# Patient Record
Sex: Male | Born: 1935 | Race: White | Hispanic: No | Smoking: Never smoker
Health system: Southern US, Community
[De-identification: ages and names within clinical notes are randomized; demographics above are authoritative.]

## PROBLEM LIST (undated history)

## (undated) DIAGNOSIS — D649 Anemia, unspecified: Secondary | ICD-10-CM

## (undated) DIAGNOSIS — I4891 Unspecified atrial fibrillation: Secondary | ICD-10-CM

## (undated) DIAGNOSIS — D696 Thrombocytopenia, unspecified: Secondary | ICD-10-CM

## (undated) DIAGNOSIS — I509 Heart failure, unspecified: Secondary | ICD-10-CM

## (undated) DIAGNOSIS — I2510 Atherosclerotic heart disease of native coronary artery without angina pectoris: Secondary | ICD-10-CM

## (undated) DIAGNOSIS — I219 Acute myocardial infarction, unspecified: Secondary | ICD-10-CM

## (undated) DIAGNOSIS — M25512 Pain in left shoulder: Secondary | ICD-10-CM

## (undated) DIAGNOSIS — R161 Splenomegaly, not elsewhere classified: Secondary | ICD-10-CM

## (undated) DIAGNOSIS — D631 Anemia in chronic kidney disease: Secondary | ICD-10-CM

## (undated) DIAGNOSIS — E669 Obesity, unspecified: Secondary | ICD-10-CM

## (undated) DIAGNOSIS — N189 Chronic kidney disease, unspecified: Secondary | ICD-10-CM

## (undated) DIAGNOSIS — I639 Cerebral infarction, unspecified: Secondary | ICD-10-CM

## (undated) DIAGNOSIS — I10 Essential (primary) hypertension: Secondary | ICD-10-CM

## (undated) DIAGNOSIS — E785 Hyperlipidemia, unspecified: Secondary | ICD-10-CM

## (undated) DIAGNOSIS — I350 Nonrheumatic aortic (valve) stenosis: Secondary | ICD-10-CM

## (undated) HISTORY — DX: Hyperlipidemia, unspecified: E78.5

## (undated) HISTORY — DX: Thrombocytopenia, unspecified: D69.6

## (undated) HISTORY — DX: Splenomegaly, not elsewhere classified: R16.1

## (undated) HISTORY — DX: Anemia in chronic kidney disease: D63.1

## (undated) HISTORY — DX: Obesity, unspecified: E66.9

## (undated) HISTORY — PX: OTHER SURGICAL HISTORY: SHX169

## (undated) HISTORY — PX: CORONARY ARTERY BYPASS GRAFT: SHX141

## (undated) HISTORY — DX: Heart failure, unspecified: I50.9

## (undated) HISTORY — DX: Cerebral infarction, unspecified: I63.9

## (undated) HISTORY — PX: APPENDECTOMY: SHX54

## (undated) HISTORY — DX: Acute myocardial infarction, unspecified: I21.9

## (undated) HISTORY — DX: Unspecified atrial fibrillation: I48.91

## (undated) HISTORY — DX: Essential (primary) hypertension: I10

## (undated) HISTORY — DX: Pain in left shoulder: M25.512

## (undated) HISTORY — DX: Chronic kidney disease, unspecified: N18.9

## (undated) HISTORY — PX: AORTIC VALVE REPLACEMENT: SHX41

## (undated) HISTORY — DX: Nonrheumatic aortic (valve) stenosis: I35.0

## (undated) HISTORY — DX: Anemia, unspecified: D64.9

## (undated) HISTORY — DX: Anemia in chronic kidney disease: N18.9

## (undated) HISTORY — DX: Atherosclerotic heart disease of native coronary artery without angina pectoris: I25.10

---

## 2004-06-18 ENCOUNTER — Emergency Department (HOSPITAL_COMMUNITY): Admission: EM | Admit: 2004-06-18 | Discharge: 2004-06-18 | Payer: Self-pay | Admitting: Emergency Medicine

## 2004-09-21 ENCOUNTER — Ambulatory Visit: Payer: Self-pay | Admitting: Family Medicine

## 2004-10-01 ENCOUNTER — Ambulatory Visit: Payer: Self-pay | Admitting: Family Medicine

## 2004-10-02 ENCOUNTER — Ambulatory Visit: Payer: Self-pay | Admitting: Family Medicine

## 2004-10-05 ENCOUNTER — Ambulatory Visit: Payer: Self-pay | Admitting: Family Medicine

## 2004-10-16 ENCOUNTER — Encounter: Admission: RE | Admit: 2004-10-16 | Discharge: 2004-10-16 | Payer: Self-pay | Admitting: Family Medicine

## 2005-05-16 ENCOUNTER — Ambulatory Visit: Payer: Self-pay | Admitting: Family Medicine

## 2005-07-05 ENCOUNTER — Inpatient Hospital Stay (HOSPITAL_COMMUNITY): Admission: AD | Admit: 2005-07-05 | Discharge: 2005-07-06 | Payer: Self-pay | Admitting: Internal Medicine

## 2005-07-05 ENCOUNTER — Ambulatory Visit: Payer: Self-pay | Admitting: Family Medicine

## 2005-07-06 ENCOUNTER — Ambulatory Visit: Payer: Self-pay | Admitting: Internal Medicine

## 2005-07-07 ENCOUNTER — Ambulatory Visit: Payer: Self-pay | Admitting: Cardiology

## 2005-07-10 ENCOUNTER — Ambulatory Visit: Payer: Self-pay | Admitting: Family Medicine

## 2005-07-17 ENCOUNTER — Ambulatory Visit: Payer: Self-pay

## 2005-08-06 ENCOUNTER — Ambulatory Visit (HOSPITAL_COMMUNITY): Admission: RE | Admit: 2005-08-06 | Discharge: 2005-08-06 | Payer: Self-pay | Admitting: Urology

## 2005-09-05 ENCOUNTER — Ambulatory Visit: Payer: Self-pay | Admitting: Gastroenterology

## 2005-09-16 ENCOUNTER — Ambulatory Visit: Payer: Self-pay | Admitting: Gastroenterology

## 2005-09-23 ENCOUNTER — Ambulatory Visit: Payer: Self-pay | Admitting: Gastroenterology

## 2005-09-23 ENCOUNTER — Encounter (INDEPENDENT_AMBULATORY_CARE_PROVIDER_SITE_OTHER): Payer: Self-pay | Admitting: *Deleted

## 2005-09-23 ENCOUNTER — Ambulatory Visit (HOSPITAL_COMMUNITY): Admission: RE | Admit: 2005-09-23 | Discharge: 2005-09-23 | Payer: Self-pay | Admitting: Gastroenterology

## 2005-11-13 ENCOUNTER — Ambulatory Visit: Payer: Self-pay | Admitting: Family Medicine

## 2006-03-25 ENCOUNTER — Ambulatory Visit: Payer: Self-pay | Admitting: Family Medicine

## 2006-04-28 ENCOUNTER — Ambulatory Visit: Payer: Self-pay | Admitting: Gastroenterology

## 2006-05-09 ENCOUNTER — Ambulatory Visit: Payer: Self-pay | Admitting: Gastroenterology

## 2006-05-09 ENCOUNTER — Encounter (INDEPENDENT_AMBULATORY_CARE_PROVIDER_SITE_OTHER): Payer: Self-pay | Admitting: *Deleted

## 2006-05-09 ENCOUNTER — Encounter: Payer: Self-pay | Admitting: Gastroenterology

## 2006-05-13 ENCOUNTER — Ambulatory Visit: Payer: Self-pay | Admitting: Cardiology

## 2006-05-16 ENCOUNTER — Ambulatory Visit: Payer: Self-pay

## 2006-05-16 ENCOUNTER — Encounter: Payer: Self-pay | Admitting: Internal Medicine

## 2006-05-17 ENCOUNTER — Emergency Department (HOSPITAL_COMMUNITY): Admission: EM | Admit: 2006-05-17 | Discharge: 2006-05-18 | Payer: Self-pay | Admitting: Emergency Medicine

## 2006-06-05 ENCOUNTER — Ambulatory Visit: Payer: Self-pay | Admitting: Family Medicine

## 2006-06-09 ENCOUNTER — Ambulatory Visit: Payer: Self-pay | Admitting: Family Medicine

## 2006-06-17 ENCOUNTER — Ambulatory Visit: Payer: Self-pay | Admitting: Family Medicine

## 2006-06-18 ENCOUNTER — Ambulatory Visit: Payer: Self-pay | Admitting: Family Medicine

## 2006-06-19 ENCOUNTER — Ambulatory Visit: Payer: Self-pay | Admitting: Family Medicine

## 2006-07-14 ENCOUNTER — Ambulatory Visit: Payer: Self-pay | Admitting: Family Medicine

## 2006-07-23 ENCOUNTER — Ambulatory Visit: Payer: Self-pay | Admitting: Internal Medicine

## 2006-08-20 ENCOUNTER — Ambulatory Visit: Payer: Self-pay | Admitting: Family Medicine

## 2006-10-28 ENCOUNTER — Ambulatory Visit: Payer: Self-pay

## 2006-10-28 ENCOUNTER — Ambulatory Visit: Payer: Self-pay | Admitting: Family Medicine

## 2006-10-28 LAB — CONVERTED CEMR LAB
Albumin: 4.1 g/dL (ref 3.5–5.2)
Alkaline Phosphatase: 78 units/L (ref 39–117)
BUN: 23 mg/dL (ref 6–23)
Basophils Absolute: 0 10*3/uL (ref 0.0–0.1)
Basophils Relative: 0.1 % (ref 0.0–1.0)
Calcium: 10 mg/dL (ref 8.4–10.5)
Chloride: 105 meq/L (ref 96–112)
Creatinine, Ser: 1.7 mg/dL — ABNORMAL HIGH (ref 0.4–1.5)
HCT: 39.8 % (ref 39.0–52.0)
HDL: 43.8 mg/dL (ref >39.0)
Hgb A1c MFr Bld: 5.9 % (ref 4.6–6.0)
LDL DIRECT: 102.8 mg/dL
MCHC: 33.8 g/dL (ref 30.0–36.0)
Monocytes Relative: 6.2 % (ref 3.0–11.0)
Neutro Abs: 7.8 10*3/uL — ABNORMAL HIGH (ref 1.4–7.7)
Platelets: 157 10*3/uL (ref 150–400)
Potassium: 5 meq/L (ref 3.5–5.1)
RBC: 4.33 M/uL (ref 4.22–5.81)
Sodium: 145 meq/L (ref 135–145)
TSH: 1.47 microintl units/mL (ref 0.35–5.50)
Total Bilirubin: 1.4 mg/dL — ABNORMAL HIGH (ref 0.3–1.2)
VLDL: 44 mg/dL — ABNORMAL HIGH (ref 0–40)

## 2006-10-30 ENCOUNTER — Ambulatory Visit: Payer: Self-pay | Admitting: Cardiology

## 2006-11-28 ENCOUNTER — Ambulatory Visit: Payer: Self-pay | Admitting: Family Medicine

## 2006-12-16 ENCOUNTER — Ambulatory Visit: Payer: Self-pay | Admitting: Cardiology

## 2006-12-24 ENCOUNTER — Ambulatory Visit: Payer: Self-pay | Admitting: Family Medicine

## 2006-12-24 LAB — CONVERTED CEMR LAB
Calcium: 9.5 mg/dL (ref 8.4–10.5)
Chloride: 101 meq/L (ref 96–112)
Creatinine, Ser: 1.5 mg/dL (ref 0.4–1.5)
GFR calc Af Amer: 60 mL/min
GFR calc non Af Amer: 49 mL/min
Glucose, Bld: 123 mg/dL — ABNORMAL HIGH (ref 70–99)
Potassium: 4.5 meq/L (ref 3.5–5.1)
Sodium: 139 meq/L (ref 135–145)

## 2007-01-26 ENCOUNTER — Ambulatory Visit: Payer: Self-pay | Admitting: Family Medicine

## 2007-04-13 DIAGNOSIS — Z860100 Personal history of colon polyps, unspecified: Secondary | ICD-10-CM | POA: Insufficient documentation

## 2007-04-13 DIAGNOSIS — Z951 Presence of aortocoronary bypass graft: Secondary | ICD-10-CM

## 2007-04-13 DIAGNOSIS — I080 Rheumatic disorders of both mitral and aortic valves: Secondary | ICD-10-CM

## 2007-04-13 DIAGNOSIS — M159 Polyosteoarthritis, unspecified: Secondary | ICD-10-CM

## 2007-04-13 DIAGNOSIS — M1990 Unspecified osteoarthritis, unspecified site: Secondary | ICD-10-CM

## 2007-04-13 DIAGNOSIS — I10 Essential (primary) hypertension: Secondary | ICD-10-CM | POA: Insufficient documentation

## 2007-04-13 DIAGNOSIS — E785 Hyperlipidemia, unspecified: Secondary | ICD-10-CM | POA: Insufficient documentation

## 2007-04-13 HISTORY — DX: Polyosteoarthritis, unspecified: M15.9

## 2007-06-17 ENCOUNTER — Ambulatory Visit: Payer: Self-pay | Admitting: Family Medicine

## 2007-06-17 DIAGNOSIS — F331 Major depressive disorder, recurrent, moderate: Secondary | ICD-10-CM | POA: Insufficient documentation

## 2007-06-17 LAB — CONVERTED CEMR LAB
Bilirubin Urine: NEGATIVE
Glucose, Urine, Semiquant: NEGATIVE
Nitrite: NEGATIVE
Protein, U semiquant: NEGATIVE
Specific Gravity, Urine: 1.01
Urobilinogen, UA: 0.2
WBC Urine, dipstick: NEGATIVE
pH: 6

## 2007-07-08 ENCOUNTER — Ambulatory Visit: Payer: Self-pay | Admitting: Family Medicine

## 2007-07-15 ENCOUNTER — Ambulatory Visit: Payer: Self-pay | Admitting: Cardiology

## 2007-07-15 ENCOUNTER — Telehealth (INDEPENDENT_AMBULATORY_CARE_PROVIDER_SITE_OTHER): Payer: Self-pay | Admitting: *Deleted

## 2007-07-15 ENCOUNTER — Ambulatory Visit: Payer: Self-pay

## 2007-09-01 ENCOUNTER — Telehealth: Payer: Self-pay | Admitting: Family Medicine

## 2008-01-04 ENCOUNTER — Telehealth: Payer: Self-pay | Admitting: Family Medicine

## 2008-03-07 ENCOUNTER — Ambulatory Visit: Payer: Self-pay | Admitting: Family Medicine

## 2008-03-07 DIAGNOSIS — E1129 Type 2 diabetes mellitus with other diabetic kidney complication: Secondary | ICD-10-CM

## 2008-03-17 ENCOUNTER — Encounter: Payer: Self-pay | Admitting: Family Medicine

## 2008-04-04 ENCOUNTER — Ambulatory Visit: Payer: Self-pay | Admitting: Cardiology

## 2008-04-11 ENCOUNTER — Telehealth: Payer: Self-pay | Admitting: Family Medicine

## 2008-04-12 ENCOUNTER — Encounter: Payer: Self-pay | Admitting: Cardiology

## 2008-04-12 ENCOUNTER — Ambulatory Visit: Payer: Self-pay

## 2008-04-20 ENCOUNTER — Ambulatory Visit: Payer: Self-pay | Admitting: Family Medicine

## 2008-04-20 DIAGNOSIS — M109 Gout, unspecified: Secondary | ICD-10-CM

## 2008-04-20 DIAGNOSIS — I509 Heart failure, unspecified: Secondary | ICD-10-CM | POA: Insufficient documentation

## 2008-05-24 ENCOUNTER — Ambulatory Visit: Payer: Self-pay | Admitting: Family Medicine

## 2008-05-26 ENCOUNTER — Telehealth: Payer: Self-pay | Admitting: Family Medicine

## 2008-06-07 ENCOUNTER — Ambulatory Visit: Payer: Self-pay | Admitting: Cardiology

## 2008-06-28 ENCOUNTER — Telehealth: Payer: Self-pay | Admitting: Family Medicine

## 2008-07-29 ENCOUNTER — Encounter: Payer: Self-pay | Admitting: Family Medicine

## 2008-07-29 DIAGNOSIS — M129 Arthropathy, unspecified: Secondary | ICD-10-CM | POA: Insufficient documentation

## 2008-08-25 ENCOUNTER — Encounter: Payer: Self-pay | Admitting: Family Medicine

## 2008-09-08 ENCOUNTER — Encounter: Payer: Self-pay | Admitting: Family Medicine

## 2008-09-12 ENCOUNTER — Ambulatory Visit: Payer: Self-pay | Admitting: Family Medicine

## 2008-09-12 DIAGNOSIS — N259 Disorder resulting from impaired renal tubular function, unspecified: Secondary | ICD-10-CM | POA: Insufficient documentation

## 2008-09-13 LAB — CONVERTED CEMR LAB
ALT: 36 units/L (ref 0–53)
AST: 30 units/L (ref 0–37)
Albumin: 4.3 g/dL (ref 3.5–5.2)
BUN: 28 mg/dL — ABNORMAL HIGH (ref 6–23)
Bilirubin, Direct: 0.1 mg/dL (ref 0.0–0.3)
CO2: 26 meq/L (ref 19–32)
Creatinine, Ser: 1.6 mg/dL — ABNORMAL HIGH (ref 0.4–1.5)
Direct LDL: 93.8 mg/dL
GFR calc Af Amer: 55 mL/min
GFR calc non Af Amer: 46 mL/min
Hgb A1c MFr Bld: 6.7 % — ABNORMAL HIGH (ref 4.6–6.0)
Potassium: 4.4 meq/L (ref 3.5–5.1)
Total CHOL/HDL Ratio: 4.6
Triglycerides: 294 mg/dL (ref 0–149)

## 2008-09-16 ENCOUNTER — Telehealth: Payer: Self-pay | Admitting: Family Medicine

## 2008-09-20 ENCOUNTER — Encounter: Payer: Self-pay | Admitting: Cardiology

## 2008-09-20 ENCOUNTER — Ambulatory Visit: Payer: Self-pay

## 2008-09-23 ENCOUNTER — Ambulatory Visit: Payer: Self-pay | Admitting: Cardiology

## 2008-10-03 ENCOUNTER — Ambulatory Visit: Payer: Self-pay | Admitting: Family Medicine

## 2008-11-10 ENCOUNTER — Encounter: Payer: Self-pay | Admitting: Family Medicine

## 2008-12-13 ENCOUNTER — Telehealth: Payer: Self-pay | Admitting: Family Medicine

## 2009-01-02 ENCOUNTER — Ambulatory Visit: Payer: Self-pay | Admitting: Family Medicine

## 2009-01-05 ENCOUNTER — Telehealth: Payer: Self-pay | Admitting: Family Medicine

## 2009-01-05 LAB — CONVERTED CEMR LAB
ALT: 44 units/L (ref 0–53)
Alkaline Phosphatase: 38 units/L — ABNORMAL LOW (ref 39–117)
Basophils Absolute: 0 10*3/uL (ref 0.0–0.1)
Basophils Relative: 0 % (ref 0.0–3.0)
Bilirubin, Direct: 0.2 mg/dL (ref 0.0–0.3)
CO2: 31 meq/L (ref 19–32)
Chloride: 100 meq/L (ref 96–112)
Cholesterol: 196 mg/dL (ref 0–200)
Creatinine, Ser: 2.1 mg/dL — ABNORMAL HIGH (ref 0.4–1.5)
HCT: 38.4 % — ABNORMAL LOW (ref 39.0–52.0)
HDL: 42.3 mg/dL (ref >39.0)
Hemoglobin: 13.3 g/dL (ref 13.0–17.0)
Hgb A1c MFr Bld: 6.2 % — ABNORMAL HIGH (ref 4.6–6.0)
Lymphocytes Relative: 15 % (ref 12.0–46.0)
Neutro Abs: 5 10*3/uL (ref 1.4–7.7)
Platelets: 152 10*3/uL (ref 150–400)
Total Bilirubin: 1 mg/dL (ref 0.3–1.2)
Total CHOL/HDL Ratio: 4.6
Triglycerides: 258 mg/dL (ref 0–149)
VLDL: 52 mg/dL — ABNORMAL HIGH (ref 0–40)
WBC: 6.8 10*3/uL (ref 4.5–10.5)

## 2009-03-07 ENCOUNTER — Ambulatory Visit: Payer: Self-pay | Admitting: Family Medicine

## 2009-03-22 ENCOUNTER — Encounter (INDEPENDENT_AMBULATORY_CARE_PROVIDER_SITE_OTHER): Payer: Self-pay | Admitting: *Deleted

## 2009-03-28 ENCOUNTER — Ambulatory Visit: Payer: Self-pay | Admitting: Family Medicine

## 2009-03-28 DIAGNOSIS — H6120 Impacted cerumen, unspecified ear: Secondary | ICD-10-CM

## 2009-03-28 LAB — CONVERTED CEMR LAB
Specific Gravity, Urine: 1.02
pH: 6

## 2009-03-29 ENCOUNTER — Encounter: Payer: Self-pay | Admitting: Family Medicine

## 2009-04-04 ENCOUNTER — Telehealth: Payer: Self-pay | Admitting: Family Medicine

## 2009-05-01 ENCOUNTER — Ambulatory Visit: Payer: Self-pay | Admitting: Family Medicine

## 2009-05-01 LAB — CONVERTED CEMR LAB
Bilirubin Urine: NEGATIVE
Glucose, Urine, Semiquant: NEGATIVE
Ketones, urine, test strip: NEGATIVE
Specific Gravity, Urine: 1.015
Urobilinogen, UA: 0.2
WBC Urine, dipstick: NEGATIVE
pH: 6

## 2009-05-02 ENCOUNTER — Telehealth: Payer: Self-pay | Admitting: Cardiology

## 2009-05-02 DIAGNOSIS — I359 Nonrheumatic aortic valve disorder, unspecified: Secondary | ICD-10-CM | POA: Insufficient documentation

## 2009-05-04 ENCOUNTER — Encounter: Payer: Self-pay | Admitting: Cardiology

## 2009-05-04 ENCOUNTER — Telehealth: Payer: Self-pay | Admitting: Cardiology

## 2009-05-04 ENCOUNTER — Ambulatory Visit: Payer: Self-pay

## 2009-05-09 ENCOUNTER — Ambulatory Visit: Payer: Self-pay | Admitting: Family Medicine

## 2009-05-23 ENCOUNTER — Ambulatory Visit: Payer: Self-pay | Admitting: Family Medicine

## 2009-05-23 LAB — CONVERTED CEMR LAB
Bilirubin Urine: NEGATIVE
Nitrite: NEGATIVE
Specific Gravity, Urine: 1.01
Urobilinogen, UA: 0.2
WBC Urine, dipstick: NEGATIVE
pH: 6

## 2009-05-24 ENCOUNTER — Encounter: Payer: Self-pay | Admitting: Family Medicine

## 2009-06-06 ENCOUNTER — Ambulatory Visit: Payer: Self-pay

## 2009-06-06 ENCOUNTER — Encounter: Payer: Self-pay | Admitting: Cardiology

## 2009-07-24 ENCOUNTER — Telehealth: Payer: Self-pay | Admitting: Family Medicine

## 2009-09-20 ENCOUNTER — Telehealth: Payer: Self-pay | Admitting: Family Medicine

## 2009-10-12 ENCOUNTER — Encounter: Payer: Self-pay | Admitting: Family Medicine

## 2009-10-16 ENCOUNTER — Ambulatory Visit: Payer: Self-pay | Admitting: Family Medicine

## 2009-10-16 DIAGNOSIS — J309 Allergic rhinitis, unspecified: Secondary | ICD-10-CM | POA: Insufficient documentation

## 2009-10-16 DIAGNOSIS — K589 Irritable bowel syndrome without diarrhea: Secondary | ICD-10-CM | POA: Insufficient documentation

## 2009-10-21 DIAGNOSIS — D696 Thrombocytopenia, unspecified: Secondary | ICD-10-CM

## 2009-10-21 HISTORY — DX: Thrombocytopenia, unspecified: D69.6

## 2009-10-26 ENCOUNTER — Encounter: Payer: Self-pay | Admitting: Cardiology

## 2009-10-26 ENCOUNTER — Encounter: Payer: Self-pay | Admitting: Family Medicine

## 2009-11-02 ENCOUNTER — Ambulatory Visit: Payer: Self-pay | Admitting: Family Medicine

## 2009-11-20 ENCOUNTER — Ambulatory Visit: Payer: Self-pay | Admitting: Family Medicine

## 2009-11-20 LAB — CONVERTED CEMR LAB: Blood Glucose, Fingerstick: 478

## 2009-11-21 ENCOUNTER — Telehealth (INDEPENDENT_AMBULATORY_CARE_PROVIDER_SITE_OTHER): Payer: Self-pay | Admitting: *Deleted

## 2009-11-22 ENCOUNTER — Encounter: Admission: RE | Admit: 2009-11-22 | Discharge: 2009-11-22 | Payer: Self-pay | Admitting: Family Medicine

## 2009-11-22 ENCOUNTER — Encounter: Payer: Self-pay | Admitting: Family Medicine

## 2009-11-24 ENCOUNTER — Telehealth: Payer: Self-pay | Admitting: Gastroenterology

## 2009-11-27 ENCOUNTER — Ambulatory Visit: Payer: Self-pay | Admitting: Family Medicine

## 2009-12-07 ENCOUNTER — Ambulatory Visit: Payer: Self-pay | Admitting: Family Medicine

## 2009-12-07 LAB — CONVERTED CEMR LAB
Ketones, urine, test strip: NEGATIVE
Urobilinogen, UA: 0.2

## 2009-12-08 ENCOUNTER — Telehealth: Payer: Self-pay | Admitting: Family Medicine

## 2009-12-11 LAB — CONVERTED CEMR LAB
ALT: 22 units/L (ref 0–53)
Albumin: 3.8 g/dL (ref 3.5–5.2)
Alkaline Phosphatase: 41 units/L (ref 39–117)
Basophils Absolute: 0 10*3/uL (ref 0.0–0.1)
Bilirubin, Direct: 0.3 mg/dL (ref 0.0–0.3)
Calcium: 9.2 mg/dL (ref 8.4–10.5)
Creatinine, Ser: 2.3 mg/dL — ABNORMAL HIGH (ref 0.4–1.5)
HCT: 32.7 % — ABNORMAL LOW (ref 39.0–52.0)
Hgb A1c MFr Bld: 10.1 % — ABNORMAL HIGH (ref 4.6–6.5)
Lymphocytes Relative: 11.8 % — ABNORMAL LOW (ref 12.0–46.0)
Lymphs Abs: 0.8 10*3/uL (ref 0.7–4.0)
Microalb, Ur: 15.1 mg/dL — ABNORMAL HIGH (ref 0.0–1.9)
Monocytes Absolute: 0.5 10*3/uL (ref 0.1–1.0)
Neutrophils Relative %: 77.6 % — ABNORMAL HIGH (ref 43.0–77.0)
RDW: 15.3 % — ABNORMAL HIGH (ref 11.5–14.6)
TSH: 1.88 microintl units/mL (ref 0.35–5.50)
Total CHOL/HDL Ratio: 3
Triglycerides: 167 mg/dL — ABNORMAL HIGH (ref 0.0–149.0)
VLDL: 33.4 mg/dL (ref 0.0–40.0)
WBC: 6.8 10*3/uL (ref 4.5–10.5)

## 2009-12-13 ENCOUNTER — Ambulatory Visit: Payer: Self-pay | Admitting: Family Medicine

## 2009-12-14 ENCOUNTER — Ambulatory Visit: Payer: Self-pay | Admitting: Internal Medicine

## 2009-12-14 ENCOUNTER — Inpatient Hospital Stay (HOSPITAL_COMMUNITY): Admission: EM | Admit: 2009-12-14 | Discharge: 2009-12-22 | Payer: Self-pay | Admitting: Emergency Medicine

## 2009-12-14 ENCOUNTER — Telehealth: Payer: Self-pay | Admitting: Family Medicine

## 2009-12-14 ENCOUNTER — Ambulatory Visit: Payer: Self-pay | Admitting: Cardiovascular Disease

## 2009-12-14 ENCOUNTER — Encounter (INDEPENDENT_AMBULATORY_CARE_PROVIDER_SITE_OTHER): Payer: Self-pay | Admitting: *Deleted

## 2009-12-15 ENCOUNTER — Encounter (INDEPENDENT_AMBULATORY_CARE_PROVIDER_SITE_OTHER): Payer: Self-pay | Admitting: Internal Medicine

## 2009-12-18 ENCOUNTER — Encounter (INDEPENDENT_AMBULATORY_CARE_PROVIDER_SITE_OTHER): Payer: Self-pay | Admitting: *Deleted

## 2009-12-26 ENCOUNTER — Ambulatory Visit: Payer: Self-pay | Admitting: Family Medicine

## 2009-12-26 DIAGNOSIS — K759 Inflammatory liver disease, unspecified: Secondary | ICD-10-CM | POA: Insufficient documentation

## 2009-12-27 LAB — CONVERTED CEMR LAB
AST: 69 units/L — ABNORMAL HIGH (ref 0–37)
BUN: 22 mg/dL (ref 6–23)
CO2: 23 meq/L (ref 19–32)
Chloride: 110 meq/L (ref 96–112)
Eosinophils Relative: 2.8 % (ref 0.0–5.0)
Glucose, Bld: 153 mg/dL — ABNORMAL HIGH (ref 70–99)
HCT: 35.8 % — ABNORMAL LOW (ref 39.0–52.0)
Hemoglobin: 11.9 g/dL — ABNORMAL LOW (ref 13.0–17.0)
Lymphocytes Relative: 12.8 % (ref 12.0–46.0)
Lymphs Abs: 0.9 10*3/uL (ref 0.7–4.0)
MCV: 95.8 fL (ref 78.0–100.0)
Monocytes Relative: 7.9 % (ref 3.0–12.0)
Neutro Abs: 5.1 10*3/uL (ref 1.4–7.7)
Neutrophils Relative %: 76.3 % (ref 43.0–77.0)
Platelets: 241 10*3/uL (ref 150.0–400.0)
RDW: 18.3 % — ABNORMAL HIGH (ref 11.5–14.6)
Sodium: 140 meq/L (ref 135–145)
Total Bilirubin: 1.6 mg/dL — ABNORMAL HIGH (ref 0.3–1.2)
Total Protein: 7 g/dL (ref 6.0–8.3)

## 2010-01-05 ENCOUNTER — Ambulatory Visit: Payer: Self-pay | Admitting: Family Medicine

## 2010-01-05 ENCOUNTER — Telehealth: Payer: Self-pay | Admitting: Family Medicine

## 2010-01-08 ENCOUNTER — Emergency Department (HOSPITAL_COMMUNITY): Admission: EM | Admit: 2010-01-08 | Discharge: 2010-01-08 | Payer: Self-pay | Admitting: Emergency Medicine

## 2010-01-08 ENCOUNTER — Telehealth: Payer: Self-pay | Admitting: Family Medicine

## 2010-01-09 ENCOUNTER — Encounter: Payer: Self-pay | Admitting: Family Medicine

## 2010-01-10 ENCOUNTER — Ambulatory Visit: Payer: Self-pay | Admitting: Internal Medicine

## 2010-01-10 ENCOUNTER — Telehealth: Payer: Self-pay | Admitting: Gastroenterology

## 2010-01-10 ENCOUNTER — Telehealth: Payer: Self-pay | Admitting: Family Medicine

## 2010-01-10 DIAGNOSIS — R42 Dizziness and giddiness: Secondary | ICD-10-CM | POA: Insufficient documentation

## 2010-01-11 LAB — CONVERTED CEMR LAB
ALT: 23 units/L (ref 0–53)
AST: 45 units/L — ABNORMAL HIGH (ref 0–37)
Albumin: 3.6 g/dL (ref 3.5–5.2)
Ammonia: 19 umol/L (ref 11–35)
Creatinine, Ser: 1.9 mg/dL — ABNORMAL HIGH (ref 0.4–1.5)
GFR calc non Af Amer: 37.28 mL/min (ref >60)
Prothrombin Time: 14.9 s — ABNORMAL HIGH (ref 9.1–11.7)
Sodium: 139 meq/L (ref 135–145)

## 2010-01-12 ENCOUNTER — Encounter: Payer: Self-pay | Admitting: Cardiology

## 2010-01-15 ENCOUNTER — Telehealth: Payer: Self-pay | Admitting: Family Medicine

## 2010-01-15 ENCOUNTER — Ambulatory Visit: Payer: Self-pay | Admitting: Cardiology

## 2010-01-30 ENCOUNTER — Ambulatory Visit: Payer: Self-pay | Admitting: Gastroenterology

## 2010-01-31 LAB — CONVERTED CEMR LAB
AST: 26 units/L (ref 0–37)
Albumin: 3.6 g/dL (ref 3.5–5.2)
Bilirubin, Direct: 0.6 mg/dL — ABNORMAL HIGH (ref 0.0–0.3)
Chloride: 99 meq/L (ref 96–112)
Creatinine, Ser: 1.3 mg/dL (ref 0.4–1.5)
GFR calc non Af Amer: 57.75 mL/min (ref >60)
Potassium: 3.4 meq/L — ABNORMAL LOW (ref 3.5–5.1)
Sed Rate: 19 mm/hr (ref 0–22)
Total Protein: 6.4 g/dL (ref 6.0–8.3)

## 2010-02-06 ENCOUNTER — Ambulatory Visit (HOSPITAL_COMMUNITY): Admission: RE | Admit: 2010-02-06 | Discharge: 2010-02-06 | Payer: Self-pay | Admitting: Gastroenterology

## 2010-02-07 ENCOUNTER — Encounter (INDEPENDENT_AMBULATORY_CARE_PROVIDER_SITE_OTHER): Payer: Self-pay | Admitting: *Deleted

## 2010-02-19 ENCOUNTER — Telehealth: Payer: Self-pay | Admitting: Family Medicine

## 2010-02-20 ENCOUNTER — Ambulatory Visit: Payer: Self-pay | Admitting: Gastroenterology

## 2010-02-26 ENCOUNTER — Encounter (INDEPENDENT_AMBULATORY_CARE_PROVIDER_SITE_OTHER): Payer: Self-pay | Admitting: *Deleted

## 2010-02-26 ENCOUNTER — Ambulatory Visit: Payer: Self-pay | Admitting: Cardiology

## 2010-03-07 ENCOUNTER — Telehealth: Payer: Self-pay | Admitting: Cardiology

## 2010-03-07 ENCOUNTER — Ambulatory Visit: Payer: Self-pay | Admitting: Family Medicine

## 2010-03-12 ENCOUNTER — Telehealth: Payer: Self-pay | Admitting: Gastroenterology

## 2010-03-16 ENCOUNTER — Telehealth: Payer: Self-pay | Admitting: Family Medicine

## 2010-03-18 ENCOUNTER — Ambulatory Visit: Payer: Self-pay | Admitting: Cardiovascular Disease

## 2010-03-18 ENCOUNTER — Inpatient Hospital Stay (HOSPITAL_COMMUNITY): Admission: EM | Admit: 2010-03-18 | Discharge: 2010-04-24 | Payer: Self-pay | Admitting: Emergency Medicine

## 2010-03-18 ENCOUNTER — Encounter (INDEPENDENT_AMBULATORY_CARE_PROVIDER_SITE_OTHER): Payer: Self-pay | Admitting: Internal Medicine

## 2010-03-20 ENCOUNTER — Encounter: Payer: Self-pay | Admitting: Cardiology

## 2010-03-20 ENCOUNTER — Telehealth (INDEPENDENT_AMBULATORY_CARE_PROVIDER_SITE_OTHER): Payer: Self-pay | Admitting: *Deleted

## 2010-03-20 ENCOUNTER — Telehealth: Payer: Self-pay | Admitting: Family Medicine

## 2010-03-21 ENCOUNTER — Encounter (INDEPENDENT_AMBULATORY_CARE_PROVIDER_SITE_OTHER): Payer: Self-pay | Admitting: Nephrology

## 2010-03-21 ENCOUNTER — Telehealth: Payer: Self-pay | Admitting: Cardiology

## 2010-03-26 ENCOUNTER — Ambulatory Visit: Payer: Self-pay | Admitting: Thoracic Surgery (Cardiothoracic Vascular Surgery)

## 2010-04-03 ENCOUNTER — Ambulatory Visit: Payer: Self-pay | Admitting: Hematology and Oncology

## 2010-04-04 ENCOUNTER — Encounter: Payer: Self-pay | Admitting: Cardiology

## 2010-04-04 ENCOUNTER — Encounter: Payer: Self-pay | Admitting: Thoracic Surgery (Cardiothoracic Vascular Surgery)

## 2010-04-10 ENCOUNTER — Encounter: Payer: Self-pay | Admitting: Vascular Surgery

## 2010-04-11 ENCOUNTER — Ambulatory Visit: Payer: Self-pay | Admitting: Physical Medicine & Rehabilitation

## 2010-04-18 ENCOUNTER — Ambulatory Visit: Payer: Self-pay | Admitting: Vascular Surgery

## 2010-04-19 ENCOUNTER — Encounter: Payer: Self-pay | Admitting: Vascular Surgery

## 2010-05-14 ENCOUNTER — Ambulatory Visit: Payer: Self-pay | Admitting: Thoracic Surgery (Cardiothoracic Vascular Surgery)

## 2010-05-14 ENCOUNTER — Encounter
Admission: RE | Admit: 2010-05-14 | Discharge: 2010-05-14 | Payer: Self-pay | Admitting: Thoracic Surgery (Cardiothoracic Vascular Surgery)

## 2010-05-14 ENCOUNTER — Encounter: Payer: Self-pay | Admitting: Cardiology

## 2010-05-15 ENCOUNTER — Encounter: Payer: Self-pay | Admitting: Cardiology

## 2010-05-15 ENCOUNTER — Ambulatory Visit: Payer: Self-pay | Admitting: Cardiology

## 2010-05-17 ENCOUNTER — Telehealth: Payer: Self-pay | Admitting: Cardiology

## 2010-05-17 ENCOUNTER — Encounter: Payer: Self-pay | Admitting: Cardiology

## 2010-05-17 ENCOUNTER — Encounter: Payer: Self-pay | Admitting: Family Medicine

## 2010-05-18 ENCOUNTER — Encounter: Payer: Self-pay | Admitting: Cardiology

## 2010-05-18 ENCOUNTER — Encounter: Payer: Self-pay | Admitting: Family Medicine

## 2010-05-19 ENCOUNTER — Telehealth: Payer: Self-pay | Admitting: Cardiology

## 2010-05-21 ENCOUNTER — Encounter: Payer: Self-pay | Admitting: Cardiovascular Disease

## 2010-05-21 ENCOUNTER — Encounter: Payer: Self-pay | Admitting: Cardiology

## 2010-05-21 ENCOUNTER — Telehealth: Payer: Self-pay | Admitting: Family Medicine

## 2010-05-21 ENCOUNTER — Telehealth: Payer: Self-pay | Admitting: Cardiology

## 2010-05-21 DIAGNOSIS — I639 Cerebral infarction, unspecified: Secondary | ICD-10-CM

## 2010-05-21 HISTORY — DX: Cerebral infarction, unspecified: I63.9

## 2010-05-21 LAB — CONVERTED CEMR LAB: Prothrombin Time: 25.9 s

## 2010-05-24 ENCOUNTER — Ambulatory Visit: Payer: Self-pay | Admitting: Family Medicine

## 2010-05-25 ENCOUNTER — Telehealth: Payer: Self-pay | Admitting: Family Medicine

## 2010-05-28 ENCOUNTER — Encounter: Payer: Self-pay | Admitting: Family Medicine

## 2010-05-28 ENCOUNTER — Encounter: Payer: Self-pay | Admitting: Cardiology

## 2010-05-29 ENCOUNTER — Telehealth (INDEPENDENT_AMBULATORY_CARE_PROVIDER_SITE_OTHER): Payer: Self-pay | Admitting: *Deleted

## 2010-05-29 ENCOUNTER — Telehealth: Payer: Self-pay | Admitting: Family Medicine

## 2010-05-30 ENCOUNTER — Encounter: Payer: Self-pay | Admitting: Cardiology

## 2010-05-30 LAB — CONVERTED CEMR LAB: Prothrombin Time: 36.9 s

## 2010-05-31 ENCOUNTER — Encounter: Payer: Self-pay | Admitting: Physician Assistant

## 2010-06-01 ENCOUNTER — Telehealth: Payer: Self-pay | Admitting: Cardiology

## 2010-06-02 ENCOUNTER — Encounter: Payer: Self-pay | Admitting: Cardiology

## 2010-06-05 ENCOUNTER — Encounter: Payer: Self-pay | Admitting: Physician Assistant

## 2010-06-05 ENCOUNTER — Ambulatory Visit: Payer: Self-pay | Admitting: Cardiology

## 2010-06-06 ENCOUNTER — Encounter: Payer: Self-pay | Admitting: Internal Medicine

## 2010-06-07 ENCOUNTER — Ambulatory Visit: Payer: Self-pay | Admitting: Vascular Surgery

## 2010-06-08 ENCOUNTER — Encounter: Payer: Self-pay | Admitting: Cardiology

## 2010-06-12 ENCOUNTER — Telehealth: Payer: Self-pay | Admitting: Family Medicine

## 2010-06-13 ENCOUNTER — Encounter: Payer: Self-pay | Admitting: Family Medicine

## 2010-06-13 ENCOUNTER — Encounter: Payer: Self-pay | Admitting: Cardiology

## 2010-06-14 ENCOUNTER — Telehealth (INDEPENDENT_AMBULATORY_CARE_PROVIDER_SITE_OTHER): Payer: Self-pay | Admitting: *Deleted

## 2010-06-14 ENCOUNTER — Ambulatory Visit: Payer: Self-pay | Admitting: Internal Medicine

## 2010-06-14 ENCOUNTER — Inpatient Hospital Stay (HOSPITAL_COMMUNITY): Admission: EM | Admit: 2010-06-14 | Discharge: 2010-06-16 | Payer: Self-pay | Admitting: Emergency Medicine

## 2010-06-15 ENCOUNTER — Encounter (INDEPENDENT_AMBULATORY_CARE_PROVIDER_SITE_OTHER): Payer: Self-pay | Admitting: Family Medicine

## 2010-06-15 ENCOUNTER — Ambulatory Visit: Payer: Self-pay | Admitting: Vascular Surgery

## 2010-06-18 ENCOUNTER — Encounter: Payer: Self-pay | Admitting: Cardiology

## 2010-06-18 ENCOUNTER — Telehealth: Payer: Self-pay | Admitting: Cardiology

## 2010-06-19 ENCOUNTER — Telehealth: Payer: Self-pay | Admitting: Family Medicine

## 2010-06-19 ENCOUNTER — Ambulatory Visit: Payer: Self-pay | Admitting: Cardiovascular Disease

## 2010-06-19 ENCOUNTER — Telehealth: Payer: Self-pay | Admitting: Cardiology

## 2010-06-19 LAB — CONVERTED CEMR LAB: POC INR: 4.1

## 2010-06-20 ENCOUNTER — Encounter: Payer: Self-pay | Admitting: Cardiology

## 2010-06-20 ENCOUNTER — Telehealth: Payer: Self-pay | Admitting: Family Medicine

## 2010-06-22 ENCOUNTER — Encounter: Payer: Self-pay | Admitting: Cardiology

## 2010-06-26 ENCOUNTER — Telehealth: Payer: Self-pay | Admitting: Family Medicine

## 2010-06-28 ENCOUNTER — Ambulatory Visit: Payer: Self-pay | Admitting: Internal Medicine

## 2010-07-05 ENCOUNTER — Encounter: Payer: Self-pay | Admitting: Family Medicine

## 2010-07-06 ENCOUNTER — Encounter: Payer: Self-pay | Admitting: Cardiology

## 2010-07-10 ENCOUNTER — Ambulatory Visit: Payer: Self-pay | Admitting: Cardiology

## 2010-07-12 ENCOUNTER — Encounter
Admission: RE | Admit: 2010-07-12 | Discharge: 2010-07-12 | Payer: Self-pay | Admitting: Thoracic Surgery (Cardiothoracic Vascular Surgery)

## 2010-07-12 ENCOUNTER — Encounter: Payer: Self-pay | Admitting: Family Medicine

## 2010-07-12 ENCOUNTER — Ambulatory Visit: Payer: Self-pay | Admitting: Thoracic Surgery (Cardiothoracic Vascular Surgery)

## 2010-07-12 ENCOUNTER — Encounter: Payer: Self-pay | Admitting: Cardiology

## 2010-07-17 ENCOUNTER — Telehealth: Payer: Self-pay | Admitting: Family Medicine

## 2010-07-17 ENCOUNTER — Ambulatory Visit: Payer: Self-pay | Admitting: Family Medicine

## 2010-07-18 ENCOUNTER — Encounter: Payer: Self-pay | Admitting: Family Medicine

## 2010-07-18 ENCOUNTER — Encounter: Payer: Self-pay | Admitting: Cardiology

## 2010-07-19 ENCOUNTER — Encounter: Payer: Self-pay | Admitting: Family Medicine

## 2010-07-20 ENCOUNTER — Encounter: Payer: Self-pay | Admitting: Cardiology

## 2010-07-24 ENCOUNTER — Telehealth (INDEPENDENT_AMBULATORY_CARE_PROVIDER_SITE_OTHER): Payer: Self-pay | Admitting: *Deleted

## 2010-07-26 ENCOUNTER — Ambulatory Visit: Payer: Self-pay | Admitting: Cardiology

## 2010-07-26 ENCOUNTER — Ambulatory Visit: Payer: Self-pay | Admitting: Internal Medicine

## 2010-08-02 ENCOUNTER — Ambulatory Visit: Payer: Self-pay | Admitting: Internal Medicine

## 2010-08-02 ENCOUNTER — Ambulatory Visit: Payer: Self-pay | Admitting: Vascular Surgery

## 2010-08-02 ENCOUNTER — Encounter (HOSPITAL_COMMUNITY)
Admission: RE | Admit: 2010-08-02 | Discharge: 2010-10-31 | Payer: Self-pay | Source: Home / Self Care | Attending: Cardiology | Admitting: Cardiology

## 2010-08-07 ENCOUNTER — Ambulatory Visit: Payer: Self-pay | Admitting: Cardiology

## 2010-08-07 ENCOUNTER — Telehealth (INDEPENDENT_AMBULATORY_CARE_PROVIDER_SITE_OTHER): Payer: Self-pay | Admitting: *Deleted

## 2010-08-07 LAB — CONVERTED CEMR LAB: POC INR: 1.6

## 2010-08-14 ENCOUNTER — Ambulatory Visit: Payer: Self-pay | Admitting: Cardiology

## 2010-08-14 LAB — CONVERTED CEMR LAB: POC INR: 1.8

## 2010-08-15 ENCOUNTER — Encounter: Payer: Self-pay | Admitting: Internal Medicine

## 2010-08-15 ENCOUNTER — Encounter: Payer: Self-pay | Admitting: Cardiology

## 2010-08-15 LAB — CONVERTED CEMR LAB: Prothrombin Time: 21.5 s

## 2010-08-17 ENCOUNTER — Encounter: Payer: Self-pay | Admitting: Internal Medicine

## 2010-08-17 ENCOUNTER — Encounter (INDEPENDENT_AMBULATORY_CARE_PROVIDER_SITE_OTHER): Payer: Self-pay | Admitting: *Deleted

## 2010-08-20 ENCOUNTER — Encounter: Payer: Self-pay | Admitting: Cardiology

## 2010-08-21 ENCOUNTER — Ambulatory Visit: Payer: Self-pay | Admitting: Cardiology

## 2010-08-21 ENCOUNTER — Telehealth: Payer: Self-pay | Admitting: Cardiology

## 2010-08-21 ENCOUNTER — Ambulatory Visit: Payer: Self-pay | Admitting: Cardiovascular Disease

## 2010-08-21 LAB — CONVERTED CEMR LAB: POC INR: 2.8

## 2010-08-22 ENCOUNTER — Telehealth: Payer: Self-pay | Admitting: Cardiology

## 2010-08-23 ENCOUNTER — Encounter: Payer: Self-pay | Admitting: Cardiology

## 2010-08-23 ENCOUNTER — Encounter: Payer: Self-pay | Admitting: Family Medicine

## 2010-08-28 ENCOUNTER — Ambulatory Visit: Payer: Self-pay | Admitting: Internal Medicine

## 2010-08-28 ENCOUNTER — Telehealth: Payer: Self-pay | Admitting: Cardiology

## 2010-08-28 ENCOUNTER — Encounter: Payer: Self-pay | Admitting: Cardiology

## 2010-08-29 ENCOUNTER — Encounter: Payer: Self-pay | Admitting: Cardiology

## 2010-09-04 ENCOUNTER — Ambulatory Visit: Payer: Self-pay | Admitting: Cardiovascular Disease

## 2010-09-04 ENCOUNTER — Ambulatory Visit: Payer: Self-pay | Admitting: Thoracic Surgery (Cardiothoracic Vascular Surgery)

## 2010-09-04 ENCOUNTER — Encounter: Payer: Self-pay | Admitting: Family Medicine

## 2010-09-04 ENCOUNTER — Encounter
Admission: RE | Admit: 2010-09-04 | Discharge: 2010-09-04 | Payer: Self-pay | Admitting: Thoracic Surgery (Cardiothoracic Vascular Surgery)

## 2010-09-04 LAB — CONVERTED CEMR LAB: POC INR: 3.1

## 2010-09-05 ENCOUNTER — Encounter: Payer: Self-pay | Admitting: Cardiology

## 2010-09-06 ENCOUNTER — Telehealth: Payer: Self-pay | Admitting: Cardiology

## 2010-09-06 ENCOUNTER — Ambulatory Visit (HOSPITAL_COMMUNITY): Admission: RE | Admit: 2010-09-06 | Discharge: 2010-09-06 | Payer: Self-pay | Admitting: Cardiology

## 2010-09-07 ENCOUNTER — Telehealth (INDEPENDENT_AMBULATORY_CARE_PROVIDER_SITE_OTHER): Payer: Self-pay | Admitting: Pharmacist

## 2010-09-12 ENCOUNTER — Encounter: Payer: Self-pay | Admitting: Cardiology

## 2010-09-17 ENCOUNTER — Ambulatory Visit: Payer: Self-pay | Admitting: Internal Medicine

## 2010-09-20 ENCOUNTER — Ambulatory Visit: Payer: Self-pay | Admitting: Vascular Surgery

## 2010-09-21 ENCOUNTER — Telehealth: Payer: Self-pay | Admitting: Cardiology

## 2010-09-21 ENCOUNTER — Encounter: Payer: Self-pay | Admitting: Cardiology

## 2010-09-24 ENCOUNTER — Telehealth: Payer: Self-pay | Admitting: Family Medicine

## 2010-09-24 ENCOUNTER — Ambulatory Visit: Payer: Self-pay | Admitting: Family Medicine

## 2010-09-25 ENCOUNTER — Telehealth: Payer: Self-pay | Admitting: Cardiology

## 2010-09-26 ENCOUNTER — Telehealth: Payer: Self-pay | Admitting: Family Medicine

## 2010-09-28 ENCOUNTER — Encounter: Payer: Self-pay | Admitting: Cardiology

## 2010-10-02 ENCOUNTER — Ambulatory Visit: Payer: Self-pay | Admitting: Internal Medicine

## 2010-10-03 ENCOUNTER — Encounter: Payer: Self-pay | Admitting: Cardiology

## 2010-10-04 ENCOUNTER — Ambulatory Visit (HOSPITAL_COMMUNITY)
Admission: RE | Admit: 2010-10-04 | Discharge: 2010-10-04 | Payer: Self-pay | Source: Home / Self Care | Attending: Cardiology | Admitting: Cardiology

## 2010-10-08 ENCOUNTER — Ambulatory Visit: Payer: Self-pay | Admitting: Cardiology

## 2010-10-11 ENCOUNTER — Encounter: Payer: Self-pay | Admitting: Family Medicine

## 2010-10-12 ENCOUNTER — Telehealth: Payer: Self-pay | Admitting: Family Medicine

## 2010-10-16 ENCOUNTER — Encounter: Payer: Self-pay | Admitting: Cardiology

## 2010-10-16 ENCOUNTER — Ambulatory Visit: Payer: Self-pay | Admitting: Internal Medicine

## 2010-10-16 LAB — CONVERTED CEMR LAB: POC INR: 2.5

## 2010-10-17 ENCOUNTER — Encounter: Payer: Self-pay | Admitting: Cardiology

## 2010-10-19 ENCOUNTER — Telehealth: Payer: Self-pay | Admitting: Family Medicine

## 2010-10-21 DIAGNOSIS — R161 Splenomegaly, not elsewhere classified: Secondary | ICD-10-CM

## 2010-10-21 HISTORY — DX: Splenomegaly, not elsewhere classified: R16.1

## 2010-10-22 IMAGING — CR DG CHEST 1V PORT
1 series · 1 of 1 positions shown · non-contrast
Comparison: 04/10/2010

CLINICAL DATA: Reevaluate congestive heart failure.

PORTABLE CHEST - 1 VIEW

[view not recorded]
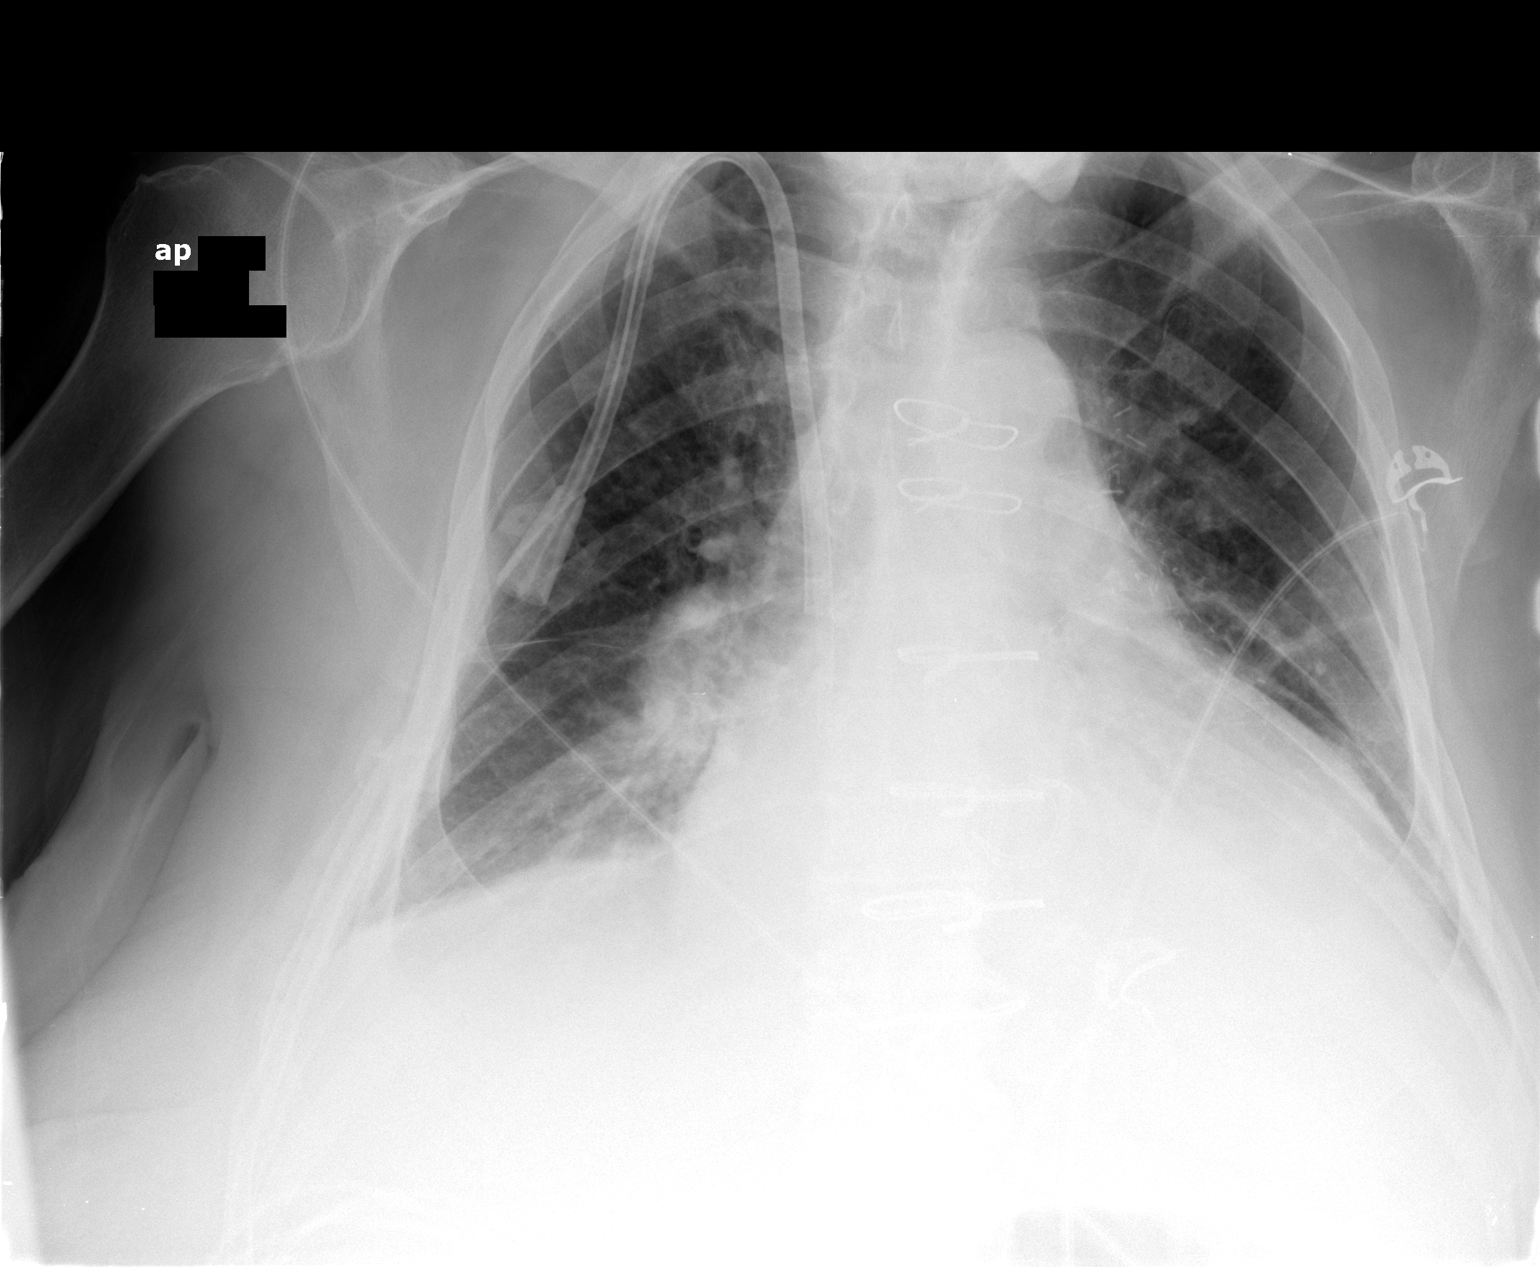

[1 of 1 positions shown; findings below may reference images not displayed]

FINDINGS: A dialysis catheter is unchanged in position with tips at
the low SVC and superior caval/atrial junction.

The chin overlies the medial apex minimally.  Moderate
cardiomegaly.  Probable small bilateral pleural effusions. No
pneumothorax.  Similar mild pulmonary venous congestion.  Minimal
worsening in right base atelectasis.  Persistent left lower lobe
airspace disease.
IMPRESSION: 1.  Cardiomegaly with similar pulmonary venous congestion.
2.  Similar left base air space disease, likely atelectasis.
3.  Slight increase in right base atelectasis with persistent small
bilateral pleural effusions.

## 2010-10-23 IMAGING — CR DG CHEST 1V PORT
1 series · 1 of 1 positions shown · non-contrast
Comparison: 04/20/2010

CLINICAL DATA: Shortness of breath

PORTABLE CHEST - 1 VIEW

[view not recorded]
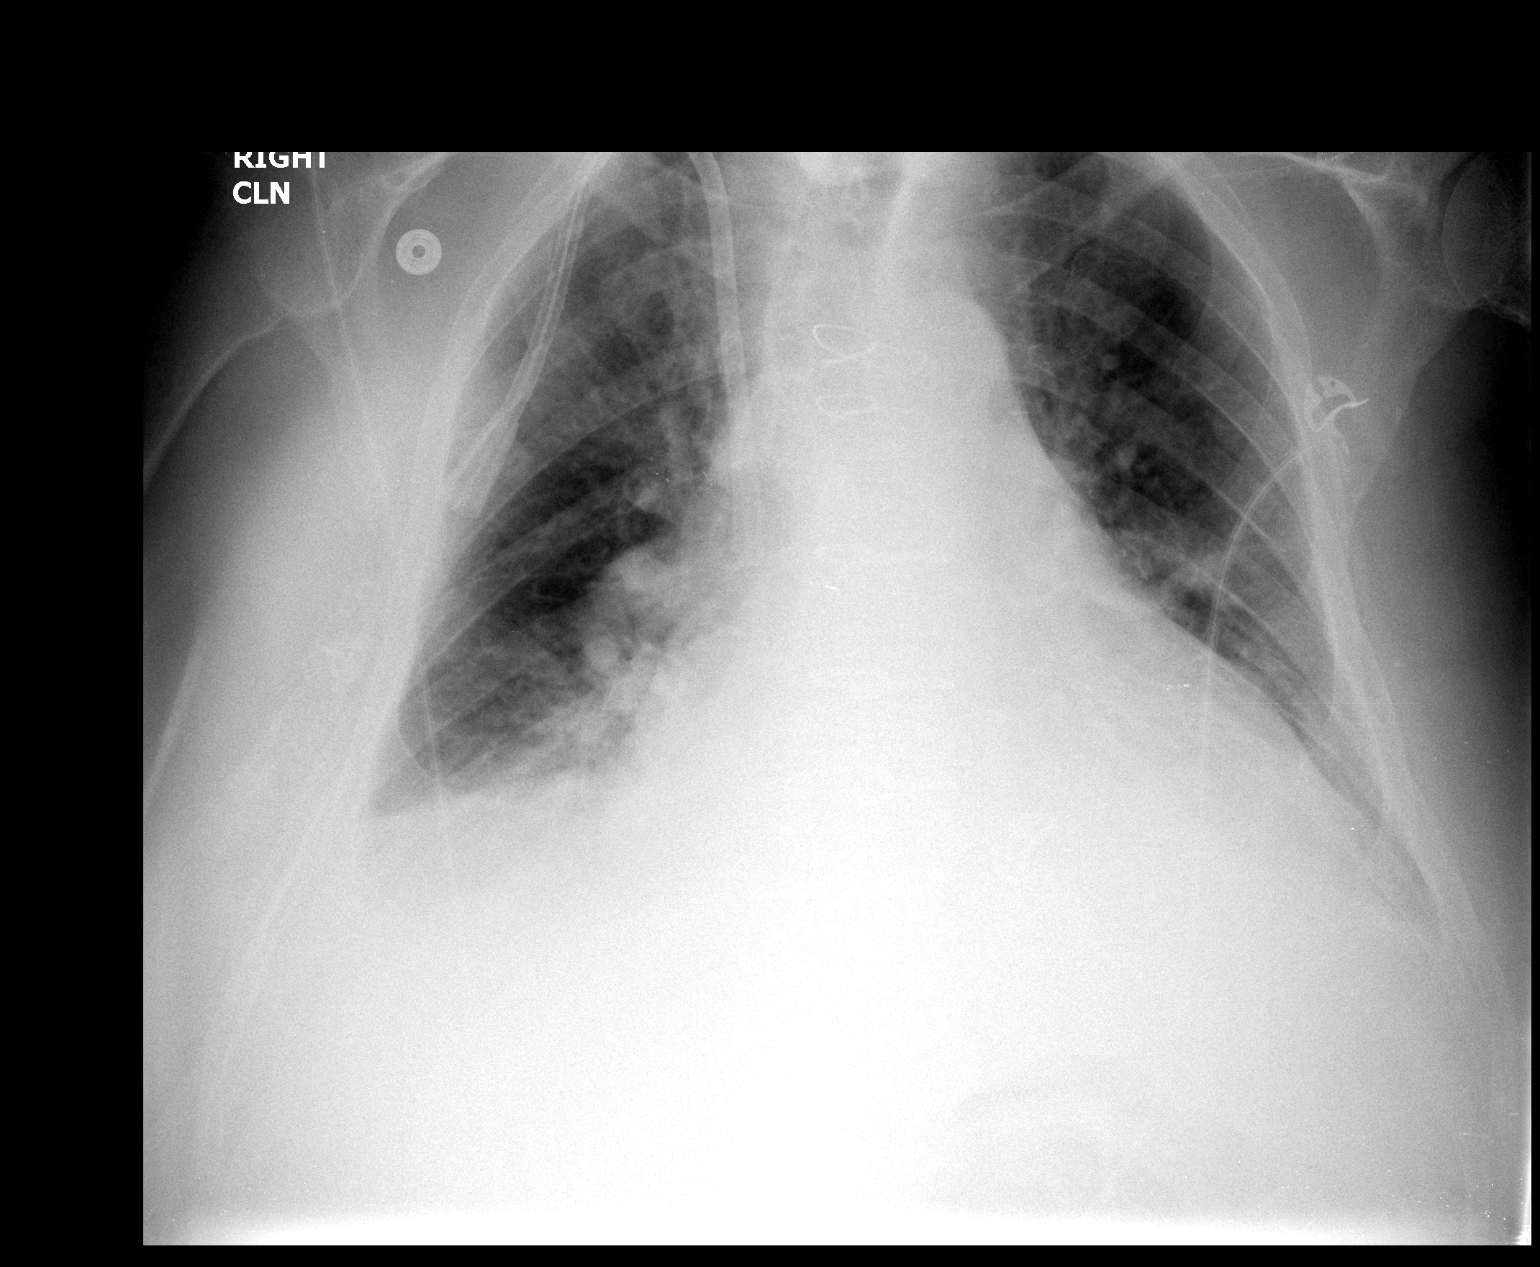

[1 of 1 positions shown; findings below may reference images not displayed]

FINDINGS: Right dual lumen IJ approach dialysis catheter tips
terminate over the cavoatrial junction and right atrium,
respectively.  Enlargement of the cardiomediastinal silhouette is
stable with minimal if any interstitial edema and trace effusions.
Evidence of median sternotomy noted.
IMPRESSION: Stable findings as above.

## 2010-10-25 ENCOUNTER — Ambulatory Visit: Admission: RE | Admit: 2010-10-25 | Discharge: 2010-10-25 | Payer: Self-pay | Source: Home / Self Care

## 2010-10-29 ENCOUNTER — Telehealth: Payer: Self-pay | Admitting: Cardiology

## 2010-10-29 ENCOUNTER — Encounter: Payer: Self-pay | Admitting: Cardiology

## 2010-10-31 ENCOUNTER — Telehealth: Payer: Self-pay | Admitting: Cardiology

## 2010-11-01 ENCOUNTER — Ambulatory Visit: Admit: 2010-11-01 | Payer: Self-pay

## 2010-11-01 ENCOUNTER — Inpatient Hospital Stay (HOSPITAL_COMMUNITY)
Admission: RE | Admit: 2010-11-01 | Discharge: 2010-11-04 | Payer: Self-pay | Source: Home / Self Care | Attending: Cardiology | Admitting: Cardiology

## 2010-11-02 ENCOUNTER — Encounter: Payer: Self-pay | Admitting: Internal Medicine

## 2010-11-05 LAB — CBC
HCT: 37.5 % — ABNORMAL LOW (ref 39.0–52.0)
HCT: 36.9 % — ABNORMAL LOW (ref 39.0–52.0)
HCT: 38.7 % — ABNORMAL LOW (ref 39.0–52.0)
HCT: 33.7 % — ABNORMAL LOW (ref 39.0–52.0)
Hemoglobin: 12.5 g/dL — ABNORMAL LOW (ref 13.0–17.0)
Hemoglobin: 12 g/dL — ABNORMAL LOW (ref 13.0–17.0)
Hemoglobin: 13.2 g/dL (ref 13.0–17.0)
Hemoglobin: 11.1 g/dL — ABNORMAL LOW (ref 13.0–17.0)
MCH: 32.7 pg (ref 26.0–34.0)
MCH: 32 pg (ref 26.0–34.0)
MCH: 33.6 pg (ref 26.0–34.0)
MCH: 32.4 pg (ref 26.0–34.0)
MCHC: 33.3 g/dL (ref 30.0–36.0)
MCHC: 32.5 g/dL (ref 30.0–36.0)
MCHC: 34.1 g/dL (ref 30.0–36.0)
MCHC: 32.9 g/dL (ref 30.0–36.0)
MCV: 98.2 fL (ref 78.0–100.0)
MCV: 98.4 fL (ref 78.0–100.0)
MCV: 98.5 fL (ref 78.0–100.0)
MCV: 98.3 fL (ref 78.0–100.0)
Platelets: 100 10*3/uL — ABNORMAL LOW (ref 150–400)
Platelets: 74 10*3/uL — ABNORMAL LOW (ref 150–400)
Platelets: 71 10*3/uL — ABNORMAL LOW (ref 150–400)
Platelets: 66 10*3/uL — ABNORMAL LOW (ref 150–400)
RBC: 3.82 MIL/uL — ABNORMAL LOW (ref 4.22–5.81)
RBC: 3.75 MIL/uL — ABNORMAL LOW (ref 4.22–5.81)
RBC: 3.93 MIL/uL — ABNORMAL LOW (ref 4.22–5.81)
RBC: 3.43 MIL/uL — ABNORMAL LOW (ref 4.22–5.81)
RDW: 16.6 % — ABNORMAL HIGH (ref 11.5–15.5)
RDW: 16.8 % — ABNORMAL HIGH (ref 11.5–15.5)
RDW: 17.2 % — ABNORMAL HIGH (ref 11.5–15.5)
RDW: 17 % — ABNORMAL HIGH (ref 11.5–15.5)
WBC: 6.4 10*3/uL (ref 4.0–10.5)
WBC: 6 10*3/uL (ref 4.0–10.5)
WBC: 6.9 10*3/uL (ref 4.0–10.5)
WBC: 6.1 10*3/uL (ref 4.0–10.5)

## 2010-11-05 LAB — HEPARIN LEVEL (UNFRACTIONATED)
Heparin Unfractionated: 0.12 IU/mL — ABNORMAL LOW (ref 0.30–0.70)
Heparin Unfractionated: 0.13 IU/mL — ABNORMAL LOW (ref 0.30–0.70)
Heparin Unfractionated: 0.15 IU/mL — ABNORMAL LOW (ref 0.30–0.70)
Heparin Unfractionated: 0.1 IU/mL — ABNORMAL LOW (ref 0.30–0.70)
Heparin Unfractionated: 0.58 IU/mL (ref 0.30–0.70)
Heparin Unfractionated: 0.37 IU/mL (ref 0.30–0.70)

## 2010-11-05 LAB — RENAL FUNCTION PANEL
Albumin: 3.8 g/dL (ref 3.5–5.2)
BUN: 43 mg/dL — ABNORMAL HIGH (ref 6–23)
CO2: 27 mEq/L (ref 19–32)
Calcium: 8.7 mg/dL (ref 8.4–10.5)
Chloride: 97 mEq/L (ref 96–112)
Creatinine, Ser: 4.53 mg/dL — ABNORMAL HIGH (ref 0.4–1.5)
GFR calc Af Amer: 16 mL/min — ABNORMAL LOW (ref >60)
GFR calc non Af Amer: 13 mL/min — ABNORMAL LOW (ref >60)
Glucose, Bld: 185 mg/dL — ABNORMAL HIGH (ref 70–99)
Phosphorus: 5.3 mg/dL — ABNORMAL HIGH (ref 2.3–4.6)
Potassium: 4.2 mEq/L (ref 3.5–5.1)
Sodium: 135 mEq/L (ref 135–145)

## 2010-11-05 LAB — GLUCOSE, CAPILLARY
Glucose-Capillary: 269 mg/dL — ABNORMAL HIGH (ref 70–99)
Glucose-Capillary: 127 mg/dL — ABNORMAL HIGH (ref 70–99)
Glucose-Capillary: 143 mg/dL — ABNORMAL HIGH (ref 70–99)
Glucose-Capillary: 147 mg/dL — ABNORMAL HIGH (ref 70–99)
Glucose-Capillary: 157 mg/dL — ABNORMAL HIGH (ref 70–99)
Glucose-Capillary: 94 mg/dL (ref 70–99)
Glucose-Capillary: 139 mg/dL — ABNORMAL HIGH (ref 70–99)
Glucose-Capillary: 114 mg/dL — ABNORMAL HIGH (ref 70–99)
Glucose-Capillary: 172 mg/dL — ABNORMAL HIGH (ref 70–99)
Glucose-Capillary: 125 mg/dL — ABNORMAL HIGH (ref 70–99)
Glucose-Capillary: 120 mg/dL — ABNORMAL HIGH (ref 70–99)
Glucose-Capillary: 126 mg/dL — ABNORMAL HIGH (ref 70–99)

## 2010-11-05 LAB — PROTIME-INR
INR: 1.83 — ABNORMAL HIGH (ref 0.00–1.49)
INR: 1.95 — ABNORMAL HIGH (ref 0.00–1.49)
INR: 1.87 — ABNORMAL HIGH (ref 0.00–1.49)
INR: 2.49 — ABNORMAL HIGH (ref 0.00–1.49)
Prothrombin Time: 21.3 seconds — ABNORMAL HIGH (ref 11.6–15.2)
Prothrombin Time: 22.4 seconds — ABNORMAL HIGH (ref 11.6–15.2)
Prothrombin Time: 21.7 seconds — ABNORMAL HIGH (ref 11.6–15.2)
Prothrombin Time: 27 seconds — ABNORMAL HIGH (ref 11.6–15.2)

## 2010-11-05 LAB — POCT I-STAT 4, (NA,K, GLUC, HGB,HCT)
Glucose, Bld: 152 mg/dL — ABNORMAL HIGH (ref 70–99)
HCT: 39 % (ref 39.0–52.0)
Hemoglobin: 13.3 g/dL (ref 13.0–17.0)
Potassium: 3.9 mEq/L (ref 3.5–5.1)
Sodium: 138 mEq/L (ref 135–145)

## 2010-11-05 LAB — APTT: aPTT: 38 seconds — ABNORMAL HIGH (ref 24–37)

## 2010-11-05 LAB — TSH: TSH: 1.555 u[IU]/mL (ref 0.350–4.500)

## 2010-11-05 LAB — HEMOGLOBIN A1C
Hgb A1c MFr Bld: 5.5 % (ref <5.7)
Mean Plasma Glucose: 111 mg/dL (ref <117)

## 2010-11-08 ENCOUNTER — Ambulatory Visit: Admission: RE | Admit: 2010-11-08 | Discharge: 2010-11-08 | Payer: Self-pay | Source: Home / Self Care

## 2010-11-08 ENCOUNTER — Telehealth (INDEPENDENT_AMBULATORY_CARE_PROVIDER_SITE_OTHER): Payer: Self-pay | Admitting: *Deleted

## 2010-11-08 LAB — CONVERTED CEMR LAB: POC INR: 3.7

## 2010-11-11 NOTE — Procedures (Signed)
  NAME:  [name] Brown, [name] Brown NO.:  [id number]  MEDICAL RECORD NO.:  [id number]          PATIENT TYPE:  INP  LOCATION:  2029                         FACILITY:  [facility]  PHYSICIAN:  Shaune Pascal. Bensimhon, MDDATE OF BIRTH:  April 04, 1936  DATE OF PROCEDURE: DATE OF DISCHARGE:                           CARDIAC CATHETERIZATION   PROCEDURE:  Direct current cardioversion.  INDICATIONS:  Symptomatic atrial fibrillation.  DESCRIPTION OF PROCEDURE:  The risks and indication were explained. Consent was signed and placed in the chart.  On arrival to the endoscopy suite, his INR was 1.95 with a slightly subtherapeutic heparin level.  He was thus given 1500 units of systemic heparin and his heparin rate was increased prior to the procedure.  He then underwent a successful TEE, which showed an EF of 45-50% with severe left atrial enlargement.  The left atrial appendage had some spontaneous echo contrast, but no formed clot.  After appropriate sedation, he then underwent synchronized 150-joule biphasic shock with prompt conversion to sinus bradycardia.  There were no apparent complications.     Shaune Pascal. Bensimhon, MD     DRB/MEDQ  D:  11/02/2010  T:  11/02/2010  Job:  [id number]  Electronically Signed by Glori Bickers MD on 11/11/2010 07:13:31 PM

## 2010-11-12 NOTE — Discharge Summary (Signed)
NAME:  [name] Brown, [name] Brown NO.:  [id number]  MEDICAL RECORD NO.:  [id number]          PATIENT TYPE:  INP  LOCATION:  2029                         FACILITY:  [facility]  PHYSICIAN:  [name] Bamberg. Johnsie Cancel, MD, FACCDATE OF BIRTH:  October 05, 1936  DATE OF ADMISSION:  11/01/2010 DATE OF DISCHARGE:  11/04/2010                              DISCHARGE SUMMARY   DISCHARGE DIAGNOSES: 1. Atrial fibrillation status post transesophageal echocardiography     cardioversion with successful restoration of normal sinus rhythm on     November 02, 2010.     a.     Continued on Coumadin. 2. Coronary artery disease status post coronary artery bypass graft in     June 2011 with left internal mammary artery to the left anterior     descending, saphenous vein graft to the obtuse marginal, saphenous     vein graft to the posterior descending artery. 3. Status post aortic valve replacement at the same time as bypass     surgery with pericardial tissue valve, size 25. 4. End-stage renal disease, on hemodialysis.     a.     Status post right brachiocephalic atrioventricular fistula      in July 2011 as well as right internal jugular hemodialysis      catheter in May 2011.     b.     Nonhemorrhagic subcortical/cortical cerebrovascular accident      in August 2011. 5. Chronic anticoagulation with Coumadin. 6. History of thrombocytopenia. 7. Gout. 8. Chronic systolic congestive heart failure with volume management     with dialysis with ejection fraction of 45% to 50% by     transesophageal echocardiography on November 02, 2010, which was     actually improved from his prior ejection fraction of 30% by     transesophageal echocardiography in June 2011. 9. Subclinical hypothyroidism. 10.Obesity, status post significant weight loss. 11.Secondary hyperparathyroidism. 12.Hyperlipidemia. 13.Diabetes mellitus. 14.Idiopathic hepatitis in February 2011. 15.Non-ST-elevation myocardial infarction type 2 in February  2011.  HOSPITAL COURSE:  [name] Brown is a 75 year old gentleman with a prior history of coronary artery disease status post CABG who was found to have atrial fibrillation in August 2011 at the time of his CVA.  [name] Brown followed him closely after the admission, and the plan was to cardiovert him once his Coumadin level was therapeutic for 4 weeks.  His Coumadin had been repeatedly subtherapeutic and the procedure was not performed.  He had 3 weeks of therapeutic Coumadin levels and came to the hospital today for cardioversion; however, Coumadin was subtherapeutic at 1.8.  The decision was made to admit the patient and start him on heparin until his INR is greater than 2 and then proceed with TEE cardioversion.  His cardioversion was performed by [name] Brown on November 02, 2010, which successfully restored the patient to sinus bradycardia.  There were no complications.  Prior to cardioversion, he had a TEE which showed an EF of 45% to 50% with severe left atrial enlargement.  The left atrial appendage showed somespontaneous echo contrast but no formed clot.  The patient tolerated the procedure well.  He did have some bradycardia  so his beta-blocker was cut down.  His digoxin was discontinued.  On day of discharge, his heart rate is still low, so Dr. Johnsie Brown has elected to discontinue his Toprol. His INR is therapeutic today at 2.49.  I discussed with Pharmacy his Coumadin dosing being subtherapeutic.  Their recommendations are reflected in his medication reconciliation form.  Dr. Johnsie Brown has seen and examined him today and feels he is stable for discharge.  DISCHARGE LABORATORY DATA:  WBC 6.1, hemoglobin 11.1, hematocrit 33.7, platelet count of 66,000, INR is 2.49.  STUDIES:  TEE/cardioversion, please see above for report and full report for details.  DISCHARGE MEDICATIONS: 1. Allopurinol 100 mg daily, reduced per pharmacy recommendation     secondary to renal dysfunction. 2.  Coumadin 2 mg Tuesday, Thursday, Saturday, Sunday, and 4 mg Monday,     Wednesday, Friday. 3. Amiodarone 200 mg daily. 4. Colchicine 0.6 mg t.i.d. p.r.n. 5. Lantus 10-30 units subcutaneously daily. 6. Levothyroxine 25 mcg 1 tablet daily before breakfast. 7. Omega-3-acid ethyl esters 1 g one capsule daily. 8. Oxycodone 5 mg q.4 h. p.r.n. 9. Zocor 20 mg at bedtime. 10.Tramadol 50 mg 1-2 tablets q.4 h. p.r.n. pain. 11.Vol-Care 1 tablet daily. 12.Xanax 0.5 mg half tablet q.8 h. p.r.n.  Please note, the patient's metoprolol and digoxin were discontinued this admission secondary to bradycardia.  DISPOSITION:  [name] Brown was discharged in stable condition to home. He is instructed to increase activity slowly, follow a low-sodium, heart- healthy diabetic diet.  He will follow with [name] Dopp, PA-C, at [facility] in 2-3 weeks and our office while will call him with that appointment.  He will have his INR checked on Thursday per Dr. Johnsie Brown and our office will call him with that appointment as well.  He is to follow up with dialysis on November 05, 2010, at [facility]_ Clinic as previously scheduled.  DURATION OF DISCHARGE ENCOUNTER:  Greater than 30 minutes including physician and PA time.     [name] Brown, P.A.C.   ______________________________ [name] Bamberg Johnsie Cancel, MD, [facility]    DD/MEDQ  D:  11/04/2010  T:  11/05/2010  Job:  [id number]  cc:   Annie Main A. Sarajane Jews, MD Minus Breeding, MD, [facility].  Electronically Signed by Jenkins Rouge MD [facility] on 11/06/2010 01:01:05 PM Electronically Signed by Melina Copa  on 11/12/2010 10:33:04 AM

## 2010-11-13 ENCOUNTER — Telehealth: Payer: Self-pay | Admitting: Cardiology

## 2010-11-14 ENCOUNTER — Encounter (INDEPENDENT_AMBULATORY_CARE_PROVIDER_SITE_OTHER): Payer: Self-pay | Admitting: Pharmacist

## 2010-11-15 ENCOUNTER — Ambulatory Visit: Admission: RE | Admit: 2010-11-15 | Discharge: 2010-11-15 | Payer: Self-pay | Source: Home / Self Care

## 2010-11-15 LAB — CONVERTED CEMR LAB: POC INR: 3

## 2010-11-15 NOTE — H&P (Addendum)
NAME:  [name] Brown, [name] Brown NO.:  [id number]  MEDICAL RECORD NO.:  [id number]          PATIENT TYPE:  INP  LOCATION:  2029                         FACILITY:  [facility]  PHYSICIAN:  Minus Breeding, MD, FACCDATE OF BIRTH:  Jan 14, 1936  DATE OF ADMISSION:  11/01/2010 DATE OF DISCHARGE:                             HISTORY & PHYSICAL   PRIMARY CARE PHYSICIAN:  [name] Main A. Sarajane Jews, MD  NEPHROLOGIST:  At [facility].  CARDIOLOGIST:  Minus Breeding, MD, [facility]  CHIEF COMPLAINT:  Atrial fibrillation.  HISTORY OF PRESENT ILLNESS:  [name] Brown is a 75 year old male with a history of coronary artery disease and end-stage renal disease on hemodialysis.  He was found to have atrial fibrillation in August 2011, at the time he had a CVA.  [name] Brown has followed him closely, and plans have been made in the past to cardiovert him once his Coumadin was therapeutic for 4 weeks.  His Coumadin has been repeatedly subtherapeutic, and the procedure was not performed.  He has 3 weeks of therapeutic Coumadin levels and came to the hospital today for cardioversion; however, his Coumadin was subtherapeutic at 1.8.  He is being admitted for heparin until his INR is greater than 2.0 on Coumadin.  A TEE cardioversion is planned for tomorrow.  [name] Brown has no symptoms directly attributable to the atrial fibrillation.  He has a great deal of fatigue and listlessness after dialysis.  His appetite is poor and, especially on dialysis days, does not eat very much.  His wife states that he eats healthy meals when he eats, but the patient admits that he rarely eats more than twice a day.  He uses Nepro occasionally as a meal supplement but not that often because of the cost.  He never gets palpitations, syncope, or presyncope.  He tries to be compliant with diabetic/renal dietary restrictions. Currently, he is resting comfortably and asymptomatic.  PAST MEDICAL HISTORY: 1. Status  post aortocoronary bypass surgery in June 2011 with LIMA to     LAD, SVG to OM, SVG to PDA. 2. Status post aortic valve replacement at the time of his bypass     surgery with a pericardial tissue valve, size 25. 3. Status post right brachiocephalic AV fistula in July 2011 as well     as a right IJ hemodialysis catheter in May 2011. 4. End-stage renal disease, on hemodialysis. 5. Nonhemorrhagic subcortical/cortical cerebrovascular accident in     August 2011. 6. Chronic anticoagulation with Coumadin. 7. History of hypokalemia and thrombocytopenia. 8. Gout. 9. Chronic systolic CHF with volume management by dialysis, EF 30% by     intraoperative TEE in June 2011. 10.Subclinical hypothyroidism per the discharge summary in August     2011. 11.History of obesity, status post significant weight loss. 12.Secondary hypoparathyroidism secondary to hypercalcemia. 13.Hyperlipidemia. 14.Diabetes. 15.History of idiopathic hepatitis in February 2011. 16.History of non-ST-segment elevation MI, type 2, in February 2011.PAST SURGICAL HISTORY:  He is status post bypass surgery with aortic valve replacement, as well as cardiac catheterization, right brachiocephalic AV fistula, cholecystectomy, and appendectomy.  ALLERGIES:  PENICILLIN.  CURRENT MEDICATIONS: 1. Flonase 2 sprays daily. 2. Lantus  20 units nightly. 3. Colcrys 0.6 mg p.r.n. 4. Amiodarone 200 mg a day. 5. Levothroid 25 mcg daily. 6. Toprol-XL 50 mg daily. 7. Vol-Care daily. 8. Ultram 50 mg p.r.n. 9. Coumadin 2 mg as directed. 10.Allopurinol 300 mg a day. 11.Digoxin 0.125 mg every other day. 12.Zocor 20 mg a day. 13.Alprazolam 0.5 mg p.r.n. 14.Oxycodone 5 mg p.r.n. 15.MiraLax daily. 16.Nepro liquid daily. 17.Fish oil daily.  SOCIAL HISTORY:  He lives in [place] with his wife.  He is retired from [employer].  He has no history of alcohol, tobacco, or drug abuse.  FAMILY HISTORY:  His mother died at age 37 with CHF and his father  died of cancer and was elderly with no cardiac issues.  No siblings have cardiac issues.  REVIEW OF SYSTEMS:  He has fatigue.  He denies dyspnea on exertion.  He feels that his weight has been trending downward secondary to decreased p.o. intake.  His dry weight at dialysis is now down to less than 90 kg. He has arthralgias and joint pains at times.  He has not had fevers or chills.  He has had no bleeding problems on the Coumadin.  Full 14-point review of systems is, otherwise, negative except as stated in the HPI.  PHYSICAL EXAMINATION:  VITAL SIGNS:  Temperature 97.7, blood pressure 143/64, pulse 61, respiratory rate 18, O2 saturation 99% on room air. GENERAL:  He is a well-developed elderly white male, in no acute distress. HEENT:  Normal. NECK:  There is no lymphadenopathy, thyromegaly, bruit, or JVD noted. CARDIOVASCULAR:  His heart is irregular in rate and rhythm with an S1 and S2 and a soft systolic murmur is noted. EXTREMITIES:  Distal pulses are intact in all four extremities.  His right upper extremity has a dialysis graft with a palpable thrill. LUNGS:  He has a few rales in the bases but no crackles or wheeze are noted. SKIN:  He has some ecchymotic areas from blood draws but otherwise, no rashes or lesions are noted. ABDOMEN:  Soft and nontender with active bowel sounds. EXTREMITIES:  There is no cyanosis, clubbing, or edema noted. MUSCULOSKELETAL:  There is no joint deformity or effusions. NEUROLOGIC:  He is alert and oriented with no focal deficits noted.  LABORATORY VALUES:  INR 1.83.  EKG; atrial fibrillation, rate 60 with left anterior fascicular block and an old septal infarct.  IMPRESSION:  [name] Brown is being seen by [name] Brown, and the data were reviewed.  He is a 75 year old male with a history of end-stage renal disease on hemodialysis and atrial fibrillation.  He has been on amiodarone, and we believe he would benefit from sinus rhythm.   Because his INR is subtherapeutic today, he will be admitted to the hospital and started on heparin.  The renal team will be contacted to manage his dialysis and the pharmacy will manage his anticoagulation.  A transesophageal echocardiogram with cardioversion has been scheduled for tomorrow.  The situation was discussed with the patient and his wife who are in agreement with the plan.  He will be continued on his other home medications.  A nutrition consult has been called to help him improve his p.o. intake appropriately.     Rosaria Ferries, PA-C   ______________________________ Minus Breeding, MD, [facility]    RB/MEDQ  D:  11/01/2010  T:  11/02/2010  Job:  [id number]  Electronically Signed by Rosaria Ferries PA-C on 11/12/2010 04:57:17 PM Electronically Signed by Minus Breeding MD [facility] on 11/15/2010 12:28:59 PM

## 2010-11-18 LAB — CONVERTED CEMR LAB
Eosinophils Absolute: 0.4 10*3/uL (ref 0.0–0.7)
Monocytes Absolute: 0.5 10*3/uL (ref 0.1–1.0)
Monocytes Relative: 6.1 % (ref 3.0–12.0)
Neutro Abs: 5.5 10*3/uL (ref 1.4–7.7)
Platelets: 127 10*3/uL — ABNORMAL LOW (ref 150–400)
RBC: 3.43 M/uL — ABNORMAL LOW (ref 4.22–5.81)
RDW: 15.4 % — ABNORMAL HIGH (ref 11.5–14.6)

## 2010-11-21 NOTE — Assessment & Plan Note (Signed)
Summary: EPH   Visit Type:  Follow-up Primary Provider:  Laurey Morale, MD   CC:  aortic stenosis.  History of Present Illness: The patient presents for followup after hospitalization during which she was managed for an acute hepatitis and renal insufficiency. He was felt to be dehydrated. The etiology of his hepatitis was not clear. He was taken off of statins and other drugs. He was thought possibly to have an unidentified viral hepatitis. At the time of that hospitalization he was noted to have worsening aortic stenosis. His ejection fraction which had been low normal is now 45-50%. His mean gradient had been 27 and was now 44. They'll vary was 0.71. I reviewed previous echoes and his hospital records as well as recent outpatient records. Unfortunately he continues to do poorly particularly with nausea and vomiting. He was recently told to go to a liquid diet to see if his symptoms improved and in fact they have. No other clear etiology for these ongoing complaints is identified. He does get short of breath with mild to moderate activity which is progressive compared with the last time I saw him as an outpatient. However, he is not getting any resting shortness of breath and has had no PND or orthopnea. He does have lower extremity edema which is chronic. He says when he left the hospital he had been so significantly volume resuscitated that he was grossly edematous and that this has slowly improved. He is not describing chest pressure, neck or arm discomfort. He is not describing palpitations, presyncope or syncope.  Current Medications (verified): 1)  Furosemide 40 Mg Tabs (Furosemide) .... 2 in Am and 1 in Pm 2)  Ambien 10 Mg  Tabs (Zolpidem Tartrate) .... One At Bedtime 3)  Allopurinol 300 Mg  Tabs (Allopurinol) .... Once Daily 4)  Bystolic 20 Mg Tabs (Nebivolol Hcl) .Marland Kitchen.. 1 By Mouth Daily 5)  Aspirin 81 Mg  Tabs (Aspirin) .... Daily 6)  Folic Acid 1 Mg Tabs (Folic Acid) .... Once Daily 7)   Flonase 50 Mcg/act Susp (Fluticasone Propionate) .... 2 Sprays Each Nostril Q Day 8)  Lantus 100 Unit/ml Soln (Insulin Glargine) .... 20 Units At Bedtime 9)  Promethazine Hcl 25 Mg Tabs (Promethazine Hcl) .Marland Kitchen.. 1 Q 4 Hours As Needed Nausea 10)  Benzonatate 100 Mg Caps (Benzonatate) .... Out 11)  Colcrys 0.6 Mg Tabs (Colchicine) .... Out  Allergies (verified): 1)  Penicillin V Potassium (Penicillin V Potassium)  Past History:  Past Medical History: Coronary artery disease (non obstructive) Non- ST elevation MI   11-2009(secondary to supply demand mismatch) SEVERE AORTIC STENOSIS Hyperlipidemia Hypertension Diabetes mellitus, type 2 LE edema Mild carotid artery disease, Dopplers in 2007 Gout Obesity Renal insufficiency, sees Dr. Florene Glen Idiopathic hepatitis 11-2009  Vital Signs:  Patient profile:   75 year old male Height:      72.5 inches Weight:      257 pounds BMI:     34.50 Pulse rate:   78 / minute Resp:     18 per minute BP sitting:   148 / 98  (right arm)  Vitals Entered By: Levora Angel, CNA (January 15, 2010 11:16 AM)  Physical Exam  General:  Well developed, well nourished, in no acute distress. Head:  normocephalic and atraumatic Eyes:  PERRLA/EOM intact; conjunctiva and lids normal. Mouth:  Oral mucosa normal. Neck:  Neck supple, no JVD. No masses, thyromegaly or abnormal cervical nodes. Chest Wall:  no deformities or breast masses noted Lungs:  Clear bilaterally  to auscultation and percussion. Abdomen:  Bowel sounds positive; abdomen soft and non-tender without masses, organomegaly, or hernias noted. No hepatosplenomegaly, obese Msk:  Back normal, normal gait. Muscle strength and tone normal. Extremities:  moderate bilateral lower extremity edema Neurologic:  Alert and oriented x 3. Skin:  Intact without lesions or rashes. Cervical Nodes:  no significant adenopathy Axillary Nodes:  no significant adenopathy Inguinal Nodes:  no significant adenopathy Psych:   Normal affect.   Detailed Cardiovascular Exam  Neck    Carotids: Carotids full and equal bilaterally without bruits.  Positive transmitted systolic murmur    Neck Veins: Normal, no JVD.    Heart    Inspection: no deformities or lifts noted.      Palpation: normal PMI with no thrills palpable.      Auscultation: regular rate and rhythm, S1, S2 without rubs, gallops, or clicks.  3/6 apical systolic murmur radiating out the aortic outflow tract and mid peaking, no diastolic murmurs.  Vascular    Abdominal Aorta: no palpable masses, pulsations, or audible bruits.      Femoral Pulses: normal femoral pulses bilaterally.      Pedal Pulses: normal pedal pulses bilaterally.      Radial Pulses: normal radial pulses bilaterally.      Peripheral Circulation: no clubbing, cyanosis, with normal capillary refill.     EKG  Procedure date:  01/15/2010  Findings:      sinus rhythm, rate 78, left axis deviation, left anterior fascicular block, poor anterior R-wave progression, QT prolonged, nonspecific T-wave flattening  Impression & Recommendations:  Problem # 1:  AORTIC VALVE DISORDERS (ICD-424.1) The patient now has severe aortic stenosis and will need to have this fixed if he is a surgical candidate. I discussed this with his daughter and his granddaughter who is a Marine scientist. However, he needs to be further removed from this acute illness and in particular his nausea and vomiting improved. I do not think that this is related to cardiac etiology. Orders: EKG w/ Interpretation (93000)  Problem # 2:  RENAL INSUFFICIENCY (ICD-588.4) His creatinine was 1.9 when checked recently. This is stable.  Problem # 3:  UNSPECIFIED HEPATITIS (ICD-573.3) The etiology of this is not clear. I spoke with Dr. Ardis Hughs about this today. Despite the fact that he did have a significantly elevated BNP in the hospital I do not think that aortic stenosis with subsequent left and right heart involvement lead to passive  congestion to explain an AST of 10,000. He had no overt heart failure symptoms during that admission. I certainly don't think his ongoing nausea is related to his valvular disease. I have asked Dr. Ardis Hughs to reevaluate. I have asked the patient to slowly resume and advance his diet. I have asked him to increase his activity level. When he has recovered from acute events I will consider further evaluation of his heart valve.  Patient Instructions: 1)  Your physician recommends that you schedule a follow-up appointment in: 1 month with Dr Percival Spanish 2)  Your physician recommends that you continue on your current medications as directed. Please refer to the Current Medication list given to you today.

## 2010-11-21 NOTE — Progress Notes (Signed)
Summary: Oxycodone  Phone Note Call from Patient   Caller: Patient Call For: Laurey Morale MD Summary of Call: Pt wants a refill of Oxycodone.  Please call him at [phone]  [facility] Initial call taken by: Deanna Artis CMA,  June 26, 2010 11:00 AM  Follow-up for Phone Call        done  Follow-up by: Laurey Morale MD,  June 26, 2010 1:22 PM  Additional Follow-up for Phone Call Additional follow up Details #1::        pt notified Additional Follow-up by: Levora Angel, RN,  June 26, 2010 1:43 PM    New/Updated Medications: OXYCODONE HCL 5 MG TABS (OXYCODONE HCL) 1 q4h as needed Prescriptions: OXYCODONE HCL 5 MG TABS (OXYCODONE HCL) 1 q4h as needed  #120 x 0   Entered and Authorized by:   Laurey Morale MD   Signed by:   Laurey Morale MD on 06/26/2010   Method used:   Print then Give to Patient   RxID:   [id number]

## 2010-11-21 NOTE — Progress Notes (Signed)
Summary: Call-A-Nurse Report    Call-A-Nurse Triage Call Report Triage Record Num: [id number] Operator: Agustina Caroli Patient Name: [name] Brown Call Date & Time: 12/13/2009 7:48:55PM Patient Phone: PCP: Patient Gender: Male PCP Fax : Patient DOB: 05-15-1936 Practice Name: [facility] Reason for Call: Kelly/Son states [name] was seen in office 02/23 and diagnosed with influenza 02/23.Son states Father very stubborn and will not go to ED, Short of breath. Dizziness with any exertion. Uncontrolled blood sugars.Vomiting everything he eats. Blurred vision.Advised to hang up and call 911. Pt refuses to go without calling MD. WAnts to be admitted directly to hospital. Dr .Lorelei Pont notified and advised [name] be evaluated in ED tonight. States complications from influenza and lead to death. Son informed. Care advice given. Protocol(s) Used: Breathing Problems Recommended Outcome per Protocol: Activate EMS 911 Reason for Outcome: Sudden onset of severe breathing difficulty New or worsening signs and symptoms that may indicate shock Care Advice:  ~ Protect the patient from falling or other harm.  ~ Place patient in a position of comfort and loosen tight clothing.  ~ An adult should stay with the patient, preferably one trained in CPR. 12/13/2009 8:12:11PM Page 1 of 1 [email or web address]  Appended Document: Call-A-Nurse Report I see in Echart that he was admitted early this am

## 2010-11-21 NOTE — Medication Information (Signed)
Summary: rov/nb  Anticoagulant Therapy  Managed by: Tula Nakayama, RN, BSN Referring MD: Percival Spanish PCP: Alysia Penna, MD Supervising MD: Burt Knack MD, Legrand Como Indication 1: Atrial Fibrillation Lab Used: [facility] [place] Site: [place] INR POC 3.1 INR RANGE 2.0-3.0  Dietary changes: no    Health status changes: no    Bleeding/hemorrhagic complications: no    Recent/future hospitalizations: no    Any changes in medication regimen? no    Recent/future dental: no  Any missed doses?: no       Is patient compliant with meds? yes      Comments: Pending DCCV & TEE on 09/06/10.  Also pt having a procedure today that he states they are inserting needle in his back to drain fluid off his lungs, ? Thorocentesis. He was given and instructed to informed Dr Blase Mess on his INR today.   Allergies: 1)  Penicillin V Potassium (Penicillin V Potassium)  Anticoagulation Management History:      The patient is taking warfarin and comes in today for a routine follow up visit.  Positive risk factors for bleeding include an age of 73 years or older and presence of serious comorbidities.  The bleeding index is 'intermediate risk'.  Positive CHADS2 values include History of CHF, History of HTN, and History of Diabetes.  Negative CHADS2 values include Age > 12 years old.  His last INR was 1.4 ratio.  Anticoagulation responsible provider: Burt Knack MD, Legrand Como.  INR POC: 3.1.  Cuvette Lot#: [id number].  Exp: 09/2011.    Anticoagulation Management Assessment/Plan:      The patient's current anticoagulation dose is Coumadin 2 mg tabs: as directed.  The target INR is 2.0-3.0.  The next INR is due 09/12/2010.  Anticoagulation instructions were given to patient.  Results were reviewed/authorized by Tula Nakayama, RN, BSN.  He was notified by Tula Nakayama, RN, BSN.         Prior Anticoagulation Instructions: INR 2.9 Continue previous dose of 1 tablet everyday except 1.5 tablets on Monday,  Wednesday, and Thursday. Recheck INR in 1 week.  Current Anticoagulation Instructions: INR 3.1 Continue 2mg s everyday except 3mg s on Mondays, Wednesdays and Fridays. Recheck in one week.

## 2010-11-21 NOTE — Miscellaneous (Signed)
Summary: Orders Update  Clinical Lists Changes  Problems: Added new problem of VISUAL IMPAIRMENT, TRANSIENT [id number]) Orders: Added new Test order of Carotid Duplex (Carotid Duplex) - Signed

## 2010-11-21 NOTE — Progress Notes (Signed)
Summary: One touch test strips refill  Phone Note Refill Request Message from:  Fax from Pharmacy on July 17, 2010 8:36 AM  One Touch Ultra Test Strips-per MD ok to add to med list  Initial call taken by: Gardenia Phlegm RMA,  July 17, 2010 8:36 AM    New/Updated Medications: ONETOUCH TEST  STRP (GLUCOSE BLOOD) use with glucose monitor three times a day Prescriptions: ONETOUCH TEST  STRP (GLUCOSE BLOOD) use with glucose monitor three times a day  #1 month x 3   Entered by:   Gardenia Phlegm RMA   Authorized by:   Laurey Morale MD   Signed by:   Gardenia Phlegm RMA on 07/17/2010   Method used:   Electronically to        [facility] (retail)       [address]       [place], [place]  [address]       Ph: [phone] or [phone]       Fax: [phone]   RxID:   [id number]

## 2010-11-21 NOTE — Assessment & Plan Note (Signed)
Summary: BS running as high as 500/dm   Vital Signs:  Patient profile:   75 year old male Weight:      252 pounds Temp:     98.3 degrees F oral Pulse rate:   79 / minute BP sitting:   134 / 92  (left arm) Cuff size:   large  Vitals Entered By: Townsend Roger, [place] (November 20, 2009 3:20 PM) CC: bs was high this morning after eating a bowl of cereal, sinus infection CBG Result 478   History of Present Illness: Here to discuss very high glucose readings he has been getting at home for several weeks. He was here on 11-02-09 for an upper respiratory infection, and we put him on Levaquin and a prednisone taper. He is breathing better now, no SOB, no fevers, and the cough has calmed down a lot. However for the past 2 weeks his glucoses have shot up as high as 600 on several occasions. He feels fine except he is weak, he has mild nausea, and he has a HA. His glucose this  am fasting was 561. he continues on his usual oral meds. he watches his diet closely. A random glucose here is 478.   Current Medications (verified): 1)  Furosemide 40 Mg Tabs (Furosemide) .... 2 in Am and 1 in Pm 2)  Glyburide-Metformin 5-500 Mg Tabs (Glyburide-Metformin) .... Two  Tabs  Two Times A Day 3)  Ambien 10 Mg  Tabs (Zolpidem Tartrate) .... One At Bedtime 4)  Lipitor 20 Mg Tabs (Atorvastatin Calcium) .... Take 1 Tab By Mouth Daily 5)  Ultravate 0.05 %  Oint (Halobetasol Propionate) .... Apply Two Times A Day As Needed Rash 6)  Allopurinol 300 Mg  Tabs (Allopurinol) .... Once Daily 7)  Bystolic 20 Mg Tabs (Nebivolol Hcl) .Marland Kitchen.. 1 By Mouth Daily 8)  Tricor 145 Mg Tabs (Fenofibrate) .Marland Kitchen.. 1 By Mouth Once Daily 9)  Aspirin 81 Mg  Tabs (Aspirin) .... Daily 10)  Vicodin 5-500 Mg Tabs (Hydrocodone-Acetaminophen) .... One By Mouth 6 Hours As Needed 11)  Folic Acid 1 Mg Tabs (Folic Acid) .... Once Daily 12)  Flonase 50 Mcg/act Susp (Fluticasone Propionate) .... 2 Sprays Each Nostril Q Day  Allergies (verified): 1)   Amoxicillin (Amoxicillin) 2)  Penicillin V Potassium (Penicillin V Potassium)  Past History:  Past Medical History: Reviewed history from 11/02/2009 and no changes required. Coronary artery disease (non obstructive) Hyperlipidemia Hypertension Diabetes mellitus, type 2 LE edema Mild carotid artery disease, Dopplers in 2007 Gout Obesity Aortic Stenosis Renal insufficiency, sees Dr. Florene Glen  Review of Systems  The patient denies anorexia, fever, weight loss, weight gain, vision loss, decreased hearing, hoarseness, chest pain, syncope, dyspnea on exertion, peripheral edema, prolonged cough, headaches, hemoptysis, abdominal pain, melena, hematochezia, severe indigestion/heartburn, hematuria, incontinence, genital sores, muscle weakness, suspicious skin lesions, transient blindness, difficulty walking, depression, unusual weight change, abnormal bleeding, enlarged lymph nodes, angioedema, breast masses, and testicular masses.    Physical Exam  General:  Well-developed,well-nourished,in no acute distress; alert,appropriate and cooperative throughout examination Lungs:  Normal respiratory effort, chest expands symmetrically. Lungs are clear to auscultation, no crackles or wheezes. Heart:  Normal rate and regular rhythm. S1 and S2 normal without gallop, murmur, click, rub or other extra sounds.   Impression & Recommendations:  Problem # 1:  DIABETES MELLITUS, TYPE II (ICD-250.00) Assessment Deteriorated  His updated medication list for this problem includes:    Glyburide-metformin 5-500 Mg Tabs (Glyburide-metformin) .Marland Kitchen..Marland Kitchen Two  tabs  two times a day  Aspirin 81 Mg Tabs (Aspirin) .Marland Kitchen... Daily    Januvia 100 Mg Tabs (Sitagliptin phosphate) ..... Once daily    Lantus Solostar 100 Unit/ml Soln (Insulin glargine) ..... Use 30 units at bedtime  Orders: Venipuncture [id number]) Fingerstick (36416) Glucose, (CBG) [id number]) Admin of Therapeutic Inj  intramuscular or subcutaneous [id number]) Diabetic  Clinic Referral (Diabetic)  Problem # 2:  ACUTE BRONCHITIS (ICD-466.0) Assessment: Improved  Complete Medication List: 1)  Furosemide 40 Mg Tabs (Furosemide) .... 2 in am and 1 in pm 2)  Glyburide-metformin 5-500 Mg Tabs (Glyburide-metformin) .... Two  tabs  two times a day 3)  Ambien 10 Mg Tabs (Zolpidem tartrate) .... One at bedtime 4)  Lipitor 20 Mg Tabs (Atorvastatin calcium) .... Take 1 tab by mouth daily 5)  Ultravate 0.05 % Oint (Halobetasol propionate) .... Apply two times a day as needed rash 6)  Allopurinol 300 Mg Tabs (Allopurinol) .... Once daily 7)  Bystolic 20 Mg Tabs (Nebivolol hcl) .Marland Kitchen.. 1 by mouth daily 8)  Tricor 145 Mg Tabs (Fenofibrate) .Marland Kitchen.. 1 by mouth once daily 9)  Aspirin 81 Mg Tabs (Aspirin) .... Daily 10)  Vicodin 5-500 Mg Tabs (Hydrocodone-acetaminophen) .... One by mouth 6 hours as needed 11)  Folic Acid 1 Mg Tabs (Folic acid) .... Once daily 12)  Flonase 50 Mcg/act Susp (Fluticasone propionate) .... 2 sprays each nostril q day 13)  Januvia 100 Mg Tabs (Sitagliptin phosphate) .... Once daily 14)  Lantus Solostar 100 Unit/ml Soln (Insulin glargine) .... Use 30 units at bedtime  Patient Instructions: 1)  He was given 30 units of Novolog 75/25 insulin today. we will start him on Lantus to control his basal insulin levels. Start at 20 units at bedtime , but I am sure we will end up increasing this dose. Send him to a Diabetes Educator to learn how to use a Lantus Solostar pen. Continue the current oral meds. recheck in one week. Prescriptions: LANTUS SOLOSTAR 100 UNIT/ML SOLN (INSULIN GLARGINE) use 30 units at bedtime  #30 x 11   Entered and Authorized by:   Laurey Morale MD   Signed by:   Laurey Morale MD on 11/20/2009   Method used:   Electronically to        [facility] [phone][facility] (retail)       [address]       [place], [place]  [address]       Ph: [phone] or [phone]       Fax: [phone]   RxID:   [id number]   Laboratory Results   Blood  Tests   Date/Time Received: November 20, 2009 4:17 PM  CBG Random:: 478mg /dL  Comments: Townsend Roger, CMA  November 20, 2009 4:18 PM      Medication Administration  Injection # 1:    Medication: Insulin 5 units    Diagnosis: DIABETES MELLITUS, TYPE II (ICD-250.00)    Route: SQ    Site: stomach    Exp Date: 02/2011    Lot #: [id number]    Mfr: Lilly    Comments: Given 20u of Humalog 75/25.  Can't charge for it cause it was a drug sample    Patient tolerated injection without complications    Given by: Townsend Roger, [place] (November 20, 2009 5:03 PM)  Orders Added: 1)  Venipuncture [id number] 2)  Fingerstick [36416] 3)  Glucose, (CBG) [82962] 4)  Admin of Therapeutic Inj  intramuscular or subcutaneous [96372] 5)  Est. Patient Level IV [id number] 6)  Diabetic Clinic  Referral [Diabetic]

## 2010-11-21 NOTE — Cardiovascular Report (Signed)
Summary: Pre Cath Orders   Pre Cath Orders   Imported By: Sallee Provencal 03/28/2010 15:30:10  _____________________________________________________________________  External Attachment:    Type:   Image     Comment:   External Document

## 2010-11-21 NOTE — Progress Notes (Signed)
Summary: pt request call    Phone Note Call from Patient Call back at Home Phone [phone]   Caller: Patient Summary of Call: pt request call and want to set appt for cardioverison Initial call taken by: Delsa Sale,  September 25, 2010 8:16 AM  Follow-up for Phone Call        The pt said that 10/09/10 would be a good day for TEE/DCCV.  I will forward this information to [facility] RN.  Follow-up by: Theodosia Quay, RN, BSN,  September 25, 2010 8:41 AM  Additional Follow-up for Phone Call Additional follow up Details #1::        The plan is to first remove his dialysis catheter.  This will require holding the coumadin.  He would need to be back on this for 3 weeks with a therapeutic INR before we can do the cardioversion.  The more important issue is to get the catheter out Additional Follow-up by: Minus Breeding, MD, [facility],  September 26, 2010 4:52 PM    Additional Follow-up for Phone Call Additional follow up Details #2::    left message for pt to call back  Vic Ripper, RN  Spoke with pt who is aware plan for removal of catheter and that cardioversion will be planned only after he his been theraputic for 3 weeks.  He is aware of instruction and that they have been mailed to his home.  He knows to get a copy of them if he has not recieved them by Tuesday when he comes in for his PT/INR. He states understanding. Follow-up by: Sim Boast, RN,  September 28, 2010 4:15 PM

## 2010-11-21 NOTE — Medication Information (Signed)
Summary: Coumadin Clinic  Anticoagulant Therapy  Managed by: [name], PharmD Referring MD: Percival Spanish PCP: Alysia Penna, MD Supervising MD: Stanford Breed MD, Aaron Edelman Indication 1: Atrial Fibrillation Lab Used: [facility] [place] Site: [place] PT 36.9 INR POC 3.7 INR RANGE 2.0-3.0  Dietary changes: yes       Details: diet had to change after discharged from hospital  Health status changes: no    Bleeding/hemorrhagic complications: yes       Details: some nose bleeds about 10 days ago but resolved now  Recent/future hospitalizations: no    Any changes in medication regimen? no    Recent/future dental: no  Any missed doses?: no       Is patient compliant with meds? yes      Comments: Spoke with Caryl Pina from HD.  INR on 8/8 was 3.24.    Allergies: 1)  Penicillin V Potassium (Penicillin V Potassium)  Anticoagulation Management History:      His anticoagulation is being managed by telephone today.  Positive risk factors for bleeding include an age of 72 years or older and presence of serious comorbidities.  The bleeding index is 'intermediate risk'.  Positive CHADS2 values include History of CHF, History of HTN, and History of Diabetes.  Negative CHADS2 values include Age > 26 years old.  His last INR was 1.4 ratio.  Prothrombin time is 36.9.  Anticoagulation responsible provider: Stanford Breed MD, Aaron Edelman.  INR POC: 3.7.    Anticoagulation Management Assessment/Plan:      The patient's current anticoagulation dose is Coumadin 2 mg tabs: as directed.  The target INR is 2.0-3.0.  The next INR is due 06/06/2010.  Anticoagulation instructions were given to patient.  Results were reviewed/authorized by [name], PharmD.  He was notified by [name] PharmD.         Prior Anticoagulation Instructions: INR 2.58  Called spoke with pt's friend and pt and  advised to continue on same dosage, Recheck in 1 week at dialysis.  Orders faxed.  Current Anticoagulation  Instructions: INR 3.7  LMOM for pt [name] PharmD  May 30, 2010 2:36 PM   Spoke with pt.  Skip tomorrow's dose of Coumadin then decrease dose to 1 tablet every day except 1/2 tablet on Monday and Friday.  Recheck INR in 1 week.  Orders sent to [facility].

## 2010-11-21 NOTE — Medication Information (Signed)
Summary: rov/tm  Anticoagulant Therapy  Managed by: Tula Nakayama, RN, BSN Referring MD: Percival Spanish PCP: Alysia Penna, MD Supervising MD: Johnsie Cancel MD, Collier Salina Indication 1: Atrial Fibrillation Lab Used: [facility] [place] Site: [place] INR POC 4.1 INR RANGE 2.0-3.0  Vital Signs: Blood Pressure:  114 / 62   Dietary changes: no    Health status changes: yes       Details: Experiencing some diarrhea since discharge   Bleeding/hemorrhagic complications: no    Recent/future hospitalizations: yes       Details: [facility] on 06/16/10 from [facility]  Any changes in medication regimen? yes       Details: Amiodarone decrease at OV on 06/05/10 to 200mg s daily from 200mg s BID  Recent/future dental: no  Any missed doses?: no       Is patient compliant with meds? yes      Comments: INR on 06/14/10 admission date 1.92   Allergies: 1)  Penicillin V Potassium (Penicillin V Potassium)  Anticoagulation Management History:      The patient is taking warfarin and comes in today for a routine follow up visit.  Positive risk factors for bleeding include an age of 13 years or older and presence of serious comorbidities.  The bleeding index is 'intermediate risk'.  Positive CHADS2 values include History of CHF, History of HTN, and History of Diabetes.  Negative CHADS2 values include Age > 30 years old.  His last INR was 1.4 ratio.  Anticoagulation responsible provider: Johnsie Cancel MD, Collier Salina.  INR POC: 4.1.  Cuvette Lot#: [id number].  Exp: 07/2011.    Anticoagulation Management Assessment/Plan:      The patient's current anticoagulation dose is Coumadin 2 mg tabs: as directed.  The target INR is 2.0-3.0.  The next INR is due 06/28/2010.  Anticoagulation instructions were given to patient.  Results were reviewed/authorized by Tula Nakayama, RN, BSN.  He was notified by Tula Nakayama, RN, BSN.         Prior Anticoagulation Instructions: INR 4.5  Called spoke with pt's caregiver, advised to hold x 2  doses, then decr dosage to 1 tablet daily except 1/2 tablet on Mondays, Wednesdays, and Fridays.  Recheck in 1 week.    Current Anticoagulation Instructions: INR 4.1 Skip today then change dose to 1 tablet everyday except 1/2 tablet on Monday and Friday. Recheck in one week.

## 2010-11-21 NOTE — Letter (Signed)
Summary: [facility]   Imported By: Sallee Provencal 11/20/2009 15:52:06  _____________________________________________________________________  External Attachment:    Type:   Image     Comment:   External Document

## 2010-11-21 NOTE — Progress Notes (Signed)
Summary: Needs to change sleep med  Phone Note Call from Patient   Caller: Patient Reason for Call: Talk to Nurse Summary of Call: Needs to stop taking Ambein - it is causing behavioral issues.  Patient is requesting an RX for Restoril instead.  Pharmacy is [facility] [address] [phone] Initial call taken by: Kennon Rounds,  January 15, 2010 2:54 PM  Follow-up for Phone Call        call in Temazepam 30 mg at bedtime , #30 with 5 rf Follow-up by: Laurey Morale MD,  January 15, 2010 3:11 PM  Additional Follow-up for Phone Call Additional follow up Details #1::        Rx Called In Additional Follow-up by: Chipper Oman, RN,  January 15, 2010 3:23 PM    New/Updated Medications: TEMAZEPAM 30 MG CAPS (TEMAZEPAM) 1 at bedtime Prescriptions: TEMAZEPAM 30 MG CAPS (TEMAZEPAM) 1 at bedtime  #30 x 5   Entered by:   Chipper Oman, RN   Authorized by:   Laurey Morale MD   Signed by:   Chipper Oman, RN on 01/15/2010   Method used:   Historical   [id number]

## 2010-11-21 NOTE — Miscellaneous (Signed)
Summary: [facility]  [facility] Report   Imported By: Laural Benes 08/31/2010 10:29:09  _____________________________________________________________________  External Attachment:    Type:   Image     Comment:   External Document

## 2010-11-21 NOTE — Medication Information (Signed)
Summary: rov/sl  Anticoagulant Therapy  Managed by: [name], PharmD Referring MD: Percival Spanish PCP: Alysia Penna, MD Supervising MD: Ron Parker MD, Dellis Filbert Indication 1: Atrial Fibrillation Lab Used: [facility] [place] Site: [place] INR POC 1.6 INR RANGE 2.0-3.0  Dietary changes: no    Health status changes: no    Bleeding/hemorrhagic complications: no    Recent/future hospitalizations: no    Any changes in medication regimen? no    Recent/future dental: no  Any missed doses?: no       Is patient compliant with meds? yes       Allergies: 1)  Penicillin V Potassium (Penicillin V Potassium)  Anticoagulation Management History:      The patient is taking warfarin and comes in today for a routine follow up visit.  Positive risk factors for bleeding include an age of 73 years or older and presence of serious comorbidities.  The bleeding index is 'intermediate risk'.  Positive CHADS2 values include History of CHF, History of HTN, and History of Diabetes.  Negative CHADS2 values include Age > 73 years old.  His last INR was 1.4 ratio.  Anticoagulation responsible provider: Ron Parker MD, Dellis Filbert.  INR POC: 1.6.  Cuvette Lot#: [id number].  Exp: 08/2011.    Anticoagulation Management Assessment/Plan:      The patient's current anticoagulation dose is Coumadin 2 mg tabs: as directed.  The target INR is 2.0-3.0.  The next INR is due 08/14/2010.  Anticoagulation instructions were given to patient.  Results were reviewed/authorized by [name], PharmD.  He was notified by Griffith Citron D candidate.         Prior Anticoagulation Instructions: INR 2.2 Take 1 tablet everyday. Recheck in 1 week.   Current Anticoagulation Instructions: INR 1.6  Take an extra tablet today. Then take 1 tablet everyday except 1 1/2 tablet on Wednesday. Recheck in 1 week.

## 2010-11-21 NOTE — Assessment & Plan Note (Signed)
Summary: F/U ON SINUS PROBLEMS // RS   Vital Signs:  Patient profile:   75 year old male Weight:      270 pounds BMI:     36.25 O2 Sat:      96 % on Room air Temp:     97.6 degrees F oral Pulse rate:   112 / minute Pulse rhythm:   regular BP sitting:   102 / 78  (left arm) Cuff size:   large  Vitals Entered By: Chipper Oman, RN (Mar 07, 2010 3:11 PM)  O2 Flow:  Room air CC: C/o sinus and dry cough. Having heart cath June 2nd   History of Present Illness: Here with increasing SOB, fatigue, and non productive cough. He is set to have a cardiac cath on 03-22-10, and he has been seeing Dr. Percival Spanish frequently. He has severe aortic stenosis, and depending on the cath results may need surgery for this. His cardiac output has been low but his BP has been low also. In fact Dr. Percival Spanish recently stopped his Bystolic because of this. [name] Brown has had a lot of coughing lately, and he thinks this is due to PND. He is taking Zyrtec and Mucinex daily, but he ran out of Flonase a month ago. No fevers. No chest pains. He has been gaining weight and accumulating fluid in the lower back, abdomen, and legs.   Allergies: 1)  Penicillin V Potassium (Penicillin V Potassium)  Past History:  Past Medical History: Reviewed history from 01/15/2010 and no changes required. Coronary artery disease (non obstructive) Non- ST elevation MI   11-2009(secondary to supply demand mismatch) SEVERE AORTIC STENOSIS Hyperlipidemia Hypertension Diabetes mellitus, type 2 LE edema Mild carotid artery disease, Dopplers in 2007 Gout Obesity Renal insufficiency, sees Dr. Florene Glen Idiopathic hepatitis 11-2009  Past Surgical History: Reviewed history from 01/10/2010 and no changes required. Bone spurs removed from right heel Colonoscopy 05-09-06 per Dr. Ardis Hughs, repeat 3 years ADENOMATOUS Appendectomy  Review of Systems  The patient denies anorexia, fever, weight loss, vision loss, decreased hearing, hoarseness, chest  pain, syncope, prolonged cough, headaches, hemoptysis, abdominal pain, melena, hematochezia, severe indigestion/heartburn, hematuria, incontinence, genital sores, muscle weakness, suspicious skin lesions, transient blindness, difficulty walking, depression, unusual weight change, abnormal bleeding, enlarged lymph nodes, angioedema, breast masses, and testicular masses.    Physical Exam  General:  overweight-appearing.  A bit weak, but alert Head:  Normocephalic and atraumatic without obvious abnormalities. No apparent alopecia or balding. Eyes:  No corneal or conjunctival inflammation noted. EOMI. Perrla. Funduscopic exam benign, without hemorrhages, exudates or papilledema. Vision grossly normal. Nose:  External nasal examination shows no deformity or inflammation. Nasal mucosa are pink and moist without lesions or exudates. Mouth:  Oral mucosa and oropharynx without lesions or exudates.  Teeth in good repair. Neck:  No deformities, masses, or tenderness noted. Lungs:  clear except for for some rales at both bases Heart:  normal rate, regular rhythm, no gallop, no rub, no JVD, and no HJR.  Has a 3/6 SM loudest at the aortic area Abdomen:  non-tender, normal bowel sounds, no masses, no guarding, and no rigidity.  Has some edema in the lower abdomen and over the sacrum Extremities:  2+ left pedal edema and 2+ right pedal edema.     Impression & Recommendations:  Problem # 1:  CONGESTIVE HEART FAILURE (ICD-428.00)  The following medications were removed from the medication list:    Bystolic 20 Mg Tabs (Nebivolol hcl) .Marland Kitchen... 1 by mouth daily His updated  medication list for this problem includes:    Furosemide 40 Mg Tabs (Furosemide) .Marland Kitchen... 2 in am and 1 in pm    Aspirin 81 Mg Tabs (Aspirin) .Marland Kitchen... Daily  Problem # 2:  RENAL INSUFFICIENCY (ICD-588.4)  Problem # 3:  ALLERGIC RHINITIS (ICD-477.9)  His updated medication list for this problem includes:    Flonase 50 Mcg/act Susp (Fluticasone  propionate) .Marland Kitchen... 2 sprays each nostril q day    Promethazine Hcl 25 Mg Tabs (Promethazine hcl) .Marland Kitchen... 1 q 4 hours as needed nausea  Orders: Prescription Created Electronically [phone])  Problem # 4:  AORTIC VALVE DISORDERS (ICD-424.1)  The following medications were removed from the medication list:    Bystolic 20 Mg Tabs (Nebivolol hcl) .Marland Kitchen... 1 by mouth daily His updated medication list for this problem includes:    Aspirin 81 Mg Tabs (Aspirin) .Marland Kitchen... Daily  Problem # 5:  HYPERTENSION (ICD-401.9)  The following medications were removed from the medication list:    Bystolic 20 Mg Tabs (Nebivolol hcl) .Marland Kitchen... 1 by mouth daily His updated medication list for this problem includes:    Furosemide 40 Mg Tabs (Furosemide) .Marland Kitchen... 2 in am and 1 in pm  Complete Medication List: 1)  Furosemide 40 Mg Tabs (Furosemide) .... 2 in am and 1 in pm 2)  Allopurinol 300 Mg Tabs (Allopurinol) .... Once daily 3)  Aspirin 81 Mg Tabs (Aspirin) .... Daily 4)  Folic Acid 1 Mg Tabs (Folic acid) .... Once daily 5)  Flonase 50 Mcg/act Susp (Fluticasone propionate) .... 2 sprays each nostril q day 6)  Lantus 100 Unit/ml Soln (Insulin glargine) .... 20 units at bedtime 7)  Promethazine Hcl 25 Mg Tabs (Promethazine hcl) .Marland Kitchen.. 1 q 4 hours as needed nausea 8)  Colcrys 0.6 Mg Tabs (Colchicine) .... Take as needed for gout 9)  Potassium Chloride Crys Cr 20 Meq Cr-tabs (Potassium chloride crys cr) .... Take two today and then one once a day for 2 days 10)  Hydromet 5-1.5 Mg/63ml Syrp (Hydrocodone-homatropine) .Marland Kitchen.. 1 tsp q 4 hours as needed cough  Patient Instructions: 1)  Keep his cardiac meds the same. Proceed with catherterization as planned. Get back on Flonase daily. He is averaging 5 glasses of fluid a day, and I asked him to keep it at thhis level.  2)  Please schedule a follow-up appointment as needed .  Prescriptions: FLONASE 50 MCG/ACT SUSP (FLUTICASONE PROPIONATE) 2 sprays each nostril q day  #30 x 11   Entered  and Authorized by:   Laurey Morale MD   Signed by:   Laurey Morale MD on 03/07/2010   Method used:   Electronically to        [facility] [phone] (retail)       [place], [place]  [address]       Ph: [phone] or [phone]       Fax: [phone]   RxID:   [id number] HYDROMET 5-1.5 MG/5ML SYRP (HYDROCODONE-HOMATROPINE) 1 tsp q 4 hours as needed cough  #240 x 0   Entered and Authorized by:   Laurey Morale MD   Signed by:   Laurey Morale MD on 03/07/2010   Method used:   Print then Give to Patient   RxID:   [id number]

## 2010-11-21 NOTE — Progress Notes (Signed)
Summary: Calling regarding medication  Phone Note Call from Patient Call back at Home Phone [phone]   Caller: Patient Summary of Call: Pt calling regarding medication Initial call taken by: Delsa Sale,  June 18, 2010 9:34 AM  Follow-up for Phone Call        spoke w/pt regarding his coumadin and his recent hospitalization w/cva, his INR will be checked today and coumadin clinic will call him to discuss his dose, he also asked about his labwork drawn before he left hospital, expained potassium was low he states he is not on potassium, per d/c summary and med manager pt was to start klc 15meq two times a day and he was given prescription but states he never received a prescription will call in to [facility], RN  June 18, 2010 10:10 AM     New/Updated Medications: POTASSIUM CHLORIDE CR 10 MEQ CR-CAPS (POTASSIUM CHLORIDE) Take one tablet by mouth twice a day Prescriptions: POTASSIUM CHLORIDE CR 10 MEQ CR-CAPS (POTASSIUM CHLORIDE) Take one tablet by mouth twice a day  #60 x 6   Entered by:   Kevan Rosebush, RN   Authorized by:   Minus Breeding, MD, [facility]   Signed by:   Kevan Rosebush, RN on 06/18/2010   Method used:   Electronically to        [facility] (retail)       [address]       [place], [place]  [address]       Ph: [phone] or [phone]       Fax: [phone]   RxID:   [id number]

## 2010-11-21 NOTE — Miscellaneous (Signed)
Summary: Physician Interim Orders/[facility]  Physician Interim Orders/[facility]   Imported By: Laural Benes 07/19/2010 12:55:23  _____________________________________________________________________  External Attachment:    Type:   Image     Comment:   External Document

## 2010-11-21 NOTE — Progress Notes (Signed)
Summary: [facility] needs verbal approval to continue care  Phone Note From Other Clinic Call back at [place]: [place] Summary of Call: [facility] called and needs a verbal approval for continuation of care to resume previous orders for pts gait, strength and endurance.   Initial call taken by: Braulio Bosch,  June 20, 2010 12:02 PM  Follow-up for Phone Call        we did this earlier today Follow-up by: Laurey Morale MD,  June 20, 2010 4:22 PM

## 2010-11-21 NOTE — Letter (Signed)
Summary: Generic Letter  [facility]  [address] [place]   [place], [place] [address]   Phone: [phone]  Fax: [phone]          September 28, 2010 MRN: [id number]    [place], [place]  [address]    Dear Mr. Betancur,  YOu have been scheduled to have the right internal jugulat catheter removed on October 04, 2010.  Please report to the 1st floor patient admitting department at 7am.  The procedure will be will done at 7:30am.  DO NOT EAT OR DRINK AFTER MIDNIGHT THE NIGHT BEFORE!!!!!  Please call with any questions.         Sincerely,  Sim Boast, RN for Dr Minus Breeding   This letter has been electronically signed by your physician.

## 2010-11-21 NOTE — Progress Notes (Signed)
Summary: appt Dr Percival Spanish  Phone Note Refill Request Message from:  Pharmacy on January 05, 2010 9:55 AM  Refills Requested: Medication #1:  HYDROMET 5-1.5 MG/5ML SYRP 1 tsp q 4 hours as needed cough.  Method Requested: Fax to [facility] Initial call taken by: Chipper Oman, RN,  January 05, 2010 9:55 AM  Follow-up for Phone Call        call in another 240 ml bottle of Hydromet. When is he scheduled to see Dr. Percival Spanish again? Follow-up by: Laurey Morale MD,  January 05, 2010 10:16 AM  Additional Follow-up for Phone Call Additional follow up Details #1::        Rx faxed to pharmacy and [facility] to call back. Additional Follow-up by: Chipper Oman, RN,  January 05, 2010 10:24 AM    Additional Follow-up for Phone Call Additional follow up Details #2::    Dr Percival Spanish 3/28. Follow-up by: Chipper Oman, RN,  January 05, 2010 1:56 PM

## 2010-11-21 NOTE — Progress Notes (Signed)
Summary: refills.Marland KitchenMarland KitchenMarland KitchenOther doctors suggested Dr Sarajane Jews fill all meds.  Phone Note Call from Patient   Caller: Patient Call For: Laurey Morale MD Summary of Call: Pt wants Oxycontin and Xanax refilled post hospital. [phone] Initial call taken by: Deanna Artis CMA,  May 29, 2010 10:21 AM  Follow-up for Phone Call        done Follow-up by: Laurey Morale MD,  May 29, 2010 11:02 AM    Prescriptions: ALPRAZOLAM 0.5 MG TABS (ALPRAZOLAM) 1 q6h as needed  #60 x 5   Entered and Authorized by:   Laurey Morale MD   Signed by:   Laurey Morale MD on 05/29/2010   Method used:   Print then Give to Patient   RxID:   [id number] OXYCODONE HCL 5 MG TABS (OXYCODONE HCL) 1 q4h as needed  #120 x 0   Entered and Authorized by:   Laurey Morale MD   Signed by:   Laurey Morale MD on 05/29/2010   Method used:   Print then Give to Patient   RxID:   [id number]

## 2010-11-21 NOTE — Medication Information (Signed)
Summary: rov/nb  Anticoagulant Therapy  Managed by: [name], PharmD Referring MD: Percival Spanish PCP: Alysia Penna, MD Supervising MD: Caryl Comes MD, Remo Lipps Indication 1: Atrial Fibrillation Lab Used: [facility] [place] Site: [place] INR POC 2.9 INR RANGE 2.0-3.0  Dietary changes: no    Health status changes: yes       Details: Recent dx of decreased lung capacity, f/u with MD is scheduled  Bleeding/hemorrhagic complications: no    Recent/future hospitalizations: no    Any changes in medication regimen? no    Recent/future dental: no  Any missed doses?: no       Is patient compliant with meds? yes       Allergies (verified): 1)  Penicillin V Potassium (Penicillin V Potassium)  Anticoagulation Management History:      The patient is taking warfarin and comes in today for a routine follow up visit.  Positive risk factors for bleeding include an age of 60 years or older and presence of serious comorbidities.  The bleeding index is 'intermediate risk'.  Positive CHADS2 values include History of CHF, History of HTN, and History of Diabetes.  Negative CHADS2 values include Age > 79 years old.  His last INR was 1.4 ratio.  Anticoagulation responsible provider: Caryl Comes MD, Remo Lipps.  INR POC: 2.9.  Cuvette Lot#: [id number].  Exp: 08/2011.    Anticoagulation Management Assessment/Plan:      The patient's current anticoagulation dose is Coumadin 2 mg tabs: as directed.  The target INR is 2.0-3.0.  The next INR is due 09/04/2010.  Anticoagulation instructions were given to patient.  Results were reviewed/authorized by [name], PharmD.  He was notified by Carmin Richmond, PharmD Candidate.         Prior Anticoagulation Instructions: INR 2.8 Take 1.5 tablets on Monday, Wednesday, and Friday. Take 1 tablet all other days. Recheck in 1 week.  Current Anticoagulation Instructions: INR 2.9 Continue previous dose of 1 tablet everyday except 1.5 tablets on Monday, Wednesday, and  Thursday. Recheck INR in 1 week.

## 2010-11-21 NOTE — Assessment & Plan Note (Signed)
Summary: 2 MONTH ROV.SL   Visit Type:  Follow-up Referring Provider:  n/a Primary Provider:  Alysia Penna, MD  CC:  CAD/AVR/Atrial Fib.  History of Present Illness: The patient presents for followup of the above. This is his first followup with me since he was hospitalized with a CVA in August. This was non-hemorrhagic subcortical/cortical and resulted in no residual. Since that time he has been tolerating hemodialysis though he doesn't like it. He has had no new shortness of breath, PND or orthopnea. He has had no new chest pressure, neck or arm discomfort. He doesn't notice any palpitations, presyncope or syncope. He has had no weight gain and only mild lower extremity swelling. He's actually lost about 90 pounds through his illnesses this year.  Current Medications (verified): 1)  Flonase 50 Mcg/act Susp (Fluticasone Propionate) .... 2 Sprays Each Nostril Q Day 2)  Lantus 100 Unit/ml Soln (Insulin Glargine) .... 20 Units At Bedtime 3)  Colcrys 0.6 Mg Tabs (Colchicine) .... Take As Needed For Gout 4)  Amiodarone Hcl 200 Mg Tabs (Amiodarone Hcl) .Marland Kitchen.. 1 Tablet By Mouth Once A Day 5)  Levothroid 25 Mcg Tabs (Levothyroxine Sodium) .Marland Kitchen.. 1 By Mouth Daily 6)  Metoprolol Succinate 50 Mg Xr24h-Tab (Metoprolol Succinate) .... Take One Tablet By Mouth Every Evening- Hold For Systolic Blood Pressure Less Than 100. 7)  Vol-Care Rx 1 Mg Tabs (B Complex-C-Folic Acid) .... Take 1 Tablet By Mouth Once A Day 8)  Ultram 50 Mg Tabs (Tramadol Hcl) .... As Needed 9)  Coumadin 2 Mg Tabs (Warfarin Sodium) .... As Directed 10)  Allopurinol 300 Mg Tabs (Allopurinol) .Marland Kitchen.. 1 Once Daily 11)  Digoxin 0.125 Mg Tabs (Digoxin) .Marland Kitchen.. 1 Every Other Day 12)  Simvastatin 20 Mg Tabs (Simvastatin) .... Once Daily 13)  Alprazolam 0.5 Mg Tabs (Alprazolam) .Marland Kitchen.. 1 Q6h As Needed 14)  Oxycodone Hcl 5 Mg Tabs (Oxycodone Hcl) .Marland Kitchen.. 1 Q4h As Needed 15)  Miralax  Powd (Polyethylene Glycol 3350) .... Once Daily 16)  Onetouch Test  Strp  (Glucose Blood) .... Use With Glucose Monitor Three Times A Day 17)  Nepro  Liqd 8 Oz (Nutritional Supplements) .... Once Daily 18)  Fish Oil 1000 Mg Caps (Omega-3 Fatty Acids) .... Take 1 Cap By Mouth Daily.  Allergies (verified): 1)  Penicillin V Potassium (Penicillin V Potassium)  Past History:  Past Medical History: Coronary artery disease, sees Dr. Percival Spanish Non- ST elevation MI   11-2009(secondary to supply demand mismatch) hx of SEVERE AORTIC STENOSIS Hyperlipidemia Hypertension Diabetes mellitus, type 2 LE edema Mild carotid artery disease, Dopplers in 2007 Gout Obesity Chronic renal failure, on dialysis, sees Dr. Florene Glen. He has also seen Dr. Dimas Chyle at [facility] Idiopathic hepatitis 11-2009 Atrial fibrillation Subcortical/cortical nonhemorrhagic CVA August 2011  Review of Systems       As stated in the HPI and negative for all other systems.   Vital Signs:  Patient profile:   75 year old male Height:      72.5 inches Weight:      201 pounds BMI:     26.98 Pulse rate:   51 / minute Resp:     16 per minute BP sitting:   127 / 63  (left arm)  Vitals Entered By: Levora Angel, CNA (July 26, 2010 12:31 PM)  Physical Exam  General:  Well developed, well nourished, in no acute distress. Head:  normocephalic and atraumatic Eyes:  PERRLA/EOM intact; conjunctiva and lids normal. Neck:  Neck supple, no JVD. No  masses, thyromegaly or abnormal cervical nodes. Chest Wall:  Well-healed sternotomy scar,dialysis catheter right upper chest Lungs:  Clear bilaterally to auscultation and percussion. Abdomen:  Bowel sounds positive; abdomen soft and non-tender without masses, organomegaly, or hernias noted. No hepatosplenomegaly. Msk:  Back normal, normal gait. Muscle strength and tone reduced with generalized weakness and muscle wasting Extremities:  bilateral mild lower extremity edema, right forearm dialysis graft with thrill and bruit Neurologic:  Alert and oriented  x 3. Skin:  Intact without lesions or rashes. Cervical Nodes:  no significant adenopathy Psych:  Normal affect.   Detailed Cardiovascular Exam  Neck    Carotids: Carotids full and equal bilaterally without bruits.      Neck Veins: Normal, no JVD.    Heart    Inspection: no deformities or lifts noted.      Palpation: normal PMI with no thrills palpable.      Auscultation: S1 and S2 within normal limits, no S3, irregular, 2/6 apical systolic murmur early peaking, no diastolic murmurs  Vascular    Abdominal Aorta: no palpable masses, pulsations, or audible bruits.      Femoral Pulses: normal femoral pulses bilaterally.      Pedal Pulses: normal pedal pulses bilaterally.      Radial Pulses: normal radial pulses bilaterally.      Peripheral Circulation: no clubbing, cyanosis, or edema noted with normal capillary refill.     EKG  Procedure date:  07/26/2010  Findings:      atrial fibrillation, left axis deviation, left anterior fascicular block, poor anterior R-wave progression  Impression & Recommendations:  Problem # 1:  ATRIAL FIBRILLATION (ICD-427.31) At this point I will leave him on any other on and make sure he has had therapeutic Coumadin this and then plan elective cardioversion to see if we can get him to maintain sinus rhythm. Orders: EKG w/ Interpretation (93000)  Problem # 2:  CONGESTIVE HEART FAILURE (ICD-428.00) Volume is managed per dialysis. I'll make no change to his regimen.  Problem # 3:  AORTIC VALVE DISORDERS (ICD-424.1) He is status post aortic valve replacement. No change in therapy is indicated.  Problem # 4:  CORONARY ARTERY DISEASE (ICD-414.00) He does have coronary disease and we will continue with risk reduction. He is not on aspirin with his Coumadin.  Patient Instructions: 1)  Your physician recommends that you schedule a follow-up appointment after cardioversion 2)  Your physician recommends that you continue on your current medications as  directed. Please refer to the Current Medication list given to you today. 3)  Your physician has recommended that you have a cardioversion (DCCV).  Electrical cardioversion uses a jolt of electricity to your heart either through paddles or wired patches attached to your chest. This is a controlled, usually prescheduled, procedure. Defibrillation is done under light anesthesia in the hospital, and you usually go home the day of the procedure. This is done to get your heart back into a normal rhythm. You are not awake for the procedure. Please see the instruction sheet given to you today.

## 2010-11-21 NOTE — Procedures (Signed)
Summary: EUS   EUS  Procedure date:  09/23/2005  Findings:      Location: [facility]   Patient Name: [name] Brown, [name] Brown MRN: [id number] Procedure Procedures: Panendoscopy with EUSCPT: [id number].   with Fine Needle Aspiration Personnel: Endoscopist: Milus Banister, MD.  Exam Location: Exam performed in Endoscopy Suite. Outpatient  Patient Consent: Procedure, Alternatives, Risks and Benefits discussed, consent obtained, from patient. Consent was obtained by the RN.  Indications  Assessment: Incidental cystic lesion in head of pancreas.  Diagnostic Sampling: Fine needle aspiration (FNA).  Therapy: Cyst aspiration.  History  Current Medications: Patient is not currently taking Coumadin.  Pre-Exam Physical: Performed Sep 23, 2005. Cardio-pulmonary exam, Abdominal exam, Mental status exam WNL.  Exam Exam: Images were taken.  Patient: ASA Classification: II. Tolerance: good.  Sedation Meds:  ~OBJECTIVE5Sedation Meds Patient assessed and found to be appropriate for moderate (conscious) sedation. Demerol 175 given IV. Versed 8 mg. given IV. Promethazine 25 mg given IV. Cipro 400 given IV.  Monitoring: BP and pulse monitoring done. Oximetry was used. Supplemental O2 given.  EUS Scopes: Radial Echoendoscope used Curvilinear Array Echoendoscope used   Comments: Endoscopic findings: 1. Non-obstructing Schatzki's ring in otherwise normal esophagus. 2. Normal stomach. 3. Normal duodenum.  EUS findings (using Pentax radial and linear echoendoscopes): 1. Cystic lesion in head of pancreas measuring 19mm by 7mm.  There were thin walled, internal septations creating two main cysts (each approximately 1cm across).  There was no associated mass.  2. Pancreatic parenchyma was otherwise normal in head, neck, body and tail of gland. 3. Main pancreatic duct was normal; non-dilated. 4. CBD directly abuted the cystic lesion and was non-dilated. 5. Gallbladder contained  a few small, shadowing echenic foci, consistent with gallstones. 6. Limited views of liver, spleen, celiac axis, portal vein, splenic vessels were normal. 7. No peripancreatic, celiac or pathologic mediastinal adenopathy.  Following EUS examination FNA was performed on the cystic lesion in head of pancreas.  Color doppler was used to avoid nearby vascular structures.  A single pass with 22 guage Echo-tip FNA needle was performed. 4cc of clear, somewhat thick fluid was removed.  This was sent for CEA, amylase, and cytology (to check for presence of mucin and/or malignant cells).  The cyst was completely aspirated.  Assessment  Biliary/Pancreatic Abnormal examination, see findings above.  Comments: Cystic lesion in head of pancreas.  Somewhat thick clear fluid is often a sign of mucinous neoplasm.  Will await cyst fluid analysis for final recommendations.  Ddx includes serous cystadenoma, mucinous cystadenoma, IPMN. Events  Unplanned Intervention: No intervention was required.  Unplanned Events: The patient had the following complications:  Plans Comments: Await final cyst fluid analysis.  He will complete a 3 day course of Cipro 500mg  PO BID to decrease risk of infection. This report was created from the original endoscopy report, which was reviewed and signed by the above listed endoscopist.

## 2010-11-21 NOTE — Progress Notes (Signed)
Summary: refill request  Phone Note Refill Request Message from:  Patient on May 29, 2010 9:42 AM  Refills Requested: Medication #1:  AMIODARONE HCL 200 MG TABS 1 by mouth two times a day  Medication #2:  DIGOXIN 0.125 MG TABS 1 every other day  Medication #3:  METOPROLOL SUCCINATE 50 MG XR24H-TAB Take one tablet by mouth every evening- hold for systolic blood pressure less than 100.  Medication #4:  OXYCODONE HCL 5 MG TABS 1 q4h as needed and alprazolam 0.5 mg  [facility]   Method Requested: Telephone to Pharmacy Initial call taken by: Lorenda Hatchet,  May 29, 2010 9:42 AM  Follow-up for Phone Call        Rx faxed to pharmacy. Pt. advised to request  oxycodone and alprazolam  refills to his primary care MD. Sidney Ace  May 29, 2010 9:55 AM     Prescriptions: DIGOXIN 0.125 MG TABS (DIGOXIN) 1 every other day  #90 x 3   Entered by:   Sidney Ace   Authorized by:   Fatima Sanger, MD, [facility]   Signed by:   Sidney Ace on 05/29/2010   Method used:   [facility]ed to ...       [facility]       [address]       [place], [place]  [address]       Ph: [phone] or [phone]       Fax: [phone]   RxID:   [id number] METOPROLOL SUCCINATE 50 MG XR24H-TAB (METOPROLOL SUCCINATE) Take one tablet by mouth every evening- hold for systolic blood pressure less than 100.  #90 x 3   Entered by:   Sidney Ace   Authorized by:   Fatima Sanger, MD, [facility]   Signed by:   Sidney Ace on 05/29/2010   Method used:   Faxed to ...       [facility] (retail)       [address]       [place], [place]  [address]       Ph: [phone] or [phone]       Fax: [phone]   RxID:   [id number] AMIODARONE HCL 200 MG TABS (AMIODARONE HCL) 1 by mouth two times a day  #180 x 3   Entered by:   Sidney Ace   Authorized by:   Fatima Sanger, MD, [facility]   Signed by:   Sidney Ace on 05/29/2010   Method used:   Faxed to ...       [facility] [address] (retail)       [address]       [place], [place]  [address]       Ph: [phone] or [phone]       Fax: [phone]   RxID:   [id number]

## 2010-11-21 NOTE — Miscellaneous (Signed)
Summary: Medication Issues/[facility]  Medication Issues/[facility]   Imported By: Laural Benes 05/30/2010 15:37:55  _____________________________________________________________________  External Attachment:    Type:   Image     Comment:   External Document

## 2010-11-21 NOTE — Assessment & Plan Note (Signed)
Summary: follow up on diabetes/cjr   Vital Signs:  Patient profile:   75 year old male Weight:      197 pounds Temp:     98.7 degrees F oral BP sitting:   120 / 62  (left arm) Cuff size:   regular  Vitals Entered By: Clearnce Sorrel CMA (July 17, 2010 10:54 AM) CC: fu diabetes, sore on chest, med question, src Is Patient Diabetic? Yes   History of Present Illness: Here to follow up on numerous issues. He saw Dr. Dimas Chyle at [facility], and he was very encouraging as far as his renal status goes. His kidneys probably will not ever get better, but he could be an excellent candidate for a kidney transplant. His case is now being reviewed by the Transplant committee. His glucoses have been stable with fasting readings from 90 to 130. He is not quite as weak as he waus the last time I saw him. He continues to get dialysis 3 days a week.   Preventive Screening-Counseling & Management  Alcohol-Tobacco     Smoking Status: never  Allergies: 1)  Penicillin V Potassium (Penicillin V Potassium)  Past History:  Past Medical History: Coronary artery disease, sees Dr. Percival Spanish Non- ST elevation MI   11-2009(secondary to supply demand mismatch) hx of SEVERE AORTIC STENOSIS Hyperlipidemia Hypertension Diabetes mellitus, type 2 LE edema Mild carotid artery disease, Dopplers in 2007 Gout Obesity chronic renal failure, on dialysis, sees Dr. Florene Glen. He has also seen Dr. Dimas Chyle at [facility] Idiopathic hepatitis 11-2009 Atrial fibrillation  Past Surgical History: Reviewed history from 05/24/2010 and no changes required. Bone spurs removed from right heel Colonoscopy 05-09-06 per Dr. Ardis Hughs, repeat 3 years ADENOMATOUS Appendectomy Coronary artery bypass grafts times 3 with tissue aortic valve replacement 04-04-10 per Dr. Roxan Hockey  Review of Systems  The patient denies anorexia, fever, weight loss, weight gain, vision loss, decreased hearing, hoarseness, chest pain,  syncope, dyspnea on exertion, peripheral edema, prolonged cough, headaches, hemoptysis, abdominal pain, melena, hematochezia, severe indigestion/heartburn, hematuria, incontinence, genital sores, muscle weakness, suspicious skin lesions, transient blindness, difficulty walking, depression, unusual weight change, abnormal bleeding, enlarged lymph nodes, angioedema, breast masses, and testicular masses.    Physical Exam  General:  Well-developed,well-nourished,in no acute distress; alert,appropriate and cooperative throughout examination Neck:  No deformities, masses, or tenderness noted. Lungs:  Normal respiratory effort, chest expands symmetrically. Lungs are clear to auscultation, no crackles or wheezes. Heart:  Normal rate and regular rhythm. S1 and S2 normal without gallop, murmur, click, rub or other extra sounds. Abdomen:  Bowel sounds positive,abdomen soft and non-tender without masses, organomegaly or hernias noted.   Impression & Recommendations:  Problem # 1:  ATRIAL FIBRILLATION (ICD-427.31)  His updated medication list for this problem includes:    Amiodarone Hcl 200 Mg Tabs (Amiodarone hcl) .Marland Kitchen... 1 tablet by mouth once a day    Metoprolol Succinate 50 Mg Xr24h-tab (Metoprolol succinate) .Marland Kitchen... Take one tablet by mouth every evening- hold for systolic blood pressure less than 100.    Coumadin 2 Mg Tabs (Warfarin sodium) .Marland Kitchen... As directed    Digoxin 0.125 Mg Tabs (Digoxin) .Marland Kitchen... 1 every other day  Problem # 2:  RENAL INSUFFICIENCY (ICD-588.4)  Problem # 3:  CONGESTIVE HEART FAILURE (ICD-428.00)  His updated medication list for this problem includes:    Metoprolol Succinate 50 Mg Xr24h-tab (Metoprolol succinate) .Marland Kitchen... Take one tablet by mouth every evening- hold for systolic blood pressure less than 100.    Coumadin 2  Mg Tabs (Warfarin sodium) .Marland Kitchen... As directed    Digoxin 0.125 Mg Tabs (Digoxin) .Marland Kitchen... 1 every other day  Problem # 4:  DIABETES MELLITUS, TYPE II  (ICD-250.00)  His updated medication list for this problem includes:    Lantus 100 Unit/ml Soln (Insulin glargine) .Marland Kitchen... 20 units at bedtime  Complete Medication List: 1)  Flonase 50 Mcg/act Susp (Fluticasone propionate) .... 2 sprays each nostril q day 2)  Lantus 100 Unit/ml Soln (Insulin glargine) .... 20 units at bedtime 3)  Colcrys 0.6 Mg Tabs (Colchicine) .... Take as needed for gout 4)  Tylenol 325 Mg Tabs (Acetaminophen) .... As needed 5)  Amiodarone Hcl 200 Mg Tabs (Amiodarone hcl) .Marland Kitchen.. 1 tablet by mouth once a day 6)  Levothroid 25 Mcg Tabs (Levothyroxine sodium) .Marland Kitchen.. 1 by mouth daily 7)  Metoprolol Succinate 50 Mg Xr24h-tab (Metoprolol succinate) .... Take one tablet by mouth every evening- hold for systolic blood pressure less than 100. 8)  Vol-care Rx 1 Mg Tabs (B complex-c-folic acid) .... Take 1 tablet by mouth once a day 9)  Ultram 50 Mg Tabs (Tramadol hcl) .... As needed 10)  Coumadin 2 Mg Tabs (Warfarin sodium) .... As directed 11)  Allopurinol 100 Mg Tabs (Allopurinol) .Marland Kitchen.. 1 once daily 12)  Digoxin 0.125 Mg Tabs (Digoxin) .Marland Kitchen.. 1 every other day 13)  Simvastatin 20 Mg Tabs (Simvastatin) .... Once daily 14)  Alprazolam 0.5 Mg Tabs (Alprazolam) .Marland Kitchen.. 1 q6h as needed 15)  Oxycodone Hcl 5 Mg Tabs (Oxycodone hcl) .Marland Kitchen.. 1 q4h as needed 16)  Miralax Powd (Polyethylene glycol 3350) .... Once daily 17)  Potassium Chloride Cr 10 Meq Cr-caps (Potassium chloride) .... Take one tablet by mouth twice a day 18)  Onetouch Test Strp (Glucose blood) .... Use with glucose monitor three times a day  Other Orders: Flu Vaccine 26yrs + [id number]) Admin 1st Vaccine [id number])  Patient Instructions: 1)  He will get extensive labs done tomorrow, including an A1c, [facility], so we will await these. await the decision from the Transplant Committee.   Immunizations Administered:  Influenza Vaccine # 1:    Vaccine Type: Fluvax 3+    Site: right deltoid    Mfr: Gl[employer]oSmithK[employer]    Dose:  0.25 ml    Route: IM    Given by: Clearnce Sorrel CMA    Exp. Date: 04/20/2011    Lot #: [id number]    VIS given: 05/15/10 version given July 17, 2010.  Flu Vaccine Consent Questions:    Do you have a history of severe allergic reactions to this vaccine? no    Any prior history of allergic reactions to egg and/or gelatin? no    Do you have a sensitivity to the preservative Thimersol? no    Do you have a past history of Guillan-Barre Syndrome? no    Do you currently have an acute febrile illness? no    Have you ever had a severe reaction to latex? no    Vaccine information given and explained to patient? yes  Appended Document: follow up on diabetes/cjr    Clinical Lists Changes  Medications: Changed medication from ALLOPURINOL 100 MG TABS (ALLOPURINOL) 1 once daily to * ALLOPURINOL 300 MG TABS (ALLOPURINOL) 1 once daily Added new medication of * NEPRO  LIQD 8 OZ (NUTRITIONAL SUPPLEMENTS) Once daily Added new medication of DIPHENHYDRAMINE HCL 25 MG CAPS (DIPHENHYDRAMINE HCL) as needed for sleep Removed medication of POTASSIUM CHLORIDE CR 10 MEQ CR-CAPS (POTASSIUM CHLORIDE) Take one tablet by mouth  twice a day Added new medication of PROMETHAZINE HCL 25 MG/ML SOLN (PROMETHAZINE HCL) 1/2 to 1 as needed for nasea

## 2010-11-21 NOTE — Progress Notes (Signed)
Summary: talk to nurse  Phone Note From Other Clinic   Caller: Nurse Summary of Call: Per Magda Paganini with [facility]. Pt BP is up this morning 180/90. up since saturday. pt had nose bleeds. pt not on anything for BP. [phone] Initial call taken by: Lorraine Lax,  May 21, 2010 2:47 PM  Follow-up for Phone Call        SPOKE  TO LESLIE  PT HAD ELEVATED B/P SINCE SAT WITH REPORTING NOSE BLEED INR CHECKED THAT DAY WAS 2.1 CURRENTLY TAKING METOPROLOL 50 MG 1 once daily IN THE  EVEING PT TO CALL BACK WITH B/P READINGS  CURRENTLY AT DIALYSIS PER PT'S FRIEND. Follow-up by: Devra Dopp, LPN,  August  1, 624THL 3:12 PM  Additional Follow-up for Phone Call Additional follow up Details #1::        I called and spoke with the pt today to f/u on his BP readings. He states his bp ran high for 2 days. [facility] around 180/87 on monday. The home health nurse checked his BP later the same day and he was 144/70. He had HD on monday afternoon and his BP on arrival to HD was 111/60's. Over the last few days, he as been around 122/60's which is where he normally is. He had 2 nosebleeds on monday. He has used some saline nasal spray since then and has done ok. He is concerned that his urine output is down since his lasix was d/c'ed per renal. I have explained he should talk with dialysis when he goes today. He will call us back if his BP's start to run high again. Additional Follow-up by: Alvis Lemmings, RN, BSN,  May 23, 2010 10:30 AM

## 2010-11-21 NOTE — Progress Notes (Signed)
Summary: Wound from CABG Incision Donell Beers CALL REQUESTED  Phone Note From Other Clinic   Caller: Nurse  Magda Paganini, [place])  [phone] Summary of Call: Magda Paganini, RN with [facility]  would like a return call to discuss this pt..... Adv she can be reached at  [phone].  Initial call taken by: Duanne Moron,  May 21, 2010 2:59 PM  Follow-up for Phone Call        Lft msg to call office to discuss Pt. Follow-up by: Clearnce Sorrel CMA,  May 21, 2010 5:25 PM  Additional Follow-up for Phone Call Additional follow up Details #1::        [facility] nurse Magda Paganini, RN) called back to discuss pt, advised she was returning call from [place].... Davy Pique,  CMA currently with a pt and advised that Magda Paganini, RN w/ [facility] may want to call back tomorrow to discuss pt with Bethena Roys, RN (Dr Barbie Banner regular nurse) cause she may be more familiar with pt.... However, Barton Dubois that she called yesterday and called back again today - would like to address issue / concerns today....  Pt has wound on the inside of his R leg (wound from CABG incision), redness around site... would like order for wound care.Marland KitchenMarland KitchenMarland KitchenCan you advise order for same?  Magda Paganini, RN can be reached at [phone]  with any questions or concerns.  Additional Follow-up by: Duanne Moron,  May 22, 2010 9:18 AM    Additional Follow-up for Phone Call Additional follow up Details #2::    this order needs to come from his surgeon, not me  Follow-up by: Laurey Morale MD,  May 23, 2010 8:41 AM  Additional Follow-up for Phone Call Additional follow up Details #3:: Details for Additional Follow-up Action Taken: called. Additional Follow-up by: Chipper Oman, RN,  May 23, 2010 8:50 AM

## 2010-11-21 NOTE — Miscellaneous (Signed)
Summary: dx code correction  Clinical Lists Changes  Problems: Changed problem from RENAL INSUFFICIENCY (ICD-588.4) to RENAL INSUFFICIENCY (ICD-588.9)  changed the incorrect dx code to correct dx code [name] Brown  August 17, 2010 11:41 AM

## 2010-11-21 NOTE — Medication Information (Signed)
Summary: rov/sp  Anticoagulant Therapy  Managed by: [name], PharmD Referring MD: Percival Spanish PCP: Alysia Penna, MD Supervising MD: Angelena Form MD, Harrell Gave Indication 1: Atrial Fibrillation Lab Used: [facility] Site: [place] INR POC 2.8 INR RANGE 2.0-3.0  Dietary changes: no    Health status changes: no    Bleeding/hemorrhagic complications: no    Recent/future hospitalizations: yes       Details: Pending cardioversion  Any changes in medication regimen? no    Recent/future dental: no  Any missed doses?: no       Is patient compliant with meds? yes       Allergies: 1)  Penicillin V Potassium (Penicillin V Potassium)  Anticoagulation Management History:      The patient is taking warfarin and comes in today for a routine follow up visit.  Positive risk factors for bleeding include an age of 75 years or older and presence of serious comorbidities.  The bleeding index is 'intermediate risk'.  Positive CHADS2 values include History of CHF, History of HTN, and History of Diabetes.  Negative CHADS2 values include Age > 21 years old.  His last INR was 1.4 ratio.  Anticoagulation responsible [place] Every: Angelena Form MD, Harrell Gave.  INR POC: 2.8.  Cuvette Lot#: [id number].  Exp: 08/2011.    Anticoagulation Management Assessment/Plan:      The patient's current anticoagulation dose is Coumadin 2 mg tabs: as directed.  The target INR is 2.0-3.0.  The next INR is due 08/28/2010.  Anticoagulation instructions were given to patient.  Results were reviewed/authorized by [name], PharmD.         Prior Anticoagulation Instructions: INR 2.02  Continue 1 tablet every day excpet  1 1/2 tablets on Monday, Wednesday and Friday.  Recheck INR on 11/1.  New dose sent to HD.   Current Anticoagulation Instructions: INR 2.8 Take 1.5 tablets on Monday, Wednesday, and Friday. Take 1 tablet all other days. Recheck in 1 week.

## 2010-11-21 NOTE — Letter (Signed)
Summary: [facility] Office Visit   [facility] Office Visit   Imported By: Sallee Provencal 08/14/2010 14:29:56  _____________________________________________________________________  External Attachment:    Type:   Image     Comment:   External Document

## 2010-11-21 NOTE — Miscellaneous (Signed)
Summary: [facility] Physician Order/Treatment Plan   [facility] Physician Order/Treatment Plan   Imported By: Sallee Provencal 08/20/2010 10:45:36  _____________________________________________________________________  External Attachment:    Type:   Image     Comment:   External Document

## 2010-11-21 NOTE — Medication Information (Signed)
Summary: Coumadin Clinic  Anticoagulant Therapy  Managed by: Freddrick March, RN, BSN Referring MD: Percival Spanish PCP: Alysia Penna, MD Supervising MD: Johnsie Cancel MD, Collier Salina Indication 1: Atrial Fibrillation Lab Used: [facility] [place] Site: [place] PT 25.9 INR POC 2.58 INR RANGE 2.0-3.0  Dietary changes: yes       Details: dietary consistancy of vit K discussed with pt.  Health status changes: no    Bleeding/hemorrhagic complications: yes       Details: Reports of epitaxisis 2 episodes yesterday, 1st lasted a while, 2nd stopped after a few minutes.  Recent/future hospitalizations: no    Any changes in medication regimen? no    Recent/future dental: no  Any missed doses?: yes     Details: May have possibly missed 1 dose, unsure.   Is patient compliant with meds? yes      Comments: Pt goes to dialysis MWF. INR on 05/16/10 2.26  Allergies: 1)  Penicillin V Potassium (Penicillin V Potassium)  Anticoagulation Management History:      His anticoagulation is being managed by telephone today.  Positive risk factors for bleeding include an age of 75 years or older and presence of serious comorbidities.  The bleeding index is 'intermediate risk'.  Positive CHADS2 values include History of CHF, History of HTN, and History of Diabetes.  Negative CHADS2 values include Age > 65 years old.  His last INR was 1.4 ratio.  Prothrombin time is 25.9.  Anticoagulation responsible provider: Johnsie Cancel MD, Collier Salina.  INR POC: 2.58.    Anticoagulation Management Assessment/Plan:      The patient's current anticoagulation dose is Coumadin 2 mg tabs: as directed.  The target INR is 2.0-3.0.  The next INR is due 05/28/2010.  Anticoagulation instructions were given to patient.  Results were reviewed/authorized by Freddrick March, RN, BSN.  He was notified by Tula Nakayama, RN, BSN.         Current Anticoagulation Instructions: INR 2.58  Called spoke with pt's friend and pt and  advised to continue on  same dosage, Recheck in 1 week at dialysis.  Orders faxed.

## 2010-11-21 NOTE — Miscellaneous (Signed)
Clinical Lists Changes  Observations: Added new observation of CXR RESULTS:  Findings: Post CABG and aortic valve replacement.  Heart enlarged   with mild vascular congestion.  No significant pleural fluid.   Lungs clear.  Dialysis catheter unchanged.    IMPRESSION:    1.  Post CABG and aortic valve replacement.   2.  Mild vascular congestion.   3.  Lungs clear. (05/14/2010 14:25) Added new observation of DOPPLER LEG:  Routine. Procedure: A vascular evaluation was   performed. Image quality was adequate. Upper extremity vein mapping.    Vessel mapping. Location: Vascular laboratory. Patient status:   Inpatient.  Vein mapping: (04/19/2010 14:24) Added new observation of CARDCATHFIND: 1. Severe aortic stenosis with a peak aortic valve gradient of 40 mm,       a mean aortic valve gradient of 29 mm, and a calculated aortic       valve area of 0.7 sq. cm.   2. Three-vessel coronary disease with 40% proximal and 80% mid       stenosis in the left anterior descending, 90% stenosis in the mid-       to-distal circumflex artery, 90% stenosis in the mid and 90%       stenosis in the distal right coronary artery.   3. Ejection fraction of 40% to 50% by echocardiography.   4. Elevated pulmonary artery wedge pressure of 26 and pulmonary artery       pressure of 53/29 with a mean of 43.      RECOMMENDATIONS:  We will plan continued diuresis or removal of fluid   with dialysis as needed.  We will plan surgical consultation.  I will   discuss the findings with Dr. Percival Spanish.     (03/22/2010 14:22)      Cardiac Cath  Procedure date:  03/22/2010  Findings:      1. Severe aortic stenosis with a peak aortic valve gradient of 40 mm,       a mean aortic valve gradient of 29 mm, and a calculated aortic       valve area of 0.7 sq. cm.   2. Three-vessel coronary disease with 40% proximal and 80% mid       stenosis in the left anterior descending, 90% stenosis in the mid-       to-distal  circumflex artery, 90% stenosis in the mid and 90%       stenosis in the distal right coronary artery.   3. Ejection fraction of 40% to 50% by echocardiography.   4. Elevated pulmonary artery wedge pressure of 26 and pulmonary artery       pressure of 53/29 with a mean of 43.      RECOMMENDATIONS:  We will plan continued diuresis or removal of fluid   with dialysis as needed.  We will plan surgical consultation.  I will   discuss the findings with Dr. Percival Spanish.      Venous Doppler  Procedure date:  04/19/2010  Findings:       Routine. Procedure: A vascular evaluation was   performed. Image quality was adequate. Upper extremity vein mapping.    Vessel mapping. Location: Vascular laboratory. Patient status:   Inpatient.  Vein mapping:  CXR  Procedure date:  05/14/2010  Findings:       Findings: Post CABG and aortic valve replacement.  Heart enlarged   with mild vascular congestion.  No significant pleural fluid.   Lungs clear.  Dialysis catheter unchanged.  IMPRESSION:    1.  Post CABG and aortic valve replacement.   2.  Mild vascular congestion.   3.  Lungs clear.

## 2010-11-21 NOTE — Progress Notes (Signed)
Summary: thrush med  Phone Note Call from Patient Call back at [facility] Phone [phone]   Call For: fry Summary of Call: Has thrush in his mouth.  Perhaps had something hospital that caused it.  Can you prescribe for thrush?  [id number]  Allergic Pcn. Initial call taken by: Shelbie Hutching, RN,  January 05, 2010 12:17 PM  Follow-up for Phone Call        call in [place], swish 5 ml q 4 hours as needed , 300 ml with 2 rf Follow-up by: Laurey Morale MD,  January 05, 2010 12:30 PM  Additional Follow-up for Phone Call Additional follow up Details #1::        Phone Call Completed Additional Follow-up by: Shelbie Hutching, RN,  January 05, 2010 2:00 PM    New/Updated Medications: * MAGIC MOUTHWASH Swish 17ml every 4 hours prn Prescriptions: MAGIC MOUTHWASH Swish 29ml every 4 hours prn  #347ml x 2   Entered by:   Shelbie Hutching, RN   Authorized by:   Laurey Morale MD   Signed by:   Shelbie Hutching, RN on 01/05/2010   Method used:   Telephoned to ...       [facility] [address] (retail)       [address]       [place], [place]  [address]       Ph: [phone] or [phone]       Fax: [phone]   RxID:   [id number]

## 2010-11-21 NOTE — Assessment & Plan Note (Signed)
Summary: sob, cough, wheezing/cdw   Vital Signs:  Patient profile:   75 year old male Weight:      269 pounds O2 Sat:      95 % on Room air Temp:     97.8 degrees F oral Pulse rate:   70 / minute BP sitting:   134 / 92  (left arm) Cuff size:   large  Vitals Entered By: Townsend Roger, Hazen (November 02, 2009 9:44 AM)  O2 Flow:  Room air CC: sob onset last night, cough   History of Present Illness: He is here with a friend for SOB and coughing. He has had sinus pressure and PND for several weeks, but last night he developed a hard dry cough which can go on for several hours at a time. He gets SOB at times, but does not wheeze. There is no chest pain or sweats or nausea. He got some relief with some hydrocodone cough syrup he had at home. He had a check up with Dr. Florene Glen at the Nephrology office last week, and he seemed pleased with his status.    Allergies: 1)  Amoxicillin (Amoxicillin) 2)  Penicillin V Potassium (Penicillin V Potassium)  Past History:  Past Medical History: Coronary artery disease (non obstructive) Hyperlipidemia Hypertension Diabetes mellitus, type 2 LE edema Mild carotid artery disease, Dopplers in 2007 Gout Obesity Aortic Stenosis Renal insufficiency, sees Dr. Florene Glen  Past Surgical History: Reviewed history from 06/06/2009 and no changes required. Bone spurs removed from right heel Colonoscopy 05-09-06 per Dr. Ardis Hughs, repeat 3 years Appendectomy  Review of Systems  The patient denies anorexia, fever, weight loss, weight gain, vision loss, decreased hearing, hoarseness, chest pain, syncope, peripheral edema, headaches, hemoptysis, abdominal pain, melena, hematochezia, severe indigestion/heartburn, hematuria, incontinence, genital sores, muscle weakness, suspicious skin lesions, transient blindness, difficulty walking, depression, unusual weight change, abnormal bleeding, enlarged lymph nodes, angioedema, breast masses, and testicular masses.     Physical Exam  General:  he is a little out of breath after walking down the hall Head:  Normocephalic and atraumatic without obvious abnormalities. No apparent alopecia or balding. Eyes:  No corneal or conjunctival inflammation noted. EOMI. Perrla. Funduscopic exam benign, without hemorrhages, exudates or papilledema. Vision grossly normal. Ears:  External ear exam shows no significant lesions or deformities.  Otoscopic examination reveals clear canals, tympanic membranes are intact bilaterally without bulging, retraction, inflammation or discharge. Hearing is grossly normal bilaterally. Nose:  External nasal examination shows no deformity or inflammation. Nasal mucosa are pink and moist without lesions or exudates. Mouth:  Oral mucosa and oropharynx without lesions or exudates.  Teeth in good repair. Neck:  No deformities, masses, or tenderness noted. Lungs:  Normal respiratory effort, chest expands symmetrically. Lungs are clear to auscultation, no crackles or wheezes. Heart:  Normal rate and regular rhythm. S1 and S2 normal without gallop, murmur, click, rub or other extra sounds.   Impression & Recommendations:  Problem # 1:  ACUTE BRONCHITIS (ICD-466.0)  His updated medication list for this problem includes:    Levaquin 500 Mg Tabs (Levofloxacin) ..... Once daily  Orders: T-2 View CXR ([id number])  Complete Medication List: 1)  Furosemide 40 Mg Tabs (Furosemide) .... 2 in am and 1 in pm 2)  Glyburide-metformin 5-500 Mg Tabs (Glyburide-metformin) .... Two  tabs  two times a day 3)  Vicodin Hp 10-660 Mg Tabs (Hydrocodone-acetaminophen) .... 4 times a day as needed pain 4)  Ambien 10 Mg Tabs (Zolpidem tartrate) .... One at bedtime 5)  Lipitor 20 Mg Tabs (Atorvastatin calcium) .... Take 1 tab by mouth daily 6)  Ultravate 0.05 % Oint (Halobetasol propionate) .... Apply two times a day as needed rash 7)  Allopurinol 300 Mg Tabs (Allopurinol) .... Once daily 8)  Colchicine 0.6 Mg Tabs  (Colchicine) .Marland Kitchen.. 1 by mouth three times a day as needed for gout 9)  Bystolic 20 Mg Tabs (Nebivolol hcl) .Marland Kitchen.. 1 by mouth daily 10)  Tricor 145 Mg Tabs (Fenofibrate) .Marland Kitchen.. 1 by mouth once daily 11)  Aspirin 81 Mg Tabs (Aspirin) .... Daily 12)  Multivitamins Tabs (Multiple vitamin) .... Daily 13)  Vicodin 5-500 Mg Tabs (Hydrocodone-acetaminophen) .... One by mouth 6 hours as needed 14)  Folic Acid 1 Mg Tabs (Folic acid) .... Once daily 15)  Flonase 50 Mcg/act Susp (Fluticasone propionate) .... 2 sprays each nostril q day 16)  Levaquin 500 Mg Tabs (Levofloxacin) .... Once daily 17)  Prednisone (pak) 10 Mg Tabs (Prednisone) .... As directed for 12 days  Patient Instructions: 1)  Will start on Levaquin and a prednisone taper. Get a CXR today.  Prescriptions: PREDNISONE (PAK) 10 MG TABS (PREDNISONE) as directed for 12 days  #1 x 0   Entered and Authorized by:   Laurey Morale MD   Signed by:   Laurey Morale MD on 11/02/2009   Method used:   Electronically to        [facility] (retail)       [address]       [place], [place]  [address]       Ph: [phone] or [phone]       Fax: [phone]   RxID:   [id number] [place] 500 MG TABS (LEVOFLOXACIN) once daily  #10 x 0   Entered and Authorized by:   Laurey Morale MD   Signed by:   Laurey Morale MD on 11/02/2009   Method used:   Electronically to        [facility] [phone]* (retail)       [address]       [place], [place]  [address]       Ph: [phone] or [phone]       Fax: [phone]   RxID:   [id number]

## 2010-11-21 NOTE — Progress Notes (Signed)
Summary: [name] Brown needs orders for continued care and nuerological checks  Phone Note From Other Clinic Call back at [facility]: Jacklynn Lewis Summary of Call: [name] Brown called and needs to get orders for continuation of care from post hospitalization. Continue previous order and to do Nuerological checks every visit. Pls call Magda Paganini. Initial call taken by: Braulio Bosch,  June 19, 2010 4:14 PM  Follow-up for Phone Call        please set this up Follow-up by: Laurey Morale MD,  June 20, 2010 8:38 AM  Additional Follow-up for Phone Call Additional follow up Details #1::        Phone call completed Additional Follow-up by: Chipper Oman, RN,  June 20, 2010 8:43 AM

## 2010-11-21 NOTE — Progress Notes (Signed)
  Phone Note Outgoing Call   Call placed by: Margaretha Sheffield,  September 07, 2010 10:45 AM Call placed to: Patient Summary of Call: Patient is to be bridged with lovenox prior to HD catheter removal.  See previous phone note for more information.  INR will need to be 1.7 or below for patient to undergo catheter removal prior to TEE/cardioversion.  Patient's INR was 3.1 at most recent visit on 09/04/10.  I have given patient instructions to begin holding coumadin on Saturday, Nov 26.  He will hold coumadin on Saturday, Nov 26 and Sunday, Nov 27th.  He will come in for an appt on Monday, Nov 28th at which time we will begin Lovenox in preparation for his catheter removal on Thursday, Dec 1st.  He is a HD patient so will need 1 mg/kg every 24 hour dosing.  We will also need to contact Pam, RN for Dr. Percival Spanish to notify her of the pateint's INR and to communicate a plan for moving forward with the catheter removal and TEE/cardioversion.    Initial call taken by: Margaretha Sheffield,  September 07, 2010 11:24 AM

## 2010-11-21 NOTE — Medication Information (Signed)
Summary: rov/ewj  Anticoagulant Therapy  Managed by: Tula Nakayama, RN, BSN Referring MD: Percival Spanish PCP: Alysia Penna, MD Supervising MD: Verl Blalock MD, Marcello Moores Indication 1: Atrial Fibrillation Lab Used: [facility] Site: [facility] INR POC 1.9 INR RANGE 2.0-3.0  Dietary changes: no    Health status changes: no    Bleeding/hemorrhagic complications: no    Recent/future hospitalizations: no    Any changes in medication regimen? yes       Details: on new ABX for Sinus infection started last Wednesday for 10 days.   Recent/future dental: no  Any missed doses?: no       Is patient compliant with meds? yes       Allergies: 1)  Penicillin V Potassium (Penicillin V Potassium)  Anticoagulation Management History:      The patient is taking warfarin and comes in today for a routine follow up visit.  Positive risk factors for bleeding include an age of 9 years or older and presence of serious comorbidities.  The bleeding index is 'intermediate risk'.  Positive CHADS2 values include History of CHF, History of HTN, and History of Diabetes.  Negative CHADS2 values include Age > 15 years old.  His last INR was 1.4 ratio.  Anticoagulation responsible Derell Bruun: Verl Blalock MD, Marcello Moores.  INR POC: 1.9.  Cuvette Lot#: [id number].  Exp: 08/2011.    Anticoagulation Management Assessment/Plan:      The patient's current anticoagulation dose is Coumadin 2 mg tabs: as directed.  The target INR is 2.0-3.0.  The next INR is due 07/24/2010.  Anticoagulation instructions were given to patient.  Results were reviewed/authorized by Tula Nakayama, RN, BSN.  He was notified by Tula Nakayama, RN, BSN.         Prior Anticoagulation Instructions: INR 3.1  Take 1/2  tablet this Friday and Saturday, then resume same dosage  1 tablet daily except 1/2 tablet on Mondays and Fridays.  Recheck in 10 days.    Current Anticoagulation Instructions: INR 1.9 Today take extra 1/2  tablet then resume 1 tablet  everyday except 1/2 tablet on Mondays and Fridays. Recheck in 2 weeks.

## 2010-11-21 NOTE — Miscellaneous (Signed)
Summary: [facility] Order/Treatment Plan   [facility] Order/Treatment Plan   Imported By: Sallee Provencal 08/14/2010 09:06:13  _____________________________________________________________________  External Attachment:    Type:   Image     Comment:   External Document

## 2010-11-21 NOTE — Miscellaneous (Signed)
Summary: [facility] Progress Report    Cardiac Progress Report   Imported By: Sallee Provencal 09/03/2010 14:50:10  _____________________________________________________________________  External Attachment:    Type:   Image     Comment:   External Document

## 2010-11-21 NOTE — Progress Notes (Signed)
  Patient never came to lab but he said he has a appt for next week.

## 2010-11-21 NOTE — Assessment & Plan Note (Signed)
Summary: ? pneumonia//ccm   Vital Signs:  Patient profile:   75 year old male Height:      72.5 inches Weight:      199 pounds BMI:     26.71 O2 Sat:      97 % on Room air Temp:     98.4 degrees F oral Pulse rate:   86 / minute BP sitting:   120 / 70  (left arm) Cuff size:   regular  Vitals Entered By: Charlynne Cousins CMA (September 24, 2010 9:30 AM)  O2 Flow:  Room air CC: pt here with c/o chest congestion with productive cough ( producing dark yellow mucous) pt states symptoms started 09/21/2010   History of Present Illness: Here for 3 days of chest congestion and coughin up yellow sputum. No fever and no chest pain. He is getting dialysis 3 days a week. He has a recurrent right pleural effusion that so far has been controlled by the dialysis. Dr. Percival Spanish is working on getting him cardioverted sometime soon.   Current Medications (verified): 1)  Flonase 50 Mcg/act Susp (Fluticasone Propionate) .... 2 Sprays Each Nostril Q Day 2)  Lantus 100 Unit/ml Soln (Insulin Glargine) .... 20 Units At Bedtime 3)  Colcrys 0.6 Mg Tabs (Colchicine) .... Take As Needed For Gout 4)  Amiodarone Hcl 200 Mg Tabs (Amiodarone Hcl) .Marland Kitchen.. 1 Tablet By Mouth Once A Day 5)  Levothroid 25 Mcg Tabs (Levothyroxine Sodium) .Marland Kitchen.. 1 By Mouth Daily 6)  Metoprolol Succinate 50 Mg Xr24h-Tab (Metoprolol Succinate) .... Take One Tablet By Mouth Every Evening- Hold For Systolic Blood Pressure Less Than 100. 7)  Vol-Care Rx 1 Mg Tabs (B Complex-C-Folic Acid) .... Take 1 Tablet By Mouth Once A Day 8)  Ultram 50 Mg Tabs (Tramadol Hcl) .... As Needed 9)  Coumadin 2 Mg Tabs (Warfarin Sodium) .... As Directed 10)  Allopurinol 300 Mg Tabs (Allopurinol) .Marland Kitchen.. 1 Once Daily 11)  Digoxin 0.125 Mg Tabs (Digoxin) .Marland Kitchen.. 1 Every Other Day 12)  Simvastatin 20 Mg Tabs (Simvastatin) .... Once Daily 13)  Alprazolam 0.5 Mg Tabs (Alprazolam) .Marland Kitchen.. 1 Q6h As Needed 14)  Oxycodone Hcl 5 Mg Tabs (Oxycodone Hcl) .Marland Kitchen.. 1 Q4h As Needed 15)  Miralax   Powd (Polyethylene Glycol 3350) .... Once Daily 16)  Onetouch Test  Strp (Glucose Blood) .... Use With Glucose Monitor Three Times A Day 17)  Nepro  Liqd 8 Oz (Nutritional Supplements) .... Once Daily 18)  Fish Oil 1000 Mg Caps (Omega-3 Fatty Acids) .... Take 1 Cap By Mouth Daily.  Allergies (verified): 1)  Penicillin V Potassium (Penicillin V Potassium)  Past History:  Past Medical History: Reviewed history from 07/26/2010 and no changes required. Coronary artery disease, sees Dr. Percival Spanish Non- ST elevation MI   11-2009(secondary to supply demand mismatch) hx of SEVERE AORTIC STENOSIS Hyperlipidemia Hypertension Diabetes mellitus, type 2 LE edema Mild carotid artery disease, Dopplers in 2007 Gout Obesity Chronic renal failure, on dialysis, sees Dr. Florene Glen. He has also seen [facility] Dimas [facility] at [facility] Idiopathic hepatitis 11-2009 Atrial fibrillation Subcortical/cortical nonhemorrhagic CVA August 2011  Past Surgical History: Reviewed history from 05/24/2010 and no changes required. Bone spurs removed from right heel Colonoscopy 05-09-06 per Dr. Ardis Hughs, repeat 3 years ADENOMATOUS Appendectomy Coronary artery bypass grafts times 3 with tissue aortic valve replacement 04-04-10 per Dr. Roxan Hockey  Review of Systems  The patient denies anorexia, fever, weight loss, weight gain, vision loss, decreased hearing, hoarseness, chest pain, syncope, dyspnea on exertion, peripheral edema,  headaches, hemoptysis, abdominal pain, melena, hematochezia, severe indigestion/heartburn, hematuria, incontinence, genital sores, muscle weakness, suspicious skin lesions, transient blindness, difficulty walking, depression, unusual weight change, abnormal bleeding, enlarged lymph nodes, angioedema, breast masses, and testicular masses.    Physical Exam  General:  Well-developed,well-nourished,in no acute distress; alert,appropriate and cooperative throughout examination Head:  Normocephalic and  atraumatic without obvious abnormalities. No apparent alopecia or balding. Eyes:  No corneal or conjunctival inflammation noted. EOMI. Perrla. Funduscopic exam benign, without hemorrhages, exudates or papilledema. Vision grossly normal. Ears:  External ear exam shows no significant lesions or deformities.  Otoscopic examination reveals clear canals, tympanic membranes are intact bilaterally without bulging, retraction, inflammation or discharge. Hearing is grossly normal bilaterally. Nose:  External nasal examination shows no deformity or inflammation. Nasal mucosa are pink and moist without lesions or exudates. Mouth:  Oral mucosa and oropharynx without lesions or exudates.  Teeth in good repair. Neck:  No deformities, masses, or tenderness noted. Lungs:  clear except he has decreased sounds at the right base  Heart:  normal rate, no gallop, no rub, no JVD, and irregular rhythm.  has a 3/6 SM as usual    Impression & Recommendations:  Problem # 1:  ACUTE BRONCHITIS (ICD-466.0)  His updated medication list for this problem includes:    Zithromax Z-pak 250 Mg Tabs (Azithromycin) .Marland Kitchen... As directed  Complete Medication List: 1)  Flonase 50 Mcg/act Susp (Fluticasone propionate) .... 2 sprays each nostril q day 2)  Lantus 100 Unit/ml Soln (Insulin glargine) .... 20 units at bedtime 3)  Colcrys 0.6 Mg Tabs (Colchicine) .... Take as needed for gout 4)  Amiodarone Hcl 200 Mg Tabs (Amiodarone hcl) .Marland Kitchen.. 1 tablet by mouth once a day 5)  Levothroid 25 Mcg Tabs (Levothyroxine sodium) .Marland Kitchen.. 1 by mouth daily 6)  Metoprolol Succinate 50 Mg Xr24h-tab (Metoprolol succinate) .... Take one tablet by mouth every evening- hold for systolic blood pressure less than 100. 7)  Vol-care Rx 1 Mg Tabs (B complex-c-folic acid) .... Take 1 tablet by mouth once a day 8)  Ultram 50 Mg Tabs (Tramadol hcl) .... As needed 9)  Coumadin 2 Mg Tabs (Warfarin sodium) .... As directed 10)  Allopurinol 300 Mg Tabs (allopurinol)   .Marland Kitchen.. 1 once daily 11)  Digoxin 0.125 Mg Tabs (Digoxin) .Marland Kitchen.. 1 every other day 12)  Simvastatin 20 Mg Tabs (Simvastatin) .... Once daily 13)  Alprazolam 0.5 Mg Tabs (Alprazolam) .Marland Kitchen.. 1 q6h as needed 14)  Oxycodone Hcl 5 Mg Tabs (Oxycodone hcl) .Marland Kitchen.. 1 q4h as needed 15)  Miralax Powd (Polyethylene glycol 3350) .... Once daily 16)  Onetouch Test Strp (Glucose blood) .... Use with glucose monitor three times a day 17)  Nepro Liqd 8 Oz (nutritional Supplements)  .... Once daily 18)  Fish Oil 1000 Mg Caps (Omega-3 fatty acids) .... Take 1 cap by mouth daily. 19)  Zithromax Z-pak 250 Mg Tabs (Azithromycin) .... As directed  Patient Instructions: 1)  Please schedule a follow-up appointment as needed .  Prescriptions: ZITHROMAX Z-PAK 250 MG TABS (AZITHROMYCIN) as directed  #1 x 0   Entered and Authorized by:   Laurey Morale MD   Signed by:   Laurey Morale MD on 09/24/2010   Method used:   Electronically to        [facility] (retail)       [address]       [place], [place]  [address]       Ph: [phone] or  [phone]       Fax: [phone]   RxID:   [id number]    Orders Added: 1)  Est. Patient Level IV [id number]

## 2010-11-21 NOTE — Letter (Signed)
Summary: [facility]  [facility]   Imported By: Laural Benes 08/14/2010 13:37:42  _____________________________________________________________________  External Attachment:    Type:   Image     Comment:   External Document

## 2010-11-21 NOTE — Letter (Signed)
Summary: Appt Reminder [facility]  [place], [place] [address]   Phone: [phone]  Fax: [phone]        February 07, 2010 MRN: [id number]    [facility] [place], [place]  [address]    Dear Mr. Frayre,   You have a return appointment with Dr. Ardis Hughs on 03/21/10 at 8:45am.  Please remember to bring a complete list of the medicines you are taking, your insurance card and your co-pay.  If you have to cancel or reschedule this appointment, please call before 5:00 pm the evening before to avoid a cancellation fee.  If you have any questions or concerns, please call [phone].    Sincerely,    Christian Mate CMA ([place])

## 2010-11-21 NOTE — Miscellaneous (Signed)
Summary: [facility]   [facility]   Imported By: Sallee Provencal 07/19/2010 15:04:50  _____________________________________________________________________  External Attachment:    Type:   Image     Comment:   External Document

## 2010-11-21 NOTE — Miscellaneous (Signed)
Summary: dx code correction  Clinical Lists Changes  Problems: Changed problem from [place] (ICD-428.00) to CONGESTIVE HEART FAILURE (ICD-428.0) changed the incorrect dx code to correct dx code Valeria Batman  August 17, 2010 11:21 AM

## 2010-11-21 NOTE — Assessment & Plan Note (Signed)
Summary: continued nausea, bloating recent ER visit/pl    Ardis Hughs pt   History of Present Illness Visit Type: new patient  Primary GI MD: Owens Loffler MD Primary Provider: Laurey Morale, MD  Requesting Provider: n/a Chief Complaint: Bloating, nausea, vomiting, loss of appetite, and very dizzy  History of Present Illness:   75 YO MALE KNOWN TO DR. Ardis Hughs BUT NOT SEEN IN THE OFFICE OVER THE PAST 5 YEARS. HE HAD UNDERGONE PRIOR EVALUATION FOR A CYSTIC LESION OF THE PANCREAS. HE HAS HX OF CHF,SEVERE AORTIC STENOSIS,CKD STAGE 2-3,GOUT AND IDDM. HE HAD A RECENT HOSPITALIZATION AT [facility] 2/23-12/22/09 WITH AN ACUTE MI,ARF, ANS AN ACUTE HEPATITS WITH MARKEDLY ELEVATED TRANSAMINASES. ETIOLOGY WAS NOT CLEAR,ENZYMES RAPIDLY IMPROVED-CONSIDERED STATINS AS CULPRIT. HE HAD AN ER VISIT 2 DAYS AGO WITH CONFUSION, NAUSEA. CT OF THE HEAD WAS NEGATIVE FOR ACUTE FINDINGS-HAS ADVANCED SMALL VESSEL CHANGES AND ATROPHY.,LABS  SHOWED A NORMAL CBC,LIVER PANEL WITH TBILI1.8,SGOT51.BUN21,CREAT2.1. VENOUS AMMONIA 26. HE RECIEVED SOME FLUIDS AND PHENERGAN. HE SAYS THE PHENERGAN HELPED WITH THWE NAUSEA,AND HE HAS NOW RUN OUT. HE HAS FELT WORSE THE LAST 24 HOURS WITH ONGOING NAUSEA, NO APPETITE, NO VOMITING,NO FEVER, NO ABDOMINAL PAIN,BM'S NORMAL. HE HAS FELT DIZZY TODAY ,WORSE WHEN STANDING. NO CP,NO SOB.HAS NOT TAKEN HIS MEDS TODAY BECAUSE HE FELT BAD.   GI Review of Systems    Reports bloating, loss of appetite, and  nausea.      Denies abdominal pain, acid reflux, belching, chest pain, dysphagia with liquids, dysphagia with solids, heartburn, vomiting, vomiting blood, and  weight loss.      Reports liver problems.     Denies anal fissure, black tarry stools, change in bowel habit, constipation, diarrhea, diverticulosis, fecal incontinence, heme positive stool, hemorrhoids, irritable bowel syndrome, jaundice, light color stool, rectal bleeding, and  rectal pain.    Current Medications (verified): 1)  Furosemide 40 Mg Tabs  (Furosemide) .... 2 in Am and 1 in Pm 2)  Ambien 10 Mg  Tabs (Zolpidem Tartrate) .... One At Bedtime 3)  Allopurinol 300 Mg  Tabs (Allopurinol) .... Once Daily 4)  Bystolic 20 Mg Tabs (Nebivolol Hcl) .Marland Kitchen.. 1 By Mouth Daily 5)  Aspirin 81 Mg  Tabs (Aspirin) .... Daily 6)  Folic Acid 1 Mg Tabs (Folic Acid) .... Once Daily 7)  Flonase 50 Mcg/act Susp (Fluticasone Propionate) .... 2 Sprays Each Nostril Q Day 8)  Lantus 100 Unit/ml Soln (Insulin Glargine) .... 20 Units At Bedtime 9)  Promethazine Hcl 25 Mg Tabs (Promethazine Hcl) .Marland Kitchen.. 1 Q 4 Hours As Needed Nausea 10)  Proair Hfa 108 (90 Base) Mcg/act Aers (Albuterol Sulfate) .... 2 Puffs Q 4 Hours As Needed Sob 11)  Benzonatate 100 Mg Caps (Benzonatate) .Marland Kitchen.. 1 By Mouth Three Times A Day As Needed 12)  Transderm-Scop 1.5 Mg Pt72 (Scopolamine Base) .... Apply Every 3 Days 13)  Magic Mouthwash .... Swish 57ml Every 4 Hours Prn 14)  Colcrys 0.6 Mg Tabs (Colchicine) .... One Tablet By Mouth Three Times A Day For Five Days  Allergies (verified): 1)  Amoxicillin (Amoxicillin) 2)  Penicillin V Potassium (Penicillin V Potassium)  Past History:  Past Medical History: Coronary artery disease (non obstructive) non- ST elevation MI   11-2009 SEVERE AORTIC STENOSIS Hyperlipidemia Hypertension Diabetes mellitus, type 2 LE edema Mild carotid artery disease, Dopplers in 2007 Gout Obesity  Renal insufficiency, sees Dr. Florene Glen idiopathic hepatitis 11-2009  Past Surgical History: Bone spurs removed from right heel Colonoscopy 05-09-06 per Dr. Ardis Hughs, repeat 3 years ADENOMATOUS Appendectomy  Family History: Reviewed history from 06/06/2009 and no changes required. Family History of Colon CA 1st degree relative <60 Family History of CAD Male 1st degree relative <60 Family History Hypertension Family History of Liver Disease: Father   Social History: Reviewed history from 06/06/2009 and no changes required. Retired W[employer]OWED Never Smoked Alcohol  use-no  Review of Systems       The patient complains of allergy/sinus, arthritis/joint pain, back pain, change in vision, confusion, cough, depression-new, fatigue, headaches-new, hearing problems, heart murmur, nosebleeds, shortness of breath, sleeping problems, swelling of feet/legs, thirst - excessive, and voice change.  The patient denies anemia, anxiety-new, blood in urine, breast changes/lumps, coughing up blood, fainting, fever, heart rhythm changes, itching, muscle pains/cramps, night sweats, skin rash, sore throat, swollen lymph glands, urination - excessive, urination changes/pain, urine leakage, and vision changes.         ROS OTHERWISE AS IN HPI  Vital Signs:  Patient profile:   75 year old male Height:      72.5 inches Weight:      271 pounds BMI:     36.38 BSA:     2.44 Pulse rate:   72 / minute Pulse (ortho):   88 / minute Pulse rhythm:   regular BP supine:   124 / 92  (right arm) BP sitting:   132 / 96  (right arm) BP standing:   138 / 100  (right arm) Cuff size:   regular  Vitals Entered By: Hope Pigeon CMA (January 10, 2010 1:49 PM)   Physical Exam  General:  Well developed, well nourished, no acute distress.,CHRONICALLY ILL APPEARING Head:  Normocephalic and atraumatic. Eyes:  PERRLA, no icterus. Neck:  Supple; no masses or thyromegaly. Lungs:  Clear throughout to auscultation. Heart:  RRR,BLOWING SYST MURMUR Abdomen:  LARGE SOFT, NONTENDER, NO DEFINITE FLUID WAVE,SOME EDEMA OF SOFT TISSUES IN LOWER ABDOMEN, NO PALP HSM,BS+ Rectal:  NOT DONE Extremities:  2+ EDEMA ANKLES Neurologic:  Alert and  oriented x4;  grossly normal neurologically.weakness noted.  ,NO ASTERIXIS Psych:  Alert and cooperative. Normal mood and affect.   Impression & Recommendations:  Problem # 1:  NAUSEA (ICD-787.02) Assessment New 75 YO MALE WITH MULTIPLE MEDICAL PROBLEMS AND RECENT HOSPITALIZATION WITH NONST- MI,ARF, AND ACUTE HEPATITIS OF UNCLEAR ETIOLOGY,KNOWN SEVERE AORTIC  STENOSIS-  PRESENTING WITH 3 DAY HX OF NAUSEA,DIZZINESS ONSET TODAY,WEAKNESS. ER EVALUATION 3/21 WITH STABLE AND /OR IMPROVED LABS.  ETIOLOGY OF HIS NAUSEA IS NOT CLEAR,MAY BE CARDIOGENIC. HEPATITS HAS RESOLVED.  DISCUSSED HOSPITALIZATION WHICH HE IS HESITANT TO DO,HE IS NOT ORTHOSTATIC. WILL ALLOW HOME TO REST,ADVISED ER IF ANY WORSENING OF SXS. CLEAR TO FULL LIQUID DIET DECREASE LANTUS TO HALF USUAL DOSE NEXT COUPLE DAYS WHILE ON CLEARS PHENERGAN 12.-25 MG Q 6 HOURS AS NEEDED FOR NAUSEA REPEAT LABS TODAY PT AND FRIEND TO CALL TOMORROW WITH UPDATE IN CONDITION. Orders: TLB-CMP (Comprehensive Metabolic Pnl) (0000000) TLB-PT (Protime) (85610-PTP) TLB-Ammonia (82140-AMON)  Problem # 2:  RENAL INSUFFICIENCY (ICD-588.4) Assessment: Comment Only  Problem # 3:  CONGESTIVE HEART FAILURE (ICD-428.00) Assessment: Comment Only  Problem # 4:  AORTIC VALVE DISORDERS (ICD-424.1) Assessment: Comment Only  Problem # 5:  CONGESTIVE HEART FAILURE (ICD-428.0) Assessment: Comment Only  Problem # 6:  DIABETES MELLITUS, TYPE II (ICD-250.00) Assessment: Comment Only  Problem # 7:  PANCREATIC CYST Assessment: Unchanged MRI 3/11,STABLE UNILOCULAR CYSTS IN HEAD OF PANREAS-F/U MRI IN 6 MONTHS  Problem # 8:  CHOLELITHIASIS Assessment: Comment Only  Patient Instructions: 1)  Your physician has requested  that you have the following labwork done today: please go to our basement level. 2)  We sent a perscription for Phenergan to [facility]. 3)  Stay on a clear to full liquid diet. 4)  Liquid diet brochure.  5)  Copy sent to : Alysia Penna, Md 6)  The medication list was reviewed and reconciled.  All changed / newly prescribed medications were explained.  A complete medication list was provided to the patient / caregiver. Prescriptions: PROMETHAZINE HCL 25 MG TABS (PROMETHAZINE HCL) Take 1/2 to 1 tab every 6 hours as needed for nausea  #45 x 0   Entered by:   Marisue Humble NCMA   Authorized by:    Alfredia Ferguson PA-c   Signed by:   Marisue Humble NCMA on 01/10/2010   Method used:   Electronically to        [facility] [phone]* (retail)       [address]       [place], [place]  [address]       Ph: [phone] or [phone]       Fax: [phone]   RxID:   [id number]   Appended Document: continued nausea, bloating recent ER visit/pl    Ardis Hughs pt Dr. Percival Spanish called me today.  He is pretty certain that nausea is not cardiac related.  Pt will probably need aortic valve repair/replacement in the next several months.  patty, can you make sure that he has rov with me in next 3-4 weeks.  Appended Document: continued nausea, bloating recent ER visit/pl    Ardis Hughs pt pt appt scheduled

## 2010-11-21 NOTE — Medication Information (Signed)
Summary: Coumadin Clinic  Anticoagulant Therapy  Managed by: Freddrick March, RN, BSN Referring MD: Percival Spanish PCP: Alysia Penna, MD Supervising MD: Stanford Breed MD, Aaron Edelman Indication 1: Atrial Fibrillation Lab Used: [facility] Site: [place] INR RANGE 2.0-3.0    Bleeding/hemorrhagic complications: no     Any changes in medication regimen? yes       Details: Pt was on Amiodarone 200mg  bid, now decr to 200mg  daily.     Any missed doses?: no       Is patient compliant with meds? yes       Allergies: 1)  Penicillin V Potassium (Penicillin V Potassium)  Anticoagulation Management History:      Positive risk factors for bleeding include an age of 73 years or older and presence of serious comorbidities.  The bleeding index is 'intermediate risk'.  Positive CHADS2 values include History of CHF, History of HTN, and History of Diabetes.  Negative CHADS2 values include Age > 25 years old.  His last INR was 1.4 ratio.  Anticoagulation responsible provider: Stanford Breed MD, Aaron Edelman.    Anticoagulation Management Assessment/Plan:      The patient's current anticoagulation dose is Coumadin 2 mg tabs: as directed.  The target INR is 2.0-3.0.  The next INR is due 06/15/2010.  Anticoagulation instructions were given to patient.  Results were reviewed/authorized by Freddrick March, RN, BSN.  He was notified by Freddrick March RN.         Prior Anticoagulation Instructions: INR 3.7  LMOM for pt [name] PharmD  May 30, 2010 2:36 PM   Spoke with pt.  Skip tomorrow's dose of Coumadin then decrease dose to 1 tablet every day except 1/2 tablet on Monday and Friday.  Recheck INR in 1 week.  Orders sent to [facility].   Current Anticoagulation Instructions: INR 4.5  Called spoke with pt's caregiver, advised to hold x 2 doses, then decr dosage to 1 tablet daily except 1/2 tablet on Mondays, Wednesdays, and Fridays.  Recheck in 1 week.

## 2010-11-21 NOTE — Medication Information (Signed)
Summary: Coumadin Clinic  Anticoagulant Therapy  Managed by: [name], PharmD Referring MD: Percival Spanish PCP: Alysia Penna, MD Supervising MD: [name] MD, [name] Indication 1: Atrial Fibrillation Lab Used: [facility] [place] Site: [place] PT 21.5 INR POC 2.02 INR RANGE 2.0-3.0  Dietary changes: no    Health status changes: no    Bleeding/hemorrhagic complications: no    Recent/future hospitalizations: no    Any changes in medication regimen? no    Recent/future dental: no  Any missed doses?: no       Is patient compliant with meds? yes      Comments: Lab drawn at HD.  Received on 10/28.  Pt was seen 1 day earlier in clinic.  Coumadin dose increased.  Pending Cardioversion. Lab noted but no dosing changes made  Allergies: 1)  Penicillin V Potassium (Penicillin V Potassium)  Anticoagulation Management History:      His anticoagulation is being managed by telephone today.  Positive risk factors for bleeding include an age of 59 years or older and presence of serious comorbidities.  The bleeding index is 'intermediate risk'.  Positive CHADS2 values include History of CHF, History of HTN, and History of Diabetes.  Negative CHADS2 values include Age > 62 years old.  His last INR was 1.4 ratio.  Prothrombin time is 21.5.  Anticoagulation responsible provider: [name] MD, [name].  INR POC: 2.02.  Exp: 08/2011.    Anticoagulation Management Assessment/Plan:      The patient's current anticoagulation dose is Coumadin 2 mg tabs: as directed.  The target INR is 2.0-3.0.  The next INR is due 08/21/2010.  Anticoagulation instructions were given to patient.  Results were reviewed/authorized by [name], PharmD.  He was notified by [name] PharmD.         Prior Anticoagulation Instructions: INR 1.8  Take an extra 1/2 tablet today then increase dose to 1 tablets every day except 1 1/2 tablets on Monday, Wednesday and Friday.  Recheck INR in 1 week.   Current  Anticoagulation Instructions: INR 2.02  Continue 1 tablet every day excpet  1 1/2 tablets on Monday, Wednesday and Friday.  Recheck INR on 11/1.  New dose sent to HD.

## 2010-11-21 NOTE — Progress Notes (Signed)
Summary: referral  Phone Note Call from Patient   Caller: male Call For: Laurey Morale MD Summary of Call: wants referral to dietician for diabetes Initial call taken by: Chipper Oman, RN,  December 08, 2009 2:43 PM  Follow-up for Phone Call        Phone Call Completed Follow-up by: Townsend Roger, [place],  December 08, 2009 2:50 PM

## 2010-11-21 NOTE — Assessment & Plan Note (Signed)
Summary: FOLLOW UP/RETAINING FLUID/CJR   Vital Signs:  Patient profile:   75 year old male Weight:      281 pounds Temp:     97.7 degrees F oral Pulse rate:   74 / minute BP sitting:   124 / 80  (left arm) Cuff size:   large  Vitals Entered By: Townsend Roger, [place] (December 26, 2009 1:31 PM) CC: hosp f/u, bilateral leg edema   History of Present Illness: Here to follow up on a hospital stay from 12-13-09 to 12-22-09 for a series of problems which began with prolonged nausea and vomitting. On admission his liver enzymes were extremely elevated which was consistent with hepatitis. He was very dehydrated, and this led to some acute on chronic renal failure and a mild non-ST elevation MI by enzymes. Apparently his ECHO showed his aortic stenosis to be stable, but I did not see the actual results on this. I do not know what his LV EF is either. Once he was rehydrated with IV fluids and the vomitting stopped, his renal status corrected nicely. His DC creatinine was 1.78. No definitive etiology of the hepatitis was found. All viral etiologies were ruled out, and he was negative for gallstones or CBD obstructions. It was felt that the combination of Lipitor and Tricor could have contributed, so these were stopped. His liver enzymes dramatically improved during the hospitalization, and his DC AST was 83 and ALT was 439, with a total bilirubin of 2.3. He has a bad flare of gout in both feet, but this has stabilized. His glucoses were normal to low, so all his oral diabetic meds were stopped, and he is now maintained on lantus alone. Since he got home his glucoses are stable. A random glucose today was 160. Fasting am glucoses yesterday and today were 89 and 136. Notice was made of a pancreatic cyst, but the pt. says he has known about this for at least 5 years. it has been imaged several times in this span, and it never changes in appearance. His appetite is poor but is slowly coming back. Drinking plenty of fluids.  Now he is getting some mild edema around the lower legs and feet again. He has had a dry hacking cough for about 2 months, and this persists today. No fever.   Current Medications (verified): 1)  Furosemide 40 Mg Tabs (Furosemide) .... 2 in Am and 1 in Pm 2)  Ambien 10 Mg  Tabs (Zolpidem Tartrate) .... One At Bedtime 3)  Allopurinol 300 Mg  Tabs (Allopurinol) .... Once Daily 4)  Bystolic 20 Mg Tabs (Nebivolol Hcl) .Marland Kitchen.. 1 By Mouth Daily 5)  Aspirin 81 Mg  Tabs (Aspirin) .... Daily 6)  Vicodin 5-500 Mg Tabs (Hydrocodone-Acetaminophen) .... One By Mouth 6 Hours As Needed 7)  Folic Acid 1 Mg Tabs (Folic Acid) .... Once Daily 8)  Flonase 50 Mcg/act Susp (Fluticasone Propionate) .... 2 Sprays Each Nostril Q Day 9)  Lantus Solostar 100 Unit/ml Soln (Insulin Glargine) .... Use 30 Units At Bedtime 10)  Promethazine Hcl 25 Mg Tabs (Promethazine Hcl) .Marland Kitchen.. 1 Q 4 Hours As Needed Nausea 11)  Proair Hfa 108 (90 Base) Mcg/act Aers (Albuterol Sulfate) .... 2 Puffs Q 4 Hours As Needed Sob 12)  Benzonatate 100 Mg Caps (Benzonatate) .Marland Kitchen.. 1 By Mouth Three Times A Day As Needed 13)  Transderm-Scop 1.5 Mg Pt72 (Scopolamine Base) .... Apply Every 3 Days  Allergies (verified): 1)  Amoxicillin (Amoxicillin) 2)  Penicillin V Potassium (Penicillin  V Potassium)  Past History:  Past Medical History: Coronary artery disease (non obstructive) non- ST elevation MI   11-2009 Hyperlipidemia Hypertension Diabetes mellitus, type 2 LE edema Mild carotid artery disease, Dopplers in 2007 Gout Obesity Aortic Stenosis, sees Dr. Percival Spanish Renal insufficiency, sees Dr. Florene Glen idiopathic hepatitis 11-2009  Past Surgical History: Reviewed history from 06/06/2009 and no changes required. Bone spurs removed from right heel Colonoscopy 05-09-06 per Dr. Ardis Hughs, repeat 3 years Appendectomy  Review of Systems  The patient denies anorexia, fever, weight loss, vision loss, decreased hearing, hoarseness, chest pain, syncope,  headaches, hemoptysis, abdominal pain, melena, hematochezia, severe indigestion/heartburn, hematuria, incontinence, genital sores, muscle weakness, suspicious skin lesions, transient blindness, difficulty walking, depression, unusual weight change, abnormal bleeding, enlarged lymph nodes, angioedema, breast masses, and testicular masses.    Physical Exam  General:  overweight-appearing.  weak, gets SOB with walking short distances Neck:  No deformities, masses, or tenderness noted. Lungs:  bilateral rales at lung bases Heart:  Normal rate and regular rhythm. S1 and S2 normal without gallop, murmur, click, rub or other extra sounds. Abdomen:  Bowel sounds positive,abdomen soft and non-tender without masses, organomegaly or hernias noted. Extremities:  2+ left pedal edema and 2+ right pedal edema.     Impression & Recommendations:  Problem # 1:  UNSPECIFIED HEPATITIS (ICD-573.3)  Problem # 2:  RENAL INSUFFICIENCY (ICD-588.9)  Problem # 3:  CONGESTIVE HEART FAILURE (ICD-428.0)  His updated medication list for this problem includes:    Furosemide 40 Mg Tabs (Furosemide) .Marland Kitchen... 2 in am and 1 in pm    Bystolic 20 Mg Tabs (Nebivolol hcl) .Marland Kitchen... 1 by mouth daily    Aspirin 81 Mg Tabs (Aspirin) .Marland Kitchen... Daily  Orders: Venipuncture [id number]) TLB-BMP (Basic Metabolic Panel-BMET) (99991111) TLB-CBC Platelet - w/Differential (85025-CBCD) TLB-Hepatic/Liver Function Pnl (80076-HEPATIC) TLB-BNP (B-Natriuretic Peptide) (83880-BNPR)  Problem # 4:  DIABETES MELLITUS, TYPE II (ICD-250.00)  The following medications were removed from the medication list:    Glyburide-metformin 5-500 Mg Tabs (Glyburide-metformin) .Marland Kitchen..Marland Kitchen Two  tabs  two times a day    Januvia 100 Mg Tabs (Sitagliptin phosphate) ..... Once daily His updated medication list for this problem includes:    Aspirin 81 Mg Tabs (Aspirin) .Marland Kitchen... Daily    Lantus Solostar 100 Unit/ml Soln (Insulin glargine) ..... Use 30 units at bedtime  Complete  Medication List: 1)  Furosemide 40 Mg Tabs (Furosemide) .... 2 in am and 1 in pm 2)  Ambien 10 Mg Tabs (Zolpidem tartrate) .... One at bedtime 3)  Allopurinol 300 Mg Tabs (Allopurinol) .... Once daily 4)  Bystolic 20 Mg Tabs (Nebivolol hcl) .Marland Kitchen.. 1 by mouth daily 5)  Aspirin 81 Mg Tabs (Aspirin) .... Daily 6)  Vicodin 5-500 Mg Tabs (Hydrocodone-acetaminophen) .... One by mouth 6 hours as needed 7)  Folic Acid 1 Mg Tabs (Folic acid) .... Once daily 8)  Flonase 50 Mcg/act Susp (Fluticasone propionate) .... 2 sprays each nostril q day 9)  Lantus Solostar 100 Unit/ml Soln (Insulin glargine) .... Use 30 units at bedtime 10)  Promethazine Hcl 25 Mg Tabs (Promethazine hcl) .Marland Kitchen.. 1 q 4 hours as needed nausea 11)  Proair Hfa 108 (90 Base) Mcg/act Aers (Albuterol sulfate) .... 2 puffs q 4 hours as needed sob 12)  Benzonatate 100 Mg Caps (Benzonatate) .Marland Kitchen.. 1 by mouth three times a day as needed 13)  Transderm-scop 1.5 Mg Pt72 (Scopolamine base) .... Apply every 3 days 14)  Hydromet 5-1.5 Mg/11ml Syrp (Hydrocodone-homatropine) .Marland Kitchen.. 1 tsp q 4 hours as  needed cough  Patient Instructions: 1)  He appears to be getting over the hepatitis, but we will check liver enzymes today. We will stay off statins and fibrates for now, and will see what Dr. Percival Spanish suggests. I told him to start on fish oil supplements, and to ask Dr. Percival Spanish about using Niaspan. Will check his renal function today. Check a BNP. I suspect that his cough is from some mild CHF, so he will continue on his Lasix. Dr. Percival Spanish will evaluate this also. His DM appears to be well managed on Lantus alone, so we will keep him on this alone.  Prescriptions: HYDROMET 5-1.5 MG/5ML SYRP (HYDROCODONE-HOMATROPINE) 1 tsp q 4 hours as needed cough  #240 x 0   Entered and Authorized by:   Laurey Morale MD   Signed by:   Laurey Morale MD on 12/26/2009   Method used:   Print then Give to Patient   RxID:   [id number]

## 2010-11-21 NOTE — Progress Notes (Signed)
Summary: INR 1.79 on 07/21/10  Phone Note Call from Patient   Caller: Patient Call For: Coumadin Clinic Summary of Call: Pt called states he received blood work from Dialysis and his INR was 1.79.  Pt states his normal dosage of Coumadin is 1 tablet daily except 1/2 tablet on Mondays and Fridays.  Instructed pt to take 1.5 tablets today, then resume same dosage. Pt has an appt with [place] on Thursday 07/26/10 and we will consider incr dosage at [place].  Initial call taken by: Freddrick March RN,  July 24, 2010 9:00 AM

## 2010-11-21 NOTE — Progress Notes (Signed)
Summary: needs order  Phone Note From Other Clinic Call back at [phone]   Caller: Nurse, Magda Paganini Summary of Call: No good BM in 10 days, Miralax not helping much. Can they have an order for Dulcolax supp and enema if needed? Initial call taken by: Chipper Oman, RN,  June 12, 2010 9:35 AM  Follow-up for Phone Call        call in an order for Dulcolax once a day as needed and for Fleet enema once daily as needed constipation Follow-up by: Laurey Morale MD,  June 12, 2010 1:25 PM  Additional Follow-up for Phone Call Additional follow up Details #1::        Phone call completed Additional Follow-up by: Chipper Oman, RN,  June 12, 2010 1:59 PM

## 2010-11-21 NOTE — Letter (Signed)
Summary: [facility]   [facility]   Imported By: Sallee Provencal 05/30/2010 15:00:06  _____________________________________________________________________  External Attachment:    Type:   Image     Comment:   External Document

## 2010-11-21 NOTE — Progress Notes (Signed)
Summary: PT  Phone Note From Other Clinic Call back at [phone]   Caller: Tresa Garter therapist with [facility] Summary of Call: post CABG and dialysis. Still weak, not stable. Asking for extension of 2xweek for another 4 weeks? Initial call taken by: Chipper Oman, RN,  June 12, 2010 9:37 AM  Follow-up for Phone Call        yes, okay for 4 more weeks of PT Follow-up by: Laurey Morale MD,  June 12, 2010 1:27 PM  Additional Follow-up for Phone Call Additional follow up Details #1::        Phone call completed Additional Follow-up by: Chipper Oman, RN,  June 12, 2010 2:00 PM

## 2010-11-21 NOTE — Progress Notes (Signed)
Summary: pt forgot to take metoprolol last night  Phone Note Call from Patient Call back at Home Phone [phone]   Caller: Patient Reason for Call: Talk to Nurse Summary of Call: pt forgot to take METOPROLOL SUCCINATE 50 MG XR24 last night, should he take this am or wait until tonight.  Initial call taken by: Neil Crouch,  June 01, 2010 8:32 AM  Follow-up for Phone Call        spoke with pt who stats he just forgot to take his medication last night.  HR 87, and BP stable.  pt will go ahead and take his Metoprolol as ordered.  He will call if he develops any problems with his HR and/or BP. Follow-up by: Sim Boast, RN,  June 01, 2010 9:04 AM

## 2010-11-21 NOTE — Assessment & Plan Note (Signed)
Summary: eph   Visit Type:  Post-hospital Referring Ameerah Huffstetler:  n/a Primary Craigory Toste:  Alysia Penna, MD  CC:  weakness.  History of Present Illness: This is a 75 year old white male patient who underwent CABG x3 and aortic valve replacement April 04, 2010. Ejection fraction is 45%. Patient went into atrial fibrillation postop and was placed on amiodarone and Coumadin. He also developed renal failure and is getting dialysis 3 days a week.  Overall the patient has done well from a cardiac standpoint. He denies chest pain palpitations dyspnea and dizziness or presyncope. His main trouble is dialysis. He recently had a fistula placed but it has not been used yet.  Current Medications (verified): 1)  Flonase 50 Mcg/act Susp (Fluticasone Propionate) .... 2 Sprays Each Nostril Q Day 2)  Lantus 100 Unit/ml Soln (Insulin Glargine) .... 20 Units At Bedtime 3)  Colcrys 0.6 Mg Tabs (Colchicine) .... Take As Needed For Gout 4)  Tylenol 325 Mg Tabs (Acetaminophen) .... As Needed 5)  Amiodarone Hcl 200 Mg Tabs (Amiodarone Hcl) .Marland Kitchen.. 1 By Mouth Two Times A Day 6)  Levothroid 25 Mcg Tabs (Levothyroxine Sodium) .Marland Kitchen.. 1 By Mouth Daily 7)  Metoprolol Succinate 50 Mg Xr24h-Tab (Metoprolol Succinate) .... Take One Tablet By Mouth Every Evening- Hold For Systolic Blood Pressure Less Than 100. 8)  Vol-Care Rx 1 Mg Tabs (B Complex-C-Folic Acid) .... Take 1 Tablet By Mouth Once A Day 9)  Ultram 50 Mg Tabs (Tramadol Hcl) .... As Needed 10)  Coumadin 2 Mg Tabs (Warfarin Sodium) .... As Directed 11)  Allopurinol 100 Mg Tabs (Allopurinol) .Marland Kitchen.. 1 Once Daily 12)  Digoxin 0.125 Mg Tabs (Digoxin) .Marland Kitchen.. 1 Every Other Day 13)  Simvastatin 20 Mg Tabs (Simvastatin) .... Once Daily 14)  Alprazolam 0.5 Mg Tabs (Alprazolam) .Marland Kitchen.. 1 Q6h As Needed 15)  Oxycodone Hcl 5 Mg Tabs (Oxycodone Hcl) .Marland Kitchen.. 1 Q4h As Needed 16)  Miralax  Powd (Polyethylene Glycol 3350) .... Once Daily  Allergies: 1)  Penicillin V Potassium (Penicillin V  Potassium)  Past History:  Past Medical History: Last updated: 05/24/2010 Coronary artery disease, sees Dr. Percival Spanish Non- ST elevation MI   11-2009(secondary to supply demand mismatch) hx of SEVERE AORTIC STENOSIS Hyperlipidemia Hypertension Diabetes mellitus, type 2 LE edema Mild carotid artery disease, Dopplers in 2007 Gout Obesity chronic renal failure, on dialysis, sees Dr. Florene Glen Idiopathic hepatitis 11-2009 Atrial fibrillation  Review of Systems       see history of present illness  Vital Signs:  Patient profile:   75 year old male Height:      72.5 inches Weight:      210 pounds BMI:     28.19 Pulse rate:   71 / minute Pulse rhythm:   regular Resp:     18 per minute BP sitting:   124 / 64  (left arm) Cuff size:   large  Vitals Entered By: Sidney Ace (June 05, 2010 12:54 PM)  Physical Exam  General:   Well-nournished, in no acute distress. Neck: No JVD, HJR, Bruit, or thyroid enlargement Lungs: No tachypnea, clear without wheezing, rales, or rhonchi Cardiovascular:incisions healing well, irregular irregular, PMI not displaced, heart sounds normal, no murmurs, gallops, bruit, thrill, or heave. Abdomen: BS normal. Soft without organomegaly, masses, lesions or tenderness. Extremities: without cyanosis, clubbing or edema. Good distal pulses bilateral SKin: Warm, no lesions or rashes  Musculoskeletal: No deformities Neuro: no focal signs    EKG  Procedure date:  06/05/2010  Findings:  atrial fibrillation with nonspecific ST-T wave changes no acute change  Impression & Recommendations:  Problem # 1:  CORONARY ARTERY DISEASE (ICD-414.00) Patient status post CABG x3 and aortic valve replacement April 04, 2010. Ejection fraction is 45%. This was complicated by a Dr. fibrillation and renal failure. Creatinine was actually 3.3 on admission. He is now undergoing dialysis. Patient stable from a cardiac standpoint. The following medications were removed  from the medication list:    Aspirin 81 Mg Tabs (Aspirin) .Marland Kitchen... Daily His updated medication list for this problem includes:    Metoprolol Succinate 50 Mg Xr24h-tab (Metoprolol succinate) .Marland Kitchen... Take one tablet by mouth every evening- hold for systolic blood pressure less than 100.    Coumadin 2 Mg Tabs (Warfarin sodium) .Marland Kitchen... As directed  Problem # 2:  ATRIAL FIBRILLATION (ICD-427.31) I will decrease his amiodarone to 200 mg daily. The following medications were removed from the medication list:    Aspirin 81 Mg Tabs (Aspirin) .Marland Kitchen... Daily His updated medication list for this problem includes:    Amiodarone Hcl 200 Mg Tabs (Amiodarone hcl) .Marland Kitchen... 1 tablet by mouth once a day    Metoprolol Succinate 50 Mg Xr24h-tab (Metoprolol succinate) .Marland Kitchen... Take one tablet by mouth every evening- hold for systolic blood pressure less than 100.    Coumadin 2 Mg Tabs (Warfarin sodium) .Marland Kitchen... As directed    Digoxin 0.125 Mg Tabs (Digoxin) .Marland Kitchen... 1 every other day  Orders: EKG w/ Interpretation (93000)  Problem # 3:  RENAL INSUFFICIENCY (ICD-588.4) On hemodialysis 3 days a week. [facility] next month.  Problem # 4:  CONGESTIVE HEART FAILURE (ICD-428.00) Patient has history of systolic heart failure ejection fraction 45%. He is euvolemic today. The following medications were removed from the medication list:    Aspirin 81 Mg Tabs (Aspirin) .Marland Kitchen... Daily His updated medication list for this problem includes:    Amiodarone Hcl 200 Mg Tabs (Amiodarone hcl) .Marland Kitchen... 1 tablet by mouth once a day    Metoprolol Succinate 50 Mg Xr24h-tab (Metoprolol succinate) .Marland Kitchen... Take one tablet by mouth every evening- hold for systolic blood pressure less than 100.    Coumadin 2 Mg Tabs (Warfarin sodium) .Marland Kitchen... As directed    Digoxin 0.125 Mg Tabs (Digoxin) .Marland Kitchen... 1 every other day  Patient Instructions: 1)  Your physician recommends that you schedule a follow-up appointment in: 1 to 2 months  with Dr. Percival Spanish 2)  Your physician has recommended you make the following change in your medication: Amiodarone 200 mg take one tablet once a day.

## 2010-11-21 NOTE — Letter (Signed)
Summary: [facility]   Imported By: Laural Benes 11/08/2009 13:47:53  _____________________________________________________________________  External Attachment:    Type:   Image     Comment:   External Document

## 2010-11-21 NOTE — Progress Notes (Signed)
Summary: Abn CXR - pt wants to know if he should be listened too anyway?  Phone Note Outgoing Call   Call placed by: Sim Boast, RN,  August 22, 2010 2:53 PM Call placed to: Patient Details for Reason: follow up cxr Summary of Call: plural effusion on CXR from 08/21/10.  Dr Percival Spanish spoke with Dr Blase Mess who wants to see him in the office.  lm for pt to call back to discuss. Initial call taken by: Sim Boast, RN,  August 22, 2010 2:54 PM  Follow-up for Phone Call        pt returned [place]  August 22, 2010 4:41 PM  pt aware of results but states he had dialysis today.  states they heard the fluid in his lungs before dialysis and pulled more off of him and he is breathing much better now.  Pt aware I will discuss with Dr Percival Spanish and call him back Follow-up by: Sim Boast, RN,  August 22, 2010 5:26 PM  Additional Follow-up for Phone Call Additional follow up Details #1::        Dr Percival Spanish aware that pt states he feels much better. Pt was instructed to call back if SOB returns. Pt does wonder if he should still see the MD to be listened to and also discuss where the fluid maybe coming from.  Will ask MD about pt's concerns and call him back. Additional Follow-up by: Sim Boast, RN,  August 22, 2010 5:51 PM    Additional Follow-up for Phone Call Additional follow up Details #2::    Yes, still see Dr. Roxan Hockey Follow-up by: Minus Breeding, MD, [facility],  August 22, 2010 6:02 PM  Additional Follow-up for Phone Call Additional follow up Details #3:: Details for Additional Follow-up Action Taken: called to schedule pt to see Dr Roxan Hockey - who is only in the office on Mondays.  Pt had dialysis on MWF.  Dr Hendrickson's office to call the pt to schedule to be seen Additional Follow-up by: Sim Boast, RN,  August 23, 2010 9:17 AM

## 2010-11-21 NOTE — Progress Notes (Signed)
Summary: Schedule Colonoscopy  Phone Note Outgoing Call Call back at [facility] Phone [phone]   Call placed by: Bernita Buffy CMA Deborra Medina),  November 24, 2009 11:43 AM Call placed to: Patient Summary of Call: Left message on patients machine to call back.  Initial call taken by: Bernita Buffy CMA Deborra Medina),  November 24, 2009 11:44 AM  Follow-up for Phone Call        patient has a new dx of insulin dep diab. and states that Dr. Sarajane Jews would like him to wait until his levels are under control to schedule his colonoscopy, he has our number and will call back when he gets clearance from Dr. Sarajane Jews.  Follow-up by: Bernita Buffy CMA Deborra Medina),  November 30, 2009 3:51 PM

## 2010-11-21 NOTE — Progress Notes (Signed)
Summary: LMTCB 8-25,26  Phone Note Call from Patient Call back at Home Phone [phone]   Caller: Patient Call For: Laurey Morale MD Summary of Call: Pt left message for someone to call him back.......thinks he had a TIA.  Called back and LMTCB. Initial call taken by: Deanna Artis CMA,  June 14, 2010 2:00 PM  Follow-up for Phone Call        Left message to call back for workin appointment still available at this time, if he didn't go to ER, or with any message for Dr. Wanda Plump.  Shelbie Hutching, RN  June 15, 2010 8:21 AM   Additional Follow-up for Phone Call Additional follow up Details #1::        please try to get in touch with him Additional Follow-up by: Laurey Morale MD,  June 15, 2010 8:32 AM    Additional Follow-up for Phone Call Additional follow up Details #2::    Pt has been hospitalized and discharged. Follow-up by: Deanna Artis CMA,  June 18, 2010 3:41 PM

## 2010-11-21 NOTE — Progress Notes (Signed)
Summary: question about potassium  Phone Note Other Incoming   Caller: pt Details for Reason: potassium questions Summary of Call: pt her etoday in the coumadin clinic.  he was recently in the hospital for CVA.  He is also end-stage renal disease and on dialysis.  He states he was told by his "kidney doctor" not to take potassium but he was discharged on it.  On review of pt's labs at the time of discharge - pt's potassium level was 3.1.  Pt was discharged on 10 mEq twice a day.  Instructed pt, while I understand why he was told not to take potassium -in this case he needs to take it (his potassium level is already low and dialysis is going to continue to decrease it).  Pt states understanding and aware I will verify (at pt's request) with Dr Percival Spanish that he should be taking potassium supplement.   Initial call taken by: Sim Boast, RN,  June 19, 2010 3:50 PM  Follow-up for Phone Call        [name] to take potassium as prescribed Follow-up by: Minus Breeding, MD, [facility],  June 19, 2010 5:31 PM  Additional Follow-up for Phone Call Additional follow up Details #1::        pt aware to take potassium supplement.  Additional Follow-up by: Sim Boast, RN,  June 19, 2010 5:46 PM

## 2010-11-21 NOTE — Progress Notes (Signed)
Summary:  home heath checking PTINR sending to [facility]  rx sent  Phone Note Call from Patient Call back at Home Phone [phone]   Caller: Daughter/ Claiborne Billings Summary of Call: Pt daughter have question about home heath checking the pt PTINR Initial call taken by: Delsa Sale,  May 17, 2010 4:42 PM  Follow-up for Phone Call        pt on coumadin 2 mg a day  PT/INR drawn 05/16/10 was 30.8/2.26.  pt to continue the same, needs recheck in 2 weeks [facility] [address]  RX will be faxed to [facility] Follow-up by: Sim Boast, RN,  May 17, 2010 5:13 PM    Prescriptions: COUMADIN 2 MG TABS (WARFARIN SODIUM) as directed  #30 x 2   Entered by:   Sim Boast, RN   Authorized by:   Minus Breeding, MD, [facility]   Signed by:   Sim Boast, RN on 05/17/2010   Method used:   Electronically to        [facility] (retail)       [address]       [place], [place]  [address]       Ph: [phone] or [phone]       Fax: [phone]   RxID:   [id number]

## 2010-11-21 NOTE — Consult Note (Signed)
Summary: Consult    NAME:  [name] Brown, [name] Brown NO.:  [id number]      MEDICAL RECORD NO.:  [id number]          PATIENT TYPE:  INP      LOCATION:  0110                         [facility]ACILITY[facility]  [facility]      PHYSICIAN:  Dickey Gave, MDDATE OF BIRTH:  1936/08/11      DATE OF CONSULTATION:   DATE OF DISCHARGE:                                    CONSULTATION      PRIMARY CARE PHYSICIAN:  Dr. Delma Freeze.      Marland KitchenCARDIOLOGIST:  Dr. Marijo File.      CONSULTATION:  Dr. Abagail Kitchens.      TRIAD HOSPITALIST:   Dr. Gean Birchwood.      CHIEF COMPLAINT:  Nausea, vomiting, diarrhea and shortness of breath.      HISTORY OF PRESENT ILLNESS:  This a 63-year male with multiple medical   problems who has a history of worsening shortness of breath for the last   2 months.  Was treated with prednisone initially was better.  His   diabetes is not well controlled after this, and he has worked to get   this under better control since then with his regular doctor, Dr. Sarajane Jews.   He has continued to have shortness of breath. Dr. Sarajane Jews saw him yesterday   morning where he had increased shortness of breath, vomiting, diarrhea.   He was given a Z-Pak, what sounds like some antibiotics and Phenergan.   Was due to see Dr. Sarajane Jews again today if did not worsen, and Dr. Sarajane Jews told   me he would admit him if he was no better.  He began worsening   overnight, and his family got him to come to the hospital.  He again   complaints of shortness of breath that is better with the oxygen that is   on him now.  He does state he had thrown up several times at home as   well as in the emergency room.  He does not complain about abdominal   pain to me.  He does not complain about a history of abdominal pain that   is associated with food  at all.  He only complains that over the past   several years he has had some bloating, but no real symptoms specific to   his gallbladder.  Denies any fevers at home.  There  is no real   aggravating or relieving factors for shortness of breath or nausea or   vomiting.  He has no real sick contacts that he knows about.  He lives   by himself.      PAST MEDICAL HISTORY:   1. Coronary artery disease.   2. Aortic stenosis.  His last note where he saw Dr. Percival Spanish December       2009, he was recommended to get an echocardiogram in 6 months and       then the discussion was about surgery in the next year potentially.   3. Hypertension.   4. Hypercholesterolemia.   5. Gout.   6. Morbid obesity.  PAST SURGICAL HISTORY:  Appendectomy.      SOCIAL HISTORY:  Nonsmoker, does not drink alcohol.  He lives alone.      ALLERGIES:  Penicillin causes hives.      MEDICATIONS:  Colchicine, folate, Lipitor, Benicar, Lantus, aspirin,   Lasix, Klor-Con, multivitamin, glyburide, metformin, allopurinol,   Tricor, Bystolic, Ambien.      FAMILY HISTORY:  Colon cancer.      PHYSICAL EXAMINATION:  VITAL SIGNS:  97.4, 81, 167/80 and 17.   GENERAL:  He is an elderly male.   HEENT:  Sclerae are anicteric.   HEART:  Regular rate and rhythm, 2/6 systolic murmur.   LUNGS:  Decreased at the bases.   ABDOMEN:  Obese, minimally tender in his right upper quadrant.  He has   no Murphy sign.      LABORATORY DATA:  White blood cell count 14.6 with 88 segs, 30   hematocrit, platelets 146.  Potassium 5.3, BUN 44, creatinine 3.39,   glucose 120.  PT is 27 and INR is 2.52.  Urinalysis shows protein and   moderate blood.  Venous lactic acid is 2.6, lipase 44.  His alkaline   phosphatase is 67, AST greater than 10,000, ALT 4872, total bilirubin is   2.9.  CT scan shows a small amount of free fluid around his liver out of   his pancreas with cystic lesions, gallstones, questionable cholecystitis   and small amount of free fluid.      ASSESSMENT:  Acute hepatic dysfunction with questionable acute cholecystitis,   acute renal failure and new coagulopathy.      PLAN:  I am not entirely  sure that cholecystitis is the cause of all of   his current symptoms.  He certainly has a number of other metabolic   derangements.  I am most concerned about his transaminases.  I think it   is reasonable that if he is able to get a HIDA scan that if this is   positive then would recommend a percutaneous cholecystostomy as opposed   to operation due to his multiple risk factors and his current state of   health.  If this does not solve the problem, then operation could be   used as a second option, but I think it best if he does have   cholecystitis would be to manage with drain and antibiotics.   1. I agree with a GI evaluation.  Will await to hear what Dr. Ardis Hughs       says.   2. Would have Cardiology evaluate as well due to Dr. Rosezella [place] last       note in there.  This is a major determining factor in the treatment       of his gallbladder disease if needed and it sounds like with his       increasing shortness of       breath over the past couple months that this should certainly be       reevaluated.  I also agree with aggressive resuscitation and       monitoring to make sure his lactate hopefully will clear.  This may       take a little bit longer given some degree of hepatic dysfunction       that is going on right now as well.               Dickey Gave, MD  MCW/MEDQ  D:  12/14/2009  T:  12/14/2009  Job:  [id number]      cc:   Annie [address] A. Sarajane Jews, [place] [address]      James Hochrein, MD, [place] [address]  [address]      [place] [address]      Philip M. Nira Retort, M.D.   [place] [address]      Rise Patience, MD      Electronically Signed by Rolm Bookbinder MD on 12/19/2009 08:14:48 AM

## 2010-11-21 NOTE — Progress Notes (Signed)
Summary: refill cough syrup  Phone Note Refill Request Message from:  Fax from Pharmacy on Feb 19, 2010 10:51 AM  Refills Requested: Medication #1:  Hydrocodone-Homatropine syrup   Dosage confirmed as above?Dosage Confirmed   Last Refilled: 01/05/2010   Notes: 237ml  Method Requested: Telephone to Pharmacy Initial call taken by: Chipper Oman, RN,  Feb 19, 2010 10:52 AM [facility]anda [facility]using [phone]*  Follow-up for Phone Call        use 1 tsp q 4 hours as needed cough. Call in 240 ml with no RF Follow-up by: Laurey Morale MD,  Feb 19, 2010 12:57 PM

## 2010-11-21 NOTE — Medication Information (Signed)
Summary: rov/sel  Anticoagulant Therapy  Managed by: [name], PharmD Referring MD: Percival Spanish PCP: Alysia Penna, MD Supervising MD: Aundra Dubin MD, [place] Indication 1: Atrial Fibrillation Lab Used: [facility] [place] Site: [place] INR POC 1.8 INR RANGE 2.0-3.0           Allergies: 1)  Penicillin V Potassium (Penicillin V Potassium)  Anticoagulation Management History:      His anticoagulation is being managed by telephone today.  Positive risk factors for bleeding include an age of 73 years or older and presence of serious comorbidities.  The bleeding index is 'intermediate risk'.  Positive CHADS2 values include History of CHF, History of HTN, and History of Diabetes.  Negative CHADS2 values include Age > 73 years old.  His last INR was 1.4 ratio.  Anticoagulation responsible provider: Aundra Dubin MD, [place].  INR POC: 1.8.  Cuvette Lot#: [id number].  Exp: 08/2011.    Anticoagulation Management Assessment/Plan:      The patient's current anticoagulation dose is Coumadin 2 mg tabs: as directed.  The target INR is 2.0-3.0.  The next INR is due 08/21/2010.  Anticoagulation instructions were given to patient.  Results were reviewed/authorized by [name], PharmD.  He was notified by [name] PharmD.         Prior Anticoagulation Instructions: INR 1.6  Take an extra tablet today. Then take 1 tablet everyday except 1 1/2 tablet on Wednesday. Recheck in 1 week.   Current Anticoagulation Instructions: INR 1.8  Take an extra 1/2 tablet today then increase dose to 1 tablets every day except 1 1/2 tablets on Monday, Wednesday and Friday.  Recheck INR in 1 week.

## 2010-11-21 NOTE — Miscellaneous (Signed)
Summary: Physician's Orders/[facility]   Physician's Orders/[facility]   Imported By: Laural Benes 01/11/2010 13:44:50  _____________________________________________________________________  External Attachment:    Type:   Image     Comment:   External Document

## 2010-11-21 NOTE — Assessment & Plan Note (Signed)
Summary: eph   Referring Provider:  n/a Primary Provider:  Alysia Penna, MD   History of Present Illness: The patient is 75 years old and came today for an unscheduled visit because of atrial fibrillation and fast rates. He was recently hospitalized with congestive heart failure and known severe aortic stenosis and underwent aortic valve replacement and bypass surgery. His ejection fraction is 40-50%. He had chronic renal insufficiency and developed renal failure requiring dialysis. He also developed a sacral decubitus. He had an AV fistula put in his right arm in preparation for dialysis. He was discharged to [facility] where he is undergoing dialysis 3-5 times a week.  He saw Dr. Roxan Hockey yesterday in the office and was found to have a fast heart rate is irregular and was sent here. His ECG today showed atrial fibrillation with a rate of 93.  He says his sacral decubitus is making good progress. He has been able to ambulate. He helped relieve the rehabilitation center within the next week or 2. He also is hopeful that he will not require long-term dialysis.  Current Medications (verified): 1)  Allopurinol 300 Mg  Tabs (Allopurinol) .... Once Daily 2)  Aspirin 81 Mg  Tabs (Aspirin) .... Daily 3)  Flonase 50 Mcg/act Susp (Fluticasone Propionate) .... 2 Sprays Each Nostril Q Day 4)  Lantus 100 Unit/ml Soln (Insulin Glargine) .... 20 Units At Bedtime 5)  Colcrys 0.6 Mg Tabs (Colchicine) .... Take As Needed For Gout 6)  Tylenol 325 Mg Tabs (Acetaminophen) .... As Needed 7)  Amiodarone Hcl 200 Mg Tabs (Amiodarone Hcl) .Marland Kitchen.. 1 By Mouth Two Times A Day 8)  Debrox 6.5 % Soln (Carbamide Peroxide) .... As Directed 9)  Santyl 250 Unit/gm Oint (Collagenase) .... As Directed 10)  Aranesp (Albumin Free) 200 Mcg/0.42ml Soln (Darbepoetin Alfa-Polysorbate) .... As Directed 11)  Venofer 20 Mg/ml Soln (Iron Sucrose) .... As Directed 12)  Benadryl 25 Mg Caps (Diphenhydramine Hcl) .... As  Needed 13)  Guaifenesin 200 Mg Tabs (Guaifenesin) .... As Directed 14)  Tussin 100 Mg/70ml Syrp (Guaifenesin) .... As Needed 15)  Levothroid 25 Mcg Tabs (Levothyroxine Sodium) .Marland Kitchen.. 1 By Mouth Daily 16)  Lovenox 40 Mg/0.68ml Soln (Enoxaparin Sodium) .... As Directed 17)  Toprol Xl 25 Mg Xr24h-Tab (Metoprolol Succinate) .Marland Kitchen.. 1 By Mouth At Bedtime 18)  Nephro-Vite Rx 1 Mg Tabs (B Complex-C-Folic Acid) .... As Directed 19)  Protonix 40 Mg Tbec (Pantoprazole Sodium) .Marland Kitchen.. 1 By Mouth Daily 20)  Zemplar 4 Mcg Caps (Paricalcitol) .... As Directed M-W-F 21)  Crestor 10 Mg Tabs (Rosuvastatin Calcium) .Marland Kitchen.. 1 By Mouth Daily` 22)  Sarna 0.5-0.5 % Lotn (Camphor-Menthol) .... Daily 23)  Sorbitol 3 % Soln (Sorbitol) .... As Directed 24)  Ultram 50 Mg Tabs (Tramadol Hcl) .... As Needed 25)  Coumadin 2 Mg Tabs (Warfarin Sodium) .... As Directed 26)  Miralax  Powd (Polyethylene Glycol 3350) .... As Directed  Allergies (verified): 1)  Penicillin V Potassium (Penicillin V Potassium)  Past History:  Past Medical History: Reviewed history from 01/15/2010 and no changes required. Coronary artery disease (non obstructive) Non- ST elevation MI   11-2009(secondary to supply demand mismatch) SEVERE AORTIC STENOSIS Hyperlipidemia Hypertension Diabetes mellitus, type 2 LE edema Mild carotid artery disease, Dopplers in 2007 Gout Obesity Renal insufficiency, sees Dr. Florene Glen Idiopathic hepatitis 11-2009  Review of Systems       ROS is negative except as outlined in HPI.   Vital Signs:  Patient profile:   75 year old male Height:  72.5 inches Pulse rate:   93 / minute Resp:     18 per minute BP sitting:   117 / 74  (left arm)  Vitals Entered By: Lubertha Basque, CNA (May 15, 2010 2:49 PM)  Physical Exam  Additional Exam:  Gen. Well-nourished, in no distress   Neck: No JVD, thyroid not enlarged, no carotid bruits Lungs: No tachypnea, clear without rales, rhonchi or wheezes Cardiovascular: Rhythm  irregular, PMI not displaced,  heart sounds  normal, no murmurs or gallops, no peripheral edema, pulses normal in all 4 extremities. Abdomen: BS normal, abdomen soft and non-tender without masses or organomegaly, no hepatosplenomegaly. MS: No deformities, no cyanosis or clubbing   Neuro:  No focal sns   Skin:  no lesions    Impression & Recommendations:  Problem # 1:  ATRIAL FIBRILLATION (ICD-427.31) He has had AF since CABG/AVR.  Rate is not quite well controlled.  Will increase mitoprolol and give in the evenings after dialysis as below.  Decide about cardioversion later. His updated medication list for this problem includes:    Aspirin 81 Mg Tabs (Aspirin) .Marland Kitchen... Daily    Amiodarone Hcl 200 Mg Tabs (Amiodarone hcl) .Marland Kitchen... 1 by mouth two times a day    Metoprolol Succinate 50 Mg Xr24h-tab (Metoprolol succinate) .Marland Kitchen... Take one tablet by mouth every evening- hold for systolic blood pressure less than 100.    Coumadin 2 Mg Tabs (Warfarin sodium) .Marland Kitchen... As directed  Orders: EKG w/ Interpretation (93000)  Problem # 2:  AORTIC VALVE DISORDERS (ICD-424.1) He is S/P  AVR/CABG.  No CP.  This problem is stable. The following medications were removed from the medication list:    Furosemide 40 Mg Tabs (Furosemide) .Marland Kitchen... 2 two times a day His updated medication list for this problem includes:    Metoprolol Succinate 50 Mg Xr24h-tab (Metoprolol succinate) .Marland Kitchen... Take one tablet by mouth every evening- hold for systolic blood pressure less than 100.  Problem # 3:  CONGESTIVE HEART FAILURE (ICD-428.00) He appears euvolemic.  Volume currently controlled with dialysis. The following medications were removed from the medication list:    Furosemide 40 Mg Tabs (Furosemide) .Marland Kitchen... 2 two times a day His updated medication list for this problem includes:    Aspirin 81 Mg Tabs (Aspirin) .Marland Kitchen... Daily    Amiodarone Hcl 200 Mg Tabs (Amiodarone hcl) .Marland Kitchen... 1 by mouth two times a day    Lovenox 40 Mg/0.69ml Soln  (Enoxaparin sodium) .Marland Kitchen... As directed    Metoprolol Succinate 50 Mg Xr24h-tab (Metoprolol succinate) .Marland Kitchen... Take one tablet by mouth every evening- hold for systolic blood pressure less than 100.    Coumadin 2 Mg Tabs (Warfarin sodium) .Marland Kitchen... As directed  Problem # 4:  RENAL INSUFFICIENCY (ICD-588.4) He has ESRD on dialysis. This is stable.  He would like to leave the rehab center and go home.  Will defer to Dr Percival Spanish, renal and cardiac surgery.  Patient Instructions: 1)  Increase toprol xl (metoprolol succ) to 50mg  every evening.- hold toprol for systolic blood pressure less than 100.  2)  Keep your followup appointment on  tuesday 06/05/10 at 12:45pm with Dr. Rosezella Florida PA.

## 2010-11-21 NOTE — Letter (Signed)
Summary: Cardioversion/TEE Instructions  [facility], [facility] [address] [place]   [place], [place] [address]   Phone: [phone]  Fax: [phone]    Cardioversion / TEE Cardioversion Instructions  08/28/2010 MRN: [id number]  [facility] [address] [place], [place]  [address]  Dear Mr. Gilvin, You are scheduled for a TEE Cardioversion Monday, September 03, 2010 on with Dr.Brian Crenshaw   Please arrive at the [facility] at 11 am on the day of your procedure.  1)   DIET:  A)   Nothing to eat or drink after midnight except your medications with a sip of water.   2)   MAKE SURE YOU TAKE YOUR COUMADIN.  3)   A)   DO NOT TAKE these medications before your procedure:      Digoxin the morning of the procedure       B)   YOU MAY TAKE ALL of your remaining medications with a small amount of water.   4)  Must have a responsible person to drive you home.  5)   Bring a current list of your medications and current insurance cards.   * Special Note:  Every effort is made to have your procedure done on time. Occasionally there are emergencies that present themselves at the hospital that may cause delays. Please be patient if a delay does occur.  * If you have any questions after you get home, please call the office at [phone].

## 2010-11-21 NOTE — Assessment & Plan Note (Signed)
Review of gastrointestinal problems: 1. Precancerous colon polyps, adenomas removed 04/2006, recall was recommended at 3 years interval. 2. Acute hepatitis 11/2008:LFTs when he came were elevated at AST greater than 10,000, ALT 4800, bilirubin 2.6, and over a period of time, they have come down.  The last reading was AST of 83 and ALT of 439, total bilirubin still hanging around 2.3.   Thought maybe due to statin, stopped the statins and has not     History of Present Illness Visit Type: Initial Visit Primary GI MD: [name] Loffler MD Primary Provider: Alysia Penna, MD Requesting Provider: n/a Chief Complaint: Patient here for 4 week f/u for nausea and bloating History of Present Illness:     very pleasant 75 year old man who I have not seen in about 4 years. She was recently hospitalized with an acute hepatitis of unclear etiology. He does have congestive heart failure by his cardiologist does not think his heart is nearly week of took caused such significant elevations in his liver tests, I do agree with this. Acute hepatitis panel is negative, ANA was normal. He had an ultrasound actually suggested possible acute cholecystitis however HIDA scan was done and this ruled out. He does have gallstones in his gallbladder. In the end statins were implicated as possible cause and he is not restart his statin medicines. He was nauseous around this time however his nausea has completely resolved and he has not been nauseous for 2-3 weeks at least.  he had nausea that lasted for about 2 weeks.    He had recent hospitalization with increased liver tests, which compl  nausea is gone.           Current Medications (verified): 1)  Furosemide 40 Mg Tabs (Furosemide) .... 2 in Am and 1 in Pm 2)  Allopurinol 300 Mg  Tabs (Allopurinol) .... Once Daily 3)  Bystolic 20 Mg Tabs (Nebivolol Hcl) .Marland Kitchen.. 1 By Mouth Daily 4)  Aspirin 81 Mg  Tabs (Aspirin) .... Daily 5)  Folic Acid 1 Mg Tabs (Folic Acid) ....  Once Daily 6)  Flonase 50 Mcg/act Susp (Fluticasone Propionate) .... 2 Sprays Each Nostril Q Day 7)  Lantus 100 Unit/ml Soln (Insulin Glargine) .... 20 Units At Bedtime 8)  Promethazine Hcl 25 Mg Tabs (Promethazine Hcl) .Marland Kitchen.. 1 Q 4 Hours As Needed Nausea 9)  Colcrys 0.6 Mg Tabs (Colchicine) .... Take As Needed For Gout  Allergies (verified): 1)  Penicillin V Potassium (Penicillin V Potassium)  Vital Signs:  Patient profile:   75 year old male Height:      72.5 inches Weight:      255.13 pounds BMI:     34.25 BSA:     2.38 Pulse rate:   72 / minute Pulse rhythm:   regular BP sitting:   130 / 90  (right arm)  Vitals Entered By: [name] Brown) (January 30, 2010 2:45 PM)  Physical Exam  Additional Exam:  Constitutional: generally well appearing Psychiatric: alert and oriented times 3 Abdomen: soft, non-tender, non-distended, normal bowel sounds    Impression & Recommendations:  Problem # 1:  recent acute hepatitis, unclear etiology seems unlikely that his very dramatic liver test elevations were from CHF. I don't believe he had significant hypotensive episode occurred cause ischemic hepatitis but that pattern of liver tests is commonly seen in ischemic hepatitis and so perhaps that was the cause. I doubt statins caused it but that may contribute to his liver irritation I agree with holding them for  now. I have never seen statins cause such significantly elevated transaminases. Transient common duct stone can cause very significantly elevated liver tests. He doesn't gallbladder full of stones. I will arrange for an MR CP to be done to check to see if he has gallstones in his common duct and if he does he will need elective ERCP. If that test is negative I think we should follow him clinically to see if this symptom complex returns. We will also recheck his liver tests now including a CBC, complete metabolic profile, sedimentation rate.  Other Orders: TLB-BMP (Basic Metabolic  Panel-BMET) (99991111) TLB-Hepatic/Liver Function Pnl (80076-HEPATIC) TLB-Sedimentation Rate (ESR) (85652-ESR)  Patient Instructions: 1)  You will be scheduled for an MRCP to check for CBD stones. 2)  You will get lab test(s) done today (lfts, bmet, esr). 3)  The medication list was reviewed and reconciled.  All changed / newly prescribed medications were explained.  A complete medication list was provided to the patient / caregiver.  Appended Document: Orders Update/mrcp    Clinical Lists Changes  Problems: Added new problem of ABDOMINAL BLOATING (ICD-787.3) Orders: Added new Referral order of MRCP (MRCP) - Signed

## 2010-11-21 NOTE — Progress Notes (Signed)
Summary: Go to ER per Dr. Sarajane Jews.  Phone Note Call from Patient   Caller: Patient Call For: Laurey Morale MD Summary of Call: Pt calls complaining of nausea,  cramping, and diarrhea.  No fever, chills.  No appetite, but is eating because of DM.  BS 120 1 hr ago.  Initial call taken by: Deanna Artis CMA,  January 08, 2010 3:47 PM  Follow-up for Phone Call        Spoke to pt and he will go to the ER. per Dr. Sarajane Jews. Follow-up by: Deanna Artis CMA,  January 08, 2010 3:59 PM  Additional Follow-up for Phone Call Additional follow up Details #1::        agreed Additional Follow-up by: Laurey Morale MD,  January 08, 2010 5:33 PM

## 2010-11-21 NOTE — Letter (Signed)
Summary: order for removal of right jugular catheter  [place] [place] [address] [place]   [place], [place] [address]   Phone: [phone]  Fax: [phone]          September 21, 2010 MRN: [id number]    RE:   [name] Brown   [address]   [place],   [address]   DOB Dec 29, 1935      Please Remove Right Internal Jugular Hemodialysis catheter; it is no longer needed.         V.O. Dr Neil Crouch, RN

## 2010-11-21 NOTE — Progress Notes (Signed)
Summary: REQ FOR REFILL  Phone Note Refill Request   Refills Requested: Medication #1:  ZOLPIDEM TARTRATE 10MG  TABLET   Notes: Qty-30 EA..... Refills-5...[place].    Initial call taken by: Duanne Moron,  Mar 20, 2010 10:46 AM  Follow-up for Phone Call        call in #30 with 5 rf Follow-up by: Laurey Morale MD,  Mar 20, 2010 2:17 PM    New/Updated Medications: ZOLPIDEM TARTRATE 10 MG TABS (ZOLPIDEM TARTRATE) 1 at bedtime as needed Prescriptions: ZOLPIDEM TARTRATE 10 MG TABS (ZOLPIDEM TARTRATE) 1 at bedtime as needed  #30 x 5   Entered by:   Chipper Oman, RN   Authorized by:   Laurey Morale MD   Signed by:   Chipper Oman, RN on 03/20/2010   Method used:   Historical   [id number]

## 2010-11-21 NOTE — Assessment & Plan Note (Signed)
Summary: 1 wk rov/njr   Vital Signs:  Patient profile:   75 year old male Weight:      256 pounds Temp:     98.2 degrees F oral Pulse rate:   77 / minute BP sitting:   106 / 70  (left arm) Cuff size:   large  Vitals Entered By: Townsend Roger, [place] (November 27, 2009 1:07 PM) CC: f/u on diabetes, bs was 147 this morning   History of Present Illness: Here to follow up on type II DM which now requires insulin. One  week ago he started on Lantus in the evenings.  He has done extremely well since then. His glucoses have steadily decreases to the 200s for several days, and now the 100s for 2 days. His fasting glucose this am was 147. he feels much better and has more strength.   Current Medications (verified): 1)  Furosemide 40 Mg Tabs (Furosemide) .... 2 in Am and 1 in Pm 2)  Glyburide-Metformin 5-500 Mg Tabs (Glyburide-Metformin) .... Two  Tabs  Two Times A Day 3)  Ambien 10 Mg  Tabs (Zolpidem Tartrate) .... One At Bedtime 4)  Lipitor 20 Mg Tabs (Atorvastatin Calcium) .... Take 1 Tab By Mouth Daily 5)  Ultravate 0.05 %  Oint (Halobetasol Propionate) .... Apply Two Times A Day As Needed Rash 6)  Allopurinol 300 Mg  Tabs (Allopurinol) .... Once Daily 7)  Bystolic 20 Mg Tabs (Nebivolol Hcl) .Marland Kitchen.. 1 By Mouth Daily 8)  Tricor 145 Mg Tabs (Fenofibrate) .Marland Kitchen.. 1 By Mouth Once Daily 9)  Aspirin 81 Mg  Tabs (Aspirin) .... Daily 10)  Vicodin 5-500 Mg Tabs (Hydrocodone-Acetaminophen) .... One By Mouth 6 Hours As Needed 11)  Folic Acid 1 Mg Tabs (Folic Acid) .... Once Daily 12)  Flonase 50 Mcg/act Susp (Fluticasone Propionate) .... 2 Sprays Each Nostril Q Day 13)  Januvia 100 Mg Tabs (Sitagliptin Phosphate) .... Once Daily 14)  Lantus Solostar 100 Unit/ml Soln (Insulin Glargine) .... Use 30 Units At Bedtime  Allergies (verified): 1)  Amoxicillin (Amoxicillin) 2)  Penicillin V Potassium (Penicillin V Potassium)  Past History:  Past Medical History: Reviewed history from 11/02/2009 and no changes  required. Coronary artery disease (non obstructive) Hyperlipidemia Hypertension Diabetes mellitus, type 2 LE edema Mild carotid artery disease, Dopplers in 2007 Gout Obesity Aortic Stenosis Renal insufficiency, sees Dr. Florene Glen  Review of Systems  The patient denies anorexia, fever, weight loss, weight gain, vision loss, decreased hearing, hoarseness, chest pain, syncope, dyspnea on exertion, peripheral edema, prolonged cough, headaches, hemoptysis, abdominal pain, melena, hematochezia, severe indigestion/heartburn, hematuria, incontinence, genital sores, muscle weakness, suspicious skin lesions, transient blindness, difficulty walking, depression, unusual weight change, abnormal bleeding, enlarged lymph nodes, angioedema, breast masses, and testicular masses.    Physical Exam  General:  Well-developed,well-nourished,in no acute distress; alert,appropriate and cooperative throughout examination   Impression & Recommendations:  Problem # 1:  DIABETES MELLITUS, TYPE II (ICD-250.00)  His updated medication list for this problem includes:    Glyburide-metformin 5-500 Mg Tabs (Glyburide-metformin) .Marland Kitchen..Marland Kitchen Two  tabs  two times a day    Aspirin 81 Mg Tabs (Aspirin) .Marland Kitchen... Daily    Januvia 100 Mg Tabs (Sitagliptin phosphate) ..... Once daily    Lantus Solostar 100 Unit/ml Soln (Insulin glargine) ..... Use 30 units at bedtime  Complete Medication List: 1)  Furosemide 40 Mg Tabs (Furosemide) .... 2 in am and 1 in pm 2)  Glyburide-metformin 5-500 Mg Tabs (Glyburide-metformin) .... Two  tabs  two times a  day 3)  Ambien 10 Mg Tabs (Zolpidem tartrate) .... One at bedtime 4)  Lipitor 20 Mg Tabs (Atorvastatin calcium) .... Take 1 tab by mouth daily 5)  Ultravate 0.05 % Oint (Halobetasol propionate) .... Apply two times a day as needed rash 6)  Allopurinol 300 Mg Tabs (Allopurinol) .... Once daily 7)  Bystolic 20 Mg Tabs (Nebivolol hcl) .Marland Kitchen.. 1 by mouth daily 8)  Tricor 145 Mg Tabs (Fenofibrate)  .Marland Kitchen.. 1 by mouth once daily 9)  Aspirin 81 Mg Tabs (Aspirin) .... Daily 10)  Vicodin 5-500 Mg Tabs (Hydrocodone-acetaminophen) .... One by mouth 6 hours as needed 11)  Folic Acid 1 Mg Tabs (Folic acid) .... Once daily 12)  Flonase 50 Mcg/act Susp (Fluticasone propionate) .... 2 sprays each nostril q day 13)  Januvia 100 Mg Tabs (Sitagliptin phosphate) .... Once daily 14)  Lantus Solostar 100 Unit/ml Soln (Insulin glargine) .... Use 30 units at bedtime  Patient Instructions: 1)  His diabetes is responding quite well to the combination of oral meds and Lantus. We will get fasting labs this week, and I will plan to see him back in one month. Keep current meds the same for now.

## 2010-11-21 NOTE — Progress Notes (Signed)
Summary: Re-schedule procedure TEE/cardioversion  Phone Note Call from Patient Call back at Home Phone [phone]   Caller: Patient Reason for Call: Talk to Nurse Initial call taken by: Delsa Sale,  September 06, 2010 2:33 PM  Follow-up for Phone Call        pt states he was not told not to eat before his TEE and the procedure is having to be rescheduled.  Paged Trish at the hospital - [facility] returned the call and stated he will find out when it was rescheduled for and call the pt.  verbal instructions had been given to the patient (including NPO after MN) prior to his procedure and written instructions had been left for him at the front desk which he did not pick up. Follow-up by: Sim Boast, RN,  September 06, 2010 3:45 PM     Appended Document: remove cath then TEE/cardioversion INR needs to be 1.7 per Dr Percival Spanish.  pt needs pericutaneous cath removed to decrease the risk of infection now that his A/V fistula is functioning for hemo-dialysis.  Pt needs to be scheduled thru Interventional Radiology at [facility] once he has been bridged with Lovenox and his INR to 1.7 or less.  Tula Nakayama, RN in [facility] is aware.   He may then be scheduled for TEE/cardioversion as previously discussed.

## 2010-11-21 NOTE — Progress Notes (Signed)
  Phone Note Call from Patient Call back at Home Phone [phone]   Caller: Patient Summary of Call: he called yesterday asking about a laxative since he has a BM onl every 3 days. Initial call taken by: Laurey Morale MD,  May 25, 2010 8:36 AM  Follow-up for Phone Call        Tell him to use Miralax once or twice daily, and after several days he should get back to normal Follow-up by: Laurey Morale MD,  May 25, 2010 8:37 AM  Additional Follow-up for Phone Call Additional follow up Details #1::        Phone Call Completed Additional Follow-up by: Chipper Oman, RN,  May 25, 2010 9:06 AM

## 2010-11-21 NOTE — Miscellaneous (Signed)
Summary: Physician Interim Orders/[facility]  Physician Interim Orders/[facility]   Imported By: Laural Benes 07/10/2010 09:33:45  _____________________________________________________________________  External Attachment:    Type:   Image     Comment:   External Document

## 2010-11-21 NOTE — Progress Notes (Signed)
Summary: speak to dr  Phone Note Call from Patient Call back at [phone]   Caller: Daughter/Kelly Reason for Call: Talk to Doctor Summary of Call: pt is in the hospital.... Dr Percival Spanish saw the pt this am, family request call back Initial call taken by: Darnell Level,  March 21, 2010 9:24 AM  Follow-up for Phone Call        Called and spoke with family.

## 2010-11-21 NOTE — Assessment & Plan Note (Signed)
Summary: 1 month rov./sl   Visit Type:  Follow-up Referring Provider:  n/a Primary Provider:  Alysia Penna, MD  CC:  AS.  History of Present Illness: The patient presents for followup of his aortic stenosis. There is an extensive note from the last visit in which he had been recovering from an acute hepatitis. At that visit he was still quite nauseated and throwing up. I did not think this had a cardiac etiology and refer him back to GI. However, workup is still unrevealing as to the cause of his severe transaminitis. He does say that his nausea and vomiting has resolved.  As part of this hospitalization he was found to have worsening aortic stenosis with a valve area of 0.71. The plan was to follow this up once he recovered sufficiently from his acute episode. Since I last saw him he has had less dyspnea and is able to ambulate further. He's not having any resting shortness of breath, PND or orthopnea. He is not describing chest discomfort, neck or arm discomfort. He has not had palpitations, presyncope or syncope. However, he has had abdominal distention and what he believes to be fluid in his abdomen as well as increasing lower extremity edema.  Current Medications (verified): 1)  Furosemide 40 Mg Tabs (Furosemide) .... 2 in Am and 1 in Pm 2)  Allopurinol 300 Mg  Tabs (Allopurinol) .... Once Daily 3)  Bystolic 20 Mg Tabs (Nebivolol Hcl) .Marland Kitchen.. 1 By Mouth Daily 4)  Aspirin 81 Mg  Tabs (Aspirin) .... Daily 5)  Folic Acid 1 Mg Tabs (Folic Acid) .... Once Daily 6)  Flonase 50 Mcg/act Susp (Fluticasone Propionate) .... 2 Sprays Each Nostril Q Day 7)  Lantus 100 Unit/ml Soln (Insulin Glargine) .... 20 Units At Bedtime 8)  Promethazine Hcl 25 Mg Tabs (Promethazine Hcl) .Marland Kitchen.. 1 Q 4 Hours As Needed Nausea 9)  Colcrys 0.6 Mg Tabs (Colchicine) .... Take As Needed For Gout  Allergies (verified): 1)  Penicillin V Potassium (Penicillin V Potassium)  Past History:  Past Medical History: Reviewed  history from 01/15/2010 and no changes required. Coronary artery disease (non obstructive) Non- ST elevation MI   11-2009(secondary to supply demand mismatch) SEVERE AORTIC STENOSIS Hyperlipidemia Hypertension Diabetes mellitus, type 2 LE edema Mild carotid artery disease, Dopplers in 2007 Gout Obesity Renal insufficiency, sees Dr. Florene Glen Idiopathic hepatitis 11-2009  Past Surgical History: Reviewed history from 01/10/2010 and no changes required. Bone spurs removed from right heel Colonoscopy 05-09-06 per Dr. Ardis Hughs, repeat 3 years ADENOMATOUS Appendectomy  Review of Systems       As stated in the HPI and negative for all other systems.   Vital Signs:  Patient profile:   75 year old male Height:      72.5 inches Weight:      274 pounds BMI:     36.78 Pulse rate:   84 / minute Resp:     16 per minute BP sitting:   120 / 87  (right arm)  Vitals Entered By: Levora Angel, CNA (Feb 26, 2010 2:00 PM)  Physical Exam  General:  Well developed, well nourished, in no acute distress. Head:  normocephalic and atraumatic Eyes:  PERRLA/EOM intact; conjunctiva and lids normal. Mouth:  Oral mucosa normal. Neck:  Neck supple, no JVD. No masses, thyromegaly or abnormal cervical nodes. Chest Wall:  no deformities or breast masses noted Lungs:  Clear bilaterally to auscultation and percussion. Abdomen:  Bowel sounds positive; abdomen soft and non-tender without masses, organomegaly, or  hernias noted. No hepatosplenomegaly, obese.  Questionable ascites Msk:  Back normal, normal gait. Muscle strength and tone normal. Extremities:  moderate bilateral lower extremity edema Neurologic:  Alert and oriented x 3. Skin:  Intact without lesions or rashes. Cervical Nodes:  no significant adenopathy Axillary Nodes:  no significant adenopathy Psych:  Normal affect.   Detailed Cardiovascular Exam  Neck    Carotids: Carotids full and equal bilaterally without bruits.  Positive transmitted  systolic murmur    Neck Veins: Normal, no JVD.    Heart    Inspection: no deformities or lifts noted.      Palpation: normal PMI with no thrills palpable.      Auscultation: regular rate and rhythm, S1, S2 without rubs, gallops, or clicks.  3/6 apical systolic murmur radiating out the aortic outflow tract and mid peaking, no diastolic murmurs.  Vascular    Abdominal Aorta: no palpable masses, pulsations, or audible bruits.      Femoral Pulses: normal femoral pulses bilaterally.      Pedal Pulses: normal pedal pulses bilaterally.      Radial Pulses: normal radial pulses bilaterally.      Peripheral Circulation: no clubbing, cyanosis, with normal capillary refill.     Impression & Recommendations:  Problem # 1:  AORTIC VALVE DISORDERS (ICD-424.1) The patient has had worse aortic stenosis identified. I still do not believe this contributed to his acute hepatitis but I do believe is causing symptoms and will be in need of treatment with valve replacement. I have discussed this at length with the patient and his family. He needs right and left heart catheterization.  Problem # 2:  RENAL INSUFFICIENCY (ICD-588.4) I reviewed his most recent creatinine was was back to baseline at 1.3. We will check this again before the above procedure.  Problem # 3:  CONGESTIVE HEART FAILURE (ICD-428.00) He does complain of cough and shortness of breath and wants to try to come off of Bystolic as his sister these complaints on this drug. I will have him wean this drug to off and will follow his symptoms and blood pressure. I will have him take an extra 20 mg of Lasix for the next 3 PM doses. He will take 40 mEq of potassium on the first day, and 20 on the next 2 days. He will then go back to his previous Lasix dose and we will be checking a basic metabolic profile. He will wear compression stockings and keep his feet elevated.  Patient Instructions: 1)  Your physician recommends that you schedule a follow-up  appointment in 4 months with Dr Percival Spanish 2)  Your physician has recommended you make the following change in your medication: Decrease Bystolic to 1/2 tablet for 3 days then stop. 3)  Take extra Furosmide 20 mg or three days, 4)  Take Potassium Chloride 20 mEq two today and once a day thereafter for 2 days. 5)  Your physician has requested that you have a cardiac catheterization.  Cardiac catheterization is used to diagnose and/or treat various heart conditions. Doctors may recommend this procedure for a number of different reasons. The most common reason is to evaluate chest pain. Chest pain can be a symptom of coronary artery disease (CAD), and cardiac catheterization can show whether plaque is narrowing or blocking your heart's arteries. This procedure is also used to evaluate the valves, as well as measure the blood flow and oxygen levels in different parts of your heart.  For further information please visit [email or web address].  Please  follow instruction sheet, as given. Prescriptions: POTASSIUM CHLORIDE CRYS CR 20 MEQ CR-TABS (POTASSIUM CHLORIDE CRYS CR) take two today and then one once a day for 2 days  #30 x 0   Entered by:   Sim Boast, RN   Authorized by:   Minus Breeding, MD, [facility]   Signed by:   Sim Boast, RN on 02/26/2010   Method used:   Electronically to        [facility] [phone]* (retail)       [address]       [place], [place]  [address]       Ph: [phone] or [phone]       Fax: [phone]   RxID:   [id number]

## 2010-11-21 NOTE — Miscellaneous (Signed)
  Clinical Lists Changes  Observations: Added new observation of CXR RESULTS:   Findings: There is marked cardiomegaly but no pulmonary edema.  No   focal airspace disease or effusion.    IMPRESSION:   Cardiomegaly without acute disease.  (12/20/2009 14:24) Added new observation of ECHOINTERP: - Left ventricle: The cavity size was mildly dilated. There was mild     concentric hypertrophy. Systolic function was mildly reduced. The     estimated ejection fraction was in the range of 45% to 50%.     Doppler parameters are consistent with restrictive physiology,     indicative of decreased left ventricular diastolic compliance     and/or increased left atrial pressure.   - Aortic valve: There was severe stenosis. Mild regurgitation. Valve     area: 0.71cm^2(VTI). Valve area: 0.78cm^2 (Vmax).   - Mitral valve: Calcified annulus. Mild regurgitation.   - Left atrium: The atrium was moderately dilated.   - Pulmonary arteries: Systolic pressure was moderately to severely     increased. PA peak pressure: 26mm Hg (S).   - Pericardium, extracardiac: A trivial pericardial effusion was     identified posterior to the heart.   Impressions:    - Technically very limited study due to poor sound wave     transmission. LV endocardium never seen well. Anterior wall not     visualized - suspect anterior hypokinesis. EF roughly estimated at     45- 50%. Would suggets TEE to further evaluate. (12/15/2009 14:23)      Echocardiogram  Procedure date:  12/15/2009  Findings:      - Left ventricle: The cavity size was mildly dilated. There was mild     concentric hypertrophy. Systolic function was mildly reduced. The     estimated ejection fraction was in the range of 45% to 50%.     Doppler parameters are consistent with restrictive physiology,     indicative of decreased left ventricular diastolic compliance     and/or increased left atrial pressure.   - Aortic valve: There was severe stenosis.  Mild regurgitation. Valve     area: 0.71cm^2(VTI). Valve area: 0.78cm^2 (Vmax).   - Mitral valve: Calcified annulus. Mild regurgitation.   - Left atrium: The atrium was moderately dilated.   - Pulmonary arteries: Systolic pressure was moderately to severely     increased. PA peak pressure: 63mm Hg (S).   - Pericardium, extracardiac: A trivial pericardial effusion was     identified posterior to the heart.   Impressions:    - Technically very limited study due to poor sound wave     transmission. LV endocardium never seen well. Anterior wall not     visualized - suspect anterior hypokinesis. EF roughly estimated at     45- 50%. Would suggets TEE to further evaluate.  CXR  Procedure date:  12/20/2009  Findings:        Findings: There is marked cardiomegaly but no pulmonary edema.  No   focal airspace disease or effusion.    IMPRESSION:   Cardiomegaly without acute disease.

## 2010-11-21 NOTE — Miscellaneous (Signed)
Summary: [facility] Cardiac Physician Order/Treatment Plan   [facility] Cardiac Physician Order/Treatment Plan   Imported By: Sallee Provencal 06/26/2010 11:37:42  _____________________________________________________________________  External Attachment:    Type:   Image     Comment:   External Document

## 2010-11-21 NOTE — Progress Notes (Signed)
Summary: REQUEST FOR DIFFERENT ABX (DUE TO SIDE EFFECTS OF MED)  Phone Note Call from Patient   Caller: Patient   [phone] Summary of Call: Pt called to adv that he is exp n/v every time he takes his med / abx  (DOXYCYCLINE).Marland Kitchen... would like to have another abx sent into [facility] to replace it..... Pt was adv that Dr Sarajane Jews is out of office but his request would be passed along.   Initial call taken by: Duanne Moron,  September 26, 2010 8:33 AM  Follow-up for Phone Call        Zithromax was prescribed.  Make sure he is taking with food. Follow-up by: Carolann Littler MD,  September 26, 2010 1:05 PM  Additional Follow-up for Phone Call Additional follow up Details #1::        Pt adv that he was prescribed zithromax (z-pak) but [facility] called [facility] to adv contraindication of current med that pts taking  (amlodipine) and z-pak.Marland KitchenMarland KitchenMarland KitchenTherefore, abx was changed to Doxycycline which pt advises is causing him to exp n/v when he takes it, even when he takes it w/ food...Marland Kitchen?  Pt advised that he has tried to take it w/ food and w/o food, causes n/v either way.  Additional Follow-up by: Duanne Moron,  September 26, 2010 1:53 PM    Additional Follow-up for Phone Call Additional follow up Details #2::    He is PCN allergic.  Levaquin 500 mg by mouth once daily for 7 days. Follow-up by: Carolann Littler MD,  September 26, 2010 2:33 PM  Additional Follow-up for Phone Call Additional follow up Details #3:: Details for Additional Follow-up Action Taken: Rx called in, pt informed on home personally identified VM Additional Follow-up by: Nira Conn LPN,  December  7, 624THL 5:19 PM  New/Updated Medications: LEVAQUIN 500 MG TABS (LEVOFLOXACIN) one tab daily X 7 days Prescriptions: LEVAQUIN 500 MG TABS (LEVOFLOXACIN) one tab daily X 7 days  #7 x 0   Entered by:   Nira Conn LPN   Authorized by:   Carolann Littler MD   Signed by:   Nira Conn LPN on [id number]  Method used:   Electronically to        [facility] (retail)       [address]       [place], [place]  [address]       Ph: [phone] or [phone]       Fax: [phone]   RxID:   [id number]

## 2010-11-21 NOTE — Medication Information (Signed)
Summary: 427.31  needs cardioversion after  Anticoagulant Therapy  Managed by: [name], PharmD Referring [name] Brown: Percival Spanish PCP: [name] Penna, [name] Brown Supervising [name] Brown: [name] [name] Brown, [name] Indication 1: Atrial Fibrillation Lab Used: [facility] [place] Site: [facility] INR POC 2.2 INR RANGE 2.0-3.0  Dietary changes: no    Health status changes: no    Bleeding/hemorrhagic complications: no    Recent/future hospitalizations: no    Any changes in medication regimen? no    Recent/future dental: no  Any missed doses?: no       Is patient compliant with meds? yes       Allergies: 1)  Penicillin V Potassium (Penicillin V Potassium)  Anticoagulation Management History:      The patient is taking warfarin and comes in today for a routine follow up visit.  Positive risk factors for bleeding include an age of 73 years or older and presence of serious comorbidities.  The bleeding index is 'intermediate risk'.  Positive CHADS2 values include History of CHF, History of HTN, and History of Diabetes.  Negative CHADS2 values include Age > 73 years old.  His last INR was 1.4 ratio.  Anticoagulation responsible provider: [name] [name] Brown, [name].  INR POC: 2.2.  Cuvette Lot#: [id number].  Exp: 08/2011.    Anticoagulation Management Assessment/Plan:      The patient's current anticoagulation dose is Coumadin 2 mg tabs: as directed.  The target INR is 2.0-3.0.  The next INR is due 08/07/2010.  Anticoagulation instructions were given to patient.  Results were reviewed/authorized by [name], PharmD.  He was notified by Griffith Citron D candidate.         Prior Anticoagulation Instructions: INR 2.0  Take Coumadin 2 mg (1 tab) on all days except Coumadin 1 mg (0.5 tab) on Mondays.  Return to clinic in 2 weeks.   Current Anticoagulation Instructions: INR 2.2 Take 1 tablet everyday. Recheck in 1 week.

## 2010-11-21 NOTE — Progress Notes (Signed)
Summary: Patient nauseated  Phone Note Call from Patient Call back at Home Phone [phone]   Caller: Patient Reason for Call: Acute Illness Summary of Call: Patient very nauseated - was seen at the ER 2 nights ago.  Wants to know if something can be called in for him. Initial call taken by: Kennon Rounds,  January 10, 2010 11:21 AM  Follow-up for Phone Call        please print out  the labs he had done in the ER Follow-up by: Laurey Morale MD,  January 10, 2010 1:51 PM  Additional Follow-up for Phone Call Additional follow up Details #1::        being seen at GI today. ER labs look ok. Additional Follow-up by: Chipper Oman, RN,  January 10, 2010 2:55 PM

## 2010-11-21 NOTE — Medication Information (Signed)
Summary: Lab Orders  Lab Orders   Imported By: Marilynne Drivers 05/28/2010 [id number]  _____________________________________________________________________  External Attachment:    Type:   Image     Comment:   External Document

## 2010-11-21 NOTE — Letter (Signed)
Summary: [facility]  [facility]   Imported By: Laural Benes 11/27/2009 11:18:55  _____________________________________________________________________  External Attachment:    Type:   Image     Comment:   External Document

## 2010-11-21 NOTE — Letter (Signed)
Summary: Barrister's clerk at [facility]. [place], [place] [address]   Phone: [phone]  Fax: [phone]    Cardioversion / TEE Cardioversion Instructions  08/29/2010 MRN: [id number]  [facility] [address] [place], [place]  [address]  Dear Mr. Puccio, You are scheduled for a Cardioversion / TEE Cardioversion on Thursday 09/06/2010 with Dr. Kirk Ruths   Please arrive at the [facility] at 12N on the day of your procedure.    1)   DIET:    Nothing to eat or drink after midnight except your medications with a sip of water.     2)   MAKE SURE YOU TAKE YOUR COUMADIN.    3)   A)   DO NOT TAKE these medications before your procedure:    Digoxin the morning of the procedure    B)   YOU MAY TAKE ALL of your remaining medications with a small amount of water     5)  Must have a responsible person to drive you home.    6)   Bring a current list of your medications and current insurance cards.   * Special Note:  Every effort is made to have your procedure done on time. Occasionally there are emergencies that present themselves at the hospital that may cause delays. Please be patient if a delay does occur.  * If you have any questions after you get home, please call the office at [phone].

## 2010-11-21 NOTE — Progress Notes (Signed)
Summary: pt needs to rsc TEE  Phone Note Call from Patient Call back at Home Phone [phone]   Caller: Patient Reason for Call: Talk to Nurse, Talk to Doctor Summary of Call: Thursday is not a good day and Monday, Wednesday, & Friday is HD days so he needs to talk to you to set up something else Initial call taken by: Shelda Pal,  August 28, 2010 4:37 PM  Follow-up for Phone Call        pt aware, will attempt TEE guided cardioversion to reschedule to Thursday 09/06/2010.  I will call him back with time and instructions Follow-up by: Sim Boast, RN,  August 28, 2010 5:47 PM

## 2010-11-21 NOTE — Assessment & Plan Note (Signed)
Summary: [facility] ([facility]   Vital Signs:  Patient profile:   75 year old male Weight:      221 pounds BMI:     29.67 O2 Sat:      90 % on Room air Temp:     98.1 degrees F oral Pulse rate:   92 / minute Pulse rhythm:   irregular BP sitting:   106 / 60  (left arm) Cuff size:   regular  Vitals Entered By: Chipper Oman, RN (May 24, 2010 10:53 AM)  O2 Flow:  Room air CC: Hosp f/u; on dialysis 3xweek.   History of Present Illness: Here to follow up multiple medical problems after a hospital stay from 03-18-10 to 04-24-10 for critical aortic stenosis, multivessel CAD, CHF, acute on chronic renal failure, pneumonia, and new onset atrial fibrillation. On 04-04-10 he had an aortic valve replacement with a tissue valve along with 3 vessel bypass surgery per Dr. Roxan Hockey. He was started on dialysis per Dr. Marval Regal, and he developed atrial fibrillation. He was sent home to get dialysis 3 days a week. He has seen Dr. Roxan Hockey and Dr. Percival Spanish. He feels fine other than generalized fatigue. No chest pain. He gets mildly SOB when walking for short distances. He is having trouble hearing out of both ears and would like to have them cleaned out. He has been told by [facility] that his kidneys are permanently damaged and that he will never be able to come off dialysis, but he would like to get a second opinion on this from a tertiary care center around this area.   Allergies: 1)  Penicillin V Potassium (Penicillin V Potassium)  Past History:  Past Medical History: Coronary artery disease, sees Dr. Percival Spanish Non- ST elevation MI   11-2009(secondary to supply demand mismatch) hx of SEVERE AORTIC STENOSIS Hyperlipidemia Hypertension Diabetes mellitus, type 2 LE edema Mild carotid artery disease, Dopplers in 2007 Gout Obesity chronic renal failure, on dialysis, sees Dr. Florene Glen Idiopathic hepatitis 11-2009 Atrial fibrillation  Past Surgical History: Bone spurs removed from  right heel Colonoscopy 05-09-06 per Dr. Ardis Hughs, repeat 3 years ADENOMATOUS Appendectomy Coronary artery bypass grafts times 3 with tissue aortic valve replacement 04-04-10 per Dr. Roxan Hockey  Review of Systems  The patient denies anorexia, fever, weight loss, weight gain, vision loss, decreased hearing, hoarseness, chest pain, syncope, peripheral edema, prolonged cough, headaches, hemoptysis, abdominal pain, melena, hematochezia, severe indigestion/heartburn, hematuria, incontinence, genital sores, muscle weakness, suspicious skin lesions, transient blindness, difficulty walking, depression, unusual weight change, abnormal bleeding, enlarged lymph nodes, angioedema, breast masses, and testicular masses.    Physical Exam  General:  Well-developed,well-nourished,in no acute distress; alert,appropriate and cooperative throughout examination Head:  Normocephalic and atraumatic without obvious abnormalities. No apparent alopecia or balding. Eyes:  No corneal or conjunctival inflammation noted. EOMI. Perrla. Funduscopic exam benign, without hemorrhages, exudates or papilledema. Vision grossly normal. Ears:  both canals full of cerumen Nose:  External nasal examination shows no deformity or inflammation. Nasal mucosa are pink and moist without lesions or exudates. Mouth:  Oral mucosa and oropharynx without lesions or exudates.  Teeth in good repair. Neck:  No deformities, masses, or tenderness noted. Lungs:  Normal respiratory effort, chest expands symmetrically. Lungs are clear to auscultation, no crackles or wheezes. Heart:  normal rate, irregularly irregular rhythm,no gallop, no rub, and no JVD.  Soft 1/6 SM over the aortic area Extremities:  1+ left pedal edema and 1+ right pedal edema.   Neurologic:  alert &  oriented X3 and gait normal.     Impression & Recommendations:  Problem # 1:  ATRIAL FIBRILLATION (ICD-427.31)  His updated medication list for this problem includes:    Aspirin 81 Mg  Tabs (Aspirin) .Marland Kitchen... Daily    Amiodarone Hcl 200 Mg Tabs (Amiodarone hcl) .Marland Kitchen... 1 by mouth two times a day    Metoprolol Succinate 50 Mg Xr24h-tab (Metoprolol succinate) .Marland Kitchen... Take one tablet by mouth every evening- hold for systolic blood pressure less than 100.    Coumadin 2 Mg Tabs (Warfarin sodium) .Marland Kitchen... As directed    Digoxin 0.125 Mg Tabs (Digoxin) .Marland Kitchen... 1 every other day  Problem # 2:  RENAL INSUFFICIENCY (ICD-588.4)  Problem # 3:  AORTIC VALVE DISORDERS (ICD-424.1)  His updated medication list for this problem includes:    Aspirin 81 Mg Tabs (Aspirin) .Marland Kitchen... Daily    Amiodarone Hcl 200 Mg Tabs (Amiodarone hcl) .Marland Kitchen... 1 by mouth two times a day    Metoprolol Succinate 50 Mg Xr24h-tab (Metoprolol succinate) .Marland Kitchen... Take one tablet by mouth every evening- hold for systolic blood pressure less than 100.    Coumadin 2 Mg Tabs (Warfarin sodium) .Marland Kitchen... As directed  Problem # 4:  CONGESTIVE HEART FAILURE (ICD-428.00)  His updated medication list for this problem includes:    Aspirin 81 Mg Tabs (Aspirin) .Marland Kitchen... Daily    Metoprolol Succinate 50 Mg Xr24h-tab (Metoprolol succinate) .Marland Kitchen... Take one tablet by mouth every evening- hold for systolic blood pressure less than 100.    Coumadin 2 Mg Tabs (Warfarin sodium) .Marland Kitchen... As directed    Digoxin 0.125 Mg Tabs (Digoxin) .Marland Kitchen... 1 every other day  Problem # 5:  HYPERTENSION (ICD-401.9)  His updated medication list for this problem includes:    Metoprolol Succinate 50 Mg Xr24h-tab (Metoprolol succinate) .Marland Kitchen... Take one tablet by mouth every evening- hold for systolic blood pressure less than 100.  Complete Medication List: 1)  Aspirin 81 Mg Tabs (Aspirin) .... Daily 2)  Flonase 50 Mcg/act Susp (Fluticasone propionate) .... 2 sprays each nostril q day 3)  Lantus 100 Unit/ml Soln (Insulin glargine) .... 20 units at bedtime 4)  Colcrys 0.6 Mg Tabs (Colchicine) .... Take as needed for gout 5)  Tylenol 325 Mg Tabs (Acetaminophen) .... As needed 6)   Amiodarone Hcl 200 Mg Tabs (Amiodarone hcl) .Marland Kitchen.. 1 by mouth two times a day 7)  Debrox 6.5 % Soln (Carbamide peroxide) .... As directed 8)  Santyl 250 Unit/gm Oint (Collagenase) .... As directed 9)  Aranesp (albumin Free) 200 Mcg/0.36ml Soln (Darbepoetin alfa-polysorbate) .... As directed 10)  Venofer 20 Mg/ml Soln (Iron sucrose) .... As directed 11)  Benadryl 25 Mg Caps (Diphenhydramine hcl) .... As needed 12)  Guaifenesin 200 Mg Tabs (Guaifenesin) .... As directed 13)  Tussin 100 Mg/52ml Syrp (Guaifenesin) .... As needed 14)  Levothroid 25 Mcg Tabs (Levothyroxine sodium) .Marland Kitchen.. 1 by mouth daily 15)  Metoprolol Succinate 50 Mg Xr24h-tab (Metoprolol succinate) .... Take one tablet by mouth every evening- hold for systolic blood pressure less than 100. 16)  Nephro-vite Rx 1 Mg Tabs (B complex-c-folic acid) .... As directed 17)  Protonix 40 Mg Tbec (Pantoprazole sodium) .Marland Kitchen.. 1 by mouth daily 18)  Zemplar 4 Mcg Caps (Paricalcitol) .... As directed m-w-f 19)  Sarna 0.5-0.5 % Lotn (Camphor-menthol) .... Daily 20)  Sorbitol 3 % Soln (Sorbitol) .... As directed 21)  Ultram 50 Mg Tabs (Tramadol hcl) .... As needed 22)  Coumadin 2 Mg Tabs (Warfarin sodium) .... As directed 23)  Miralax Powd (  Polyethylene glycol 3350) .... As directed 24)  Allopurinol 100 Mg Tabs (Allopurinol) .Marland Kitchen.. 1 once daily 25)  Digoxin 0.125 Mg Tabs (Digoxin) .Marland Kitchen.. 1 every other day 26)  Simvastatin 20 Mg Tabs (Simvastatin) .... Once daily 27)  Alprazolam 0.5 Mg Tabs (Alprazolam) .Marland Kitchen.. 1 q6h as needed 28)  Oxycodone Hcl 5 Mg Tabs (Oxycodone hcl) .Marland Kitchen.. 1 q4h as needed 29)  Folic Acid 1 Mg Tabs (Folic acid) .... Once daily  Other Orders: Nephrology Referral (Nephro)  Patient Instructions: 1)  Continue on current meds. We will arrange for him to meet with a nephrologist at [facility] for a second opinion.    Immunization History:  Hepatitis B Immunization History:    Hepatitis B # 1:  hepb adult (05/21/2010)  Pneumovax  Immunization History:    Pneumovax:  pneumovax (medicare) (05/23/2010)

## 2010-11-21 NOTE — Procedures (Signed)
Summary: Colon   Colonoscopy  Procedure date:  05/09/2006  Findings:      Location:  [facility].   Patient Name: [name] Brown, [name] Brown MRN: [id number] Procedure Procedures: Colonoscopy CPT: H7044205.    with polypectomy. CPT: S2983155.  Personnel: Endoscopist: Milus Banister, MD.  Exam Location: Exam performed in Endoscopy Suite. Outpatient  Patient Consent: Procedure, Alternatives, Risks and Benefits discussed, consent obtained, from patient. Consent was obtained by the RN.  Indications  Surveillance of: Adenomatous Polyp(s).  Increased Risk Screening: For family history of colorectal neoplasia, in   History  Current Medications: Patient is not currently taking Coumadin.  Comments: Patient history reviewed and updated, pre-procedure physical performed prior to initiation of sedation? yes Pre-Exam Physical: Performed May 09, 2006. Cardio-pulmonary exam, Cardio-pulmonary exam, Abdominal exam, Abdominal exam, Mental status exam, Mental status exam WNL.  Exam Exam: Extent of exam reached: Cecum, extent intended: Cecum.  The cecum was identified by appendiceal orifice and IC valve. Patient position: on left side. Time for Withdrawl: 00:16:42. Colon retroflexion performed. Images taken. ASA Classification: II. Tolerance: good.  Monitoring: Pulse and BP monitoring, Oximetry used. Supplemental O2 given.  Colon Prep Prep results: good.  Sedation Meds: Patient assessed and found to be appropriate for moderate (conscious) sedation. Fentanyl 75 mcg. given IV. Versed 7 mg. given IV.  Findings POLYP: Hepatic Flexure, Maximum size: 3 mm. sessile polyp. Procedure:  snare without cautery, removed, retrieved, Polyp sent to pathology. Polyp sent to pathology. ICD9: Colon Polyps: 211.3.  POLYP: Hepatic Flexure, Maximum size: 7 mm. sessile polyp. Procedure:  snare without cautery, removed, retrieved, sent to pathology. sent to pathology. ICD9: Colon Polyps: 211.3.  POLYP:  Descending Colon, Maximum size: 4 mm. sessile polyp. Procedure:  snare without cautery, removed, retrieved, sent to pathology. sent to pathology. ICD9: Colon Polyps: 211.3.  - DIVERTICULOSIS: Descending Colon to Sigmoid Colon. Comments: mild.  POLYP: Cecum, Maximum size: 4 mm. sessile polyp. Procedure:  snare without cautery, removed, retrieved, sent to pathology. sent to pathology. ICD9: Colon Polyps: 211.3.  - NORMAL EXAM: Cecum to Rectum. Comments: otherwise normal examination.   Assessment Abnormal examination, see findings above.  Diagnoses: 211.3: Colon Polyps.   Comments: 4 colon polyps, no cancers. Events  Unplanned Interventions: No intervention was required.  Unplanned Events: There were no complications. Plans Comments: Await pathology, he will likely need repeat colonscopy in 3 years. Scheduling/Referral: Await pathology to schedule patient.  This report was created from the original endoscopy report, which was reviewed and signed by the above listed endoscopist.

## 2010-11-21 NOTE — Assessment & Plan Note (Signed)
Summary: post nasal drainage/dm   Vital Signs:  Patient profile:   75 year old male Weight:      265 pounds O2 Sat:      86 % on Room air Temp:     97.7 degrees F oral Pulse rate:   75 / minute BP sitting:   104 / 84  (left arm) Cuff size:   large  Vitals Entered By: Townsend Roger, CMA (December 13, 2009 9:43 AM)  O2 Flow:  Room air  Serial Vital Signs/Assessments:                                PEF    PreRx  PostRx Time      O2 Sat  O2 Type     L/min  L/min  L/min   By           99  %   3 L/min                           Townsend Roger, CMA 10:22 AM  97  %   Room air                          [facility], CMA  CC: SOB, sinus pressure Comments pt was placed on 3L of oxygen  Townsend Roger, CMA  December 13, 2009 9:43 AM   History of Present Illness: Here for the sudden onset 2 days ago of weakness, chills, mild aches, coughing up brown sputum, SOB, sinus congestion, diarrhea, and vomitting. No appetite but he has been drinking water. This am he was even weaker and more SOB. He has vomitted 3 times this am. He drove himself here to the clinic.   Allergies: 1)  Amoxicillin (Amoxicillin) 2)  Penicillin V Potassium (Penicillin V Potassium)  Past History:  Past Medical History: Reviewed history from 11/02/2009 and no changes required. Coronary artery disease (non obstructive) Hyperlipidemia Hypertension Diabetes mellitus, type 2 LE edema Mild carotid artery disease, Dopplers in 2007 Gout Obesity Aortic Stenosis Renal insufficiency, sees Dr. Florene Glen  Past Surgical History: Reviewed history from 06/06/2009 and no changes required. Bone spurs removed from right heel Colonoscopy 05-09-06 per Dr. Ardis Hughs, repeat 3 years Appendectomy  Review of Systems  The patient denies anorexia, fever, weight loss, weight gain, vision loss, decreased hearing, hoarseness, chest pain, syncope, peripheral edema, headaches, hemoptysis, abdominal pain, melena, hematochezia, severe  indigestion/heartburn, hematuria, incontinence, genital sores, muscle weakness, suspicious skin lesions, transient blindness, difficulty walking, depression, unusual weight change, abnormal bleeding, enlarged lymph nodes, angioedema, breast masses, and testicular masses.    Physical Exam  General:  appears ill, weak. Vomitted twice in our exam room with clear or yellowish vomitus.  Head:  Normocephalic and atraumatic without obvious abnormalities. No apparent alopecia or balding. Eyes:  No corneal or conjunctival inflammation noted. EOMI. Perrla. Funduscopic exam benign, without hemorrhages, exudates or papilledema. Vision grossly normal. Ears:  External ear exam shows no significant lesions or deformities.  Otoscopic examination reveals clear canals, tympanic membranes are intact bilaterally without bulging, retraction, inflammation or discharge. Hearing is grossly normal bilaterally. Nose:  External nasal examination shows no deformity or inflammation. Nasal mucosa are pink and moist without lesions or exudates. Mouth:  Oral mucosa and oropharynx without lesions or exudates.  Teeth in good repair. Neck:  No deformities, masses, or tenderness noted. Lungs:  Normal respiratory effort, chest expands symmetrically. Lungs are clear to auscultation, no crackles or wheezes. Heart:  Normal rate and regular rhythm. S1 and S2 normal without gallop, click, rub or other extra sounds. has his usual 2/6 SM.  Abdomen:  Bowel sounds positive,abdomen soft and non-tender without masses, organomegaly or hernias noted.   Impression & Recommendations:  Problem # 1:  VIRAL INFECTION (ICD-079.99)  His updated medication list for this problem includes:    Aspirin 81 Mg Tabs (Aspirin) .Marland Kitchen... Daily  Orders: Nebulizer Tx [phone]) Rocephin  250mg  [id number]) Admin of Therapeutic Inj  intramuscular or subcutaneous [id number])  Complete Medication List: 1)  Furosemide 40 Mg Tabs (Furosemide) .... 2 in am and 1 in pm 2)   Glyburide-metformin 5-500 Mg Tabs (Glyburide-metformin) .... Two  tabs  two times a day 3)  Ambien 10 Mg Tabs (Zolpidem tartrate) .... One at bedtime 4)  Lipitor 20 Mg Tabs (Atorvastatin calcium) .... Take 1 tab by mouth daily 5)  Ultravate 0.05 % Oint (Halobetasol propionate) .... Apply two times a day as needed rash 6)  Allopurinol 300 Mg Tabs (Allopurinol) .... Once daily 7)  Bystolic 20 Mg Tabs (Nebivolol hcl) .Marland Kitchen.. 1 by mouth daily 8)  Tricor 145 Mg Tabs (Fenofibrate) .Marland Kitchen.. 1 by mouth once daily 9)  Aspirin 81 Mg Tabs (Aspirin) .... Daily 10)  Vicodin 5-500 Mg Tabs (Hydrocodone-acetaminophen) .... One by mouth 6 hours as needed 11)  Folic Acid 1 Mg Tabs (Folic acid) .... Once daily 12)  Flonase 50 Mcg/act Susp (Fluticasone propionate) .... 2 sprays each nostril q day 13)  Januvia 100 Mg Tabs (Sitagliptin phosphate) .... Once daily 14)  Lantus Solostar 100 Unit/ml Soln (Insulin glargine) .... Use 30 units at bedtime 15)  Zithromax Z-pak 250 Mg Tabs (Azithromycin) .... As directed 16)  Promethazine Hcl 25 Mg Tabs (Promethazine hcl) .Marland Kitchen.. 1 q 4 hours as needed nausea 17)  Proair Hfa 108 (90 Base) Mcg/act Aers (Albuterol sulfate) .... 2 puffs q 4 hours as needed sob  Patient Instructions: 1)  This is consistent with acute influenza. We gave him a nebulizer treatment, and his RA O2 sats went up to 97% and he felt better. He looked better, and on exam he was moving more air in and out of the lungs. We will treat with a shot of Rocephin and begin a Zpack today. use an albuterol inhaler as needed as well as Phenergan as needed to control the nausea. He will go home and drink as much water as he can. We will see him back again tomorrow. he will call us if he gets any worse.  Prescriptions: PROMETHAZINE HCL 25 MG TABS (PROMETHAZINE HCL) 1 q 4 hours as needed nausea  #30 x 2   Entered and Authorized by:   Laurey Morale MD   Signed by:   Laurey Morale MD on 12/13/2009   Method used:   Electronically to          [facility] [phone]* (retail)       [place], [place]  [address]       Ph: [phone] or [phone]       Fax: [phone]   RxID:   [id number] ZITHROMAX Z-PAK 250 MG TABS (AZITHROMYCIN) as directed  #1 x 0   Entered and Authorized by:   Laurey Morale MD   Signed by:   Laurey Morale MD on 12/13/2009   Method used:  Electronically to        [facility] (retail)       [address]       [place], [place]  [address]       Ph: [phone] or [phone]       Fax: [phone]   RxID:   [id number]    Medication Administration  Injection # 1:    Medication: Rocephin  250mg     Diagnosis: VIRAL INFECTION (ICD-079.99)    Route: IM    Site: LUOQ gluteus    Exp Date: 06/2012    Lot #: [id number]    Mfr: Novaplus    Comments: 1g    Patient tolerated injection without complications    Given by: Townsend Roger, [place] (December 13, 2009 2:52 PM)  Orders Added: 1)  Est. Patient Level IV [id number] 2)  Nebulizer Tx [id number] 3)  Rocephin  250mg  [[id number]] 4)  Admin of Therapeutic Inj  intramuscular or subcutaneous [id number]

## 2010-11-21 NOTE — Progress Notes (Signed)
Summary: swelling  Phone Note Call from Patient Call back at Home Phone [phone]   Caller: Patient Call For: Dr. Ardis Hughs Reason for Call: Talk to Nurse Summary of Call: past 2 weeks, pt reports gaining a lot of fluid buil-up in legs and abd... would like Dr. Ardis Hughs to add another test to upcoming procedure that would test kidney function Initial call taken by: Lucien Mons,  Mar 12, 2010 1:43 PM  Follow-up for Phone Call        Has gained 10 lbs.over the past 2 weeks .Repeat labs scheduled for next Tuesday -03/20/10.Does he need to come in earlier? Follow-up by: Abel Presto RN,  Mar 12, 2010 2:32 PM  Additional Follow-up for Phone Call Additional follow up Details #1::        not from a "liver" perspective.  I don't believe he has cirrhosis.  This is probably from CHF, aortic valvular disease or chronic renal disease.  He need to get in touch with Dr. Percival Spanish. Additional Follow-up by: Milus Banister MD,  Mar 12, 2010 2:48 PM    Additional Follow-up for Phone Call Additional follow up Details #2::    Per my prior suggestion pt. has all ready contacted Dr.Hochrein's office and the labs they need for pre-cath are all included in what Dr.Jacobs has ordered. Follow-up by: Abel Presto RN,  Mar 12, 2010 2:55 PM  23

## 2010-11-21 NOTE — Progress Notes (Signed)
Summary: catheter removal scheduled  Phone Note Call from Patient Call back at Home Phone [phone]   Caller: Patient Summary of Call: Pt can't be at the hospital on 09/27/10 but can 09/25/10 or 10/02/10 or 10/04/10 or 10/09/10 pt said you can leave mess about with day you put in for him and pt want to know when his coumadin need to be stopped and pt said that his other procedure need to be changed for his heart being shocked. Initial call taken by: Delsa Sale,  September 21, 2010 8:25 AM  Follow-up for Phone Call        Spoke with [name] Brown in coumadin clinic - will attempt to set pt up for 10/04/10 with Interventional radiology for the removal of right internal jugular hemodialysis catheter.  [name] Ripper, RN  Pt scheduled for removal of right internal jugular hemodialysis catheter on 10/04/2010 at 7:30am.  He needs to report to 1st floor pt admitting and be in radiology by 7am, be NPO after midnight and bring a medication list with him.  We can not schedule cardioversion now until he has been therapeutic for 4 weeks. Follow-up by: Sim Boast, RN,  September 21, 2010 9:41 AM     Appended Document: catheter removal scheduled Spoke with pt.  He will take last dose of Coumadin on 12/10.  No Coumadin on 12/11 and 12/12.  Appt made to check INR on 12/13.  Will dose Lovenox at 1mg /kg/day.  Pt's dry weight- 87kg.   Clinical Lists Changes  Medications: Added new medication of LOVENOX 80 MG/0.8ML SOLN (ENOXAPARIN SODIUM) Inject 1 syringe subcutaneously daily as directed - Signed Rx of LOVENOX 80 MG/0.8ML SOLN (ENOXAPARIN SODIUM) Inject 1 syringe subcutaneously daily as directed;  #8 x 1;  Signed;  Entered by: [name] PharmD;  Authorized by: Minus Breeding, MD, [facility];  Method used: Electronically to [facility] [phone]*, [address] [place], [place]  [address], Ph: [phone] or [phone], Fax: [phone]    Prescriptions: LOVENOX 80 MG/0.8ML SOLN (ENOXAPARIN SODIUM)  Inject 1 syringe subcutaneously daily as directed  #8 x 1   Entered by:   [name] PharmD   Authorized by:   Minus Breeding, MD, [facility]   Signed by:   [name] PharmD on 09/24/2010   Method used:   Electronically to        [facility] [phone]* (retail)       [address]       [place], [place]  [address]       Ph: [phone] or [phone]       Fax: [phone]   RxID:   [id number]

## 2010-11-21 NOTE — Medication Information (Signed)
Summary: Lab Orders  Lab Orders   Imported By: Marilynne Drivers 06/07/2010 10:05:26  _____________________________________________________________________  External Attachment:    Type:   Image     Comment:   External Document

## 2010-11-21 NOTE — Progress Notes (Signed)
  Phone Note Call from Patient   Caller: Patient Reason for Call: Acute Illness Details for Reason: Mod. Epitaxis on Coumadin Summary of Call: Pt. had Mod. Epitaxis without provocation that resolved quickly after self nasal pack in setting of higher than normal BP at 155/89 but with low therapeutic INR of 2.1 (checked same day by home health RN).  Pt. is concerned about BP and Coumadin after this episode of bleeding which is very atypical for pt.  I told him we will need to continue to monitor his symptoms, BP, and INR closely but as of now it would be unwise to change any of his meds.  He agreed and will call with any further bleeding. Initial call taken by: Kerby Nora

## 2010-11-21 NOTE — Miscellaneous (Signed)
Summary: [facility] Progress Report  [facility] Progress Report   Imported By: Sallee Provencal 09/28/2010 14:57:58  _____________________________________________________________________  External Attachment:    Type:   Image     Comment:   External Document

## 2010-11-21 NOTE — Progress Notes (Signed)
Summary: Records Request  Faxed three most recent OV's to [facility] at [facility] ([id number]). Ranell Patrick  August 07, 2010 3:47 PM

## 2010-11-21 NOTE — Medication Information (Signed)
Summary: rov/eac  Anticoagulant Therapy  Managed by: [name], PharmD Referring MD: Percival Spanish PCP: Alysia Penna, MD Supervising MD: Harrington Challenger MD, Nevin Bloodgood Indication 1: Atrial Fibrillation Lab Used: [facility] Site: [place] INR POC 2.5 INR RANGE 2.0-3.0  Dietary changes: no    Health status changes: yes       Details: pending percutaneous cath removal before TEE/DCCV.  No appt set yet.  Pt has appt with Dr. Oneida Alar on Thursday.  He wants to make sure fistula is fully functioning before he has the catheter removed.   Bleeding/hemorrhagic complications: no    Recent/future hospitalizations: no    Any changes in medication regimen? no    Recent/future dental: no  Any missed doses?: yes     Details: Held Saturday and Sunday per instructions for possible catheter removal on Monday.    Comments: Spoke with Bethena Roys at Dr. Nona Dell office.  Pt is not scheduled for removal at this time.  Per their office notes, IR placed cath originially so they will need to remove it.  Bethena Roys will f/u after Dr. Oneida Alar appt on Thursday to make sure pt fistula is functioning properly and to let us know when he is scheduled to have other catheter removed.  LMOM for pt with information. [name] PharmD  September 17, 2010 11:16 AM   Allergies: 1)  Penicillin V Potassium (Penicillin V Potassium)  Anticoagulation Management History:      The patient is taking warfarin and comes in today for a routine follow up visit.  Positive risk factors for bleeding include an age of 73 years or older and presence of serious comorbidities.  The bleeding index is 'intermediate risk'.  Positive CHADS2 values include History of CHF, History of HTN, and History of Diabetes.  Negative CHADS2 values include Age > 73 years old.  His last INR was 1.4 ratio.  Anticoagulation responsible provider: Harrington Challenger MD, Nevin Bloodgood.  INR POC: 2.5.  Cuvette Lot#: [id number].  Exp: 09/2011.    Anticoagulation Management Assessment/Plan:   The patient's current anticoagulation dose is Coumadin 2 mg tabs: as directed.  The target INR is 2.0-3.0.  The next INR is due 09/24/2010.  Anticoagulation instructions were given to patient.  Results were reviewed/authorized by [name], PharmD.  He was notified by [name] PharmD.         Prior Anticoagulation Instructions: INR 3.1 Continue 2mg s everyday except 3mg s on Mondays, Wednesdays and Fridays. Recheck in one week.   Current Anticoagulation Instructions: INR 2.5  Decrease dose to 1 tablet every day except 1 1/2 tablets on Monday and Friday.  Recheck INR in 1-2 weeks.

## 2010-11-21 NOTE — Consult Note (Signed)
Summary: [facility]  [facility]   Imported By: Laural Benes 07/13/2010 15:36:23  _____________________________________________________________________  External Attachment:    Type:   Image     Comment:   External Document

## 2010-11-21 NOTE — Progress Notes (Signed)
Summary: lab reminder  Phone Note Outgoing Call Call back at Home Phone [phone]   Call placed by: Christian Mate CMA Deborra Medina),  Mar 20, 2010 3:50 PM Summary of Call: called to remind pt to have labs done Initial call taken by: Christian Mate CMA Deborra Medina),  Mar 20, 2010 3:50 PM

## 2010-11-21 NOTE — Letter (Signed)
Summary: Discharge Instructions, Medications/[facility]  Discharge Instructions, Medications/[facility]   Imported By: Laural Benes 05/28/2010 14:45:47  _____________________________________________________________________  External Attachment:    Type:   Image     Comment:   External Document

## 2010-11-21 NOTE — Progress Notes (Signed)
Summary: REQUEST FOR RX REPLACEMENT  Phone Note From Pharmacy   Caller: [facility]  Chanda Busing [phone]* Summary of Call: [place] called to adv that pt was prescribed Zithromax (z-pak)  but is taking Amiodorone as well....[facility] Jenny Reichmann) adv can't take both at the same time...Marland Kitchen?  Needs to have replacement Rx sent into [facility].  Initial call taken by: Duanne Moron,  September 24, 2010 4:35 PM  Follow-up for Phone Call        Pt called to check on status of new antibiotic. Pt wants to be sure that this is called in today.  Follow-up by: Braulio Bosch,  September 24, 2010 4:54 PM  Additional Follow-up for Phone Call Additional follow up Details #1::        agreed. Instead of Zpack, call in Doxycycline 100 mg two times a day for 10 days  Additional Follow-up by: Laurey Morale MD,  September 24, 2010 5:16 PM    New/Updated Medications: DOXYCYCLINE HYCLATE 100 MG TABS (DOXYCYCLINE HYCLATE) 1 by mouth two times a day for 10 days Prescriptions: DOXYCYCLINE HYCLATE 100 MG TABS (DOXYCYCLINE HYCLATE) 1 by mouth two times a day for 10 days  #20 x 0   Entered by:   Levora Angel, RN   Authorized by:   Laurey Morale MD   Signed by:   Levora Angel, RN on 09/24/2010   Method used:   Telephoned to ...       [facility] [address]* (retail)       [address]       [place], [place]  [address]       Ph: [phone] or [phone]       Fax: [phone]   RxID:   [id number]

## 2010-11-21 NOTE — Medication Information (Signed)
Summary: rov/tm  Anticoagulant Therapy  Managed by: Gypsy Lore, PharmD Referring MD: Percival Spanish PCP: Alysia Penna, MD Supervising MD: Rayann Heman MD, Jeneen Rinks Indication 1: Atrial Fibrillation Lab Used: [facility] [place] Site: [place] INR POC 2.0 INR RANGE 2.0-3.0  Dietary changes: no    Health status changes: no    Bleeding/hemorrhagic complications: yes       Details: Nosebleed yesterday,   Recent/future hospitalizations: no    Any changes in medication regimen? yes       Details: Started colchicine yesterday for gout. Started fish oil.   Recent/future dental: no  Any missed doses?: no       Is patient compliant with meds? yes      Comments: Received a 1 mg boost earlier this week due to subtherapeutic INR at dialysis.   Current Medications (verified): 1)  Flonase 50 Mcg/act Susp (Fluticasone Propionate) .... 2 Sprays Each Nostril Q Day 2)  Lantus 100 Unit/ml Soln (Insulin Glargine) .... 20 Units At Bedtime 3)  Colcrys 0.6 Mg Tabs (Colchicine) .... Take As Needed For Gout 4)  Tylenol 325 Mg Tabs (Acetaminophen) .... As Needed 5)  Amiodarone Hcl 200 Mg Tabs (Amiodarone Hcl) .Marland Kitchen.. 1 Tablet By Mouth Once A Day 6)  Levothroid 25 Mcg Tabs (Levothyroxine Sodium) .Marland Kitchen.. 1 By Mouth Daily 7)  Metoprolol Succinate 50 Mg Xr24h-Tab (Metoprolol Succinate) .... Take One Tablet By Mouth Every Evening- Hold For Systolic Blood Pressure Less Than 100. 8)  Vol-Care Rx 1 Mg Tabs (B Complex-C-Folic Acid) .... Take 1 Tablet By Mouth Once A Day 9)  Ultram 50 Mg Tabs (Tramadol Hcl) .... As Needed 10)  Coumadin 2 Mg Tabs (Warfarin Sodium) .... As Directed 11)  Allopurinol 300 Mg Tabs (Allopurinol) .Marland Kitchen.. 1 Once Daily 12)  Digoxin 0.125 Mg Tabs (Digoxin) .Marland Kitchen.. 1 Every Other Day 13)  Simvastatin 20 Mg Tabs (Simvastatin) .... Once Daily 14)  Alprazolam 0.5 Mg Tabs (Alprazolam) .Marland Kitchen.. 1 Q6h As Needed 15)  Oxycodone Hcl 5 Mg Tabs (Oxycodone Hcl) .Marland Kitchen.. 1 Q4h As Needed 16)  Miralax  Powd  (Polyethylene Glycol 3350) .... Once Daily 17)  Onetouch Test  Strp (Glucose Blood) .... Use With Glucose Monitor Three Times A Day 18)  Nepro  Liqd 8 Oz (Nutritional Supplements) .... Once Daily 19)  Diphenhydramine Hcl 25 Mg Caps (Diphenhydramine Hcl) .... As Needed For Sleep 20)  Promethazine Hcl 25 Mg/ml Soln (Promethazine Hcl) .... 1/2 To 1 As Needed For [place] 21)  Fish Oil 1000 Mg Caps (Omega-3 Fatty Acids) .... Take 1 Cap By Mouth Daily.  Allergies (verified): 1)  Penicillin V Potassium (Penicillin V Potassium)  Anticoagulation Management History:      The patient is taking warfarin and comes in today for a routine follow up visit.  Positive risk factors for bleeding include an age of 81 years or older and presence of serious comorbidities.  The bleeding index is 'intermediate risk'.  Positive CHADS2 values include History of CHF, History of HTN, and History of Diabetes.  Negative CHADS2 values include Age > 30 years old.  His last INR was 1.4 ratio.  Anticoagulation responsible provider: Allred MD, Jeneen Rinks.  INR POC: 2.0.  Cuvette Lot#: [id number].  Exp: 08/2011.    Anticoagulation Management Assessment/Plan:      The patient's current anticoagulation dose is Coumadin 2 mg tabs: as directed.  The target INR is 2.0-3.0.  The next INR is due 08/07/2010.  Anticoagulation instructions were given to patient.  Results were reviewed/authorized by  Gypsy Lore, PharmD.  He was notified by Gypsy Lore PharmD.         Prior Anticoagulation Instructions: INR 1.9 Today take extra 1/2  tablet then resume 1 tablet everyday except 1/2 tablet on Mondays and Fridays. Recheck in 2 weeks.   Current Anticoagulation Instructions: INR 2.0  Take Coumadin 2 mg (1 tab) on all days except Coumadin 1 mg (0.5 tab) on Mondays.  Return to clinic in 2 weeks.

## 2010-11-21 NOTE — Letter (Signed)
Summary: Cardiac Catheterization Instructions- [place], [place]  [id number] [place]   [place], [place] [address]   Phone: [phone]  Fax: [phone]     02/26/2010 MRN: [id number]  [facility] [place], [place]  [address]  Dear Mr. Goodpasture,   You are scheduled for a Cardiac Catheterization on 03/22/210 with Dr. Percival Spanish Please arrive to the 1st floor of the Heart and Vascular Center at [facility] at 6:30 am on the day of your procedure. Please do not arrive before 6:30 a.m. Call the Heart and Vascular Center at [phone] if you are unable to make your appointmnet. The Code to get into the parking garage under the building is 0100. Take the elevators to the 1st floor. You must have someone to drive you home. Someone must be with you for the first 24 hours after you arrive home. Please wear clothes that are easy to get on and off and wear slip-on shoes. Do not eat or drink after midnight except water with your medications that morning. Bring all your medications and current insurance cards with you.  ___ DO NOT take these medications before your procedure: Furosemide the am and 1/2 normal dose of Lantus the night before ___ Please come for your blood work Tuesday, Mar 20, 2010 ___ Make sure you take your aspirin.  ___ You may take ALL of your medications with water that morning.  The usual length of stay after your procedure is 2 to 3 hours. This can vary.  If you have any questions, please call the office at the number listed above.   Sim Boast, RN for  Dr. Percival Spanish

## 2010-11-21 NOTE — Progress Notes (Signed)
Summary: regarding lab work  Phone Note Call from Patient Call back at [facility] [phone]   Caller: Patient Reason for Call: Talk to Nurse Summary of Call: pt scheduled for heart cath on 03/22/10 . question regarding lab work  Initial call taken by: Neil Crouch,  Mar 07, 2010 9:44 AM  Follow-up for Phone Call        I spoke with the pt and he is scheduled for labwork with Dr Ardis Hughs and he wanted to get his pre-cath labwork drawn at the [facility].  The pt's lab appt is scheduled on 03/19/10 with Dr Ardis Hughs and I made the pt aware that [facility] will be closed on that day and he would need to have his labs drawn on 03/20/10. Order was placed in the system for labwork.  Follow-up by: Theodosia Quay, RN, BSN,  Mar 07, 2010 10:15 AM

## 2010-11-21 NOTE — Medication Information (Signed)
Summary: Coumadin Clinic  Anticoagulant Therapy  Managed by: Freddrick March, RN, BSN Referring MD: Percival Spanish PCP: Alysia Penna, MD Supervising MD: Lia Foyer MD, Marcello Moores Indication 1: Atrial Fibrillation Lab Used: [facility] [place] Site: [place] PT 42.7 INR POC 4.3 INR RANGE 2.0-3.0          Comments: INR 2.3 on 8/27 at hospital d/c.  Pt very concerned states when nurse checked yesterday 1st INR was 5.7, 10 mins later 2nd INR was 4.3.  Pt is going to come into office for PT/INR, unsure of accuracy of [employer]'s Coag machine.  Called Sheryl at [facility] to report the discrepency.   Allergies: 1)  Penicillin V Potassium (Penicillin V Potassium)  Anticoagulation Management History:      His anticoagulation is being managed by telephone today.  Positive risk factors for bleeding include an age of 24 years or older and presence of serious comorbidities.  The bleeding index is 'intermediate risk'.  Positive CHADS2 values include History of CHF, History of HTN, and History of Diabetes.  Negative CHADS2 values include Age > 65 years old.  His last INR was 1.4 ratio.  Prothrombin time is 42.7.  Anticoagulation responsible provider: Lia Foyer MD, Marcello Moores.  INR POC: 4.3.    Anticoagulation Management Assessment/Plan:      The patient's current anticoagulation dose is Coumadin 2 mg tabs: as directed.  The target INR is 2.0-3.0.  The next INR is due 06/15/2010.  Anticoagulation instructions were given to patient.  Results were reviewed/authorized by Freddrick March, RN, BSN.         Prior Anticoagulation Instructions: INR 4.5  Called spoke with pt's caregiver, advised to hold x 2 doses, then decr dosage to 1 tablet daily except 1/2 tablet on Mondays, Wednesdays, and Fridays.  Recheck in 1 week.    Current Anticoagulation Instructions: INR 4.3  Attempted to contact pt, LMOM TCB. Freddrick March RN  June 18, 2010 2:11 PM   [facility] for pt.  Instructed to hold Coumadin today and will  f/u in AM. [name] PharmD  June 18, 2010 4:29 PM   Spoke with pt and made appt for today in office due to INR discrepancy from [employer]'s readings. Tula Nakayama, RN, BSN  June 19, 2010 4:53 PM

## 2010-11-21 NOTE — Progress Notes (Signed)
Summary: triage  Phone Note Call from Patient Call back at [phone]   Caller: caregiver [name] Call For: [name] Brown Reason for Call: Talk to Nurse Summary of Call: [name] (pt's caregiver) would ilke patient seen sooner than first available appt 4-29 do to severe nausea and stomach pain, pt has been to the ER twice and feel the same. Initial call taken by: Ronalee Red,  January 10, 2010 9:27 AM  Follow-up for Phone Call        pt has continued nausea, bloating and very uncomfortable.  No fever, diarrhea.  Pt is scheduled  for 01/30/10.  No confusion.  Was in the ER on 01/08/10.  Pt is to f/u with Dr Sarajane Jews this week.  Is it ok for the pt to wait until 4/12 for appt or should he be worked in with the extender? Christian Mate CMA Deborra Medina)  January 10, 2010 9:56 AM   Additional Follow-up for Phone Call Additional follow up Details #1::        i see an opening in my office afternoon april 4th, he can have that or if he feels he must be seen sooner then will need PA visit Additional Follow-up by: Milus Banister MD,  January 10, 2010 10:03 AM    Additional Follow-up for Phone Call Additional follow up Details #2::    that is not an available slot in [facility], it only looks like it in EMR do you want me to use  those spots anyway in the future?  The pt wanted to be seen today with Amy he was scheduled for 2 pm. Christian Mate CMA Deborra Medina)  January 10, 2010 10:14 AM   Additional Follow-up for Phone Call Additional follow up Details #3:: Details for Additional Follow-up Action Taken: ok for PA visit today. Yes, want to try to keep 10 spots in AM and PM sessions. Additional Follow-up by: Milus Banister MD,  January 10, 2010 10:22 AM

## 2010-11-21 NOTE — Miscellaneous (Signed)
Summary: Certification and Plan of Care/[facility]  Certification and Plan of [place] By: Laural Benes 01/09/2010 10:28:12  _____________________________________________________________________  External Attachment:    Type:   Image     Comment:   External Document

## 2010-11-21 NOTE — Progress Notes (Signed)
Summary: CXR order  Phone Note Other Incoming   Caller: Lexine Baton  PA Details for Reason: needs CXR Summary of Call: Per Lexine Baton PA - pt needs a CXR for SOB  New Problems: SHORTNESS OF BREATH (ICD-786.05)   New Problems: SHORTNESS OF BREATH (ICD-786.05)

## 2010-11-21 NOTE — Miscellaneous (Signed)
Summary: [facility] Cardiac Progress Report   [facility] Cardiac Progress Report   Imported By: Sallee Provencal 09/10/2010 15:01:25  _____________________________________________________________________  External Attachment:    Type:   Image     Comment:   External Document

## 2010-11-21 NOTE — Progress Notes (Signed)
Summary: fluid  Phone Note Call from Patient Call back at [facility] Phone [phone] Call back at [phone]   Caller: vm Summary of Call: A lot of fluid in body.  Have gained another 7 - 10 lbs since there.  Can I increase furosemide?   Initial call taken by: Shelbie Hutching, RN,  Mar 16, 2010 3:06 PM  Follow-up for Phone Call        yes increase this to 2 tabs twice a day. He needs to let Dr. Percival Spanish know Follow-up by: Laurey Morale MD,  Mar 16, 2010 3:14 PM  Additional Follow-up for Phone Call Additional follow up Details #1::        Phone Call Completed Additional Follow-up by: Chipper Oman, RN,  Mar 16, 2010 3:22 PM

## 2010-11-21 NOTE — Letter (Signed)
Summary: [facility]   Imported By: Marilynne Drivers 09/21/2010 17:20:43  _____________________________________________________________________  External Attachment:    Type:   Image     Comment:   External Document

## 2010-11-21 NOTE — Medication Information (Signed)
Summary: rov/tm  Anticoagulant Therapy  Managed by: Freddrick March, RN, BSN Referring MD: Percival Spanish PCP: Alysia Penna, MD Supervising MD: [name] MD, [name] Indication 1: Atrial Fibrillation Lab Used: [facility] [place] Site: [place] INR POC 3.1 INR RANGE 2.0-3.0  Dietary changes: no    Health status changes: no    Bleeding/hemorrhagic complications: no    Recent/future hospitalizations: no    Any changes in medication regimen? yes       Details: Currently taking Colchicine for gout flare.   Recent/future dental: no  Any missed doses?: no       Is patient compliant with meds? yes       Allergies: 1)  Penicillin V Potassium (Penicillin V Potassium)  Anticoagulation Management History:      The patient is taking warfarin and comes in today for a routine follow up visit.  Positive risk factors for bleeding include an age of 73 years or older and presence of serious comorbidities.  The bleeding index is 'intermediate risk'.  Positive CHADS2 values include History of CHF, History of HTN, and History of Diabetes.  Negative CHADS2 values include Age > 59 years old.  His last INR was 1.4 ratio.  Anticoagulation responsible provider: [name] MD, [name].  INR POC: 3.1.  Cuvette Lot#: [id number].  Exp: 08/2010.    Anticoagulation Management Assessment/Plan:      The patient's current anticoagulation dose is Coumadin 2 mg tabs: as directed.  The target INR is 2.0-3.0.  The next INR is due 07/10/2010.  Anticoagulation instructions were given to patient.  Results were reviewed/authorized by Freddrick March, RN, BSN.  He was notified by Freddrick March RN.         Prior Anticoagulation Instructions: INR 4.1 Skip today then change dose to 1 tablet everyday except 1/2 tablet on Monday and Friday. Recheck in one week.   Current Anticoagulation Instructions: INR 3.1  Take 1/2  tablet this Friday and Saturday, then resume same dosage  1 tablet daily except 1/2 tablet on Mondays and  Fridays.  Recheck in 10 days.

## 2010-11-22 ENCOUNTER — Telehealth: Payer: Self-pay | Admitting: Cardiology

## 2010-11-22 NOTE — Progress Notes (Signed)
Summary: clearance letter for kidney transplant  Phone Note From Other Clinic   Caller: [facility] office [phone] Request: Talk with Nurse Details of Complaint: letter of clearance for kidney transplant Initial call taken by: Neil Crouch,  October 31, 2010 12:46 PM  Follow-up for Phone Call        Ernest Haber pt being evaluated for kidney transplant. need results of stress test and echo done within the last year. adv will investigate. her fax number is [phone] Follow-up by: Joan Flores RN,  October 31, 2010 12:55 PM  Additional Follow-up for Phone Call Additional follow up Details #1::        Spoke with [facility] @ [facility] Transplant Dept faxed Stress ( 07/17/05) & Echo (05/04/09) to [phone]. Additional Follow-up by: Barbette Reichmann, January 11,2012 1:11 PM

## 2010-11-22 NOTE — Medication Information (Signed)
Summary: rov/sp  Anticoagulant Therapy  Managed by: [name], PharmD Referring MD: Percival Spanish PCP: Alysia Penna, MD Supervising MD: Haroldine Laws MD, Quillian Quince Indication 1: Atrial Fibrillation Lab Used: [facility] Site: [place] INR POC 2.5 INR RANGE 2.0-3.0  Dietary changes: no    Health status changes: yes       Details: having catheter removed on Thursday.  Needs INR <2  Bleeding/hemorrhagic complications: no    Recent/future hospitalizations: no    Any changes in medication regimen? yes       Details: will be starting Lovenox this week  Recent/future dental: no  Any missed doses?: yes     Details: held Coumadin x 2 days    Allergies: 1)  Penicillin V Potassium (Penicillin V Potassium)  Anticoagulation Management History:      The patient is taking warfarin and comes in today for a routine follow up visit.  Positive risk factors for bleeding include an age of 73 years or older and presence of serious comorbidities.  The bleeding index is 'intermediate risk'.  Positive CHADS2 values include History of CHF, History of HTN, and History of Diabetes.  Negative CHADS2 values include Age > 73 years old.  His last INR was 1.4 ratio.  Anticoagulation responsible Octavious Zidek: Bensimhon MD, Quillian Quince.  INR POC: 2.5.  Cuvette Lot#: [id number].  Exp: 11/2011.    Anticoagulation Management Assessment/Plan:      The patient's current anticoagulation dose is Coumadin 2 mg tabs: as directed.  The target INR is 2.0-3.0.  The next INR is due 10/08/2010.  Anticoagulation instructions were given to patient.  Results were reviewed/authorized by [name], PharmD.  He was notified by [name] PharmD.         Prior Anticoagulation Instructions: INR 2.5  Decrease dose to 1 tablet every day except 1 1/2 tablets on Monday and Friday.  Recheck INR in 1-2 weeks.   Current Anticoagulation Instructions: INR 2.5  Continue to hold Coumadin. Start Lovenox 1 injection on Wednesday  morning around 7am.  Procedure on Thursday.  Restart Coumadin at regular dose on Thursday night.  Take 1 dose of Lovenox on Friday, Saturday, and Sunday.  Recheck INR on 12/19

## 2010-11-22 NOTE — Medication Information (Signed)
Summary: ROV/SP  Anticoagulant Therapy  Managed by: Freddrick March, RN Referring MD: Percival Spanish PCP: Alysia Penna, MD Supervising MD: Harrington Challenger MD, Nevin Bloodgood Indication 1: Atrial Fibrillation Lab Used: [facility] Site: [place] INR POC 3.7 INR RANGE 2.0-3.0  Dietary changes: yes       Details: Eating less since surgery  Health status changes: yes    Bleeding/hemorrhagic complications: no    Recent/future hospitalizations: yes       Details: Ablation last week  Any changes in medication regimen? yes       Details: Decreased allopurinol to 100 mg  Recent/future dental: no  Any missed doses?: no       Is patient compliant with meds? yes       Allergies: 1)  Penicillin V Potassium (Penicillin V Potassium)  Anticoagulation Management History:      The patient is taking warfarin and comes in today for a routine follow up visit.  Positive risk factors for bleeding include an age of 73 years or older and presence of serious comorbidities.  The bleeding index is 'intermediate risk'.  Positive CHADS2 values include History of CHF, History of HTN, and History of Diabetes.  Negative CHADS2 values include Age > 73 years old.  His last INR was 1.4 ratio.  Anticoagulation responsible provider: Harrington Challenger MD, Nevin Bloodgood.  INR POC: 3.7.  Cuvette Lot#: [id number].  Exp: 11/2011.    Anticoagulation Management Assessment/Plan:      The patient's current anticoagulation dose is Coumadin 2 mg tabs: as directed.  The target INR is 2.0-3.0.  The next INR is due 11/15/2010.  Anticoagulation instructions were given to patient.  Results were reviewed/authorized by Freddrick March, RN.  He was notified by Ernst Bowler, PharmD candidate.         Prior Anticoagulation Instructions: INR 2.1  Take an extra half tablet today and tomorrow.  Continue normal regimen of 1 tablet on Saturdays, Sundays, Tuesdays, Wednesdays, and Thursdays and 1 and 1/2 tablets on Monday and Friday.     Current Anticoagulation  Instructions: INR 3.7 (goal: 2-3)  Hold today's (Thursday) dose.  Take 1 tablet everyday except 2 tablets on Mondays and Fridays.  Recheck in 1 week.

## 2010-11-22 NOTE — Medication Information (Signed)
Summary: rov/tm  Anticoagulant Therapy  Managed by: [name], PharmD Referring MD: Percival Spanish PCP: Alysia Penna, MD Supervising MD: Ron Parker MD, Dellis Filbert Indication 1: Atrial Fibrillation Lab Used: [facility] [place] Site: [place] INR POC 2.1 INR RANGE 2.0-3.0  Dietary changes: no    Health status changes: no    Bleeding/hemorrhagic complications: no    Recent/future hospitalizations: no    Any changes in medication regimen? no    Recent/future dental: no  Any missed doses?: no       Is patient compliant with meds? yes       Allergies: 1)  Penicillin V Potassium (Penicillin V Potassium)  Anticoagulation Management History:      Positive risk factors for bleeding include an age of 30 years or older and presence of serious comorbidities.  The bleeding index is 'intermediate risk'.  Positive CHADS2 values include History of CHF, History of HTN, and History of Diabetes.  Negative CHADS2 values include Age > 54 years old.  His last INR was 1.4 ratio.  Anticoagulation responsible provider: Ron Parker MD, Dellis Filbert.  INR POC: 2.1.  Exp: 11/2011.    Anticoagulation Management Assessment/Plan:      The patient's current anticoagulation dose is Coumadin 2 mg tabs: as directed.  The target INR is 2.0-3.0.  The next INR is due 11/01/2010.  Anticoagulation instructions were given to patient.  Results were reviewed/authorized by [name], PharmD.  He was notified by Ernst Bowler, PharmD candidate.         Prior Anticoagulation Instructions: INR 2.5 Continue 2mg  daily except 3mg s on Mondays and Fridays. Recheck in one week.   Current Anticoagulation Instructions: INR 2.1  Take an extra half tablet today and tomorrow.  Continue normal regimen of 1 tablet on Saturdays, Sundays, Tuesdays, Wednesdays, and Thursdays and 1 and 1/2 tablets on Monday and Friday.

## 2010-11-22 NOTE — Progress Notes (Signed)
Summary: looking for bx results - none done  Phone Note Call from Patient Call back at Home Phone [phone]   Caller: Patient Call For: Laurey Morale MD Summary of Call: Pt was told by transplant team that he has pancreatic cysts.  He needs to know if Dr. Sarajane Jews has any biopsy results that he can take to them.  Initial call taken by: [facility] CMA AAMA,  October 12, 2010 10:45 AM  Follow-up for Phone Call        No he has never had them biopsied to my knowledge. These were seen in February on an MRI scan, and a 6 month follow up MRI was recommended. Did he ever have them scanned again after that?  Follow-up by: Laurey Morale MD,  October 16, 2010 1:25 PM  Additional Follow-up for Phone Call Additional follow up Details #1::        attempt to call - ans mach - LMtCB if further questions - no bx ever taken that we are aware of - needed f/u MRI from one done in Feb. - did he ever get that?   KIK Additional Follow-up by: Cay Schillings LPN,  December 27, 624THL 2:06 PM

## 2010-11-22 NOTE — Letter (Signed)
Summary: [facility]  [facility]   Imported By: Laural Benes 10/23/2010 11:31:17  _____________________________________________________________________  External Attachment:    Type:   Image     Comment:   External Document

## 2010-11-22 NOTE — Progress Notes (Signed)
Summary: Cardioversion scheduled  Phone Note Outgoing Call   Call placed by: Sim Boast, RN,  October 29, 2010 2:45 PM Call placed to: Patient Details for Reason: cardioversion Summary of Call: scheduled cardioversion for Thursday 11/01/2010 at 11 am with Dr Percival Spanish.  Case number [id number].  Instructions reviewed with pts care giver Initial call taken by: Sim Boast, RN,  October 29, 2010 2:46 PM

## 2010-11-22 NOTE — Medication Information (Signed)
Summary: rov/sp  Anticoagulant Therapy  Managed by: [name], PharmD Referring MD: Percival Spanish PCP: Alysia Penna, MD Supervising MD: Haroldine Laws MD, Quillian Quince Indication 1: Atrial Fibrillation Lab Used: [facility] [name] Site: [place] INR POC 2.2 INR RANGE 2.0-3.0  Dietary changes: no    Health status changes: no    Bleeding/hemorrhagic complications: no    Recent/future hospitalizations: no    Any changes in medication regimen? yes       Details: on doxycycline for bronchitis  Recent/future dental: no  Any missed doses?: no       Is patient compliant with meds? yes       Allergies: 1)  Penicillin V Potassium (Penicillin V Potassium)  Anticoagulation Management History:      The patient is taking warfarin and comes in today for a routine follow up visit.  Positive risk factors for bleeding include an age of 75 years or older and presence of serious comorbidities.  The bleeding index is 'intermediate risk'.  Positive CHADS2 values include History of CHF, History of HTN, and History of Diabetes.  Negative CHADS2 values include Age > 73 years old.  His last INR was 1.4 ratio.  Anticoagulation responsible provider: Bensimhon MD, Quillian Quince.  INR POC: 2.2.  Cuvette Lot#: [id number].  Exp: 10/2011.    Anticoagulation Management Assessment/Plan:      The patient's current anticoagulation dose is Coumadin 2 mg tabs: as directed.  The target INR is 2.0-3.0.  The next INR is due 10/15/2010.  Anticoagulation instructions were given to patient.  Results were reviewed/authorized by [name], PharmD.  He was notified by [name] PharmD.         Prior Anticoagulation Instructions: INR 2.5  Continue to hold Coumadin. Start Lovenox 1 injection on Wednesday morning around 7am.  Procedure on Thursday.  Restart Coumadin at regular dose on Thursday night.  Take 1 dose of Lovenox on Friday, Saturday, and Sunday.  Recheck INR on 12/19  Current Anticoagulation Instructions: INR  2.2  Continue same dose of 1 tablet every day except 1 1/2 tablets on Monday and Friday.  Stop Lovenox.  Recheck INR in 1 week.

## 2010-11-22 NOTE — Letter (Signed)
Summary: [facility]  [facility]   Imported By: Marilynne Drivers 10/25/2010 15:53:53  _____________________________________________________________________  External Attachment:    Type:   Image     Comment:   External Document

## 2010-11-22 NOTE — Letter (Signed)
Summary: Cardioversion/TEE Instructions  Press [facility], [facility] [address] [place]   [place], [place] [address]   Phone: [phone]  Fax: [phone]    Cardioversion / TEE Cardioversion Instructions  10/29/2010 MRN: [id number]  [facility] [address] [place], [place]  [address]  Dear Mr. Dellis, You are scheduled for a Cardioversion on Thursday November 01, 2010 with Dr. Percival Spanish   Please arrive at the [facility] at 9:30 a.m. on the day of your procedure.  1)   DIET:  A)   Nothing to eat or drink after midnight except your medications with a sip of water.    2)   MAKE SURE YOU TAKE YOUR COUMADIN.  3)  Must have a responsible person to drive you home.  4)   Bring a current list of your medications and current insurance cards.   * Special Note:  Every effort is made to have your procedure done on time. Occasionally there are emergencies that present themselves at the hospital that may cause delays. Please be patient if a delay does occur.  * If you have any questions after you get home, please call the office at [phone].

## 2010-11-22 NOTE — Medication Information (Signed)
Summary: rov  Anticoagulant Therapy  Managed by: Danella Penton, RN Referring MD: Percival Spanish PCP: Alysia Penna, MD Supervising MD: Ron Parker MD, Dellis Filbert Indication 1: Atrial Fibrillation Lab Used: [facility] [place] Site: [place] INR POC 3.0 INR RANGE 2.0-3.0  Dietary changes: no    Health status changes: no    Bleeding/hemorrhagic complications: no    Recent/future hospitalizations: yes       Details: TEE done 2 weeks ago  Any changes in medication regimen? no    Recent/future dental: no  Any missed doses?: no       Is patient compliant with meds? yes       Allergies: 1)  Penicillin V Potassium (Penicillin V Potassium)  Anticoagulation Management History:      Positive risk factors for bleeding include an age of 75 years or older and presence of serious comorbidities.  The bleeding index is 'intermediate risk'.  Positive CHADS2 values include History of CHF, History of HTN, and History of Diabetes.  Negative CHADS2 values include Age > 73 years old.  His last INR was 1.4 ratio.  Anticoagulation responsible provider: Ron Parker MD, Dellis Filbert.  INR POC: 3.0.  Exp: 11/2011.    Anticoagulation Management Assessment/Plan:      The patient's current anticoagulation dose is Coumadin 2 mg tabs: as directed.  The target INR is 2.0-3.0.  The next INR is due 11/29/2010.  Anticoagulation instructions were given to patient.  Results were reviewed/authorized by Danella Penton, RN.  He was notified by Danella Penton, RN.         Prior Anticoagulation Instructions: INR 3.7 (goal: 2-3)  Hold today's (Thursday) dose.  Take 1 tablet everyday except 2 tablets on Mondays and Fridays.  Recheck in 1 week.  Current Anticoagulation Instructions: INR 3.0 Continue to take 1tablet every day, except 2 tablets Monday and Friday. Recheck INR in 2 weeks.

## 2010-11-22 NOTE — Medication Information (Signed)
Summary: rov/sp  Anticoagulant Therapy  Managed by: Tula Nakayama, RN, BSN Referring MD: Percival Spanish PCP: Alysia Penna, MD Supervising MD: Caryl Comes MD, Remo Lipps Indication 1: Atrial Fibrillation Lab Used: [facility] [place] Site: [place] INR POC 2.5 INR RANGE 2.0-3.0  Dietary changes: no    Health status changes: no    Bleeding/hemorrhagic complications: no    Recent/future hospitalizations: no    Any changes in medication regimen? no    Recent/future dental: no  Any missed doses?: no       Is patient compliant with meds? yes      Comments: Pending DCCV  Allergies: 1)  Penicillin V Potassium (Penicillin V Potassium)  Anticoagulation Management History:      The patient is taking warfarin and comes in today for a routine follow up visit.  Positive risk factors for bleeding include an age of 75 years or older and presence of serious comorbidities.  The bleeding index is 'intermediate risk'.  Positive CHADS2 values include History of CHF, History of HTN, and History of Diabetes.  Negative CHADS2 values include Age > 20 years old.  His last INR was 1.4 ratio.  Anticoagulation responsible provider: Caryl Comes MD, Remo Lipps.  INR POC: 2.5.  Cuvette Lot#: [id number].  Exp: 11/2011.    Anticoagulation Management Assessment/Plan:      The patient's current anticoagulation dose is Coumadin 2 mg tabs: as directed.  The target INR is 2.0-3.0.  The next INR is due 10/25/2010.  Anticoagulation instructions were given to patient.  Results were reviewed/authorized by Tula Nakayama, RN, BSN.  He was notified by Tula Nakayama, RN, BSN.         Prior Anticoagulation Instructions: INR 2.2  Continue same dose of 1 tablet every day except 1 1/2 tablets on Monday and Friday.  Stop Lovenox.  Recheck INR in 1 week.   Current Anticoagulation Instructions: INR 2.5 Continue 2mg  daily except 3mg s on Mondays and Fridays. Recheck in one week.

## 2010-11-22 NOTE — Progress Notes (Signed)
  Walk in Patient Form Recieved " Pt Has questions about appointments" sent to [place] [facility]  November 08, 2010 1:22 PM

## 2010-11-22 NOTE — Progress Notes (Signed)
Summary: Pt req script oxycodone  Phone Note Refill Request Call back at Home Phone [phone] Message from:  Patient on October 19, 2010 9:05 AM  Refills Requested: Medication #1:  OXYCODONE HCL 5 MG TABS 1 q4h as needed   Dosage confirmed as above?Dosage Confirmed   Supply Requested: 1 month  Method Requested: Pick up at Office Initial call taken by: Braulio Bosch,  October 19, 2010 9:05 AM  Follow-up for Phone Call        done Follow-up by: Laurey Morale MD,  October 19, 2010 4:37 PM  Additional Follow-up for Phone Call Additional follow up Details #1::        pt aware.  Additional Follow-up by: Levora Angel, RN,  October 19, 2010 5:11 PM    New/Updated Medications: OXYCODONE HCL 5 MG TABS (OXYCODONE HCL) 1 q4h as needed Prescriptions: OXYCODONE HCL 5 MG TABS (OXYCODONE HCL) 1 q4h as needed  #120 x 0   Entered and Authorized by:   Laurey Morale MD   Signed by:   Laurey Morale MD on 10/19/2010   Method used:   Print then Give to Patient   RxID:   [id number]

## 2010-11-22 NOTE — Letter (Signed)
Summary: [facility]  [facility]   Imported By: Laural Benes 11/08/2010 10:21:54  _____________________________________________________________________  External Attachment:    Type:   Image     Comment:   External Document

## 2010-11-22 NOTE — Medication Information (Signed)
Summary: Lab Orders  Lab Orders   Imported By: Marilynne Drivers 10/25/2010 17:57:00  _____________________________________________________________________  External Attachment:    Type:   Image     Comment:   External Document

## 2010-11-27 ENCOUNTER — Encounter (INDEPENDENT_AMBULATORY_CARE_PROVIDER_SITE_OTHER): Payer: Medicare Other | Admitting: Cardiology

## 2010-11-27 ENCOUNTER — Encounter: Payer: Self-pay | Admitting: Cardiology

## 2010-11-27 DIAGNOSIS — I2510 Atherosclerotic heart disease of native coronary artery without angina pectoris: Secondary | ICD-10-CM

## 2010-11-27 DIAGNOSIS — I4891 Unspecified atrial fibrillation: Secondary | ICD-10-CM

## 2010-11-27 DIAGNOSIS — I359 Nonrheumatic aortic valve disorder, unspecified: Secondary | ICD-10-CM

## 2010-11-28 NOTE — Progress Notes (Signed)
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  Will review with Dr Percival Spanish for orders Initial call taken by: Sim Boast, RN,  November 13, 2010 10:08 AM

## 2010-11-28 NOTE — Progress Notes (Addendum)
Summary: kidney transplant  orders for Echo / nuc study  Phone Note From Other Clinic   Caller: [place] office [phone] Request: Talk with Nurse Summary of Call: status of paperwork & echo .spk with pam on 1/24.  pt need to have kidney transplant. -  Initial call taken by: Neil Crouch,  November 22, 2010 12:09 PM  Follow-up for Phone Call        Schedule a stress perfusion study and echo for transplant evaluation. Follow-up by: Minus Breeding, MD, [facility],  November 23, 2010 1:43 PM  Additional Follow-up for Phone Call Additional follow up Details #1::        orders placed for testing.  left message for pt and Lannette Donath that pt will be called to schedule appts. Additional Follow-up by: Sim Boast, RN,  November 23, 2010 4:16 PM  New Problems: PRE-OPERATIVE CARDIOVASCULAR EXAMINATION (ICD-V72.81)   New Problems: PRE-OPERATIVE CARDIOVASCULAR EXAMINATION (ICD-V72.81)

## 2010-11-29 ENCOUNTER — Encounter (INDEPENDENT_AMBULATORY_CARE_PROVIDER_SITE_OTHER): Payer: Medicare Other

## 2010-11-29 ENCOUNTER — Other Ambulatory Visit: Payer: Self-pay

## 2010-11-29 ENCOUNTER — Encounter: Payer: Self-pay | Admitting: Cardiology

## 2010-11-29 ENCOUNTER — Encounter: Payer: Self-pay | Admitting: Internal Medicine

## 2010-11-29 DIAGNOSIS — Z7901 Long term (current) use of anticoagulants: Secondary | ICD-10-CM

## 2010-11-29 DIAGNOSIS — I4891 Unspecified atrial fibrillation: Secondary | ICD-10-CM

## 2010-11-29 DIAGNOSIS — F419 Anxiety disorder, unspecified: Secondary | ICD-10-CM

## 2010-11-30 ENCOUNTER — Ambulatory Visit (HOSPITAL_COMMUNITY): Payer: Medicare Other

## 2010-11-30 MED ORDER — ALPRAZOLAM 0.5 MG PO TABS: 0.5000 mg | ORAL_TABLET | Freq: Four times a day (QID) | ORAL | Status: DC | PRN

## 2010-11-30 NOTE — Telephone Encounter (Signed)
[facility]

## 2010-12-03 ENCOUNTER — Telehealth: Payer: Self-pay | Admitting: *Deleted

## 2010-12-03 MED ORDER — ALLOPURINOL 100 MG PO TABS: 100.0000 mg | ORAL_TABLET | Freq: Every day | ORAL | Status: DC

## 2010-12-03 NOTE — Telephone Encounter (Signed)
Needs Allopurinol 100 mg.  URO wants him to decrease to 100 mg.

## 2010-12-03 NOTE — Telephone Encounter (Signed)
Left mess on cell phone med called in [facility] [place]

## 2010-12-03 NOTE — Telephone Encounter (Signed)
Call Allopurinol 100 mg qd , #30 with 11 rf

## 2010-12-04 DIAGNOSIS — I4891 Unspecified atrial fibrillation: Secondary | ICD-10-CM

## 2010-12-04 DIAGNOSIS — Z952 Presence of prosthetic heart valve: Secondary | ICD-10-CM | POA: Insufficient documentation

## 2010-12-04 DIAGNOSIS — Z7901 Long term (current) use of anticoagulants: Secondary | ICD-10-CM | POA: Insufficient documentation

## 2010-12-04 DIAGNOSIS — I359 Nonrheumatic aortic valve disorder, unspecified: Secondary | ICD-10-CM

## 2010-12-06 NOTE — Medication Information (Signed)
Summary: [facility] Clinic  Anticoagulant Therapy  Managed by: Freddrick March, RN, BSN Referring MD: Percival Spanish PCP: Alysia Penna, MD Supervising MD: Rayann Heman MD, Jeneen Rinks Indication 1: Atrial Fibrillation Lab Used: [facility] [place] Site: [place] INR POC 1.8 INR RANGE 2.0-3.0  Dietary changes: no    Health status changes: no    Bleeding/hemorrhagic complications: no    Recent/future hospitalizations: no    Any changes in medication regimen? no    Recent/future dental: no  Any missed doses?: yes     Details: Missed 1 dosage of 2mg  Monday 1 week ago, forgot to take pills on Friday pm, so took Monday am.    Is patient compliant with meds? yes      Comments: Pt states he has been taking 1 tablet daily except 1.5 tablets on MF.   Allergies: 1)  Penicillin V Potassium (Penicillin V Potassium)  Anticoagulation Management History:      The patient is taking warfarin and comes in today for a routine follow up visit.  Positive risk factors for bleeding include an age of 75 years or older and presence of serious comorbidities.  The bleeding index is 'intermediate risk'.  Positive CHADS2 values include History of CHF, History of HTN, and History of Diabetes.  Negative CHADS2 values include Age > 47 years old.  His last INR was 1.4 ratio.  Anticoagulation responsible provider: Joelle Flessner MD, Jeneen Rinks.  INR POC: 1.8.  Cuvette Lot#: [id number].  Exp: 10/2011.    Anticoagulation Management Assessment/Plan:      The patient's current anticoagulation dose is Coumadin 2 mg tabs: as directed.  The target INR is 2.0-3.0.  The next INR is due 12/13/2010.  Anticoagulation instructions were given to patient.  Results were reviewed/authorized by Freddrick March, RN, BSN.  He was notified by Freddrick March RN.         Prior Anticoagulation Instructions: INR 3.0 Continue to take 1tablet every day, except 2 tablets Monday and Friday. Recheck INR in 2 weeks.  Current Anticoagulation  Instructions: INR 1.8  Take 1.5 tablets today, then start taking 1 tablet daily except 1.5 tablets on Mondays, Wednesdays, and Fridays.  Recheck in 2 weeks.

## 2010-12-06 NOTE — Assessment & Plan Note (Signed)
Summary: EPH/POST MYOVIEW/LG/AMD   Visit Type:  Follow-up Referring Provider:  n/a Primary Provider:  Alysia Penna, MD  CC:  Pre Op.  History of Present Illness: The pateient presents for for preoperative evaluation prior to possible renal transplantation. He needs clearance from a cardiovascular standpoint forsince I last saw him he has been doing well. He tolerates dialysis. He does exercise walking up to mild one half though recent upper infections have kept him from cardiac rehabilitation. With this activity he feels much better than he did prior to his bypass and valve replacement. He denies any chest discomfort, neck or arm discomfort. He denies any palpitations, presyncope or syncope. He has no PND or orthopnea. He does tolerate his dialysis. He has had no weight gain or edema.  Current Medications (verified): 1)  Flonase 50 Mcg/act Susp (Fluticasone Propionate) .... 2 Sprays Each Nostril Q Day 2)  Lantus 100 Unit/ml Soln (Insulin Glargine) .... 20 Units At Bedtime 3)  Colcrys 0.6 Mg Tabs (Colchicine) .... Take As Needed For Gout 4)  Amiodarone Hcl 200 Mg Tabs (Amiodarone Hcl) .Marland Kitchen.. 1 Tablet By Mouth Once A Day 5)  Levothroid 25 Mcg Tabs (Levothyroxine Sodium) .Marland Kitchen.. 1 By Mouth Daily 6)  Metoprolol Succinate 50 Mg Xr24h-Tab (Metoprolol Succinate) .... Take One Tablet By Mouth Every Evening- Hold For Systolic Blood Pressure Less Than 100. 7)  Vol-Care Rx 1 Mg Tabs (B Complex-C-Folic Acid) .... Take 1 Tablet By Mouth Once A Day 8)  Ultram 50 Mg Tabs (Tramadol Hcl) .... As Needed 9)  Coumadin 2 Mg Tabs (Warfarin Sodium) .... As Directed 10)  Simvastatin 20 Mg Tabs (Simvastatin) .... Once Daily 11)  Alprazolam 0.5 Mg Tabs (Alprazolam) .Marland Kitchen.. 1 Q6h As Needed 12)  Oxycodone Hcl 5 Mg Tabs (Oxycodone Hcl) .Marland Kitchen.. 1 Q4h As Needed 13)  Miralax  Powd (Polyethylene Glycol 3350) .... Once Daily 14)  Onetouch Test  Strp (Glucose Blood) .... Use With Glucose Monitor Three Times A Day 15)  Nepro  Liqd 8 Oz  (Nutritional Supplements) .... Once Daily 16)  Fish Oil 1000 Mg Caps (Omega-3 Fatty Acids) .... Take 1 Cap By Mouth Daily. 17)  Doxycycline Hyclate 100 Mg Tabs (Doxycycline Hyclate) .Marland Kitchen.. 1 By Mouth Two Times A Day For 10 Days 18)  Lovenox 80 Mg/0.74ml Soln (Enoxaparin Sodium) .... Inject 1 Syringe Subcutaneously Daily As Directed 19)  Levaquin 500 Mg Tabs (Levofloxacin) .... One Tab Daily X 7 Days 20)  Allopurinol 100 Mg Tabs (Allopurinol) .Marland Kitchen.. 1 By Mouth Daily  Allergies (verified): 1)  Penicillin V Potassium (Penicillin V Potassium)  Past History:  Past Medical History: Coronary artery disease Non- ST elevation MI   11-2009(secondary to supply demand mismatch) hx of SEVERE AORTIC STENOSIS Hyperlipidemia Hypertension Diabetes mellitus, type 2 LE edema Mild carotid artery disease, Dopplers in 2007 Gout Obesity Chronic renal failure, on dialysis, sees Dr. Florene Glen. He has also seen Dr. Dimas Chyle [facility] Idiopathic hepatitis 11-2009 Atrial fibrillation Subcortical/cortical nonhemorrhagic CVA August 2011  Past Surgical History: Bone spurs removed from right heel Colonoscopy 05-09-06 per Dr. Ardis Hughs, repeat 3 years ADENOMATOUS Appendectomy Coronary artery bypass graft in     June 2011 with left internal mammary artery to the left anterior     descending, saphenous vein graft to the obtuse marginal, saphenous     vein graft to the posterior descending artery. Aortic valve replacement at the same time as bypass     surgery with pericardial tissue valve, size 25. Rght brachiocephalic atrioventricular fistula  in July 2011 as well as right internal jugular hemodialysis      catheter in May 2011.  Review of Systems       As stated in the HPI and negative for all other systems.   Vital Signs:  Patient profile:   75 year old male Height:      72.5 inches Weight:      190 pounds BMI:     25.51 Pulse rate:   81 / minute Resp:     16 per minute BP sitting:   124 / 73   (left arm)  Vitals Entered By: Levora Angel, CNA (November 27, 2010 12:01 PM)  Physical Exam  General:  Well-developed,well-nourished,in no acute distress; alert,appropriate and cooperative throughout examination Head:  normocephalic and atraumatic Eyes:  PERRLA/EOM intact; conjunctiva and lids normal. Mouth:  Teeth, gums and palate normal. Oral mucosa normal. Neck:  No deformities, masses, or tenderness noted. Chest Wall:  Well-healed sternotomy scar,dialysis catheter right upper chest Lungs:  Clear bilaterally to auscultation and percussion. Abdomen:  Bowel sounds positive; abdomen soft and non-tender without masses, organomegaly, or hernias noted. No hepatosplenomegaly. Msk:  Severe muscle wasting Pulses:  normal pedal pulses bilaterally.   Extremities:  bilateral mild lower extremity edema, right forearm dialysis graft with thrill and bruit Neurologic:  Alert and oriented x 3. Skin:  Intact without lesions or rashes. Cervical Nodes:  no significant adenopathy Inguinal Nodes:  no significant adenopathy Psych:  Normal affect.   Detailed Cardiovascular Exam  Neck    Carotids: Carotids full and equal bilaterally without bruits.      Neck Veins: Normal, no JVD.    Heart    Inspection: no deformities or lifts noted.      Palpation: normal PMI with no thrills palpable.      Auscultation: S1 and S2 within normal limits, no S3, irregular, 2/6 apical systolic murmur early peaking, no diastolic murmurs  Vascular    Abdominal Aorta: no palpable masses, pulsations, or audible bruits.      Femoral Pulses: normal femoral pulses bilaterally.      Pedal Pulses: normal pedal pulses bilaterally.      Radial Pulses: normal radial pulses bilaterally.      Peripheral Circulation: no clubbing, cyanosis, or edema noted with normal capillary refill.     EKG  Procedure date:  11/27/2010  Findings:      sinus rhythm, interventricular conduction delay, left axis deviation, first degree AV  block, poor anterior R-wave progression, QTC prolonged, no acute ST-T wave changes.  Impression & Recommendations:  Problem # 1:  PRE-OPERATIVE CARDIOVASCULAR EXAMINATION (ICD-V72.81) The patient has made a slow but significant recovery since his surgery last year. He is now in sinus rhythm status post cardioversion. He has a high functional level (greater than 5 METS) routinely without high-risk findings or symptoms. Given this and his recent revascularization stress testing is not indicated according to ACC/AHA guidelines prior to surgery. He would be acceptable risk. Of note he had a transesophageal ultrasound earlier this year and the valve functions normally. He does have a mildly reduced ejection fraction. Again, he would be an acceptable risk for renal transplantation.  Problem # 2:  ATRIAL FIBRILLATION (ICD-427.31) The patient is now in sinus.  I will continue the coumadin.  However, this could be stopped for any surgery.  Problem # 3:  HYPERTENSION (ICD-401.9) His meds are to continue as listed.  Other Orders: EKG w/ Interpretation (93000)  Patient Instructions: 1)  Your physician  recommends that you schedule a follow-up appointment in: 4 months 2)  Your physician recommends that you continue on your current medications as directed. Please refer to the Current Medication list given to you today.

## 2010-12-06 NOTE — Progress Notes (Signed)
Summary: [facility]: Office Visit  [facility]: Office Visit   Imported By: Roddie Mc 11/29/2010 17:00:50  _____________________________________________________________________  External Attachment:    Type:   Image     Comment:   External Document

## 2010-12-07 ENCOUNTER — Telehealth: Payer: Self-pay | Admitting: Cardiology

## 2010-12-13 ENCOUNTER — Encounter (INDEPENDENT_AMBULATORY_CARE_PROVIDER_SITE_OTHER): Payer: Medicare Other

## 2010-12-13 ENCOUNTER — Encounter: Payer: Self-pay | Admitting: Internal Medicine

## 2010-12-13 DIAGNOSIS — I4891 Unspecified atrial fibrillation: Secondary | ICD-10-CM

## 2010-12-13 DIAGNOSIS — Z7901 Long term (current) use of anticoagulants: Secondary | ICD-10-CM

## 2010-12-13 LAB — CONVERTED CEMR LAB: POC INR: 1.6

## 2010-12-14 ENCOUNTER — Encounter: Payer: Self-pay | Admitting: Cardiology

## 2010-12-14 ENCOUNTER — Other Ambulatory Visit: Payer: Self-pay | Admitting: Family Medicine

## 2010-12-17 ENCOUNTER — Encounter (HOSPITAL_COMMUNITY): Payer: Medicare Other | Attending: Cardiology

## 2010-12-17 ENCOUNTER — Other Ambulatory Visit: Payer: Self-pay

## 2010-12-17 DIAGNOSIS — D638 Anemia in other chronic diseases classified elsewhere: Secondary | ICD-10-CM | POA: Insufficient documentation

## 2010-12-17 DIAGNOSIS — I359 Nonrheumatic aortic valve disorder, unspecified: Secondary | ICD-10-CM | POA: Insufficient documentation

## 2010-12-17 DIAGNOSIS — E119 Type 2 diabetes mellitus without complications: Secondary | ICD-10-CM | POA: Insufficient documentation

## 2010-12-17 DIAGNOSIS — Z951 Presence of aortocoronary bypass graft: Secondary | ICD-10-CM | POA: Insufficient documentation

## 2010-12-17 DIAGNOSIS — I120 Hypertensive chronic kidney disease with stage 5 chronic kidney disease or end stage renal disease: Secondary | ICD-10-CM | POA: Insufficient documentation

## 2010-12-17 DIAGNOSIS — N186 End stage renal disease: Secondary | ICD-10-CM | POA: Insufficient documentation

## 2010-12-17 DIAGNOSIS — Z954 Presence of other heart-valve replacement: Secondary | ICD-10-CM | POA: Insufficient documentation

## 2010-12-17 DIAGNOSIS — I2510 Atherosclerotic heart disease of native coronary artery without angina pectoris: Secondary | ICD-10-CM | POA: Insufficient documentation

## 2010-12-17 DIAGNOSIS — I509 Heart failure, unspecified: Secondary | ICD-10-CM | POA: Insufficient documentation

## 2010-12-17 DIAGNOSIS — N2581 Secondary hyperparathyroidism of renal origin: Secondary | ICD-10-CM | POA: Insufficient documentation

## 2010-12-17 DIAGNOSIS — Z7982 Long term (current) use of aspirin: Secondary | ICD-10-CM | POA: Insufficient documentation

## 2010-12-17 DIAGNOSIS — I428 Other cardiomyopathies: Secondary | ICD-10-CM | POA: Insufficient documentation

## 2010-12-17 DIAGNOSIS — I5022 Chronic systolic (congestive) heart failure: Secondary | ICD-10-CM | POA: Insufficient documentation

## 2010-12-17 DIAGNOSIS — Z992 Dependence on renal dialysis: Secondary | ICD-10-CM | POA: Insufficient documentation

## 2010-12-17 DIAGNOSIS — Z5189 Encounter for other specified aftercare: Secondary | ICD-10-CM | POA: Insufficient documentation

## 2010-12-17 DIAGNOSIS — I4891 Unspecified atrial fibrillation: Secondary | ICD-10-CM | POA: Insufficient documentation

## 2010-12-17 DIAGNOSIS — E785 Hyperlipidemia, unspecified: Secondary | ICD-10-CM | POA: Insufficient documentation

## 2010-12-17 DIAGNOSIS — E039 Hypothyroidism, unspecified: Secondary | ICD-10-CM | POA: Insufficient documentation

## 2010-12-17 DIAGNOSIS — Z794 Long term (current) use of insulin: Secondary | ICD-10-CM | POA: Insufficient documentation

## 2010-12-17 DIAGNOSIS — R5381 Other malaise: Secondary | ICD-10-CM | POA: Insufficient documentation

## 2010-12-17 DIAGNOSIS — M109 Gout, unspecified: Secondary | ICD-10-CM | POA: Insufficient documentation

## 2010-12-17 DIAGNOSIS — I279 Pulmonary heart disease, unspecified: Secondary | ICD-10-CM | POA: Insufficient documentation

## 2010-12-17 DIAGNOSIS — E669 Obesity, unspecified: Secondary | ICD-10-CM | POA: Insufficient documentation

## 2010-12-18 LAB — GLUCOSE, CAPILLARY
Glucose-Capillary: 110 mg/dL — ABNORMAL HIGH (ref 70–99)
Glucose-Capillary: 107 mg/dL — ABNORMAL HIGH (ref 70–99)

## 2010-12-18 NOTE — Medication Information (Signed)
Summary: rov/ewj  Anticoagulant Therapy  Managed by: Danella Penton, RN Referring MD: Percival Spanish PCP: Alysia Penna, MD Supervising MD: [name] MD, [name] Indication 1: Atrial Fibrillation Lab Used: [facility] [place] Site: [place] INR POC 1.6 INR RANGE 2.0-3.0  Dietary changes: no    Health status changes: no    Bleeding/hemorrhagic complications: no    Recent/future hospitalizations: no    Any changes in medication regimen? no    Recent/future dental: no  Any missed doses?: no       Is patient compliant with meds? yes       Allergies: 1)  Penicillin V Potassium (Penicillin V Potassium)  Anticoagulation Management History:      The patient is taking warfarin and comes in today for a routine follow up visit.  Positive risk factors for bleeding include an age of 2 years or older and presence of serious comorbidities.  The bleeding index is 'intermediate risk'.  Positive CHADS2 values include History of CHF, History of HTN, and History of Diabetes.  Negative CHADS2 values include Age > 7 years old.  His last INR was 1.4 ratio.  Anticoagulation responsible provider: [name] MD, [name].  INR POC: 1.6.  Cuvette Lot#: [id number].  Exp: 10/2011.    Anticoagulation Management Assessment/Plan:      The patient's current anticoagulation dose is Coumadin 2 mg tabs: as directed.  The target INR is 2.0-3.0.  The next INR is due 12/20/2010.  Anticoagulation instructions were given to patient.  Results were reviewed/authorized by Danella Penton, RN.  He was notified by Danella Penton, RN.         Prior Anticoagulation Instructions: INR 1.8  Take 1.5 tablets today, then start taking 1 tablet daily except 1.5 tablets on Mondays, Wednesdays, and Fridays.  Recheck in 2 weeks.   Current Anticoagulation Instructions: INR 1.6 Today take 1 1/2 tablets today.  Then begin taking 1 tablet every day, except take 2 tablets on Mondays, Wednesdays, and Fridays. Recheck in 1 week.

## 2010-12-18 NOTE — Progress Notes (Signed)
Summary: req carotid doppler  Order Placed  Phone Note Call from Patient   Caller: Patient [phone] Reason for Call: Talk to Nurse Summary of Call: pt's saw a dr at [facility] re a poss transplant, he recommended he get a carotid doppler,can he get an order? Initial call taken by: Lorenda Hatchet,  December 07, 2010 4:27 PM  Follow-up for Phone Call        Transplant coordinator has told pt he needs carotid doppler done to be placed on active transplant list.  I told pt I would forward message to Dr. Percival Spanish and if OK with him we could do in our office.  Pt will be here for Coumadin clinic appt next Thursday and would like to have carotid done then if possible. Follow-up by: Thompson Grayer, RN, BSN,  December 07, 2010 4:38 PM  Additional Follow-up for Phone Call Additional follow up Details #1::        OK to order carotid doppler.  Per Dr Minus Breeding  Order placed for carotid doppler Additional Follow-up by: Sim Boast, RN,  December 10, 2010 8:30 AM

## 2010-12-19 ENCOUNTER — Other Ambulatory Visit: Payer: Self-pay

## 2010-12-19 ENCOUNTER — Encounter (HOSPITAL_COMMUNITY): Payer: Medicare Other

## 2010-12-20 ENCOUNTER — Encounter (INDEPENDENT_AMBULATORY_CARE_PROVIDER_SITE_OTHER): Payer: Medicare Other

## 2010-12-20 ENCOUNTER — Encounter: Payer: Self-pay | Admitting: Internal Medicine

## 2010-12-20 ENCOUNTER — Encounter: Payer: Self-pay | Admitting: Cardiology

## 2010-12-20 DIAGNOSIS — I6529 Occlusion and stenosis of unspecified carotid artery: Secondary | ICD-10-CM

## 2010-12-20 DIAGNOSIS — Z7901 Long term (current) use of anticoagulants: Secondary | ICD-10-CM

## 2010-12-20 DIAGNOSIS — I4891 Unspecified atrial fibrillation: Secondary | ICD-10-CM

## 2010-12-20 LAB — GLUCOSE, CAPILLARY: Glucose-Capillary: 155 mg/dL — ABNORMAL HIGH (ref 70–99)

## 2010-12-21 ENCOUNTER — Encounter (HOSPITAL_COMMUNITY): Payer: Medicare Other | Attending: Cardiology

## 2010-12-21 ENCOUNTER — Other Ambulatory Visit: Payer: Self-pay

## 2010-12-21 DIAGNOSIS — Z992 Dependence on renal dialysis: Secondary | ICD-10-CM | POA: Insufficient documentation

## 2010-12-21 DIAGNOSIS — E785 Hyperlipidemia, unspecified: Secondary | ICD-10-CM | POA: Insufficient documentation

## 2010-12-21 DIAGNOSIS — D638 Anemia in other chronic diseases classified elsewhere: Secondary | ICD-10-CM | POA: Insufficient documentation

## 2010-12-21 DIAGNOSIS — I279 Pulmonary heart disease, unspecified: Secondary | ICD-10-CM | POA: Insufficient documentation

## 2010-12-21 DIAGNOSIS — M109 Gout, unspecified: Secondary | ICD-10-CM | POA: Insufficient documentation

## 2010-12-21 DIAGNOSIS — Z794 Long term (current) use of insulin: Secondary | ICD-10-CM | POA: Insufficient documentation

## 2010-12-21 DIAGNOSIS — E039 Hypothyroidism, unspecified: Secondary | ICD-10-CM | POA: Insufficient documentation

## 2010-12-21 DIAGNOSIS — Z951 Presence of aortocoronary bypass graft: Secondary | ICD-10-CM | POA: Insufficient documentation

## 2010-12-21 DIAGNOSIS — I120 Hypertensive chronic kidney disease with stage 5 chronic kidney disease or end stage renal disease: Secondary | ICD-10-CM | POA: Insufficient documentation

## 2010-12-21 DIAGNOSIS — I428 Other cardiomyopathies: Secondary | ICD-10-CM | POA: Insufficient documentation

## 2010-12-21 DIAGNOSIS — N186 End stage renal disease: Secondary | ICD-10-CM | POA: Insufficient documentation

## 2010-12-21 DIAGNOSIS — Z954 Presence of other heart-valve replacement: Secondary | ICD-10-CM | POA: Insufficient documentation

## 2010-12-21 DIAGNOSIS — I2510 Atherosclerotic heart disease of native coronary artery without angina pectoris: Secondary | ICD-10-CM | POA: Insufficient documentation

## 2010-12-21 DIAGNOSIS — I5022 Chronic systolic (congestive) heart failure: Secondary | ICD-10-CM | POA: Insufficient documentation

## 2010-12-21 DIAGNOSIS — I4891 Unspecified atrial fibrillation: Secondary | ICD-10-CM | POA: Insufficient documentation

## 2010-12-21 DIAGNOSIS — E669 Obesity, unspecified: Secondary | ICD-10-CM | POA: Insufficient documentation

## 2010-12-21 DIAGNOSIS — Z7982 Long term (current) use of aspirin: Secondary | ICD-10-CM | POA: Insufficient documentation

## 2010-12-21 DIAGNOSIS — I359 Nonrheumatic aortic valve disorder, unspecified: Secondary | ICD-10-CM | POA: Insufficient documentation

## 2010-12-21 DIAGNOSIS — N2581 Secondary hyperparathyroidism of renal origin: Secondary | ICD-10-CM | POA: Insufficient documentation

## 2010-12-21 DIAGNOSIS — I509 Heart failure, unspecified: Secondary | ICD-10-CM | POA: Insufficient documentation

## 2010-12-21 DIAGNOSIS — R5381 Other malaise: Secondary | ICD-10-CM | POA: Insufficient documentation

## 2010-12-21 DIAGNOSIS — E119 Type 2 diabetes mellitus without complications: Secondary | ICD-10-CM | POA: Insufficient documentation

## 2010-12-21 DIAGNOSIS — Z5189 Encounter for other specified aftercare: Secondary | ICD-10-CM | POA: Insufficient documentation

## 2010-12-21 LAB — GLUCOSE, CAPILLARY
Glucose-Capillary: 137 mg/dL — ABNORMAL HIGH (ref 70–99)
Glucose-Capillary: 122 mg/dL — ABNORMAL HIGH (ref 70–99)

## 2010-12-24 ENCOUNTER — Encounter (HOSPITAL_COMMUNITY): Payer: Medicare Other

## 2010-12-24 ENCOUNTER — Telehealth: Payer: Self-pay | Admitting: Family Medicine

## 2010-12-24 NOTE — Telephone Encounter (Signed)
Left this info with wife. Patient will schedule an appt. If needed.

## 2010-12-24 NOTE — Telephone Encounter (Signed)
I don't think so, but we can certainly see him if he wishes

## 2010-12-24 NOTE — Telephone Encounter (Signed)
Triage vm----going to [facility] for about 4 months. Does he need to see Dr Sarajane Jews, anytime before April? Leave message. Can come any Tuesday or  Thursday.

## 2010-12-25 ENCOUNTER — Telehealth: Payer: Self-pay | Admitting: Cardiology

## 2010-12-26 ENCOUNTER — Encounter (HOSPITAL_COMMUNITY): Payer: Medicare Other

## 2010-12-27 ENCOUNTER — Encounter: Payer: Self-pay | Admitting: Cardiology

## 2010-12-27 ENCOUNTER — Other Ambulatory Visit: Payer: Self-pay | Admitting: Family Medicine

## 2010-12-27 ENCOUNTER — Encounter (INDEPENDENT_AMBULATORY_CARE_PROVIDER_SITE_OTHER): Payer: Medicare Other

## 2010-12-27 DIAGNOSIS — Z7901 Long term (current) use of anticoagulants: Secondary | ICD-10-CM

## 2010-12-27 DIAGNOSIS — I4891 Unspecified atrial fibrillation: Secondary | ICD-10-CM

## 2010-12-27 LAB — CONVERTED CEMR LAB: POC INR: 2.8

## 2010-12-27 NOTE — Letter (Signed)
Summary: Cardiac Rehab program   Cardiac Rehab program   Imported By: Gemma Payor 12/19/2010 16:17:40  _____________________________________________________________________  External Attachment:    Type:   Image     Comment:   External Document

## 2010-12-27 NOTE — Medication Information (Signed)
Summary: rov/pc  Anticoagulant Therapy  Managed by: Freddrick March, RN, BSN Referring MD: Percival Spanish PCP: Alysia Penna, MD Supervising MD: Harrington Challenger MD, Nevin Bloodgood Indication 1: Atrial Fibrillation Lab Used: [facility] [place] Site: [place] INR POC 1.9 INR RANGE 2.0-3.0  Dietary changes: no    Health status changes: no    Bleeding/hemorrhagic complications: no    Recent/future hospitalizations: no    Any changes in medication regimen? no    Recent/future dental: no  Any missed doses?: no       Is patient compliant with meds? yes       Allergies: 1)  Penicillin V Potassium (Penicillin V Potassium)  Anticoagulation Management History:      The patient is taking warfarin and comes in today for a routine follow up visit.  Positive risk factors for bleeding include an age of 73 years or older and presence of serious comorbidities.  The bleeding index is 'intermediate risk'.  Positive CHADS2 values include History of CHF, History of HTN, and History of Diabetes.  Negative CHADS2 values include Age > 73 years old.  His last INR was 1.4 ratio.  Anticoagulation responsible provider: Harrington Challenger MD, Nevin Bloodgood.  INR POC: 1.9.  Cuvette Lot#: [id number].  Exp: 10/2011.    Anticoagulation Management Assessment/Plan:      The patient's current anticoagulation dose is Coumadin 2 mg tabs: as directed.  The target INR is 2.0-3.0.  The next INR is due 12/27/2010.  Anticoagulation instructions were given to patient.  Results were reviewed/authorized by Freddrick March, RN, BSN.  He was notified by Freddrick March RN.         Prior Anticoagulation Instructions: INR 1.6 Today take 1 1/2 tablets today.  Then begin taking 1 tablet every day, except take 2 tablets on Mondays, Wednesdays, and Fridays. Recheck in 1 week.  Current Anticoagulation Instructions: INR 1.9  Take 2 tablets today, then resume same dosage 1 tablet daily except 2 tablets on Mondays, Wednesdays, and Fridays.  Recheck in 1 week.

## 2010-12-28 ENCOUNTER — Encounter (HOSPITAL_COMMUNITY): Payer: Medicare Other

## 2010-12-31 ENCOUNTER — Encounter (HOSPITAL_COMMUNITY): Payer: Medicare Other

## 2010-12-31 LAB — PROTIME-INR: INR: 1.56 — ABNORMAL HIGH (ref 0.00–1.49)

## 2010-12-31 LAB — GLUCOSE, CAPILLARY: Glucose-Capillary: 139 mg/dL — ABNORMAL HIGH (ref 70–99)

## 2011-01-01 LAB — BASIC METABOLIC PANEL
CO2: 28 mEq/L (ref 19–32)
Chloride: 95 mEq/L — ABNORMAL LOW (ref 96–112)
GFR calc Af Amer: 17 mL/min — ABNORMAL LOW (ref >60)
Glucose, Bld: 187 mg/dL — ABNORMAL HIGH (ref 70–99)
Potassium: 3.7 mEq/L (ref 3.5–5.1)
Sodium: 133 mEq/L — ABNORMAL LOW (ref 135–145)

## 2011-01-01 NOTE — Progress Notes (Signed)
Summary: refill meds for 4 months (coumadin)  Phone Note Call from Patient Call back at [facility] Phone [phone]   Refills Requested: Medication #1:  COUMADIN 2 MG TABS as directed [facility] [address]  [phone].   Caller: Patient Reason for Call: Talk to Nurse Summary of Call: pt going to [place] going for 4 months. need a rx for warfarin that would last for 4 months while he gone.  does jh need to see pt before he leaves.  Initial call taken by: Neil Crouch,  December 25, 2010 8:57 AM  Follow-up for Phone Call        Not due back until June 2012.  OK to follow up in July if thats when he is returning.  Will forward to Coumadin Clinic to discuss refill and management of anticoagulation.  Left message of the above. Follow-up by: Sim Boast, RN,  December 25, 2010 9:06 AM    Additional Follow-up for Phone Call Additional follow up Details #2::    [facility] for pt. regarding request for 4 month supply of coumadin.  Pt. is scheduled to come in to the coumadin clinic on Thursday 12/27/2010.  Will discuss refills as well as INR testing to be done in [place]. Follow-up by: Danella Penton, RN, BSN,  December 25, 2010 9:18 AM

## 2011-01-01 NOTE — Medication Information (Signed)
Summary: rov/ewj  Anticoagulant Therapy  Managed by: Danella Penton, RN Referring MD: Percival Spanish PCP: Alysia Penna, MD Supervising MD: Ron Parker MD, Dellis Filbert Indication 1: Atrial Fibrillation Lab Used: [facility] [place] Site: [place] INR POC 2.8 INR RANGE 2.0-3.0  Dietary changes: no    Health status changes: no    Bleeding/hemorrhagic complications: no    Recent/future hospitalizations: no    Any changes in medication regimen? no    Recent/future dental: no  Any missed doses?: no       Is patient compliant with meds? yes       Allergies: 1)  Penicillin V Potassium (Penicillin V Potassium)  Anticoagulation Management History:      The patient is taking warfarin and comes in today for a routine follow up visit.  Positive risk factors for bleeding include an age of 73 years or older and presence of serious comorbidities.  The bleeding index is 'intermediate risk'.  Positive CHADS2 values include History of CHF, History of HTN, and History of Diabetes.  Negative CHADS2 values include Age > 26 years old.  His last INR was 1.4 ratio.  Anticoagulation responsible provider: Ron Parker MD, Dellis Filbert.  INR POC: 2.8.  Cuvette Lot#: [id number].  Exp: 12/2011.    Anticoagulation Management Assessment/Plan:      The patient's current anticoagulation dose is Coumadin 2 mg tabs: as directed.  The target INR is 2.0-3.0.  The next INR is due 01/03/2011.  Anticoagulation instructions were given to patient.  Results were reviewed/authorized by Danella Penton, RN.  He was notified by Danella Penton, RN.         Prior Anticoagulation Instructions: INR 1.9  Take 2 tablets today, then resume same dosage 1 tablet daily except 2 tablets on Mondays, Wednesdays, and Fridays.  Recheck in 1 week.    Current Anticoagulation Instructions: INR 2.8 Continue taking 1 tablet every day, except take 2 tablets on Mondays, Wednesdays, and Fridays. Recheck in 1 week.

## 2011-01-02 ENCOUNTER — Encounter (HOSPITAL_COMMUNITY): Payer: Medicare Other

## 2011-01-02 LAB — GLUCOSE, CAPILLARY
Glucose-Capillary: 148 mg/dL — ABNORMAL HIGH (ref 70–99)
Glucose-Capillary: 163 mg/dL — ABNORMAL HIGH (ref 70–99)
Glucose-Capillary: 177 mg/dL — ABNORMAL HIGH (ref 70–99)
Glucose-Capillary: 186 mg/dL — ABNORMAL HIGH (ref 70–99)

## 2011-01-03 ENCOUNTER — Encounter: Payer: Self-pay | Admitting: Internal Medicine

## 2011-01-03 ENCOUNTER — Encounter (INDEPENDENT_AMBULATORY_CARE_PROVIDER_SITE_OTHER): Payer: Medicare Other

## 2011-01-03 DIAGNOSIS — Z952 Presence of prosthetic heart valve: Secondary | ICD-10-CM

## 2011-01-03 DIAGNOSIS — Z7901 Long term (current) use of anticoagulants: Secondary | ICD-10-CM

## 2011-01-03 DIAGNOSIS — I4891 Unspecified atrial fibrillation: Secondary | ICD-10-CM

## 2011-01-03 DIAGNOSIS — I359 Nonrheumatic aortic valve disorder, unspecified: Secondary | ICD-10-CM

## 2011-01-03 LAB — CONVERTED CEMR LAB: POC INR: 2

## 2011-01-04 ENCOUNTER — Encounter (HOSPITAL_COMMUNITY): Payer: Medicare Other

## 2011-01-04 LAB — GLUCOSE, CAPILLARY
Glucose-Capillary: 107 mg/dL — ABNORMAL HIGH (ref 70–99)
Glucose-Capillary: 109 mg/dL — ABNORMAL HIGH (ref 70–99)
Glucose-Capillary: 111 mg/dL — ABNORMAL HIGH (ref 70–99)
Glucose-Capillary: 113 mg/dL — ABNORMAL HIGH (ref 70–99)
Glucose-Capillary: 151 mg/dL — ABNORMAL HIGH (ref 70–99)

## 2011-01-04 LAB — LUPUS ANTICOAGULANT PANEL
Lupus Anticoagulant: NOT DETECTED
dRVVT Incubated 1:1 Mix: 42.3 secs (ref 36.2–44.3)

## 2011-01-04 LAB — COMPREHENSIVE METABOLIC PANEL
ALT: 13 U/L (ref 0–53)
ALT: 13 U/L (ref 0–53)
AST: 21 U/L (ref 0–37)
AST: 24 U/L (ref 0–37)
Albumin: 3.9 g/dL (ref 3.5–5.2)
CO2: 31 mEq/L (ref 19–32)
Calcium: 9.3 mg/dL (ref 8.4–10.5)
Chloride: 100 mEq/L (ref 96–112)
Creatinine, Ser: 2.17 mg/dL — ABNORMAL HIGH (ref 0.4–1.5)
Creatinine, Ser: 4.28 mg/dL — ABNORMAL HIGH (ref 0.4–1.5)
GFR calc Af Amer: 17 mL/min — ABNORMAL LOW (ref >60)
GFR calc Af Amer: 36 mL/min — ABNORMAL LOW (ref >60)
GFR calc non Af Amer: 30 mL/min — ABNORMAL LOW (ref >60)
Glucose, Bld: 110 mg/dL — ABNORMAL HIGH (ref 70–99)
Sodium: 138 mEq/L (ref 135–145)
Sodium: 139 mEq/L (ref 135–145)
Total Bilirubin: 1 mg/dL (ref 0.3–1.2)
Total Protein: 7.3 g/dL (ref 6.0–8.3)

## 2011-01-04 LAB — RENAL FUNCTION PANEL
Albumin: 3.6 g/dL (ref 3.5–5.2)
CO2: 31 mEq/L (ref 19–32)
Chloride: 97 mEq/L (ref 96–112)
Creatinine, Ser: 4.21 mg/dL — ABNORMAL HIGH (ref 0.4–1.5)
GFR calc Af Amer: 17 mL/min — ABNORMAL LOW (ref >60)
GFR calc non Af Amer: 14 mL/min — ABNORMAL LOW (ref >60)
Potassium: 3.1 mEq/L — ABNORMAL LOW (ref 3.5–5.1)
Sodium: 137 mEq/L (ref 135–145)

## 2011-01-04 LAB — CBC
HCT: 35.2 % — ABNORMAL LOW (ref 39.0–52.0)
HCT: 35.7 % — ABNORMAL LOW (ref 39.0–52.0)
HCT: 39.4 % (ref 39.0–52.0)
Hemoglobin: 11.1 g/dL — ABNORMAL LOW (ref 13.0–17.0)
Hemoglobin: 11.4 g/dL — ABNORMAL LOW (ref 13.0–17.0)
MCH: 29.4 pg (ref 26.0–34.0)
MCHC: 31.5 g/dL (ref 30.0–36.0)
MCV: 92.5 fL (ref 78.0–100.0)
Platelets: 90 10*3/uL — ABNORMAL LOW (ref 150–400)
RBC: 3.78 MIL/uL — ABNORMAL LOW (ref 4.22–5.81)
RBC: 3.92 MIL/uL — ABNORMAL LOW (ref 4.22–5.81)
RBC: 4.26 MIL/uL (ref 4.22–5.81)
RDW: 19.1 % — ABNORMAL HIGH (ref 11.5–15.5)
RDW: 19.1 % — ABNORMAL HIGH (ref 11.5–15.5)
WBC: 5.4 10*3/uL (ref 4.0–10.5)
WBC: 6.7 10*3/uL (ref 4.0–10.5)

## 2011-01-04 LAB — HEPARIN LEVEL (UNFRACTIONATED)
Heparin Unfractionated: 0.19 IU/mL — ABNORMAL LOW (ref 0.30–0.70)
Heparin Unfractionated: 0.44 IU/mL (ref 0.30–0.70)

## 2011-01-04 LAB — PROTIME-INR
INR: 1.92 — ABNORMAL HIGH (ref 0.00–1.49)
INR: 1.95 — ABNORMAL HIGH (ref 0.00–1.49)
INR: 2.32 — ABNORMAL HIGH (ref 0.00–1.49)
Prothrombin Time: 22.4 seconds — ABNORMAL HIGH (ref 11.6–15.2)

## 2011-01-04 LAB — APTT: aPTT: 50 seconds — ABNORMAL HIGH (ref 24–37)

## 2011-01-04 LAB — DIFFERENTIAL
Basophils Absolute: 0 10*3/uL (ref 0.0–0.1)
Basophils Absolute: 0.1 10*3/uL (ref 0.0–0.1)
Eosinophils Absolute: 0.3 10*3/uL (ref 0.0–0.7)
Eosinophils Relative: 6 % — ABNORMAL HIGH (ref 0–5)
Eosinophils Relative: 6 % — ABNORMAL HIGH (ref 0–5)
Lymphocytes Relative: 12 % (ref 12–46)
Lymphs Abs: 0.8 10*3/uL (ref 0.7–4.0)
Monocytes Absolute: 0.7 10*3/uL (ref 0.1–1.0)
Neutro Abs: 4.8 10*3/uL (ref 1.7–7.7)
Neutrophils Relative %: 73 % (ref 43–77)

## 2011-01-04 LAB — DIGOXIN LEVEL: Digoxin Level: 1.4 ng/mL (ref 0.8–2.0)

## 2011-01-04 LAB — BASIC METABOLIC PANEL
BUN: 23 mg/dL (ref 6–23)
Chloride: 90 mEq/L — ABNORMAL LOW (ref 96–112)
GFR calc Af Amer: 17 mL/min — ABNORMAL LOW (ref >60)
GFR calc non Af Amer: 14 mL/min — ABNORMAL LOW (ref >60)
Potassium: 3.6 mEq/L (ref 3.5–5.1)

## 2011-01-04 LAB — CARDIAC PANEL(CRET KIN+CKTOT+MB+TROPI)
CK, MB: 1.7 ng/mL (ref 0.3–4.0)
Relative Index: INVALID (ref 0.0–2.5)
Troponin I: 0.05 ng/mL (ref 0.00–0.06)

## 2011-01-04 LAB — LIPID PANEL
Cholesterol: 105 mg/dL (ref 0–200)
LDL Cholesterol: 38 mg/dL (ref 0–99)
Triglycerides: 122 mg/dL (ref <150)

## 2011-01-04 LAB — HOMOCYSTEINE: Homocysteine: 16.2 umol/L — ABNORMAL HIGH (ref 4.0–15.4)

## 2011-01-04 LAB — FACTOR 5 LEIDEN

## 2011-01-04 LAB — MAGNESIUM: Magnesium: 2 mg/dL (ref 1.5–2.5)

## 2011-01-04 LAB — BETA-2-GLYCOPROTEIN I ABS, IGG/M/A: Beta-2-Glycoprotein I IgM: 0 M Units (ref <20)

## 2011-01-04 LAB — CARDIOLIPIN ANTIBODIES, IGG, IGM, IGA
Anticardiolipin IgA: 7 APL U/mL — ABNORMAL LOW (ref <22)
Anticardiolipin IgG: 6 GPL U/mL — ABNORMAL LOW (ref <23)
Anticardiolipin IgM: 1 MPL U/mL — ABNORMAL LOW (ref <11)

## 2011-01-04 LAB — PROTEIN C, TOTAL: Protein C, Total: 52 % — ABNORMAL LOW (ref 70–140)

## 2011-01-06 LAB — GLUCOSE, CAPILLARY
Glucose-Capillary: 105 mg/dL — ABNORMAL HIGH (ref 70–99)
Glucose-Capillary: 108 mg/dL — ABNORMAL HIGH (ref 70–99)
Glucose-Capillary: 108 mg/dL — ABNORMAL HIGH (ref 70–99)
Glucose-Capillary: 109 mg/dL — ABNORMAL HIGH (ref 70–99)
Glucose-Capillary: 109 mg/dL — ABNORMAL HIGH (ref 70–99)
Glucose-Capillary: 112 mg/dL — ABNORMAL HIGH (ref 70–99)
Glucose-Capillary: 121 mg/dL — ABNORMAL HIGH (ref 70–99)
Glucose-Capillary: 123 mg/dL — ABNORMAL HIGH (ref 70–99)
Glucose-Capillary: 133 mg/dL — ABNORMAL HIGH (ref 70–99)
Glucose-Capillary: 140 mg/dL — ABNORMAL HIGH (ref 70–99)
Glucose-Capillary: 160 mg/dL — ABNORMAL HIGH (ref 70–99)
Glucose-Capillary: 274 mg/dL — ABNORMAL HIGH (ref 70–99)
Glucose-Capillary: 71 mg/dL (ref 70–99)
Glucose-Capillary: 72 mg/dL (ref 70–99)
Glucose-Capillary: 95 mg/dL (ref 70–99)
Glucose-Capillary: 102 mg/dL — ABNORMAL HIGH (ref 70–99)
Glucose-Capillary: 102 mg/dL — ABNORMAL HIGH (ref 70–99)
Glucose-Capillary: 103 mg/dL — ABNORMAL HIGH (ref 70–99)
Glucose-Capillary: 104 mg/dL — ABNORMAL HIGH (ref 70–99)
Glucose-Capillary: 104 mg/dL — ABNORMAL HIGH (ref 70–99)
Glucose-Capillary: 109 mg/dL — ABNORMAL HIGH (ref 70–99)
Glucose-Capillary: 110 mg/dL — ABNORMAL HIGH (ref 70–99)
Glucose-Capillary: 112 mg/dL — ABNORMAL HIGH (ref 70–99)
Glucose-Capillary: 115 mg/dL — ABNORMAL HIGH (ref 70–99)
Glucose-Capillary: 117 mg/dL — ABNORMAL HIGH (ref 70–99)
Glucose-Capillary: 119 mg/dL — ABNORMAL HIGH (ref 70–99)
Glucose-Capillary: 119 mg/dL — ABNORMAL HIGH (ref 70–99)
Glucose-Capillary: 121 mg/dL — ABNORMAL HIGH (ref 70–99)
Glucose-Capillary: 121 mg/dL — ABNORMAL HIGH (ref 70–99)
Glucose-Capillary: 121 mg/dL — ABNORMAL HIGH (ref 70–99)
Glucose-Capillary: 127 mg/dL — ABNORMAL HIGH (ref 70–99)
Glucose-Capillary: 130 mg/dL — ABNORMAL HIGH (ref 70–99)
Glucose-Capillary: 141 mg/dL — ABNORMAL HIGH (ref 70–99)
Glucose-Capillary: 144 mg/dL — ABNORMAL HIGH (ref 70–99)
Glucose-Capillary: 147 mg/dL — ABNORMAL HIGH (ref 70–99)
Glucose-Capillary: 149 mg/dL — ABNORMAL HIGH (ref 70–99)
Glucose-Capillary: 150 mg/dL — ABNORMAL HIGH (ref 70–99)
Glucose-Capillary: 152 mg/dL — ABNORMAL HIGH (ref 70–99)
Glucose-Capillary: 163 mg/dL — ABNORMAL HIGH (ref 70–99)
Glucose-Capillary: 179 mg/dL — ABNORMAL HIGH (ref 70–99)
Glucose-Capillary: 211 mg/dL — ABNORMAL HIGH (ref 70–99)
Glucose-Capillary: 59 mg/dL — ABNORMAL LOW (ref 70–99)
Glucose-Capillary: 62 mg/dL — ABNORMAL LOW (ref 70–99)
Glucose-Capillary: 63 mg/dL — ABNORMAL LOW (ref 70–99)
Glucose-Capillary: 63 mg/dL — ABNORMAL LOW (ref 70–99)
Glucose-Capillary: 70 mg/dL (ref 70–99)
Glucose-Capillary: 71 mg/dL (ref 70–99)
Glucose-Capillary: 72 mg/dL (ref 70–99)
Glucose-Capillary: 72 mg/dL (ref 70–99)
Glucose-Capillary: 76 mg/dL (ref 70–99)
Glucose-Capillary: 81 mg/dL (ref 70–99)
Glucose-Capillary: 92 mg/dL (ref 70–99)
Glucose-Capillary: 96 mg/dL (ref 70–99)
Glucose-Capillary: 98 mg/dL (ref 70–99)
Glucose-Capillary: 99 mg/dL (ref 70–99)

## 2011-01-06 LAB — RENAL FUNCTION PANEL
Albumin: 2.6 g/dL — ABNORMAL LOW (ref 3.5–5.2)
Albumin: 2.7 g/dL — ABNORMAL LOW (ref 3.5–5.2)
Albumin: 2.7 g/dL — ABNORMAL LOW (ref 3.5–5.2)
Albumin: 2.9 g/dL — ABNORMAL LOW (ref 3.5–5.2)
BUN: 15 mg/dL (ref 6–23)
BUN: 13 mg/dL (ref 6–23)
BUN: 16 mg/dL (ref 6–23)
BUN: 20 mg/dL (ref 6–23)
BUN: 23 mg/dL (ref 6–23)
BUN: 30 mg/dL — ABNORMAL HIGH (ref 6–23)
CO2: 24 mEq/L (ref 19–32)
CO2: 25 mEq/L (ref 19–32)
CO2: 26 mEq/L (ref 19–32)
CO2: 28 mEq/L (ref 19–32)
CO2: 28 mEq/L (ref 19–32)
Calcium: 8.4 mg/dL (ref 8.4–10.5)
Calcium: 8.4 mg/dL (ref 8.4–10.5)
Calcium: 8.4 mg/dL (ref 8.4–10.5)
Chloride: 100 mEq/L (ref 96–112)
Chloride: 103 mEq/L (ref 96–112)
Chloride: 97 mEq/L (ref 96–112)
Chloride: 97 mEq/L (ref 96–112)
Chloride: 98 mEq/L (ref 96–112)
Chloride: 99 mEq/L (ref 96–112)
Creatinine, Ser: 3.35 mg/dL — ABNORMAL HIGH (ref 0.4–1.5)
Creatinine, Ser: 2.66 mg/dL — ABNORMAL HIGH (ref 0.4–1.5)
Creatinine, Ser: 2.69 mg/dL — ABNORMAL HIGH (ref 0.4–1.5)
Creatinine, Ser: 3.22 mg/dL — ABNORMAL HIGH (ref 0.4–1.5)
GFR calc Af Amer: 22 mL/min — ABNORMAL LOW (ref >60)
GFR calc Af Amer: 26 mL/min — ABNORMAL LOW (ref >60)
GFR calc Af Amer: 29 mL/min — ABNORMAL LOW (ref >60)
GFR calc non Af Amer: 21 mL/min — ABNORMAL LOW (ref >60)
GFR calc non Af Amer: 24 mL/min — ABNORMAL LOW (ref >60)
Glucose, Bld: 61 mg/dL — ABNORMAL LOW (ref 70–99)
Glucose, Bld: 101 mg/dL — ABNORMAL HIGH (ref 70–99)
Glucose, Bld: 109 mg/dL — ABNORMAL HIGH (ref 70–99)
Glucose, Bld: 114 mg/dL — ABNORMAL HIGH (ref 70–99)
Glucose, Bld: 114 mg/dL — ABNORMAL HIGH (ref 70–99)
Phosphorus: 4.3 mg/dL (ref 2.3–4.6)
Phosphorus: 1.9 mg/dL — ABNORMAL LOW (ref 2.3–4.6)
Phosphorus: 3.5 mg/dL (ref 2.3–4.6)
Phosphorus: 4.2 mg/dL (ref 2.3–4.6)
Potassium: 3.7 mEq/L (ref 3.5–5.1)
Potassium: 3.3 mEq/L — ABNORMAL LOW (ref 3.5–5.1)
Potassium: 3.4 mEq/L — ABNORMAL LOW (ref 3.5–5.1)
Potassium: 3.5 mEq/L (ref 3.5–5.1)
Potassium: 3.7 mEq/L (ref 3.5–5.1)
Potassium: 3.9 mEq/L (ref 3.5–5.1)
Potassium: 4 mEq/L (ref 3.5–5.1)
Sodium: 129 mEq/L — ABNORMAL LOW (ref 135–145)
Sodium: 133 mEq/L — ABNORMAL LOW (ref 135–145)
Sodium: 133 mEq/L — ABNORMAL LOW (ref 135–145)
Sodium: 133 mEq/L — ABNORMAL LOW (ref 135–145)

## 2011-01-06 LAB — PROTIME-INR
INR: 3 — ABNORMAL HIGH (ref 0.00–1.49)
INR: 3.25 — ABNORMAL HIGH (ref 0.00–1.49)
INR: 3.79 — ABNORMAL HIGH (ref 0.00–1.49)
INR: 1.44 (ref 0.00–1.49)
INR: 1.45 (ref 0.00–1.49)
INR: 1.54 — ABNORMAL HIGH (ref 0.00–1.49)
INR: 1.59 — ABNORMAL HIGH (ref 0.00–1.49)
INR: 2.04 — ABNORMAL HIGH (ref 0.00–1.49)
INR: 2.14 — ABNORMAL HIGH (ref 0.00–1.49)
INR: 3.1 — ABNORMAL HIGH (ref 0.00–1.49)
INR: 3.26 — ABNORMAL HIGH (ref 0.00–1.49)
INR: 3.92 — ABNORMAL HIGH (ref 0.00–1.49)
INR: 4.05 — ABNORMAL HIGH (ref 0.00–1.49)
Prothrombin Time: 20.2 seconds — ABNORMAL HIGH (ref 11.6–15.2)
Prothrombin Time: 23.7 seconds — ABNORMAL HIGH (ref 11.6–15.2)
Prothrombin Time: 29.4 seconds — ABNORMAL HIGH (ref 11.6–15.2)
Prothrombin Time: 30.9 seconds — ABNORMAL HIGH (ref 11.6–15.2)
Prothrombin Time: 32.9 seconds — ABNORMAL HIGH (ref 11.6–15.2)
Prothrombin Time: 17.5 seconds — ABNORMAL HIGH (ref 11.6–15.2)
Prothrombin Time: 18.4 seconds — ABNORMAL HIGH (ref 11.6–15.2)
Prothrombin Time: 21.1 seconds — ABNORMAL HIGH (ref 11.6–15.2)
Prothrombin Time: 22.9 seconds — ABNORMAL HIGH (ref 11.6–15.2)
Prothrombin Time: 23.7 seconds — ABNORMAL HIGH (ref 11.6–15.2)
Prothrombin Time: 31.7 seconds — ABNORMAL HIGH (ref 11.6–15.2)
Prothrombin Time: 33 seconds — ABNORMAL HIGH (ref 11.6–15.2)
Prothrombin Time: 38.1 seconds — ABNORMAL HIGH (ref 11.6–15.2)

## 2011-01-06 LAB — POCT I-STAT 4, (NA,K, GLUC, HGB,HCT)
Glucose, Bld: 104 mg/dL — ABNORMAL HIGH (ref 70–99)
Glucose, Bld: 107 mg/dL — ABNORMAL HIGH (ref 70–99)
Glucose, Bld: 126 mg/dL — ABNORMAL HIGH (ref 70–99)
Glucose, Bld: 131 mg/dL — ABNORMAL HIGH (ref 70–99)
Glucose, Bld: 96 mg/dL (ref 70–99)
HCT: 25 % — ABNORMAL LOW (ref 39.0–52.0)
HCT: 26 % — ABNORMAL LOW (ref 39.0–52.0)
Hemoglobin: 10.5 g/dL — ABNORMAL LOW (ref 13.0–17.0)
Hemoglobin: 8.5 g/dL — ABNORMAL LOW (ref 13.0–17.0)
Hemoglobin: 8.8 g/dL — ABNORMAL LOW (ref 13.0–17.0)
Potassium: 3.9 mEq/L (ref 3.5–5.1)
Potassium: 4.2 mEq/L (ref 3.5–5.1)
Potassium: 4.8 mEq/L (ref 3.5–5.1)
Potassium: 5.4 mEq/L — ABNORMAL HIGH (ref 3.5–5.1)
Sodium: 133 mEq/L — ABNORMAL LOW (ref 135–145)
Sodium: 134 mEq/L — ABNORMAL LOW (ref 135–145)

## 2011-01-06 LAB — CBC
HCT: 28.8 % — ABNORMAL LOW (ref 39.0–52.0)
HCT: 30.6 % — ABNORMAL LOW (ref 39.0–52.0)
HCT: 28.5 % — ABNORMAL LOW (ref 39.0–52.0)
HCT: 28.7 % — ABNORMAL LOW (ref 39.0–52.0)
HCT: 29.4 % — ABNORMAL LOW (ref 39.0–52.0)
HCT: 30.4 % — ABNORMAL LOW (ref 39.0–52.0)
HCT: 30.5 % — ABNORMAL LOW (ref 39.0–52.0)
Hemoglobin: 10.8 g/dL — ABNORMAL LOW (ref 13.0–17.0)
Hemoglobin: 9.5 g/dL — ABNORMAL LOW (ref 13.0–17.0)
Hemoglobin: 9.9 g/dL — ABNORMAL LOW (ref 13.0–17.0)
Hemoglobin: 10.2 g/dL — ABNORMAL LOW (ref 13.0–17.0)
Hemoglobin: 10.6 g/dL — ABNORMAL LOW (ref 13.0–17.0)
Hemoglobin: 10.9 g/dL — ABNORMAL LOW (ref 13.0–17.0)
Hemoglobin: 8.6 g/dL — ABNORMAL LOW (ref 13.0–17.0)
Hemoglobin: 9.3 g/dL — ABNORMAL LOW (ref 13.0–17.0)
Hemoglobin: 9.4 g/dL — ABNORMAL LOW (ref 13.0–17.0)
Hemoglobin: 9.6 g/dL — ABNORMAL LOW (ref 13.0–17.0)
Hemoglobin: 9.8 g/dL — ABNORMAL LOW (ref 13.0–17.0)
MCH: 29.5 pg (ref 26.0–34.0)
MCH: 29.8 pg (ref 26.0–34.0)
MCH: 30.3 pg (ref 26.0–34.0)
MCH: 30.4 pg (ref 26.0–34.0)
MCH: 30.9 pg (ref 26.0–34.0)
MCH: 31 pg (ref 26.0–34.0)
MCHC: 32.2 g/dL (ref 30.0–36.0)
MCHC: 33.2 g/dL (ref 30.0–36.0)
MCHC: 33.9 g/dL (ref 30.0–36.0)
MCHC: 32.6 g/dL (ref 30.0–36.0)
MCHC: 33.1 g/dL (ref 30.0–36.0)
MCHC: 33.6 g/dL (ref 30.0–36.0)
MCHC: 33.7 g/dL (ref 30.0–36.0)
MCHC: 33.7 g/dL (ref 30.0–36.0)
MCHC: 33.9 g/dL (ref 30.0–36.0)
MCV: 91.4 fL (ref 78.0–100.0)
MCV: 90.8 fL (ref 78.0–100.0)
MCV: 92 fL (ref 78.0–100.0)
MCV: 92.2 fL (ref 78.0–100.0)
MCV: 92.9 fL (ref 78.0–100.0)
MCV: 92.9 fL (ref 78.0–100.0)
Platelets: 157 10*3/uL (ref 150–400)
Platelets: 111 10*3/uL — ABNORMAL LOW (ref 150–400)
Platelets: 131 10*3/uL — ABNORMAL LOW (ref 150–400)
Platelets: 137 10*3/uL — ABNORMAL LOW (ref 150–400)
Platelets: 156 10*3/uL (ref 150–400)
Platelets: 164 10*3/uL (ref 150–400)
Platelets: 170 10*3/uL (ref 150–400)
Platelets: 82 10*3/uL — ABNORMAL LOW (ref 150–400)
Platelets: 83 10*3/uL — ABNORMAL LOW (ref 150–400)
Platelets: 98 10*3/uL — ABNORMAL LOW (ref 150–400)
RBC: 3.35 MIL/uL — ABNORMAL LOW (ref 4.22–5.81)
RBC: 3.03 MIL/uL — ABNORMAL LOW (ref 4.22–5.81)
RBC: 3.03 MIL/uL — ABNORMAL LOW (ref 4.22–5.81)
RBC: 3.07 MIL/uL — ABNORMAL LOW (ref 4.22–5.81)
RBC: 3.24 MIL/uL — ABNORMAL LOW (ref 4.22–5.81)
RBC: 3.27 MIL/uL — ABNORMAL LOW (ref 4.22–5.81)
RBC: 3.57 MIL/uL — ABNORMAL LOW (ref 4.22–5.81)
RDW: 20.3 % — ABNORMAL HIGH (ref 11.5–15.5)
RDW: 20.9 % — ABNORMAL HIGH (ref 11.5–15.5)
RDW: 19.9 % — ABNORMAL HIGH (ref 11.5–15.5)
RDW: 20.6 % — ABNORMAL HIGH (ref 11.5–15.5)
RDW: 21.2 % — ABNORMAL HIGH (ref 11.5–15.5)
RDW: 21.6 % — ABNORMAL HIGH (ref 11.5–15.5)
RDW: 21.7 % — ABNORMAL HIGH (ref 11.5–15.5)
RDW: 21.9 % — ABNORMAL HIGH (ref 11.5–15.5)
RDW: 21.9 % — ABNORMAL HIGH (ref 11.5–15.5)
RDW: 22 % — ABNORMAL HIGH (ref 11.5–15.5)
RDW: 22.6 % — ABNORMAL HIGH (ref 11.5–15.5)
WBC: 7.7 10*3/uL (ref 4.0–10.5)
WBC: 11.3 10*3/uL — ABNORMAL HIGH (ref 4.0–10.5)
WBC: 11.6 10*3/uL — ABNORMAL HIGH (ref 4.0–10.5)
WBC: 7.9 10*3/uL (ref 4.0–10.5)
WBC: 8.1 10*3/uL (ref 4.0–10.5)
WBC: 8.4 10*3/uL (ref 4.0–10.5)
WBC: 8.4 10*3/uL (ref 4.0–10.5)
WBC: 9.3 10*3/uL (ref 4.0–10.5)
WBC: 9.8 10*3/uL (ref 4.0–10.5)

## 2011-01-06 LAB — BASIC METABOLIC PANEL
BUN: 20 mg/dL (ref 6–23)
BUN: 28 mg/dL — ABNORMAL HIGH (ref 6–23)
BUN: 12 mg/dL (ref 6–23)
BUN: 13 mg/dL (ref 6–23)
BUN: 14 mg/dL (ref 6–23)
CO2: 26 mEq/L (ref 19–32)
CO2: 28 mEq/L (ref 19–32)
CO2: 28 mEq/L (ref 19–32)
CO2: 19 mEq/L (ref 19–32)
CO2: 24 mEq/L (ref 19–32)
Calcium: 8.4 mg/dL (ref 8.4–10.5)
Calcium: 8.6 mg/dL (ref 8.4–10.5)
Calcium: 8.3 mg/dL — ABNORMAL LOW (ref 8.4–10.5)
Calcium: 8.3 mg/dL — ABNORMAL LOW (ref 8.4–10.5)
Calcium: 8.4 mg/dL (ref 8.4–10.5)
Calcium: 8.5 mg/dL (ref 8.4–10.5)
Chloride: 95 mEq/L — ABNORMAL LOW (ref 96–112)
Chloride: 101 mEq/L (ref 96–112)
Chloride: 106 mEq/L (ref 96–112)
Creatinine, Ser: 3.65 mg/dL — ABNORMAL HIGH (ref 0.4–1.5)
Creatinine, Ser: 1.8 mg/dL — ABNORMAL HIGH (ref 0.4–1.5)
GFR calc Af Amer: 20 mL/min — ABNORMAL LOW (ref >60)
GFR calc Af Amer: 31 mL/min — ABNORMAL LOW (ref >60)
GFR calc non Af Amer: 17 mL/min — ABNORMAL LOW (ref >60)
GFR calc non Af Amer: 26 mL/min — ABNORMAL LOW (ref >60)
GFR calc non Af Amer: 29 mL/min — ABNORMAL LOW (ref >60)
GFR calc non Af Amer: 30 mL/min — ABNORMAL LOW (ref >60)
Glucose, Bld: 117 mg/dL — ABNORMAL HIGH (ref 70–99)
Glucose, Bld: 94 mg/dL (ref 70–99)
Glucose, Bld: 96 mg/dL (ref 70–99)
Glucose, Bld: 142 mg/dL — ABNORMAL HIGH (ref 70–99)
Glucose, Bld: 149 mg/dL — ABNORMAL HIGH (ref 70–99)
Glucose, Bld: 78 mg/dL (ref 70–99)
Glucose, Bld: 90 mg/dL (ref 70–99)
Glucose, Bld: 91 mg/dL (ref 70–99)
Potassium: 3.6 mEq/L (ref 3.5–5.1)
Potassium: 4.1 mEq/L (ref 3.5–5.1)
Sodium: 130 mEq/L — ABNORMAL LOW (ref 135–145)
Sodium: 132 mEq/L — ABNORMAL LOW (ref 135–145)
Sodium: 132 mEq/L — ABNORMAL LOW (ref 135–145)
Sodium: 136 mEq/L (ref 135–145)
Sodium: 136 mEq/L (ref 135–145)
Sodium: 136 mEq/L (ref 135–145)

## 2011-01-06 LAB — POCT I-STAT 3, ART BLOOD GAS (G3+)
Acid-Base Excess: 1 mmol/L (ref 0.0–2.0)
Acid-base deficit: 2 mmol/L (ref 0.0–2.0)
Acid-base deficit: 5 mmol/L — ABNORMAL HIGH (ref 0.0–2.0)
Acid-base deficit: 6 mmol/L — ABNORMAL HIGH (ref 0.0–2.0)
Acid-base deficit: 7 mmol/L — ABNORMAL HIGH (ref 0.0–2.0)
Bicarbonate: 23.6 mEq/L (ref 20.0–24.0)
Bicarbonate: 25.4 mEq/L — ABNORMAL HIGH (ref 20.0–24.0)
Bicarbonate: 25.9 mEq/L — ABNORMAL HIGH (ref 20.0–24.0)
O2 Saturation: 100 %
O2 Saturation: 87 %
O2 Saturation: 94 %
O2 Saturation: 96 %
O2 Saturation: 96 %
Patient temperature: 35
TCO2: 20 mmol/L (ref 0–100)
TCO2: 23 mmol/L (ref 0–100)
TCO2: 25 mmol/L (ref 0–100)
TCO2: 27 mmol/L (ref 0–100)
pCO2 arterial: 37.1 mmHg (ref 35.0–45.0)
pCO2 arterial: 37.4 mmHg (ref 35.0–45.0)
pH, Arterial: 7.44 (ref 7.350–7.450)
pO2, Arterial: 76 mmHg — ABNORMAL LOW (ref 80.0–100.0)
pO2, Arterial: 78 mmHg — ABNORMAL LOW (ref 80.0–100.0)
pO2, Arterial: 84 mmHg (ref 80.0–100.0)

## 2011-01-06 LAB — COMPREHENSIVE METABOLIC PANEL
ALT: 13 U/L (ref 0–53)
AST: 28 U/L (ref 0–37)
CO2: 27 mEq/L (ref 19–32)
Calcium: 8.4 mg/dL (ref 8.4–10.5)
Chloride: 98 mEq/L (ref 96–112)
GFR calc Af Amer: 26 mL/min — ABNORMAL LOW (ref >60)
GFR calc non Af Amer: 22 mL/min — ABNORMAL LOW (ref >60)
Sodium: 134 mEq/L — ABNORMAL LOW (ref 135–145)

## 2011-01-06 LAB — HEPARIN LEVEL (UNFRACTIONATED)
Heparin Unfractionated: 0.23 IU/mL — ABNORMAL LOW (ref 0.30–0.70)
Heparin Unfractionated: <0.1 IU/mL — ABNORMAL LOW (ref 0.30–0.70)

## 2011-01-06 LAB — IRON AND TIBC: Iron: 27 ug/dL — ABNORMAL LOW (ref 42–135)

## 2011-01-06 LAB — PLATELET COUNT: Platelets: 114 10*3/uL — ABNORMAL LOW (ref 150–400)

## 2011-01-06 LAB — APTT: aPTT: 46 seconds — ABNORMAL HIGH (ref 24–37)

## 2011-01-06 LAB — HEMOGLOBIN AND HEMATOCRIT, BLOOD: HCT: 25.6 % — ABNORMAL LOW (ref 39.0–52.0)

## 2011-01-06 LAB — CREATININE, SERUM: GFR calc Af Amer: 60 mL/min — ABNORMAL LOW (ref >60)

## 2011-01-06 LAB — POCT I-STAT, CHEM 8
BUN: 8 mg/dL (ref 6–23)
Chloride: 103 mEq/L (ref 96–112)
Sodium: 134 mEq/L — ABNORMAL LOW (ref 135–145)
TCO2: 21 mmol/L (ref 0–100)

## 2011-01-06 LAB — PHOSPHORUS: Phosphorus: 3.5 mg/dL (ref 2.3–4.6)

## 2011-01-06 LAB — POCT I-STAT GLUCOSE: Operator id: 177181

## 2011-01-06 LAB — HEPATITIS B CORE ANTIBODY, TOTAL: Hep B Core Total Ab: NEGATIVE

## 2011-01-06 LAB — MAGNESIUM
Magnesium: 2 mg/dL (ref 1.5–2.5)
Magnesium: 2.1 mg/dL (ref 1.5–2.5)

## 2011-01-07 ENCOUNTER — Encounter (HOSPITAL_COMMUNITY): Payer: Medicare Other

## 2011-01-07 LAB — CBC
HCT: 28 % — ABNORMAL LOW (ref 39.0–52.0)
HCT: 29.5 % — ABNORMAL LOW (ref 39.0–52.0)
HCT: 30.5 % — ABNORMAL LOW (ref 39.0–52.0)
HCT: 31.2 % — ABNORMAL LOW (ref 39.0–52.0)
HCT: 31.4 % — ABNORMAL LOW (ref 39.0–52.0)
HCT: 33 % — ABNORMAL LOW (ref 39.0–52.0)
HCT: 33.5 % — ABNORMAL LOW (ref 39.0–52.0)
HCT: 39.4 % (ref 39.0–52.0)
Hemoglobin: 10.1 g/dL — ABNORMAL LOW (ref 13.0–17.0)
Hemoglobin: 10.6 g/dL — ABNORMAL LOW (ref 13.0–17.0)
Hemoglobin: 10.9 g/dL — ABNORMAL LOW (ref 13.0–17.0)
Hemoglobin: 11.1 g/dL — ABNORMAL LOW (ref 13.0–17.0)
Hemoglobin: 11.2 g/dL — ABNORMAL LOW (ref 13.0–17.0)
Hemoglobin: 11.4 g/dL — ABNORMAL LOW (ref 13.0–17.0)
Hemoglobin: 11.6 g/dL — ABNORMAL LOW (ref 13.0–17.0)
Hemoglobin: 9.4 g/dL — ABNORMAL LOW (ref 13.0–17.0)
Hemoglobin: 9.8 g/dL — ABNORMAL LOW (ref 13.0–17.0)
Hemoglobin: 13.1 g/dL (ref 13.0–17.0)
MCHC: 33 g/dL (ref 30.0–36.0)
MCHC: 33.1 g/dL (ref 30.0–36.0)
MCHC: 33.1 g/dL (ref 30.0–36.0)
MCHC: 33.3 g/dL (ref 30.0–36.0)
MCHC: 33.7 g/dL (ref 30.0–36.0)
MCHC: 33.8 g/dL (ref 30.0–36.0)
MCHC: 33.9 g/dL (ref 30.0–36.0)
MCHC: 34 g/dL (ref 30.0–36.0)
MCHC: 34 g/dL (ref 30.0–36.0)
MCHC: 33.9 g/dL (ref 30.0–36.0)
MCV: 91 fL (ref 78.0–100.0)
MCV: 91.2 fL (ref 78.0–100.0)
MCV: 91.4 fL (ref 78.0–100.0)
MCV: 92.1 fL (ref 78.0–100.0)
Platelets: 115 10*3/uL — ABNORMAL LOW (ref 150–400)
Platelets: 117 10*3/uL — ABNORMAL LOW (ref 150–400)
Platelets: 137 10*3/uL — ABNORMAL LOW (ref 150–400)
Platelets: 142 10*3/uL — ABNORMAL LOW (ref 150–400)
Platelets: 184 10*3/uL (ref 150–400)
Platelets: 95 10*3/uL — ABNORMAL LOW (ref 150–400)
Platelets: 204 10*3/uL (ref 150–400)
RBC: 3.18 MIL/uL — ABNORMAL LOW (ref 4.22–5.81)
RBC: 3.25 MIL/uL — ABNORMAL LOW (ref 4.22–5.81)
RBC: 3.36 MIL/uL — ABNORMAL LOW (ref 4.22–5.81)
RBC: 3.43 MIL/uL — ABNORMAL LOW (ref 4.22–5.81)
RBC: 3.57 MIL/uL — ABNORMAL LOW (ref 4.22–5.81)
RBC: 3.61 MIL/uL — ABNORMAL LOW (ref 4.22–5.81)
RBC: 3.63 MIL/uL — ABNORMAL LOW (ref 4.22–5.81)
RBC: 3.66 MIL/uL — ABNORMAL LOW (ref 4.22–5.81)
RBC: 4.29 MIL/uL (ref 4.22–5.81)
RBC: 4.52 MIL/uL (ref 4.22–5.81)
RDW: 18.3 % — ABNORMAL HIGH (ref 11.5–15.5)
RDW: 18.9 % — ABNORMAL HIGH (ref 11.5–15.5)
RDW: 18.9 % — ABNORMAL HIGH (ref 11.5–15.5)
RDW: 19.1 % — ABNORMAL HIGH (ref 11.5–15.5)
RDW: 19.4 % — ABNORMAL HIGH (ref 11.5–15.5)
RDW: 20.1 % — ABNORMAL HIGH (ref 11.5–15.5)
RDW: 20.8 % — ABNORMAL HIGH (ref 11.5–15.5)
RDW: 21.3 % — ABNORMAL HIGH (ref 11.5–15.5)
RDW: 18.7 % — ABNORMAL HIGH (ref 11.5–15.5)
RDW: 19 % — ABNORMAL HIGH (ref 11.5–15.5)
WBC: 6.1 10*3/uL (ref 4.0–10.5)
WBC: 6.3 10*3/uL (ref 4.0–10.5)
WBC: 6.6 10*3/uL (ref 4.0–10.5)
WBC: 7 10*3/uL (ref 4.0–10.5)
WBC: 7.3 10*3/uL (ref 4.0–10.5)
WBC: 7.9 10*3/uL (ref 4.0–10.5)
WBC: 7.9 10*3/uL (ref 4.0–10.5)
WBC: 12.2 10*3/uL — ABNORMAL HIGH (ref 4.0–10.5)
WBC: 12.4 10*3/uL — ABNORMAL HIGH (ref 4.0–10.5)

## 2011-01-07 LAB — GLUCOSE, CAPILLARY
Glucose-Capillary: 117 mg/dL — ABNORMAL HIGH (ref 70–99)
Glucose-Capillary: 119 mg/dL — ABNORMAL HIGH (ref 70–99)
Glucose-Capillary: 119 mg/dL — ABNORMAL HIGH (ref 70–99)
Glucose-Capillary: 120 mg/dL — ABNORMAL HIGH (ref 70–99)
Glucose-Capillary: 122 mg/dL — ABNORMAL HIGH (ref 70–99)
Glucose-Capillary: 123 mg/dL — ABNORMAL HIGH (ref 70–99)
Glucose-Capillary: 125 mg/dL — ABNORMAL HIGH (ref 70–99)
Glucose-Capillary: 126 mg/dL — ABNORMAL HIGH (ref 70–99)
Glucose-Capillary: 128 mg/dL — ABNORMAL HIGH (ref 70–99)
Glucose-Capillary: 131 mg/dL — ABNORMAL HIGH (ref 70–99)
Glucose-Capillary: 132 mg/dL — ABNORMAL HIGH (ref 70–99)
Glucose-Capillary: 133 mg/dL — ABNORMAL HIGH (ref 70–99)
Glucose-Capillary: 133 mg/dL — ABNORMAL HIGH (ref 70–99)
Glucose-Capillary: 134 mg/dL — ABNORMAL HIGH (ref 70–99)
Glucose-Capillary: 135 mg/dL — ABNORMAL HIGH (ref 70–99)
Glucose-Capillary: 137 mg/dL — ABNORMAL HIGH (ref 70–99)
Glucose-Capillary: 138 mg/dL — ABNORMAL HIGH (ref 70–99)
Glucose-Capillary: 138 mg/dL — ABNORMAL HIGH (ref 70–99)
Glucose-Capillary: 140 mg/dL — ABNORMAL HIGH (ref 70–99)
Glucose-Capillary: 144 mg/dL — ABNORMAL HIGH (ref 70–99)
Glucose-Capillary: 144 mg/dL — ABNORMAL HIGH (ref 70–99)
Glucose-Capillary: 145 mg/dL — ABNORMAL HIGH (ref 70–99)
Glucose-Capillary: 146 mg/dL — ABNORMAL HIGH (ref 70–99)
Glucose-Capillary: 146 mg/dL — ABNORMAL HIGH (ref 70–99)
Glucose-Capillary: 147 mg/dL — ABNORMAL HIGH (ref 70–99)
Glucose-Capillary: 149 mg/dL — ABNORMAL HIGH (ref 70–99)
Glucose-Capillary: 153 mg/dL — ABNORMAL HIGH (ref 70–99)
Glucose-Capillary: 154 mg/dL — ABNORMAL HIGH (ref 70–99)
Glucose-Capillary: 154 mg/dL — ABNORMAL HIGH (ref 70–99)
Glucose-Capillary: 156 mg/dL — ABNORMAL HIGH (ref 70–99)
Glucose-Capillary: 156 mg/dL — ABNORMAL HIGH (ref 70–99)
Glucose-Capillary: 156 mg/dL — ABNORMAL HIGH (ref 70–99)
Glucose-Capillary: 159 mg/dL — ABNORMAL HIGH (ref 70–99)
Glucose-Capillary: 164 mg/dL — ABNORMAL HIGH (ref 70–99)
Glucose-Capillary: 165 mg/dL — ABNORMAL HIGH (ref 70–99)
Glucose-Capillary: 166 mg/dL — ABNORMAL HIGH (ref 70–99)
Glucose-Capillary: 176 mg/dL — ABNORMAL HIGH (ref 70–99)
Glucose-Capillary: 183 mg/dL — ABNORMAL HIGH (ref 70–99)
Glucose-Capillary: 118 mg/dL — ABNORMAL HIGH (ref 70–99)
Glucose-Capillary: 128 mg/dL — ABNORMAL HIGH (ref 70–99)
Glucose-Capillary: 138 mg/dL — ABNORMAL HIGH (ref 70–99)
Glucose-Capillary: 155 mg/dL — ABNORMAL HIGH (ref 70–99)
Glucose-Capillary: 183 mg/dL — ABNORMAL HIGH (ref 70–99)
Glucose-Capillary: 197 mg/dL — ABNORMAL HIGH (ref 70–99)
Glucose-Capillary: 96 mg/dL (ref 70–99)

## 2011-01-07 LAB — RENAL FUNCTION PANEL
Albumin: 3 g/dL — ABNORMAL LOW (ref 3.5–5.2)
Albumin: 3.1 g/dL — ABNORMAL LOW (ref 3.5–5.2)
Albumin: 2.8 g/dL — ABNORMAL LOW (ref 3.5–5.2)
BUN: 19 mg/dL (ref 6–23)
BUN: 20 mg/dL (ref 6–23)
BUN: 43 mg/dL — ABNORMAL HIGH (ref 6–23)
Calcium: 8 mg/dL — ABNORMAL LOW (ref 8.4–10.5)
Calcium: 8.6 mg/dL (ref 8.4–10.5)
Calcium: 8.7 mg/dL (ref 8.4–10.5)
Calcium: 8.2 mg/dL — ABNORMAL LOW (ref 8.4–10.5)
Creatinine, Ser: 2.25 mg/dL — ABNORMAL HIGH (ref 0.4–1.5)
Creatinine, Ser: 2.31 mg/dL — ABNORMAL HIGH (ref 0.4–1.5)
Creatinine, Ser: 3.17 mg/dL — ABNORMAL HIGH (ref 0.4–1.5)
GFR calc Af Amer: 24 mL/min — ABNORMAL LOW (ref >60)
GFR calc non Af Amer: 19 mL/min — ABNORMAL LOW (ref >60)
Glucose, Bld: 128 mg/dL — ABNORMAL HIGH (ref 70–99)
Phosphorus: 3.7 mg/dL (ref 2.3–4.6)
Phosphorus: 3.8 mg/dL (ref 2.3–4.6)
Phosphorus: 5.5 mg/dL — ABNORMAL HIGH (ref 2.3–4.6)
Phosphorus: 6.3 mg/dL — ABNORMAL HIGH (ref 2.3–4.6)
Potassium: 4.1 mEq/L (ref 3.5–5.1)
Sodium: 129 mEq/L — ABNORMAL LOW (ref 135–145)
Sodium: 132 mEq/L — ABNORMAL LOW (ref 135–145)

## 2011-01-07 LAB — BLOOD GAS, ARTERIAL
Bicarbonate: 25.6 mEq/L — ABNORMAL HIGH (ref 20.0–24.0)
Patient temperature: 98.6
TCO2: 26.7 mmol/L (ref 0–100)
pH, Arterial: 7.475 — ABNORMAL HIGH (ref 7.350–7.450)

## 2011-01-07 LAB — BASIC METABOLIC PANEL
BUN: 30 mg/dL — ABNORMAL HIGH (ref 6–23)
BUN: 36 mg/dL — ABNORMAL HIGH (ref 6–23)
CO2: 25 mEq/L (ref 19–32)
CO2: 28 mEq/L (ref 19–32)
CO2: 19 mEq/L (ref 19–32)
CO2: 22 mEq/L (ref 19–32)
Calcium: 8 mg/dL — ABNORMAL LOW (ref 8.4–10.5)
Calcium: 8.8 mg/dL (ref 8.4–10.5)
Calcium: 8.9 mg/dL (ref 8.4–10.5)
Calcium: 8.6 mg/dL (ref 8.4–10.5)
Chloride: 95 mEq/L — ABNORMAL LOW (ref 96–112)
Chloride: 95 mEq/L — ABNORMAL LOW (ref 96–112)
Chloride: 97 mEq/L (ref 96–112)
Creatinine, Ser: 2.35 mg/dL — ABNORMAL HIGH (ref 0.4–1.5)
Creatinine, Ser: 2.48 mg/dL — ABNORMAL HIGH (ref 0.4–1.5)
Creatinine, Ser: 2.51 mg/dL — ABNORMAL HIGH (ref 0.4–1.5)
Creatinine, Ser: 3.41 mg/dL — ABNORMAL HIGH (ref 0.4–1.5)
GFR calc Af Amer: 31 mL/min — ABNORMAL LOW (ref >60)
GFR calc Af Amer: 33 mL/min — ABNORMAL LOW (ref >60)
GFR calc Af Amer: 33 mL/min — ABNORMAL LOW (ref >60)
GFR calc Af Amer: 22 mL/min — ABNORMAL LOW (ref >60)
GFR calc non Af Amer: 25 mL/min — ABNORMAL LOW (ref >60)
GFR calc non Af Amer: 26 mL/min — ABNORMAL LOW (ref >60)
GFR calc non Af Amer: 27 mL/min — ABNORMAL LOW (ref >60)
GFR calc non Af Amer: 17 mL/min — ABNORMAL LOW (ref >60)
GFR calc non Af Amer: 18 mL/min — ABNORMAL LOW (ref >60)
Glucose, Bld: 100 mg/dL — ABNORMAL HIGH (ref 70–99)
Potassium: 3.6 mEq/L (ref 3.5–5.1)
Potassium: 5.4 mEq/L — ABNORMAL HIGH (ref 3.5–5.1)
Sodium: 131 mEq/L — ABNORMAL LOW (ref 135–145)
Sodium: 134 mEq/L — ABNORMAL LOW (ref 135–145)
Sodium: 129 mEq/L — ABNORMAL LOW (ref 135–145)

## 2011-01-07 LAB — COMPREHENSIVE METABOLIC PANEL
ALT: 14 U/L (ref 0–53)
ALT: 16 U/L (ref 0–53)
ALT: 17 U/L (ref 0–53)
ALT: 23 U/L (ref 0–53)
ALT: 28 U/L (ref 0–53)
ALT: 41 U/L (ref 0–53)
AST: 30 U/L (ref 0–37)
AST: 34 U/L (ref 0–37)
AST: 49 U/L — ABNORMAL HIGH (ref 0–37)
AST: 120 U/L — ABNORMAL HIGH (ref 0–37)
Albumin: 2.5 g/dL — ABNORMAL LOW (ref 3.5–5.2)
Albumin: 3.1 g/dL — ABNORMAL LOW (ref 3.5–5.2)
Albumin: 3.2 g/dL — ABNORMAL LOW (ref 3.5–5.2)
Alkaline Phosphatase: 100 U/L (ref 39–117)
Alkaline Phosphatase: 104 U/L (ref 39–117)
Alkaline Phosphatase: 94 U/L (ref 39–117)
Alkaline Phosphatase: 95 U/L (ref 39–117)
BUN: 16 mg/dL (ref 6–23)
BUN: 8 mg/dL (ref 6–23)
CO2: 27 mEq/L (ref 19–32)
CO2: 29 mEq/L (ref 19–32)
CO2: 29 mEq/L (ref 19–32)
CO2: 31 mEq/L (ref 19–32)
CO2: 20 mEq/L (ref 19–32)
Calcium: 8.1 mg/dL — ABNORMAL LOW (ref 8.4–10.5)
Calcium: 8.4 mg/dL (ref 8.4–10.5)
Calcium: 9.1 mg/dL (ref 8.4–10.5)
Chloride: 110 mEq/L (ref 96–112)
Chloride: 98 mEq/L (ref 96–112)
Chloride: 98 mEq/L (ref 96–112)
Chloride: 98 mEq/L (ref 96–112)
Chloride: 98 mEq/L (ref 96–112)
Creatinine, Ser: 1.97 mg/dL — ABNORMAL HIGH (ref 0.4–1.5)
GFR calc Af Amer: 40 mL/min — ABNORMAL LOW (ref >60)
GFR calc Af Amer: 41 mL/min — ABNORMAL LOW (ref >60)
GFR calc Af Amer: 41 mL/min — ABNORMAL LOW (ref >60)
GFR calc Af Amer: 22 mL/min — ABNORMAL LOW (ref >60)
GFR calc non Af Amer: 27 mL/min — ABNORMAL LOW (ref >60)
GFR calc non Af Amer: 33 mL/min — ABNORMAL LOW (ref >60)
GFR calc non Af Amer: 34 mL/min — ABNORMAL LOW (ref >60)
GFR calc non Af Amer: 34 mL/min — ABNORMAL LOW (ref >60)
GFR calc non Af Amer: 47 mL/min — ABNORMAL LOW (ref >60)
GFR calc non Af Amer: 19 mL/min — ABNORMAL LOW (ref >60)
Glucose, Bld: 121 mg/dL — ABNORMAL HIGH (ref 70–99)
Glucose, Bld: 125 mg/dL — ABNORMAL HIGH (ref 70–99)
Glucose, Bld: 140 mg/dL — ABNORMAL HIGH (ref 70–99)
Glucose, Bld: 99 mg/dL (ref 70–99)
Potassium: 3 mEq/L — ABNORMAL LOW (ref 3.5–5.1)
Potassium: 3.3 mEq/L — ABNORMAL LOW (ref 3.5–5.1)
Potassium: 3.7 mEq/L (ref 3.5–5.1)
Potassium: 4.1 mEq/L (ref 3.5–5.1)
Potassium: 4.5 mEq/L (ref 3.5–5.1)
Potassium: 5.7 mEq/L — ABNORMAL HIGH (ref 3.5–5.1)
Sodium: 136 mEq/L (ref 135–145)
Sodium: 136 mEq/L (ref 135–145)
Sodium: 137 mEq/L (ref 135–145)
Sodium: 137 mEq/L (ref 135–145)
Sodium: 140 mEq/L (ref 135–145)
Sodium: 132 mEq/L — ABNORMAL LOW (ref 135–145)
Total Bilirubin: 1.1 mg/dL (ref 0.3–1.2)
Total Bilirubin: 1.4 mg/dL — ABNORMAL HIGH (ref 0.3–1.2)
Total Bilirubin: 2.3 mg/dL — ABNORMAL HIGH (ref 0.3–1.2)
Total Protein: 4.6 g/dL — ABNORMAL LOW (ref 6.0–8.3)
Total Protein: 6.1 g/dL (ref 6.0–8.3)

## 2011-01-07 LAB — URINALYSIS, ROUTINE W REFLEX MICROSCOPIC
Glucose, UA: NEGATIVE mg/dL
Nitrite: NEGATIVE
Specific Gravity, Urine: 1.028 (ref 1.005–1.030)
Specific Gravity, Urine: 1.023 (ref 1.005–1.030)
Urobilinogen, UA: 1 mg/dL (ref 0.0–1.0)
pH: 5 (ref 5.0–8.0)
pH: 5 (ref 5.0–8.0)

## 2011-01-07 LAB — DIFFERENTIAL
Basophils Absolute: 0 10*3/uL (ref 0.0–0.1)
Basophils Absolute: 0 10*3/uL (ref 0.0–0.1)
Basophils Absolute: 0.1 10*3/uL (ref 0.0–0.1)
Basophils Relative: 0 % (ref 0–1)
Basophils Relative: 1 % (ref 0–1)
Basophils Relative: 1 % (ref 0–1)
Basophils Relative: 1 % (ref 0–1)
Eosinophils Absolute: 0.4 10*3/uL (ref 0.0–0.7)
Eosinophils Absolute: 0.4 10*3/uL (ref 0.0–0.7)
Eosinophils Absolute: 0.5 10*3/uL (ref 0.0–0.7)
Eosinophils Absolute: 0.5 10*3/uL (ref 0.0–0.7)
Eosinophils Absolute: 0.5 10*3/uL (ref 0.0–0.7)
Eosinophils Absolute: 0.2 10*3/uL (ref 0.0–0.7)
Eosinophils Relative: 5 % (ref 0–5)
Eosinophils Relative: 7 % — ABNORMAL HIGH (ref 0–5)
Eosinophils Relative: 7 % — ABNORMAL HIGH (ref 0–5)
Eosinophils Relative: 8 % — ABNORMAL HIGH (ref 0–5)
Eosinophils Relative: 2 % (ref 0–5)
Lymphocytes Relative: 10 % — ABNORMAL LOW (ref 12–46)
Lymphs Abs: 0.6 10*3/uL — ABNORMAL LOW (ref 0.7–4.0)
Lymphs Abs: 0.7 10*3/uL (ref 0.7–4.0)
Lymphs Abs: 0.9 10*3/uL (ref 0.7–4.0)
Monocytes Absolute: 0.5 10*3/uL (ref 0.1–1.0)
Monocytes Absolute: 0.7 10*3/uL (ref 0.1–1.0)
Monocytes Relative: 7 % (ref 3–12)
Monocytes Relative: 8 % (ref 3–12)
Monocytes Relative: 8 % (ref 3–12)
Neutro Abs: 4.6 10*3/uL (ref 1.7–7.7)
Neutro Abs: 6.3 10*3/uL (ref 1.7–7.7)
Neutro Abs: 6.3 10*3/uL (ref 1.7–7.7)
Neutrophils Relative %: 73 % (ref 43–77)
Neutrophils Relative %: 74 % (ref 43–77)
Neutrophils Relative %: 74 % (ref 43–77)
Neutrophils Relative %: 78 % — ABNORMAL HIGH (ref 43–77)

## 2011-01-07 LAB — PHOSPHORUS
Phosphorus: 2.6 mg/dL (ref 2.3–4.6)
Phosphorus: 4.7 mg/dL — ABNORMAL HIGH (ref 2.3–4.6)

## 2011-01-07 LAB — MAGNESIUM
Magnesium: 1.5 mg/dL (ref 1.5–2.5)
Magnesium: 1.8 mg/dL (ref 1.5–2.5)
Magnesium: 2 mg/dL (ref 1.5–2.5)
Magnesium: 2 mg/dL (ref 1.5–2.5)

## 2011-01-07 LAB — POCT I-STAT 3, ART BLOOD GAS (G3+)
Bicarbonate: 16.6 mEq/L — ABNORMAL LOW (ref 20.0–24.0)
pCO2 arterial: 47.6 mmHg — ABNORMAL HIGH (ref 35.0–45.0)
pH, Arterial: 7.387 (ref 7.350–7.450)
pH, Arterial: 7.423 (ref 7.350–7.450)

## 2011-01-07 LAB — CULTURE, BLOOD (ROUTINE X 2): Culture: NO GROWTH

## 2011-01-07 LAB — URINE MICROSCOPIC-ADD ON

## 2011-01-07 LAB — CREATININE, URINE, RANDOM: Creatinine, Urine: 266.7 mg/dL

## 2011-01-07 LAB — HEPARIN INDUCED THROMBOCYTOPENIA PNL
Heparin Induced Plt Ab: POSITIVE
UFH High Dose UFH H: 0 % Release
UFH Low Dose 0.5 IU/mL: 0 % Release
UFH SRA Result: NEGATIVE

## 2011-01-07 LAB — LIPID PANEL
Cholesterol: 154 mg/dL (ref 0–200)
HDL: 38 mg/dL — ABNORMAL LOW (ref >39)
LDL Cholesterol: 93 mg/dL (ref 0–99)
Triglycerides: 116 mg/dL (ref <150)

## 2011-01-07 LAB — SODIUM, URINE, RANDOM: Sodium, Ur: <10 mEq/L

## 2011-01-07 LAB — PROTIME-INR: Prothrombin Time: 17.5 seconds — ABNORMAL HIGH (ref 11.6–15.2)

## 2011-01-07 LAB — POCT I-STAT 3, VENOUS BLOOD GAS (G3P V): Acid-Base Excess: 3 mmol/L — ABNORMAL HIGH (ref 0.0–2.0)

## 2011-01-07 LAB — HEPATIC FUNCTION PANEL
ALT: 58 U/L — ABNORMAL HIGH (ref 0–53)
AST: 165 U/L — ABNORMAL HIGH (ref 0–37)
Albumin: 3.1 g/dL — ABNORMAL LOW (ref 3.5–5.2)
Alkaline Phosphatase: 107 U/L (ref 39–117)
Total Protein: 5.9 g/dL — ABNORMAL LOW (ref 6.0–8.3)

## 2011-01-07 LAB — HEPATITIS B SURFACE ANTIGEN: Hepatitis B Surface Ag: NEGATIVE

## 2011-01-07 LAB — TYPE AND SCREEN: ABO/RH(D): A NEG

## 2011-01-07 LAB — T4, FREE: Free T4: 1.12 ng/dL (ref 0.80–1.80)

## 2011-01-07 LAB — CORTISOL: Cortisol, Plasma: 23.2 ug/dL

## 2011-01-07 LAB — APTT: aPTT: 43 seconds — ABNORMAL HIGH (ref 24–37)

## 2011-01-07 LAB — CK TOTAL AND CKMB (NOT AT ARMC): Total CK: 120 U/L (ref 7–232)

## 2011-01-07 LAB — ALBUMIN: Albumin: 2.8 g/dL — ABNORMAL LOW (ref 3.5–5.2)

## 2011-01-07 LAB — RETICULOCYTES: Retic Count, Absolute: 64.8 10*3/uL (ref 19.0–186.0)

## 2011-01-07 LAB — MRSA PCR SCREENING: MRSA by PCR: NEGATIVE

## 2011-01-07 LAB — URINE CULTURE
Culture: NO GROWTH
Culture: NO GROWTH

## 2011-01-07 LAB — HEPATITIS B CORE ANTIBODY, TOTAL: Hep B Core Total Ab: NEGATIVE

## 2011-01-07 LAB — POCT I-STAT, CHEM 8
BUN: 39 mg/dL — ABNORMAL HIGH (ref 6–23)
Calcium, Ion: 0.85 mmol/L — ABNORMAL LOW (ref 1.12–1.32)
Glucose, Bld: 104 mg/dL — ABNORMAL HIGH (ref 70–99)
HCT: 43 % (ref 39.0–52.0)
TCO2: 19 mmol/L (ref 0–100)

## 2011-01-07 LAB — IRON AND TIBC
Iron: 39 ug/dL — ABNORMAL LOW (ref 42–135)
Saturation Ratios: 8 % — ABNORMAL LOW (ref 20–55)
TIBC: 257 ug/dL (ref 215–435)
UIBC: 218 ug/dL
UIBC: 264 ug/dL

## 2011-01-07 LAB — LACTIC ACID, PLASMA: Lactic Acid, Venous: 3.8 mmol/L — ABNORMAL HIGH (ref 0.5–2.2)

## 2011-01-07 LAB — TSH: TSH: 4.651 u[IU]/mL — ABNORMAL HIGH (ref 0.350–4.500)

## 2011-01-07 LAB — FERRITIN: Ferritin: 501 ng/mL — ABNORMAL HIGH (ref 22–322)

## 2011-01-07 LAB — CARDIAC PANEL(CRET KIN+CKTOT+MB+TROPI): Relative Index: 3.6 — ABNORMAL HIGH (ref 0.0–2.5)

## 2011-01-07 LAB — TROPONIN I
Troponin I: 0.04 ng/mL (ref 0.00–0.06)
Troponin I: 0.11 ng/mL — ABNORMAL HIGH (ref 0.00–0.06)

## 2011-01-07 LAB — PTH, INTACT AND CALCIUM: Calcium, Total (PTH): 7.9 mg/dL — ABNORMAL LOW (ref 8.4–10.5)

## 2011-01-07 LAB — POCT CARDIAC MARKERS

## 2011-01-07 LAB — HEPATITIS B SURFACE ANTIBODY,QUALITATIVE: Hep B S Ab: NEGATIVE

## 2011-01-07 LAB — VITAMIN B12: Vitamin B-12: 773 pg/mL (ref 211–911)

## 2011-01-07 LAB — FOLATE: Folate: >20 ng/mL

## 2011-01-07 LAB — AMMONIA: Ammonia: 53 umol/L — ABNORMAL HIGH (ref 11–35)

## 2011-01-08 NOTE — Miscellaneous (Signed)
Summary: [facility] Progress Report   [place] Progress Report   Imported By: Sallee Provencal 12/31/2010 13:54:58  _____________________________________________________________________  External Attachment:    Type:   Image     Comment:   External Document

## 2011-01-08 NOTE — Medication Information (Signed)
Summary: rov/pc  Anticoagulant Therapy  Managed by: Tula Nakayama, RN, BSN Referring MD: Percival Spanish PCP: Alysia Penna, MD Supervising MD: Rayann Heman MD, Jeneen Rinks Indication 1: Atrial Fibrillation Lab Used: [facility] [place] Site: [place] INR POC 2.0 INR RANGE 2.0-3.0  Dietary changes: no    Health status changes: no    Bleeding/hemorrhagic complications: no    Recent/future hospitalizations: no    Any changes in medication regimen? no    Recent/future dental: no  Any missed doses?: no       Is patient compliant with meds? yes       Allergies: 1)  Penicillin V Potassium (Penicillin V Potassium)  Anticoagulation Management History:      The patient is taking warfarin and comes in today for a routine follow up visit.  Positive risk factors for bleeding include an age of 55 years or older and presence of serious comorbidities.  The bleeding index is 'intermediate risk'.  Positive CHADS2 values include History of CHF, History of HTN, and History of Diabetes.  Negative CHADS2 values include Age > 97 years old.  His last INR was 1.4 ratio.  Anticoagulation responsible provider: Lanetta Figuero MD, Jeneen Rinks.  INR POC: 2.0.  Cuvette Lot#: [id number].  Exp: 12/2011.    Anticoagulation Management Assessment/Plan:      The patient's current anticoagulation dose is Coumadin 2 mg tabs: as directed.  The target INR is 2.0-3.0.  The next INR is due 01/10/2011.  Anticoagulation instructions were given to patient.  Results were reviewed/authorized by Tula Nakayama, RN, BSN.  He was notified by Tula Nakayama, RN, BSN.        Coagulation management information includes: cell # [phone] for [facility] .  Prior Anticoagulation Instructions: INR 2.8 Continue taking 1 tablet every day, except take 2 tablets on Mondays, Wednesdays, and Fridays. Recheck in 1 week.  Current Anticoagulation Instructions: INR 2.0 Today take 2 tablets then resume 1  tablet everyday except 2 tablets on Mondays, Wednesdays  and Fridays. Recheck in 1 week.

## 2011-01-09 ENCOUNTER — Encounter (HOSPITAL_COMMUNITY): Payer: Medicare Other

## 2011-01-09 LAB — GLUCOSE, CAPILLARY
Glucose-Capillary: 110 mg/dL — ABNORMAL HIGH (ref 70–99)
Glucose-Capillary: 111 mg/dL — ABNORMAL HIGH (ref 70–99)
Glucose-Capillary: 114 mg/dL — ABNORMAL HIGH (ref 70–99)
Glucose-Capillary: 127 mg/dL — ABNORMAL HIGH (ref 70–99)
Glucose-Capillary: 132 mg/dL — ABNORMAL HIGH (ref 70–99)
Glucose-Capillary: 149 mg/dL — ABNORMAL HIGH (ref 70–99)
Glucose-Capillary: 164 mg/dL — ABNORMAL HIGH (ref 70–99)
Glucose-Capillary: 165 mg/dL — ABNORMAL HIGH (ref 70–99)
Glucose-Capillary: 173 mg/dL — ABNORMAL HIGH (ref 70–99)
Glucose-Capillary: 174 mg/dL — ABNORMAL HIGH (ref 70–99)
Glucose-Capillary: 188 mg/dL — ABNORMAL HIGH (ref 70–99)
Glucose-Capillary: 233 mg/dL — ABNORMAL HIGH (ref 70–99)

## 2011-01-09 LAB — CREATININE, URINE, RANDOM: Creatinine, Urine: 118.6 mg/dL

## 2011-01-09 LAB — COMPREHENSIVE METABOLIC PANEL
ALT: 1423 U/L — ABNORMAL HIGH (ref 0–53)
AST: 1048 U/L — ABNORMAL HIGH (ref 0–37)
AST: 453 U/L — ABNORMAL HIGH (ref 0–37)
AST: 8827 U/L — ABNORMAL HIGH (ref 0–37)
Albumin: 2.7 g/dL — ABNORMAL LOW (ref 3.5–5.2)
Albumin: 2.9 g/dL — ABNORMAL LOW (ref 3.5–5.2)
Albumin: 3.2 g/dL — ABNORMAL LOW (ref 3.5–5.2)
Albumin: 3.7 g/dL (ref 3.5–5.2)
Alkaline Phosphatase: 52 U/L (ref 39–117)
Alkaline Phosphatase: 54 U/L (ref 39–117)
Alkaline Phosphatase: 56 U/L (ref 39–117)
Alkaline Phosphatase: 63 U/L (ref 39–117)
Alkaline Phosphatase: 67 U/L (ref 39–117)
BUN: 39 mg/dL — ABNORMAL HIGH (ref 6–23)
BUN: 44 mg/dL — ABNORMAL HIGH (ref 6–23)
BUN: 44 mg/dL — ABNORMAL HIGH (ref 6–23)
BUN: 45 mg/dL — ABNORMAL HIGH (ref 6–23)
BUN: 46 mg/dL — ABNORMAL HIGH (ref 6–23)
CO2: 20 mEq/L (ref 19–32)
CO2: 21 mEq/L (ref 19–32)
CO2: 21 mEq/L (ref 19–32)
CO2: 22 mEq/L (ref 19–32)
Calcium: 7.8 mg/dL — ABNORMAL LOW (ref 8.4–10.5)
Calcium: 7.9 mg/dL — ABNORMAL LOW (ref 8.4–10.5)
Chloride: 101 mEq/L (ref 96–112)
Chloride: 104 mEq/L (ref 96–112)
Chloride: 104 mEq/L (ref 96–112)
Chloride: 107 mEq/L (ref 96–112)
Chloride: 108 mEq/L (ref 96–112)
Chloride: 110 mEq/L (ref 96–112)
Creatinine, Ser: 2.87 mg/dL — ABNORMAL HIGH (ref 0.4–1.5)
Creatinine, Ser: 3.11 mg/dL — ABNORMAL HIGH (ref 0.4–1.5)
Creatinine, Ser: 3.39 mg/dL — ABNORMAL HIGH (ref 0.4–1.5)
GFR calc Af Amer: 24 mL/min — ABNORMAL LOW (ref >60)
GFR calc Af Amer: 37 mL/min — ABNORMAL LOW (ref >60)
GFR calc Af Amer: 44 mL/min — ABNORMAL LOW (ref >60)
GFR calc non Af Amer: 18 mL/min — ABNORMAL LOW (ref >60)
GFR calc non Af Amer: 20 mL/min — ABNORMAL LOW (ref >60)
GFR calc non Af Amer: 31 mL/min — ABNORMAL LOW (ref >60)
GFR calc non Af Amer: 37 mL/min — ABNORMAL LOW (ref >60)
Glucose, Bld: 104 mg/dL — ABNORMAL HIGH (ref 70–99)
Glucose, Bld: 120 mg/dL — ABNORMAL HIGH (ref 70–99)
Glucose, Bld: 137 mg/dL — ABNORMAL HIGH (ref 70–99)
Glucose, Bld: 146 mg/dL — ABNORMAL HIGH (ref 70–99)
Potassium: 3.5 mEq/L (ref 3.5–5.1)
Potassium: 3.7 mEq/L (ref 3.5–5.1)
Potassium: 3.9 mEq/L (ref 3.5–5.1)
Potassium: 4.2 mEq/L (ref 3.5–5.1)
Potassium: 4.2 mEq/L (ref 3.5–5.1)
Potassium: 5.3 mEq/L — ABNORMAL HIGH (ref 3.5–5.1)
Sodium: 135 mEq/L (ref 135–145)
Total Bilirubin: 2.2 mg/dL — ABNORMAL HIGH (ref 0.3–1.2)
Total Bilirubin: 2.4 mg/dL — ABNORMAL HIGH (ref 0.3–1.2)
Total Bilirubin: 2.6 mg/dL — ABNORMAL HIGH (ref 0.3–1.2)
Total Bilirubin: 2.9 mg/dL — ABNORMAL HIGH (ref 0.3–1.2)
Total Bilirubin: 3.6 mg/dL — ABNORMAL HIGH (ref 0.3–1.2)
Total Protein: 4.8 g/dL — ABNORMAL LOW (ref 6.0–8.3)
Total Protein: 5.4 g/dL — ABNORMAL LOW (ref 6.0–8.3)
Total Protein: 6.2 g/dL (ref 6.0–8.3)

## 2011-01-09 LAB — DIFFERENTIAL
Basophils Absolute: 0 10*3/uL (ref 0.0–0.1)
Basophils Absolute: 0.1 10*3/uL (ref 0.0–0.1)
Basophils Relative: 0 % (ref 0–1)
Eosinophils Absolute: 0 10*3/uL (ref 0.0–0.7)
Eosinophils Relative: 0 % (ref 0–5)
Lymphocytes Relative: 4 % — ABNORMAL LOW (ref 12–46)
Lymphocytes Relative: 5 % — ABNORMAL LOW (ref 12–46)
Monocytes Absolute: 1.4 10*3/uL — ABNORMAL HIGH (ref 0.1–1.0)
Neutro Abs: 12.9 10*3/uL — ABNORMAL HIGH (ref 1.7–7.7)
Neutrophils Relative %: 88 % — ABNORMAL HIGH (ref 43–77)
Neutrophils Relative %: 88 % — ABNORMAL HIGH (ref 43–77)

## 2011-01-09 LAB — LACTIC ACID, PLASMA: Lactic Acid, Venous: 3.6 mmol/L — ABNORMAL HIGH (ref 0.5–2.2)

## 2011-01-09 LAB — CBC
HCT: 29 % — ABNORMAL LOW (ref 39.0–52.0)
HCT: 29.6 % — ABNORMAL LOW (ref 39.0–52.0)
HCT: 30 % — ABNORMAL LOW (ref 39.0–52.0)
HCT: 35.2 % — ABNORMAL LOW (ref 39.0–52.0)
Hemoglobin: 10 g/dL — ABNORMAL LOW (ref 13.0–17.0)
Hemoglobin: 10.1 g/dL — ABNORMAL LOW (ref 13.0–17.0)
Hemoglobin: 11.6 g/dL — ABNORMAL LOW (ref 13.0–17.0)
MCHC: 33.9 g/dL (ref 30.0–36.0)
MCHC: 34 g/dL (ref 30.0–36.0)
MCV: 94.8 fL (ref 78.0–100.0)
MCV: 95.1 fL (ref 78.0–100.0)
MCV: 95.1 fL (ref 78.0–100.0)
MCV: 95.7 fL (ref 78.0–100.0)
Platelets: 144 10*3/uL — ABNORMAL LOW (ref 150–400)
Platelets: 150 10*3/uL (ref 150–400)
Platelets: 157 10*3/uL (ref 150–400)
Platelets: 213 10*3/uL (ref 150–400)
RBC: 3.05 MIL/uL — ABNORMAL LOW (ref 4.22–5.81)
RBC: 3.13 MIL/uL — ABNORMAL LOW (ref 4.22–5.81)
RBC: 3.16 MIL/uL — ABNORMAL LOW (ref 4.22–5.81)
RDW: 18.7 % — ABNORMAL HIGH (ref 11.5–15.5)
WBC: 14.5 10*3/uL — ABNORMAL HIGH (ref 4.0–10.5)
WBC: 14.6 10*3/uL — ABNORMAL HIGH (ref 4.0–10.5)
WBC: 17.7 10*3/uL — ABNORMAL HIGH (ref 4.0–10.5)
WBC: 8.1 10*3/uL (ref 4.0–10.5)
WBC: 8.3 10*3/uL (ref 4.0–10.5)

## 2011-01-09 LAB — SEDIMENTATION RATE: Sed Rate: 30 mm/hr — ABNORMAL HIGH (ref 0–16)

## 2011-01-09 LAB — CARDIAC PANEL(CRET KIN+CKTOT+MB+TROPI)
CK, MB: 3.8 ng/mL (ref 0.3–4.0)
CK, MB: 4.3 ng/mL — ABNORMAL HIGH (ref 0.3–4.0)
Relative Index: 4.2 — ABNORMAL HIGH (ref 0.0–2.5)
Relative Index: INVALID (ref 0.0–2.5)
Total CK: 103 U/L (ref 7–232)
Total CK: 75 U/L (ref 7–232)
Troponin I: 1.14 ng/mL (ref 0.00–0.06)
Troponin I: 1.25 ng/mL (ref 0.00–0.06)

## 2011-01-09 LAB — URINALYSIS, ROUTINE W REFLEX MICROSCOPIC
Ketones, ur: NEGATIVE mg/dL
Specific Gravity, Urine: 1.022 (ref 1.005–1.030)
Urobilinogen, UA: 1 mg/dL (ref 0.0–1.0)

## 2011-01-09 LAB — URINE MICROSCOPIC-ADD ON

## 2011-01-09 LAB — CMV IGM: CMV IgM: <8 AU/mL (ref <30.0)

## 2011-01-09 LAB — PROTIME-INR
INR: 2.19 — ABNORMAL HIGH (ref 0.00–1.49)
Prothrombin Time: 24.2 seconds — ABNORMAL HIGH (ref 11.6–15.2)
Prothrombin Time: 27 seconds — ABNORMAL HIGH (ref 11.6–15.2)

## 2011-01-09 LAB — CMV ANTIBODY, IGG (EIA): CMV Ab - IgG: <0.2 IU/mL (ref <0.4)

## 2011-01-09 LAB — URINE CULTURE
Colony Count: NO GROWTH
Culture: NO GROWTH

## 2011-01-09 LAB — TSH: TSH: 0.991 u[IU]/mL (ref 0.350–4.500)

## 2011-01-09 LAB — LIPID PANEL
Total CHOL/HDL Ratio: 3.8 RATIO
VLDL: 34 mg/dL (ref 0–40)

## 2011-01-09 LAB — MRSA PCR SCREENING: MRSA by PCR: NEGATIVE

## 2011-01-09 LAB — CULTURE, BLOOD (ROUTINE X 2): Culture: NO GROWTH

## 2011-01-09 LAB — SODIUM, URINE, RANDOM: Sodium, Ur: 28 mEq/L

## 2011-01-09 LAB — HEPATITIS PANEL, ACUTE
Hep B C IgM: NEGATIVE
Hepatitis B Surface Ag: NEGATIVE

## 2011-01-09 LAB — EBV AB TO VIRAL CAPSID AG PNL, IGG+IGM: EBV VCA IgG: 7.33 {ISR} — ABNORMAL HIGH

## 2011-01-09 LAB — LIPASE, BLOOD: Lipase: 44 U/L (ref 11–59)

## 2011-01-10 ENCOUNTER — Ambulatory Visit (INDEPENDENT_AMBULATORY_CARE_PROVIDER_SITE_OTHER): Payer: Medicare Other | Admitting: *Deleted

## 2011-01-10 DIAGNOSIS — Z7901 Long term (current) use of anticoagulants: Secondary | ICD-10-CM

## 2011-01-10 DIAGNOSIS — I359 Nonrheumatic aortic valve disorder, unspecified: Secondary | ICD-10-CM

## 2011-01-10 DIAGNOSIS — I4891 Unspecified atrial fibrillation: Secondary | ICD-10-CM

## 2011-01-10 DIAGNOSIS — Z952 Presence of prosthetic heart valve: Secondary | ICD-10-CM

## 2011-01-10 NOTE — Patient Instructions (Signed)
INR 2.5 Begin taking 2 tablets on Sundays, Tuesdays, Thursdays, and Saturdays.  Also, take 1 tablet on Mondays, Wednesdays, and Fridays. Recheck in [place] in 1 week.

## 2011-01-11 ENCOUNTER — Encounter (HOSPITAL_COMMUNITY): Payer: Medicare Other

## 2011-01-13 LAB — CBC
HCT: 27.6 % — ABNORMAL LOW (ref 39.0–52.0)
HCT: 40.7 % (ref 39.0–52.0)
Hemoglobin: 13.1 g/dL (ref 13.0–17.0)
MCHC: 32.3 g/dL (ref 30.0–36.0)
MCHC: 33.4 g/dL (ref 30.0–36.0)
MCHC: 33.5 g/dL (ref 30.0–36.0)
MCV: 94.1 fL (ref 78.0–100.0)
MCV: 94.3 fL (ref 78.0–100.0)
MCV: 94.3 fL (ref 78.0–100.0)
Platelets: 137 10*3/uL — ABNORMAL LOW (ref 150–400)
Platelets: 140 10*3/uL — ABNORMAL LOW (ref 150–400)
Platelets: 198 10*3/uL (ref 150–400)
RBC: 4.32 MIL/uL (ref 4.22–5.81)
RDW: 17.7 % — ABNORMAL HIGH (ref 11.5–15.5)
RDW: 19 % — ABNORMAL HIGH (ref 11.5–15.5)
RDW: 19.3 % — ABNORMAL HIGH (ref 11.5–15.5)
WBC: 9.1 10*3/uL (ref 4.0–10.5)

## 2011-01-13 LAB — COMPREHENSIVE METABOLIC PANEL
ALT: 24 U/L (ref 0–53)
ALT: 439 U/L — ABNORMAL HIGH (ref 0–53)
AST: 83 U/L — ABNORMAL HIGH (ref 0–37)
Albumin: 3.7 g/dL (ref 3.5–5.2)
Alkaline Phosphatase: 92 U/L (ref 39–117)
Calcium: 8.1 mg/dL — ABNORMAL LOW (ref 8.4–10.5)
Chloride: 103 mEq/L (ref 96–112)
Creatinine, Ser: 1.89 mg/dL — ABNORMAL HIGH (ref 0.4–1.5)
GFR calc non Af Amer: 35 mL/min — ABNORMAL LOW (ref >60)
Glucose, Bld: 127 mg/dL — ABNORMAL HIGH (ref 70–99)
Glucose, Bld: 224 mg/dL — ABNORMAL HIGH (ref 70–99)
Potassium: 3.9 mEq/L (ref 3.5–5.1)
Potassium: 4.1 mEq/L (ref 3.5–5.1)
Sodium: 134 mEq/L — ABNORMAL LOW (ref 135–145)
Sodium: 137 mEq/L (ref 135–145)
Total Bilirubin: 1.8 mg/dL — ABNORMAL HIGH (ref 0.3–1.2)
Total Bilirubin: 2.3 mg/dL — ABNORMAL HIGH (ref 0.3–1.2)
Total Protein: 6.7 g/dL (ref 6.0–8.3)

## 2011-01-13 LAB — BASIC METABOLIC PANEL
BUN: 28 mg/dL — ABNORMAL HIGH (ref 6–23)
BUN: 30 mg/dL — ABNORMAL HIGH (ref 6–23)
CO2: 23 mEq/L (ref 19–32)
Calcium: 8.5 mg/dL (ref 8.4–10.5)
Chloride: 106 mEq/L (ref 96–112)
Chloride: 107 mEq/L (ref 96–112)
Creatinine, Ser: 1.77 mg/dL — ABNORMAL HIGH (ref 0.4–1.5)
Creatinine, Ser: 1.78 mg/dL — ABNORMAL HIGH (ref 0.4–1.5)
GFR calc Af Amer: 46 mL/min — ABNORMAL LOW (ref >60)
Glucose, Bld: 128 mg/dL — ABNORMAL HIGH (ref 70–99)
Glucose, Bld: 86 mg/dL (ref 70–99)
Potassium: 4 mEq/L (ref 3.5–5.1)

## 2011-01-13 LAB — URINALYSIS, ROUTINE W REFLEX MICROSCOPIC
Bilirubin Urine: NEGATIVE
Glucose, UA: NEGATIVE mg/dL
Hgb urine dipstick: NEGATIVE
Ketones, ur: NEGATIVE mg/dL
Leukocytes, UA: NEGATIVE
Nitrite: NEGATIVE
Protein, ur: 100 mg/dL — AB
Specific Gravity, Urine: 1.015 (ref 1.005–1.030)
Urobilinogen, UA: 0.2 mg/dL (ref 0.0–1.0)
pH: 5.5 (ref 5.0–8.0)

## 2011-01-13 LAB — GLUCOSE, CAPILLARY
Glucose-Capillary: 135 mg/dL — ABNORMAL HIGH (ref 70–99)
Glucose-Capillary: 135 mg/dL — ABNORMAL HIGH (ref 70–99)
Glucose-Capillary: 137 mg/dL — ABNORMAL HIGH (ref 70–99)
Glucose-Capillary: 143 mg/dL — ABNORMAL HIGH (ref 70–99)
Glucose-Capillary: 146 mg/dL — ABNORMAL HIGH (ref 70–99)
Glucose-Capillary: 152 mg/dL — ABNORMAL HIGH (ref 70–99)
Glucose-Capillary: 98 mg/dL (ref 70–99)

## 2011-01-13 LAB — D-DIMER, QUANTITATIVE

## 2011-01-13 LAB — COMPREHENSIVE METABOLIC PANEL WITH GFR
ALT: 607 U/L — ABNORMAL HIGH (ref 0–53)
AST: 111 U/L — ABNORMAL HIGH (ref 0–37)
AST: 51 U/L — ABNORMAL HIGH (ref 0–37)
Albumin: 2.8 g/dL — ABNORMAL LOW (ref 3.5–5.2)
Alkaline Phosphatase: 56 U/L (ref 39–117)
BUN: 21 mg/dL (ref 6–23)
BUN: 42 mg/dL — ABNORMAL HIGH (ref 6–23)
CO2: 22 meq/L (ref 19–32)
CO2: 22 meq/L (ref 19–32)
Calcium: 7.9 mg/dL — ABNORMAL LOW (ref 8.4–10.5)
Calcium: 8.9 mg/dL (ref 8.4–10.5)
Chloride: 109 meq/L (ref 96–112)
Creatinine, Ser: 2.03 mg/dL — ABNORMAL HIGH (ref 0.4–1.5)
Creatinine, Ser: 2.17 mg/dL — ABNORMAL HIGH (ref 0.4–1.5)
GFR calc Af Amer: 36 mL/min — ABNORMAL LOW (ref >60)
GFR calc non Af Amer: 30 mL/min — ABNORMAL LOW (ref >60)
GFR calc non Af Amer: 33 mL/min — ABNORMAL LOW
Glucose, Bld: 129 mg/dL — ABNORMAL HIGH (ref 70–99)
Potassium: 3.9 meq/L (ref 3.5–5.1)
Sodium: 138 meq/L (ref 135–145)
Total Bilirubin: 2.3 mg/dL — ABNORMAL HIGH (ref 0.3–1.2)
Total Protein: 5.6 g/dL — ABNORMAL LOW (ref 6.0–8.3)

## 2011-01-13 LAB — DIFFERENTIAL
Basophils Absolute: 0.1 K/uL (ref 0.0–0.1)
Basophils Relative: 1 % (ref 0–1)
Eosinophils Absolute: 0.3 10*3/uL (ref 0.0–0.7)
Eosinophils Relative: 3 % (ref 0–5)
Lymphocytes Relative: 9 % — ABNORMAL LOW (ref 12–46)
Lymphs Abs: 0.8 K/uL (ref 0.7–4.0)
Monocytes Absolute: 0.5 10*3/uL (ref 0.1–1.0)
Monocytes Relative: 6 % (ref 3–12)
Neutro Abs: 7.4 10*3/uL (ref 1.7–7.7)
Neutrophils Relative %: 81 % — ABNORMAL HIGH (ref 43–77)

## 2011-01-13 LAB — URINE MICROSCOPIC-ADD ON

## 2011-01-13 LAB — CK TOTAL AND CKMB (NOT AT ARMC)
CK, MB: 4.1 ng/mL — ABNORMAL HIGH (ref 0.3–4.0)
CK, MB: 4.2 ng/mL — ABNORMAL HIGH (ref 0.3–4.0)
Relative Index: 1.3 (ref 0.0–2.5)
Relative Index: 1.4 (ref 0.0–2.5)
Total CK: 302 U/L — ABNORMAL HIGH (ref 7–232)
Total CK: 324 U/L — ABNORMAL HIGH (ref 7–232)

## 2011-01-13 LAB — BRAIN NATRIURETIC PEPTIDE: Pro B Natriuretic peptide (BNP): 2110 pg/mL — ABNORMAL HIGH (ref 0.0–100.0)

## 2011-01-13 LAB — TROPONIN I
Troponin I: 0.05 ng/mL (ref 0.00–0.06)
Troponin I: 0.08 ng/mL — ABNORMAL HIGH (ref 0.00–0.06)

## 2011-01-13 LAB — FOLATE RBC: RBC Folate: 2146 ng/mL — ABNORMAL HIGH (ref 180–600)

## 2011-01-13 LAB — PROTIME-INR
INR: 1.4 (ref 0.00–1.49)
Prothrombin Time: 17 s — ABNORMAL HIGH (ref 11.6–15.2)

## 2011-01-13 LAB — AMMONIA: Ammonia: 26 umol/L (ref 11–35)

## 2011-01-13 LAB — HEPARIN LEVEL (UNFRACTIONATED)

## 2011-01-14 ENCOUNTER — Encounter (HOSPITAL_COMMUNITY): Payer: Medicare Other

## 2011-01-16 ENCOUNTER — Encounter (HOSPITAL_COMMUNITY): Payer: Medicare Other

## 2011-01-17 ENCOUNTER — Ambulatory Visit (INDEPENDENT_AMBULATORY_CARE_PROVIDER_SITE_OTHER): Payer: Medicare Other | Admitting: *Deleted

## 2011-01-17 DIAGNOSIS — Z952 Presence of prosthetic heart valve: Secondary | ICD-10-CM

## 2011-01-17 DIAGNOSIS — I4891 Unspecified atrial fibrillation: Secondary | ICD-10-CM

## 2011-01-17 DIAGNOSIS — I359 Nonrheumatic aortic valve disorder, unspecified: Secondary | ICD-10-CM

## 2011-01-17 DIAGNOSIS — Z7901 Long term (current) use of anticoagulants: Secondary | ICD-10-CM

## 2011-01-17 DIAGNOSIS — Z954 Presence of other heart-valve replacement: Secondary | ICD-10-CM

## 2011-01-17 NOTE — Patient Instructions (Signed)
INR 2.4 Return to clinic next week on 4/5 @ 245 (or earlier) Cont current regimen

## 2011-01-18 ENCOUNTER — Encounter (HOSPITAL_COMMUNITY): Payer: Medicare Other

## 2011-01-21 ENCOUNTER — Encounter (HOSPITAL_COMMUNITY): Payer: Medicare Other | Attending: Cardiology

## 2011-01-21 DIAGNOSIS — Z7982 Long term (current) use of aspirin: Secondary | ICD-10-CM | POA: Insufficient documentation

## 2011-01-21 DIAGNOSIS — D638 Anemia in other chronic diseases classified elsewhere: Secondary | ICD-10-CM | POA: Insufficient documentation

## 2011-01-21 DIAGNOSIS — I4891 Unspecified atrial fibrillation: Secondary | ICD-10-CM | POA: Insufficient documentation

## 2011-01-21 DIAGNOSIS — I428 Other cardiomyopathies: Secondary | ICD-10-CM | POA: Insufficient documentation

## 2011-01-21 DIAGNOSIS — I359 Nonrheumatic aortic valve disorder, unspecified: Secondary | ICD-10-CM | POA: Insufficient documentation

## 2011-01-21 DIAGNOSIS — E039 Hypothyroidism, unspecified: Secondary | ICD-10-CM | POA: Insufficient documentation

## 2011-01-21 DIAGNOSIS — I2510 Atherosclerotic heart disease of native coronary artery without angina pectoris: Secondary | ICD-10-CM | POA: Insufficient documentation

## 2011-01-21 DIAGNOSIS — I5022 Chronic systolic (congestive) heart failure: Secondary | ICD-10-CM | POA: Insufficient documentation

## 2011-01-21 DIAGNOSIS — Z951 Presence of aortocoronary bypass graft: Secondary | ICD-10-CM | POA: Insufficient documentation

## 2011-01-21 DIAGNOSIS — Z794 Long term (current) use of insulin: Secondary | ICD-10-CM | POA: Insufficient documentation

## 2011-01-21 DIAGNOSIS — M109 Gout, unspecified: Secondary | ICD-10-CM | POA: Insufficient documentation

## 2011-01-21 DIAGNOSIS — Z5189 Encounter for other specified aftercare: Secondary | ICD-10-CM | POA: Insufficient documentation

## 2011-01-21 DIAGNOSIS — N2581 Secondary hyperparathyroidism of renal origin: Secondary | ICD-10-CM | POA: Insufficient documentation

## 2011-01-21 DIAGNOSIS — Z954 Presence of other heart-valve replacement: Secondary | ICD-10-CM | POA: Insufficient documentation

## 2011-01-21 DIAGNOSIS — E119 Type 2 diabetes mellitus without complications: Secondary | ICD-10-CM | POA: Insufficient documentation

## 2011-01-21 DIAGNOSIS — I509 Heart failure, unspecified: Secondary | ICD-10-CM | POA: Insufficient documentation

## 2011-01-21 DIAGNOSIS — N186 End stage renal disease: Secondary | ICD-10-CM | POA: Insufficient documentation

## 2011-01-21 DIAGNOSIS — E669 Obesity, unspecified: Secondary | ICD-10-CM | POA: Insufficient documentation

## 2011-01-21 DIAGNOSIS — Z992 Dependence on renal dialysis: Secondary | ICD-10-CM | POA: Insufficient documentation

## 2011-01-21 DIAGNOSIS — I120 Hypertensive chronic kidney disease with stage 5 chronic kidney disease or end stage renal disease: Secondary | ICD-10-CM | POA: Insufficient documentation

## 2011-01-21 DIAGNOSIS — E785 Hyperlipidemia, unspecified: Secondary | ICD-10-CM | POA: Insufficient documentation

## 2011-01-21 DIAGNOSIS — I279 Pulmonary heart disease, unspecified: Secondary | ICD-10-CM | POA: Insufficient documentation

## 2011-01-21 DIAGNOSIS — R5381 Other malaise: Secondary | ICD-10-CM | POA: Insufficient documentation

## 2011-01-23 ENCOUNTER — Encounter: Payer: Self-pay | Admitting: Family Medicine

## 2011-01-23 ENCOUNTER — Telehealth: Payer: Self-pay | Admitting: Cardiology

## 2011-01-23 ENCOUNTER — Encounter (HOSPITAL_COMMUNITY): Payer: Medicare Other

## 2011-01-23 NOTE — Telephone Encounter (Signed)
Pt wife will be faxing some paper over for pam to fill out for blood work for the pt. Pt wife states the have a coumadin appt tomorrow and will pick up the papers tomorrow.

## 2011-01-24 ENCOUNTER — Ambulatory Visit (INDEPENDENT_AMBULATORY_CARE_PROVIDER_SITE_OTHER): Payer: Medicare Other | Admitting: *Deleted

## 2011-01-24 DIAGNOSIS — I4891 Unspecified atrial fibrillation: Secondary | ICD-10-CM

## 2011-01-24 DIAGNOSIS — Z954 Presence of other heart-valve replacement: Secondary | ICD-10-CM

## 2011-01-24 DIAGNOSIS — I359 Nonrheumatic aortic valve disorder, unspecified: Secondary | ICD-10-CM

## 2011-01-24 DIAGNOSIS — Z952 Presence of prosthetic heart valve: Secondary | ICD-10-CM

## 2011-01-24 DIAGNOSIS — Z7901 Long term (current) use of anticoagulants: Secondary | ICD-10-CM

## 2011-01-24 NOTE — Patient Instructions (Signed)
Continue on same dosage 2 tablets daily except 1 tablet on Mondays, Wednesdays, and Fridays.  Recheck in 2 weeks.

## 2011-01-25 ENCOUNTER — Encounter (HOSPITAL_COMMUNITY): Payer: Medicare Other

## 2011-01-28 ENCOUNTER — Encounter (HOSPITAL_COMMUNITY): Payer: Medicare Other

## 2011-01-30 ENCOUNTER — Encounter (HOSPITAL_COMMUNITY): Payer: Medicare Other

## 2011-02-01 ENCOUNTER — Encounter (HOSPITAL_COMMUNITY): Payer: Medicare Other

## 2011-02-04 ENCOUNTER — Encounter (HOSPITAL_COMMUNITY): Payer: Medicare Other

## 2011-02-06 ENCOUNTER — Encounter (HOSPITAL_COMMUNITY): Payer: Medicare Other

## 2011-02-08 ENCOUNTER — Encounter (HOSPITAL_COMMUNITY): Payer: Medicare Other

## 2011-02-11 ENCOUNTER — Encounter (HOSPITAL_COMMUNITY): Payer: Medicare Other

## 2011-02-13 ENCOUNTER — Encounter (HOSPITAL_COMMUNITY): Payer: Medicare Other

## 2011-02-15 ENCOUNTER — Encounter (HOSPITAL_COMMUNITY): Payer: Medicare Other

## 2011-02-18 ENCOUNTER — Encounter (HOSPITAL_COMMUNITY): Payer: Medicare Other

## 2011-02-20 ENCOUNTER — Encounter (HOSPITAL_COMMUNITY): Payer: Medicare Other

## 2011-02-22 IMAGING — CR DG CHEST 2V
2 series · 2 of 2 positions shown · non-contrast
Comparison: July 12, 2010

CLINICAL DATA: Shortness of breath, cough

CHEST - 2 VIEW

[view not recorded (1 of 2)]
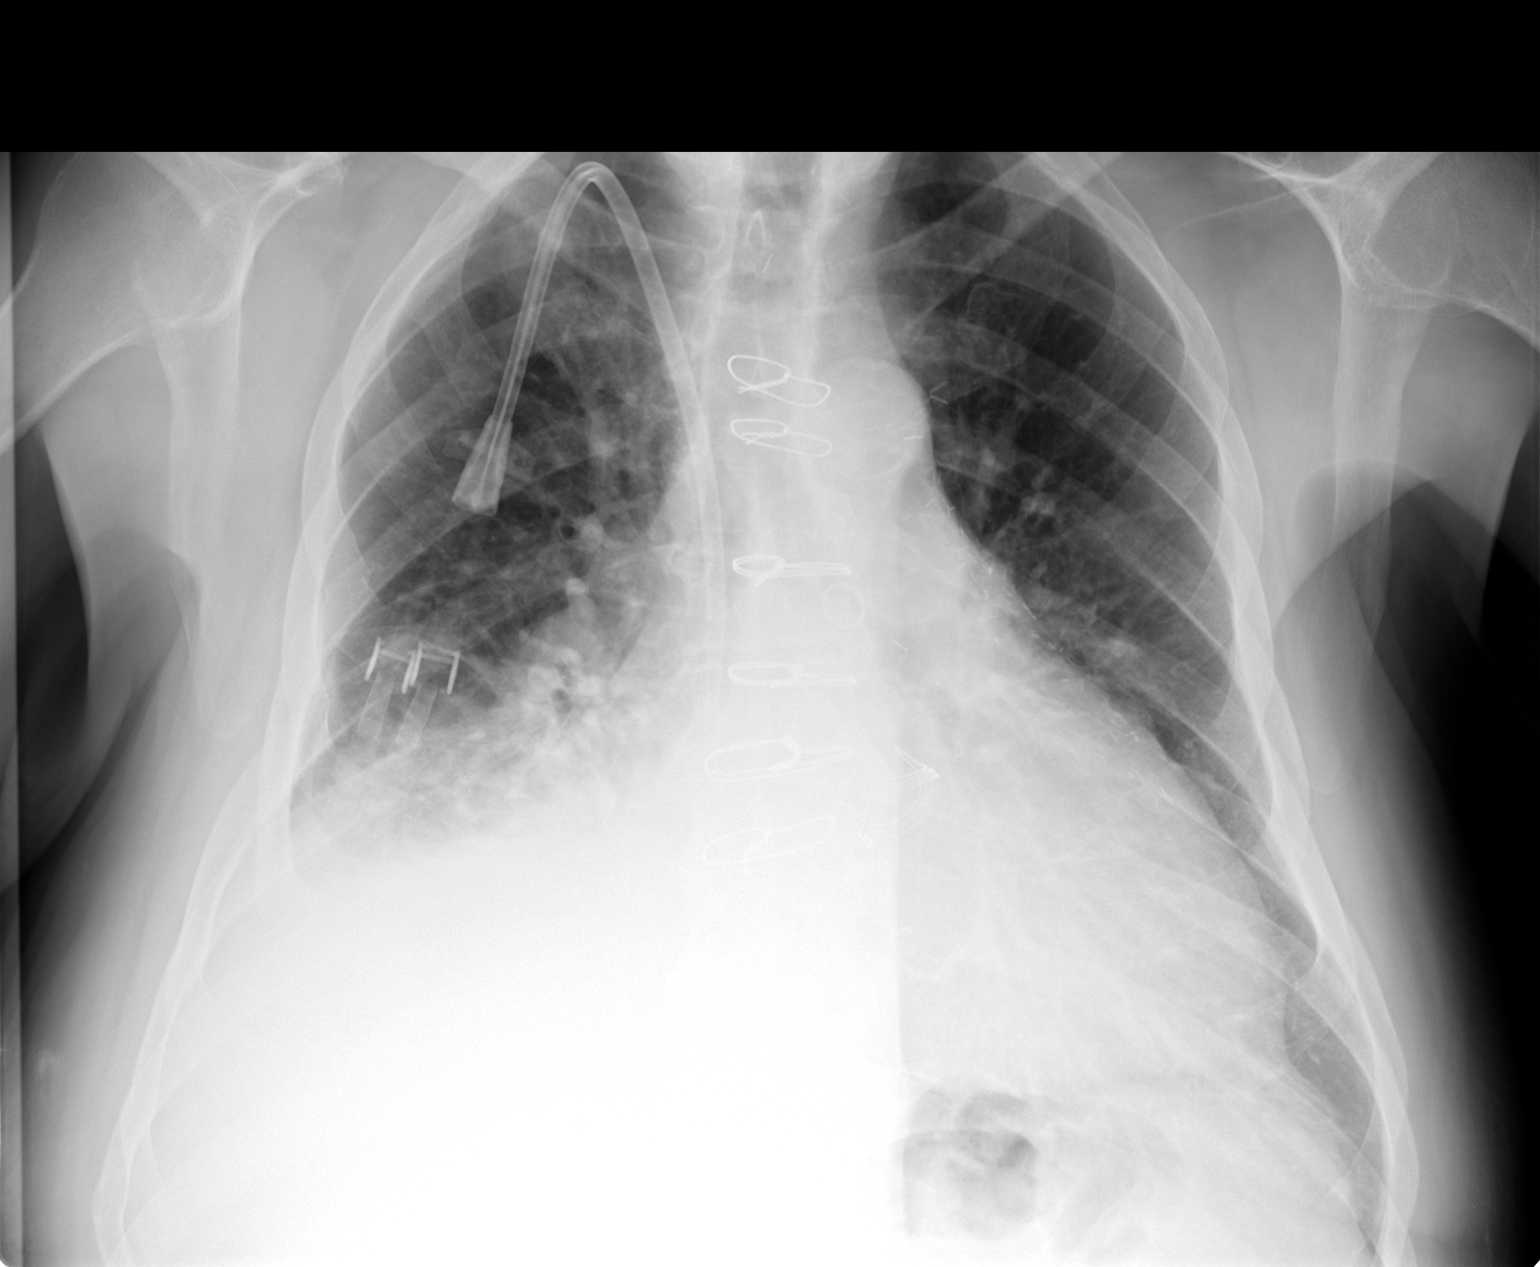

[view not recorded (2 of 2)]
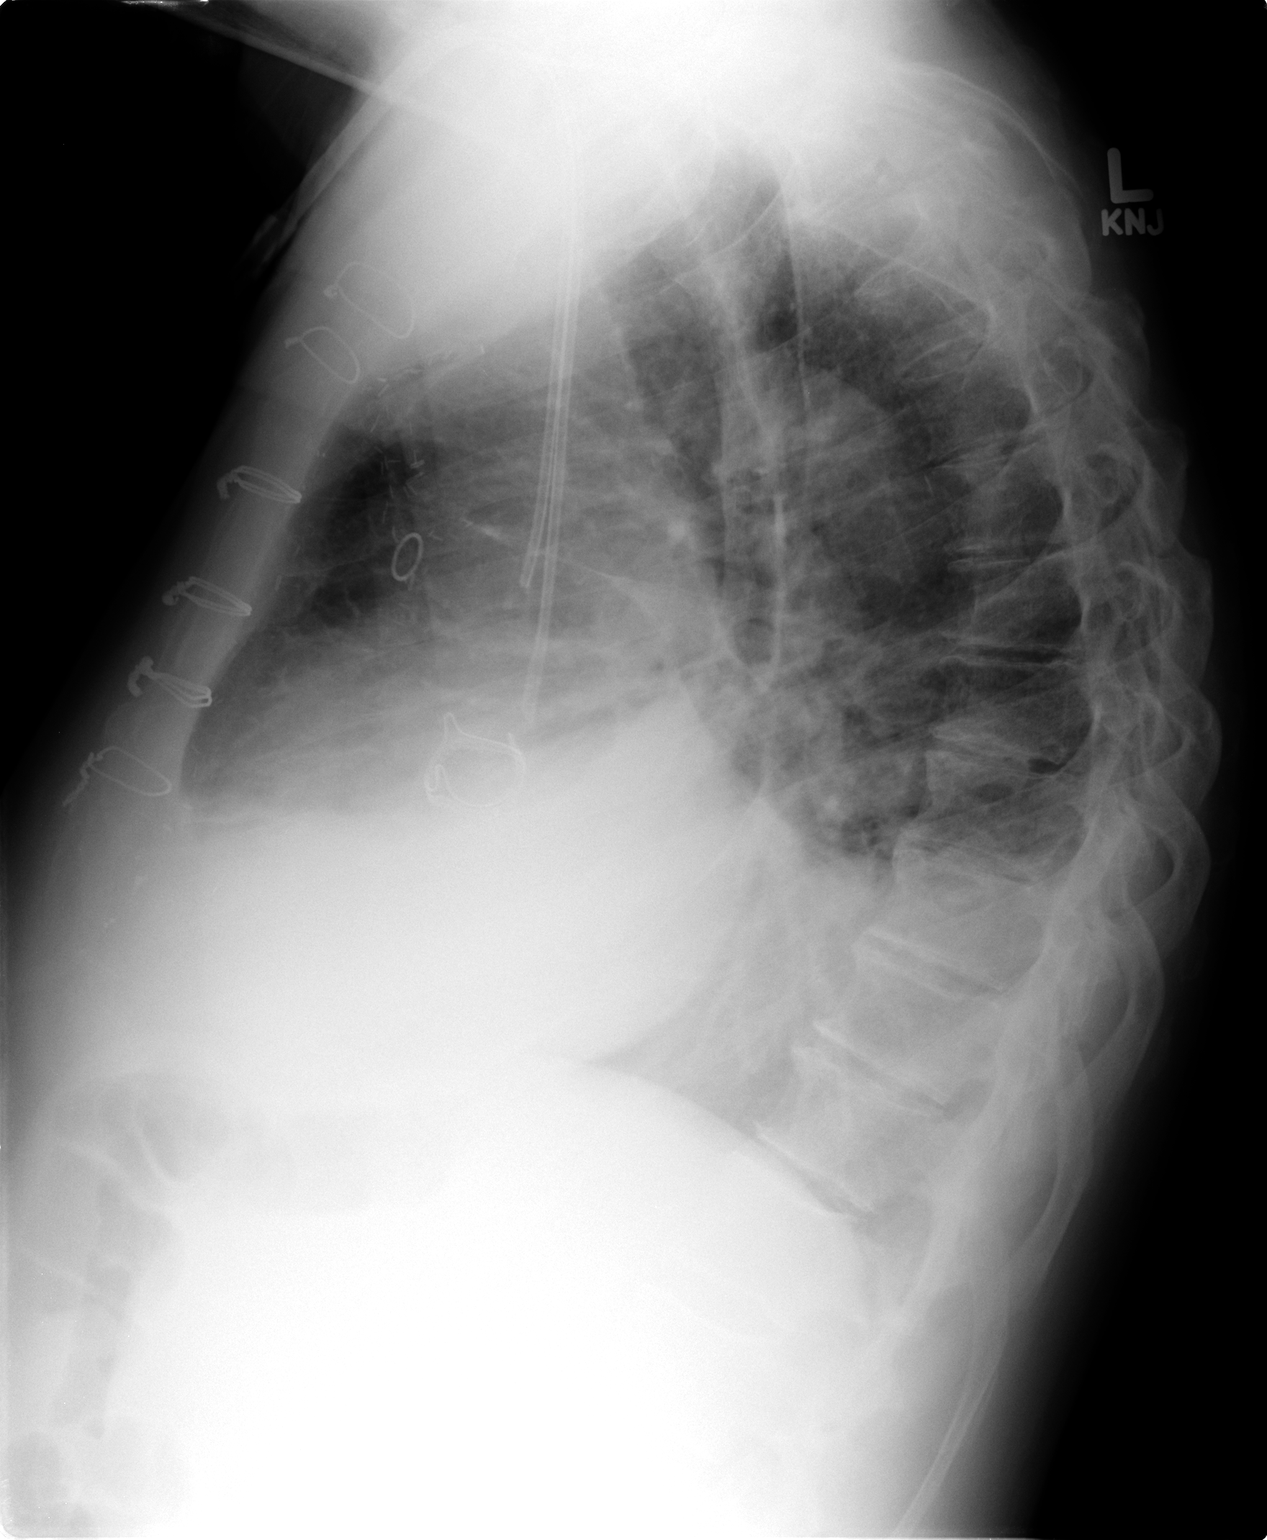

[2 of 2 positions shown; findings below may reference images not displayed]

FINDINGS: Cardiomegaly and interstitial edema are present.  There
is now a moderate sized right pleural effusion.  The central line
tips overlie the cavoatrial junction and right atrium, unchanged.
No pneumothorax.
IMPRESSION: Cardiomegaly, interstitial edema, and moderate sized right pleural
effusion.

## 2011-02-26 ENCOUNTER — Telehealth: Payer: Self-pay | Admitting: Cardiology

## 2011-02-26 NOTE — Telephone Encounter (Signed)
Pt states that since he has been in [place] his BP is staying a lot higher than it was here.  States it is checked 3 times a week at dialysis and has been running around 150/80 instead of the 120's/70's.  He reports taking all medications as prescribed.  I explained to pt that his BP is not at a critical level but that I will forward information to Dr Percival Spanish for his review and recommendations

## 2011-02-26 NOTE — Telephone Encounter (Signed)
Pt is in [place] until august. Pt blood pressure has 140/70 since be has been in [place]. Pt would like to talk to pam re his blood pressure meds.

## 2011-02-27 NOTE — Telephone Encounter (Signed)
Per Dr Percival Spanish pt should have nephrologist monitor and adjust BP medications.  He doesn't feel comfortable managing them when he cant evaluate him.

## 2011-02-28 ENCOUNTER — Telehealth: Payer: Self-pay | Admitting: Cardiology

## 2011-02-28 ENCOUNTER — Telehealth: Payer: Self-pay | Admitting: Pharmacist

## 2011-02-28 NOTE — Telephone Encounter (Signed)
He is going onto hospital at 1pm for transfusion, he has been given hemoccult cards to check stool he wanted Dr Percival Spanish to know what was going on, also he ask about his BP, per note from 5/8 advised pt Dr Percival Spanish would feel more comfortable if MDs in [facility] handle and made adjustments since he can not evaluate pt, pt is agreeable with this plan.

## 2011-02-28 NOTE — Telephone Encounter (Signed)
Pt called to report his INRs from [place].  States he has been having his INR checked every week and it has been consistently 2.1  The clinic has been increasing his dose and it is currently 2mg  daily except 2 1/2 mg on Sunday and Thursday.  He had lab work done at dialysis and his Hgb was ~8.  He has done a hemoccult card and is awaiting the results.  Will contact us when those are available.

## 2011-02-28 NOTE — Telephone Encounter (Signed)
Pt is in [place] and yesterday after HD he was given results from labs from last week and  red cells are 20&  HGB 8.2 they are sending him to hospital to have a blood transfusion today and he will also be seeing cvrr and his dose has been increased and his inr is still 2.1 he has a bleed in his body and taking the coumadin increase has made that worse

## 2011-03-05 NOTE — Assessment & Plan Note (Signed)
OFFICE VISIT   [name] [name], [name] [name]  DOB:  [birth year]                                       08/02/2010  [id number]   The patient returns for followup today.  He had a right brachiocephalic  AV fistula placed on 04/24/2010.  He returns today for further followup.   He has some intermittent numbness in the right hand.  He has  intermittent coolness in the right hand as well.  This is unchanged from  his last office visit in August of 2011.  He has no history of  nonhealing ulcers in the hand.  He has no rest type pain in the hand.   PHYSICAL EXAM:  Blood pressure is 125/77 in the left arm, heart rate is  82 and regular.  Temperature is 98.2.  right upper extremity has an  easily palpable thrill in the fistula.  It is palpable all the way up  the arm.  He has a catheter in place on the right chest wall and this is  clean with no evidence of infection.  He has a 2+ right radial pulse.  Sensation is intact grossly on exam today.   On review of systems today he denies any shortness of breath.  He denies  any chest pain.   In summary, the patient now has a mature right brachiocephalic AV  fistula.  They will start to cannulate this this week.  He does have  very mild steal symptoms.  If this progressed to constant pain in his  hand or evidence of nonhealing wounds in his right hand we would  consider an intervention but for right now his symptoms are fairly mild  in nature and I believe tolerable.  He will follow up with [place] on an as  needed basis.     Jessy Oto. Fields, MD  Electronically Signed   CEF/MEDQ  D:  08/02/2010  T:  08/03/2010  Job:  [id number]   cc:   [facility]

## 2011-03-05 NOTE — Assessment & Plan Note (Signed)
[name] [name], [name] [name]                    MRN:          [id number]  DATE:06/07/2008                            DOB:          [birth year]    PRIMARY CARE PHYSICIAN:  Ishmael Holter. Sarajane Jews, MD.   REASON FOR PRESENTATION:  Evaluate the patient with aortic stenosis.   HISTORY OF PRESENT ILLNESS:  The patient is a pleasant 75 year old  gentleman with aortic stenosis.  At the last visit, he was referred  because his BNP was still elevated at an insurance physical.  He was  slightly more short of breath and had an increased heart murmur.  I did  perform an echocardiogram as he had had one since 2007.  This  demonstrated that his EF was 55%.  However, his aortic valve stenosis  had progressed.  His mean gradient was 38, the valve area was 0.85.  This was a distinct change from 2007.  There were no other significant  valvular abnormalities.  There was mild mitral regurgitation.  The left  atrium was slightly large.   Since that time, the patient has been on a diet to lose weight and has  actually lost 14 pounds by our scales.  With this, he is breathing  better.  He is actually much less short of breath than he was.  He can  climb a flight of stairs more easily.  He does not have any resting  shortness of breath.  He denies any PND or orthopnea.  He does not have  any chest discomfort, neck, or arm discomfort.  He does not have any  palpitations, presyncope, or syncope.   PAST MEDICAL HISTORY:  Aortic stenosis (moderate to severe),  nonobstructive coronary artery disease on catheterization 3-1/2 years  ago (last stress perfusion study was in 2006 with no evidence of  ischemia), mild carotid plaque, dyslipidemia, arthritis, gout, obesity,  right heel spur resection, and appendectomy.   ALLERGIES:  PENICILLIN caused a rash.   MEDICATIONS:  1. Folic acid.  2. Benicar 40 mg daily.  3. Aspirin 81 mg  daily.  4. Atenolol 50 mg b.i.d.  5. Multivitamin.  6. Lipitor 20 mg daily.  7. Furosemide 80 mg q.a.m. and 40 mg q.p.m.  8. Potassium 20 mEq b.i.d.  9. Glyburide and metformin 5/500 b.i.d.  10.Allopurinol 300 mg daily.   REVIEW OF SYSTEMS:  As stated in the HPI and otherwise negative for  other systems.   PHYSICAL EXAMINATION:  GENERAL:  The patient is in no distress.  VITAL SIGNS:  Blood pressure 150/90, heart rate 68 and regular, weight  266 pounds, and body mass index 35.  HEENT:  Eyes, unremarkable.  Pupils, equal, round, and reactive light.  Fundi not visualized.  Oral mucosa unremarkable.  NECK:  No jugular venous distention at 45 degrees and carotid upstroke  brisk and symmetric.  No bruits, no thyromegaly.  The exam is  compromised because of his thick neck.  LYMPHATICS:  No cervical, axillary, or inguinal adenopathy.  LUNGS:  Clear to auscultation bilaterally.  BACK:  No costovertebral  angle tenderness.  CHEST:  Unremarkable.  HEART:  PMI not displaced or sustained.  S1 and S2 within normal limits.  No S3, no S4, no clicks, no rubs, 2/6 apical systolic murmur radiating  slightly at the aortic outflow tract.  No diastolic murmurs.  ABDOMEN:  Morbidly obese, positive bowel sounds, normal in frequency and  pitch, no bruits, no rebound, no guarding, no midline pulsatile mass, no  organomegaly.  SKIN:  No rashes.  No nodules.  EXTREMITIES:  2+ pulses throughout, no edema.  No cyanosis or clubbing.  NEURO:  Oriented to person, place, and time, cranial nerves II-XII are  grossly intact, motor grossly intact.   ASSESSMENT AND PLAN:  1. Aortic stenosis.  This has progressed.  He is feeling better now as      he has lost weight.  At this point, I am going to check another      echocardiogram in December, which will be 6 months from the last      time.  If this is stable and he is having no symptoms, we might      watch this further.  He understands salt and fluid restriction.   He      understands the symptoms of syncope, chest pain, and dyspnea that      may occur.  We discussed the anatomy in detail.  2. Carotid plaque.  He will have carotid Doppler again in December      when he comes back for his echocardiogram.  3. Hypertension.  Blood pressure is well controlled, and he will      continue the medications as listed.  4. Obesity.  The patient has done well losing weight and I encouraged      more the same.  He is going to start exercising at the [facility] now.  5. Dyslipidemia.  He is followed by Dr. Sarajane Jews.  I suggested a goal LDL      less than 70 and an HDL greater than 40 based on his diabetes.  6. Followup.  I will see him back in December.     Minus Breeding, MD, [facility]  Electronically Signed    JH/MedQ  DD: 06/07/2008  DT: 06/08/2008  Job #: [id number]   cc:   Annie Main A. Sarajane Jews, MD

## 2011-03-05 NOTE — Assessment & Plan Note (Signed)
[place] OFFICE NOTE   NAME:[name], [name] SCHORSCH                    MRN:          [id number]  DATE:07/15/2007                            DOB:          [birth year]    PRIMARY CARE PHYSICIAN:  Dr. Alysia Penna.   REASON FOR PRESENTATION:  Evaluate patient with dyspnea, mildly reduced  ejection fraction.   HISTORY OF PRESENT ILLNESS:  The patient presents for followup of the  above.  He has been doing he says well since I last saw him.  He says he  is currently not describing any dyspnea.  He denies any PMD or  orthopnea.  He has not been having palpitations, presyncope or syncope.  He denies any chest pain.  He did have his lab work done recently by Dr.  Sarajane Jews.  He says he is watching his salt.  He weighs himself twice a week.   PAST MEDICAL HISTORY:  Mildly reduced ejection fraction (followup echo  demonstrated an EF of 55 to 60% with some mild to moderate aortic  stenosis), nonobstructive coronary disease 3-1/2 years ago (stress  perfusion study in September 2006 negative for any evidence of  ischemia), mild carotid plaque (39% preliminarily on Doppler today),  dyslipidemia, arthritis, obesity, right heel spur resection,  appendectomy.   ALLERGIES:  PENICILLIN causes rash.   MEDICATIONS:  1. Lasix 80 mg q.a.m. and 40 mg q.p.m.  2. Glyburide/Metformin 5/500 b.i.d.  3. Azor 10/40.  4. Aspirin 81 mg daily.  5. Potassium.  6. Multivitamin.  7. Folic acid.  8. Atenolol 50 mg b.i.d.  9. Lipitor 10 mg daily.   REVIEW OF SYSTEMS:  As stated in the HPI and, otherwise, negative for  other systems.   PHYSICAL EXAMINATION:  GENERAL:  The patient is in no distress.  VITAL SIGNS:  Blood pressure 140/89, heart rate 64 and regular, weight  270 pounds, body mass index 35.  HEENT:  Eyes unremarkable, pupils equal, round, and reactive to light,  fundi not visualized.  Oral mucosa unremarkable.  NECK:  No jugular venous  distention.  Waveform within normal limits.  Carotid upstroke brisk and symmetric.  Bilateral bruits, no thyromegaly.  LYMPHATICS:  No lymphadenopathy.  LUNGS:  Clear to auscultation bilaterally.  CHEST:  Unremarkable.  HEART:  PMI not displaced or sustained.  S1, S2 within normal limits.  No S3, no S4.  No clicks, no rubs, no murmurs.  ABDOMEN:  Obese, positive bowel sounds normal in frequency and pitch.  No bruits, no rebound, no guarding, no midline pulsatile mass, no  hepatomegaly, no splenomegaly.  SKIN:  No rashes, no nodules.  EXTREMITIES:  2+ pulses, no edema.   EKG:  Sinus rhythm, rate 63, left access deviation, intervals within  normal limits, lateral T wave inversions unchanged from previous EKG's.   ASSESSMENT/PLAN:  1. Dyspnea.  The patient's dyspnea is maybe slightly improved.  I do      believe he is watching his salt and fluid intake.  He is on a      higher dose of diuretic.  He understands the concept  of dry lungs,      wet kidneys.  He knows he needs to have his BMET checked      periodically.  I will check with Dr. Barbie Banner office to see what the      recent lab work showed.  He will continue the current regimen.  2. Obesity.  He understands the need to lose weight with diet and      exercise.  3. Carotid stenosis.  This was mild.  He will have it checked again in      two years.  4. Mildly reduced ejection fraction.  Will follow this clinically.  5. Aortic stenosis.  This is mild and not changed by physical.  This      can be followed clinically.  6. Followup.  Will see the patient back in one year or sooner if he      has any problems.     Minus Breeding, MD, [facility]  Electronically Signed    JH/MedQ  DD: 07/15/2007  DT: 07/16/2007  Job #: [id number]   cc:   Annie Main A. Sarajane Jews, MD

## 2011-03-05 NOTE — Assessment & Plan Note (Signed)
[place] OFFICE NOTE   NAME:[name] [name], [name] [name]                    MRN:          [id number]  DATE:09/23/2008                            DOB:          30-Apr-1936    PRIMARY PHYSICIAN:  Ishmael Holter. Sarajane Jews, MD.   REASON FOR PRESENTATION:  Evaluate the patient with aortic stenosis.   HISTORY OF PRESENT ILLNESS:  The patient returns for followup of the  above.  Since I last saw him, he says he feels great.  He says he is  breathing better than he has in a while.  He will get dyspneic if he  walks vigorously.  However, he says he has an incline in his yard and  sixteen stairs in his house, and he can climb up these without problems.  He is not describing any resting shortness of breath.  He denies any PND  or orthopnea.  He is not describing any chest discomfort, neck or arm  discomfort.  At the last visit, his aortic stenosis was more severe than  it had been previously.  However, he was again denying any symptoms.  I  decided to repeat it, and he did have an echocardiogram a couple of days  ago.   This suggested that his ejection fraction may be slightly lower than  previous at 45-50%.  His aortic size has not changed, in that it is  0.95.  The mean gradient is 24, which measures slightly lower.  Again,  the patient denies any of the symptoms as described above.   PAST MEDICAL HISTORY:  1. Aortic stenosis (moderate to severe).  2. Nonobstructive coronary disease, on previous catheterizations 3-1/2      years ago (last stress perfusion study was in 2006 with no evidence      of ischemia).  3. Mild carotid plaque.  4. Dyslipidemia.  5. Arthritis.  6. Gout.  7. Obesity.  8. Right heel spur resected.  9. Appendectomy.   ALLERGIES:  PENICILLIN caused rash.   MEDICATIONS:  1. Bystolic 10 mg daily.  2. TriCor 145 mg daily.  3. Colchicine 0.6 mg daily.  4. Allopurinol 300 mg daily.  5. Glyburide/metformin 5/500  b.i.d.  6. Klor-Con 20 mEq b.i.d.  7. Furosemide 80 mg q.a.m. and 40 mg q.p.m.  8. Lipitor 20 mg daily.  9. Multivitamin.  10.Aspirin 81 mg daily.  11.Benicar 40 mg daily.  12.Folic acid.   REVIEW OF SYSTEMS:  As stated in the HPI and otherwise negative for  other systems.   PHYSICAL EXAMINATION:  GENERAL:  The patient is in no distress.  VITAL SIGNS:  Blood pressure 136/85, heart rate 69 and irregular.  HEENT:  Eyelids unremarkable.  Pupils are equal, round, and reactive to  light.  Fundi not visualized.  Oral mucosa unremarkable.  NECK:  No jugular venous distention at 45 degrees.  Carotid upstroke  brisk and symmetric.  No bruits, no thyromegaly.  The exam was  compromised because of the size of his neck.  LYMPHATICS:  No obvious cervical, axillary, or inguinal adenopathy.  LUNGS:  Clear to auscultation  bilaterally.  BACK:  No costovertebral angle tenderness.  CHEST:  Unremarkable.  HEART:  PMI not displaced or sustained.  S1 and S2 within normal limits.  No S3, no S4.  A 2/6 apical systolic murmur radiating slightly at the  aortic outflow tract, no diastolic murmurs.  ABDOMEN:  Morbidly obese.  Positive bowel sounds.  Normal in frequency  and pitch.  No bruits.  No rebound.  No guarding.  No midline pulsatile  mass.  No hepatomegaly.  No splenomegaly.  SKIN:  No rashes, no nodules.  EXTREMITIES:  Pulses 2+ throughout.  No edema.  No cyanosis.  No  clubbing.  NEUROLOGIC:  Oriented to person, place, and time.  Cranial nerves II  through XII grossly intact.  Motor grossly intact.   EKG:  EKG, sinus rhythm, rate 71, left axis deviation, left ventricular  hypertrophy, QT prolonged and unchanged from previous, no acute ST-T  wave changes.   ASSESSMENT AND PLAN:  1. Aortic stenosis:  The patient's aortic stenosis seems to be about      the same.  I am concerned because the EF seems to be slightly      lower.  I will compare the echos to see if this is a real finding.      He  denies any symptoms, and I questioned him very aggressively on      this.  However, I still suspect that he is more symptomatic than he      is reporting.  He wants to avoid surgery at this point.  However, I      told him we are getting closer to this point.  I am going to repeat      an echocardiogram in 6 months.  He is going to try to lose weight      still in that time.  He knows will need surgery probably within the      next year.  2. Obesity:  Again, we discussed the need to lose weight with diet and      exercise.  3. Mild carotid plaque.  He had a carotid Doppler the other day, and      it demonstrated 0 to 39% bilateral stenosis.  We will follow up in      2 years.  4. Dyslipidemia:  His triglycerides were very elevated though his LDL      and HDL were reasonable.  He knows that is fat in his diet, and he      was off his diet.  He will be more vigilant about this.  5. Diabetes per Dr. Sarajane Jews.  6. Followup.  I will see the patient again in 6 months with the      echocardiogram.     Minus Breeding, MD, [facility]  Electronically Signed    JH/MedQ  DD: 09/23/2008  DT: 09/24/2008  Job #: [id number]   cc:   Annie Main A. Sarajane Jews, MD

## 2011-03-05 NOTE — Assessment & Plan Note (Signed)
[place] OFFICE NOTE   NAME:[name] [name], [name] [name]                    MRN:          [id number]  DATE:04/04/2008                            DOB:          1935-11-15    PRIMARY CARE PHYSICIAN:  Ishmael Holter. Sarajane Jews, MD.   REASON FOR PRESENTATION:  Evaluate the patient with dyspnea and aortic  stenosis.   HISTORY OF PRESENT ILLNESS:  The patient is now 75 years old.  He  returns for followup.  He said that since I last saw him, he did some  lab work for insurance and failed.  One of the problems was a BNP of  2968 with a normal being less than 400.  His hemoglobin A1c was 6.3.  His triglycerides were 324.  Other labs were actually within acceptable  limits.   The patient says he may be slightly more short of breath than he was  previously.  He does not do a lot.  He is limited by gout and knee pain.  With his limited level of activity, he will get short of breath.  He is  not describing any resting shortness of breath.  He does not have any  PND or orthopnea.  He has had no palpitation.  No presyncope or syncope.  He denies any chest discomfort, neck or arm discomfort.   PAST MEDICAL HISTORY:  1. Mildly reduced ejection fraction (followup echo 55-60% with mild-to-      moderate aortic stenosis).  2. Nonobstructive coronary artery disease on catheterization, 3-1/2      years ago (most recent stress perfusion study was 2006 with no      evidence of ischemia).  3. Mild carotid plaque (39%).  4. Dyslipidemia.  5. Arthritis.  6. Obesity.  7. Right heel spur resection.  8. Appendectomy.   ALLERGIES:  PENICILLIN causes a rash.   MEDICATIONS:  1. Folic acid 1 mg daily.  2. Lipitor 10 mg daily.  3. Benicar 40 mg daily.  4. Aspirin 81 mg daily.  5. Furosemide 60 mg daily.  6. Atenolol 50 mg b.i.d.  7. Nabumetone.  8. Potassium 10 mEq daily.  9. Multivitamin.  10.Glyburide.  11.Metformin 5/500 q.i.d.   REVIEW OF  SYSTEMS:  As stated in the HPI, and otherwise negative for  other systems.   PHYSICAL EXAMINATION:  GENERAL:  The patient is in no distress.  VITAL SIGNS: Blood pressure 157/92, heart rate is 70 and regular, weight  is 280 pounds, and body mass index is 37.  HEENT:  Eyes are unremarkable.  Pupils are equal, round, and reactive to  light.  Fundi not visualized.  Oral mucosa unremarkable.  NECK:  No jugular venous distention at 45 degrees.  Carotid upstrokes  are brisk and symmetric.  Bilateral soft carotid bruits.  No  thyromegaly.  LYMPHATICS:  No cervical, axillary, or inguinal adenopathy.  LUNGS:  Clear to auscultation bilaterally.  BACK:  No costovertebral angle tenderness.  CHEST:  Unremarkable.  HEART:  PMI not displaced or sustained.  S1 and S2 within normal limits.  No S3.  No S4.  No clicks.  No rubs.  A 2/6 apical systolic murmur  radiating slightly out the aortic outflow tract.  No diastolic murmurs.  ABDOMEN:  Morbidly obese.  Positive bowel sounds.  Normal in frequency  and pitch.  No bruits.  No rebound.  No guarding.  No midline pulsatile  mass.  No organomegaly.  SKIN:  No rashes.  No nodules.  EXTREMITIES:  Pulse 2+.  No edema.  No cyanosis.  No clubbing.  NEURO:  Oriented to person, place, and time.  Cranial nerves II-XII  grossly intact.  Motor grossly intact.   EKG:  Sinus rhythm, rate 69, left axis deviation, left anterior  fascicular block.  No acute ST-T wave changes.  No change from previous  EKGs.   ASSESSMENT AND PLAN:  1. Dyspnea.  The patient has had some progressive dyspnea.  His BNP      was quite elevated on recent blood work done on Mar 09, 2008.      Given this, I am going to repeat an echocardiogram to look at his      degree of aortic stenosis and ejection fraction.  Further      evaluation are based on these results.  He understands fluid and      salt restriction.  2. Carotid plaque.  He is due to have this followed up again in 2010.      We  will arrange this.  3. Hypertension.  Blood pressure is slightly elevated.  However, he      checks it at home and it is in the 130s.  He is going to keep an      eye on this and perhaps keep a blood pressure diary, so that      treatment can be based on his home readings.  He certainly needs      therapeutic lifestyle changes (TLC) such as obesity in order to      bring his blood pressure down.  4. Obesity.  He understands the need to lose weight with diet and      exercise.  Hopefully, I will be able to clear him to participate in      an exercise regimen at the [facility].  5. Dyslipidemia per Dr. Sarajane Jews.  His goal LDL is less than 70 and HDL is      greater than 40.  6. Gout and knee pain.  He is due to see an Orthopedist soon.  7. Follow up.  I will see him back in 6 months or sooner if needed.     Minus Breeding, MD, [facility]  Electronically Signed    JH/MedQ  DD: 04/04/2008  DT: 04/05/2008  Job #: [id number]   cc:   Annie Main A. Sarajane Jews, MD

## 2011-03-05 NOTE — Assessment & Plan Note (Signed)
OFFICE VISIT   [name] [name], [name] [name]  DOB:  08-09-1936                                        May 14, 2010  CHART #:  [id number]   REASON FOR OFFICE VISIT:  Routine followup status post CABG x3, aortic  valve replacement by Dr. Roxan Hockey.   HISTORY OF PRESENT ILLNESS:  This is a 75 year old Caucasian male with a  past medical history significant for hypertension, type 2 adult onset  diabetes, hyperlipidemia, obesity, acute on chronic kidney disease, CHF,  who underwent aortic valve replacement for severe aortic stenosis and  CABG x3 for multivessel coronary artery disease.  The patient had a  fairly lengthy hospital course stay.  He did develop atrial fibrillation  postoperatively.  He was placed on amiodarone.  His beta-blocker that  had been started preoperatively was continued.  He was also placed on  Coumadin.  He remained in atrial fibrillation with a controlled  ventricular rate throughout his hospital course stay.  He also underwent  placement of a right brachiocephalic AV fistula by Dr. Oneida Alar on April 24, 2010.  He was discharged from [facility] in stable condition on April 25, 2010, to [facility].  The patient denies any fever, chills, chest pain, or  shortness of breath.  He is ambulating daily and is continuing to make  progress.   PHYSICAL EXAMINATION:  General:  This is a pleasant 75 year old  Caucasian male, who is in no acute distress, who is alert, oriented, and  cooperative.  Vital Signs:  His latest vital signs are as follows.  BP  112/68, heart rate 114, respiration rate 16, and O2 sat 92% on room air.  Cardiovascular:  Regular rate and rhythm.  Pulmonary:  Clear to  auscultation bilaterally.  No rales, wheezes, or rhonchi.  Slightly  diminished at the bases bilaterally.  Abdomen:  Soft and nontender.  Bowel sounds present.  Extremities:  Positive lower extremity edema  (decreased from previously).  Right lower extremity wound with a scab  that was removed.  Right forearm has a bruit and thrill present.  Chest:  Sternal wound is clean, dry, and well healed.  Sternum is solid.  Chest  tube suture is well healed.  Eschar removed from left chest tube site.   DIAGNOSTIC TESTS:  Chest x-ray done today showed no pneumothorax, mild  pulmonary vascular congestion with cardiomegaly, trace bilateral pleural  effusions.   IMPRESSION AND PLAN:  Overall, the patient continues to progress well  from heart surgery.  He has not yet seen a cardiologist in followup.  An  appointment has been obtained for May 15, 2010, with [facility]  Cardiology.  It should be noted that the patient, as previously stated,  remained in atrial fibrillation with a controlled ventricular rate  during his hospital course stay.  He appeared to be in sinus rhythm at  today's office visit.  An EKG done showed sinus rhythm with probable  PACs.  The patient is currently on Lopressor 25 mg p.o. b.i.d.  He does  remain tachycardic with his heart rates in the 100s to occasional 120s.  He had been tachycardic postoperative in the hospital as well.  He did  not tolerate titration of his beta-blocker as he became hypotensive.  The patient may need to have his Lopressor increased to 25 mg p.o.  t.i.d.  for better heart rate control, but we will defer this to  Cardiology as he has an appointment in followup tomorrow.  The patient  is also currently taking amiodarone 200 mg p.o. 2 times daily as well as  Coumadin.  The patient is having his PT and INR checked at [facility] with  a Coumadin dosage adjusted accordingly.  The patient was instructed he  should not begin driving to having been seen by the cardiologist.  When  he does begin driving, he is not to be on any narcotics and he is to  only drive short distances (20 minutes or less during the day).  He may  gradually increase his duration as tolerates over the next couple of  weeks.  The patient was also instructed to continue  with sternal  precautions for at least 2-4 more weeks (i.e., no lifting more than 10  pounds).  Regarding cardiac rehab, the patient was instructed he is  surgically stable, to be able to do cardiac rehab, however, he should  not attend until being seen by the cardiologist.  At the patient's  request, he will return to see Dr. Roxan Hockey on June 12, 2010, and  prior to his office appointment, he will have a chest x-ray obtained.  The patient was instructed to call for any further questions, problems,  or concerns.  The patient was instructed he is to call Dr. Oneida Alar'  office to have a followup appointment (status post brachiocephalic AV  fistula).   Revonda Standard Roxan Hockey, M.D.  Electronically Signed   DZ/MEDQ  D:  05/14/2010  T:  05/15/2010  Job:  [id number]   cc:   Minus Breeding, MD, [facility]

## 2011-03-05 NOTE — Assessment & Plan Note (Signed)
OFFICE VISIT   [name] [name], [name] [name]  DOB:  06/20/1936                                        May 14, 2010  CHART #:  [id number]   ADDENDUM.   VITAL SIGNS:  There was a systolic murmur appreciated (probable grade  1).   Also in addition, the patient while at [place] has been placed on  torsemide 100 mg p.o. daily.  He had not been on any diuretics  postoperatively in part secondary to his acute on chronic kidney  disease.   Lars Pinks, PA   DZ/MEDQ  D:  05/14/2010  T:  05/15/2010  Job:  [id number]   cc:   Minus Breeding, MD, [place] Olevia Perches, MD, [facility]

## 2011-03-05 NOTE — Assessment & Plan Note (Signed)
OFFICE VISIT   KMARI, PELL  DOB:  [birth year]                                        September 04, 2010  CHART #:  [id number]   HISTORY:  The patient is a 75 year old gentleman who had aortic valve  replacement and coronary bypass grafting back in June.  He had gone into  renal failure preoperatively and that persisted postoperatively and he  has been on dialysis since his hospitalization.  He was at cardiac rehab  about 2 weeks ago on August 21, 2010.  I guess they noticed decrease in  his oxygen saturations, and on auscultation, he had diminished breath  sounds at the right base.  A chest x-ray was done, which showed a right  pleural effusion.  Of note, he is in atrial fibrillation and is on  Coumadin.  He is actually scheduled for transesophageal echocardiogram  and DC cardioversion this Thursday.  We set him up for an appointment to  see him back and assess his effusion and see if he needed a  thoracentesis.  He says that in the meantime, he had them take a little  more fluid off on dialysis to get him down to a dry weight of 87 kg.  He  was dialyzed yesterday.  He has not had any trouble with his breathing  and he was not really noticing any trouble with his breathing, cough, or  shortness of breath at the time the initial x-ray showed the effusion.   PHYSICAL EXAMINATION:  General:  The patient is a 75 year old gentleman,  in no acute distress.  Vital Signs:  His blood pressure is 125/71, pulse  is 50 and slightly irregular, respirations are 16, and his oxygen  saturation is 97% on room air.  Lungs:  Very minimally decreased breath  sounds at the right base, otherwise are clear.  There is no rales or  wheezing.  Cardiac:  Irregular and slow, but no rub.  There is a faint  murmur.   LABORATORY DATA:  His INR today is 3.1 with a PT of 21.5.  His chest x-  ray shows resolution of his right pleural effusion, it is near-complete.  There may be 5%  of the effusion that was present on the 1st, is still  present at this point in time.   IMPRESSION:  The patient is a 75 year old gentleman status post aortic  valve replacement-coronary artery bypass graft.  He had a right pleural  effusion most likely related to some mild failure in the setting of  renal failure and hemodialysis.  With additional fluid being taken off  with dialysis, the effusion has resolved, but does have some  interstitial edema.  There is not enough fluid there currently to cause  concern or to attempt thoracentesis.  I would be happy to see him back  anytime if I could be of any further assistance with his care.   Revonda Standard Roxan Hockey, M.D.  Electronically Signed   SCH/MEDQ  D:  09/04/2010  T:  09/05/2010  Job:  [id number]   cc:   Minus Breeding, MD, [place] A. Sarajane Jews, MD

## 2011-03-05 NOTE — Assessment & Plan Note (Signed)
OFFICE VISIT   [name], [name]  DOB:  1936/05/20                                        July 12, 2010  CHART #:  [id number]   REASON FOR VISIT:  Followup after AVR/CABG.   HISTORY:  The patient is a 75 year old gentleman who had coronary bypass  grafting x3 and an aortic valve replacement with a pericardial valve on  April 04, 2010.  His postoperative course was complicated by atrial  fibrillation and acute-on-chronic renal failure, ultimately requiring  dialysis.  He started on dialysis while in the hospital through a  PermCath.  He also had a right arm AV cortex placed.  He is still being  dialyzed with the PermCath 3 times weekly.  He states that he has been  getting stronger.  He has lost about 80 pounds since his surgery,  primarily fluid, but he is having improved exercise tolerance.  He has  been seen at [facility] about the possibility of getting a kidney  transplant.  He did have a TIA on June 14, 2010, and apparently he was  treated at [facility]  His INR at that time was 1.9 and he is currently  on Coumadin 2 mg daily except Monday and Friday when he takes 1 mg; in  his last check, it was 1.9 again and he was told to take an extra 1 mg  on that day.  He is that scheduled to be checked again when he sees Dr.  Percival Spanish on July 26, 2010.  He has been doing rehab at home, has now  completed.  He is going to start cardiac rehab once he gets his referral  straightened out.   CURRENT MEDICATIONS:  1. Coumadin 2 mg daily except Monday and Friday 1 mg.  2. Zyloprim 300 mg daily.  3. Vol-Care 1 tablet daily.  4. Amiodarone 200 mg daily.  5. Levothyroxine 25 mcg daily.  6. Digoxin 125 mcg daily.  7. Metoprolol 50 mg daily.  8. Simvastatin 20 mg daily.  9. Alprazolam 0.5 mg nightly.  10.Tramadol 50 mg nightly.  11.Oxycodone 5 mg nightly and as needed.  12.Colchicine 0.6 mg as needed.  13.Lantus insulin as needed.   PHYSICAL EXAMINATION:   General:  The patient is a 75 year old gentleman,  in no acute distress.  Vital Signs:  Blood pressure is 123/73, pulse is  74 and irregular, respirations are 16, and oxygen saturation is 98% on  room air.  Lungs:  Clear with equal breath sounds.  Neurologic:  There  is no obvious focal neurologic deficits.  Cardiac:  Slightly irregular.  There is no murmur.  Chest:  Sternum is stable.  Sternal incision is  clean, dry, and intact.  Extremities:  Leg incisions are he well healed.  There is no peripheral edema.  He has a PermCath in place in the right  anterior chest.   DIAGNOSTIC TESTS:  Chest x-ray shows good aeration of the lungs  bilaterally with resolved pleural effusions.   IMPRESSION:  The patient is now about 3 months out from coronary bypass  grafting and aortic valve replacement.  He is doing well at this point  in time.  He is on dialysis and hopefully they can start using his right  arm Gore-Tex graft soon, so that the PermCath can be removed.  From a  cardiac standpoint, he is very stable.  It does appear that he is still  in atrial fibrillation.  He is on amiodarone and digoxin as well as  metoprolol and he is anticoagulated.  He did have a mini stroke, but no  residual symptoms, but he was near therapeutic on his Coumadin at the  time that happened.  His Coumadin is being managed through the [facility].  He has a followup appointment with Dr. Percival Spanish on  July 26, 2010.  I did encourage him to go ahead with cardiac rehab.  I  would be happy to see the patient back anytime if I can be of any  further assistance with his care.   Revonda Standard Roxan Hockey, M.D.  Electronically Signed   SCH/MEDQ  D:  07/12/2010  T:  07/13/2010  Job:  [id number]   cc:   Minus Breeding, MD, [place] A. Sarajane Jews, MD  [facility]

## 2011-03-05 NOTE — Assessment & Plan Note (Signed)
OFFICE VISIT   [name] [name], [name] [name]  DOB:  [birth year]                                       09/20/2010  [id number]   The patient returns for followup today.  He had a right brachiocephalic  AV fistula placed in July of 2011.  He returned today to ask some  questions regarding his fistula.  He wondered whether or not he could  resume cardiac rehab and whether or not he would be scheduled for his  catheter removal soon.   His catheter was previously placed by Dr. Kathlene Cote with radiology.   He states that he still has some intermittent numbness and tingling and  coolness in his right hand.  This has not really changed since the last  time I saw him although he states that the numbness may be slightly  improved.   On physical exam today blood pressure is 147/66 in the left arm, heart  rate is 51 and regular.  Temperature is 97.8.  He has an easily palpable  thrill in the right upper arm AV fistula.   In summary, the patient has a mild steal in the right upper arm fistula.  However, these symptoms are not very bothersome to him and these are not  debilitating.  I believe the best option at this point would be to  continue to observe this over time.  If his symptoms become worse we  could address it at some point in the future.  He states they are  cannulating his fistula successfully without problem.  We will leave it  at the kidney center's discretion when they want the catheter removed by  radiology.  He will follow with me on an as-needed basis.     Jessy Oto. Fields, MD  Electronically Signed   CEF/MEDQ  D:  09/20/2010  T:  09/21/2010  Job:  [id number]   cc:   [facility]

## 2011-03-05 NOTE — Assessment & Plan Note (Signed)
OFFICE VISIT   [name] [name], [name] [name]  DOB:  1936/01/07                                       06/07/2010  [id number]   The patient returns for followup today.  He underwent placement of a  right brachiocephalic AV fistula on July 5.  He states that he does have  some intermittent mild numbness and tingling in his right hand.  He also  has some coolness in the right hand.  He denies any achiness in the  right hand.  It is not continuous in nature.  It does not seem to get  worse with dialysis.  He has not had any nonhealing ulcers on the hand.   PHYSICAL EXAM:  Blood pressure is 135/74 in the left arm, heart rate is  58 and regular.  Temperature is 98.4.  He has an easily palpable thrill  and audible bruit in the fistula.  It is maturing nicely.   In summary, the patient has a maturing fistula in the right arm.  It  should be ready in about two more months.  He will follow up with me in  October to recheck this one last time before we start to cannulate it.  He does have mild steal symptoms in the right hand.  As long as these do  not progress I believe these should be tolerable for him.  If they  become worse over time he will see me sooner.     Jessy Oto. Fields, MD  Electronically Signed   CEF/MEDQ  D:  06/07/2010  T:  06/08/2010  Job:  [id number]   cc:   [facility]

## 2011-03-08 NOTE — Discharge Summary (Signed)
NAME:  [name] [name], [name] [name]             ACCOUNT NO.:  [id number]   MEDICAL RECORD NO.:  [id number]          PATIENT TYPE:  INP   LOCATION:  2023                         FACILITY:  [facility]   PHYSICIAN:  Gwendolyn Grant, M.D. LHCDATE OF BIRTH:  Mar 01, 1936   DATE OF ADMISSION:  07/05/2005  DATE OF DISCHARGE:  07/06/2005                                 DISCHARGE SUMMARY   DISCHARGE DIAGNOSES:  1.  Atypical chest pain, neck pain; rule out myocardial infarction negative,      telemetry negative, status post cardiac evaluation for further      outpatient stratification.  2.  Acute on chronic low back pain with history of kidney stones and      question reports of gross hematuria.  Outpatient workup ongoing per      patient, defer to primary M.D. and will return in followup on Monday for      further evaluation of possible underlying renal abnormality, stones,      etc.  3.  Dyslipidemia.  4.  Type 2 diabetes.  5.  Hypertension.  6.  Chronic osteoarthritis of neck, back and knees.   DISCHARGE MEDICATIONS:  Are as prior to admission, and include:  1.  Lipitor 10 mg p.o. q.h.s.  2.  Folic acid 1 mg daily.  3.  Cozaar 100 mg daily.  4.  Lasix 40 mg p.o. q.a.m. and 20 mg p.o. q.p.m.  5.  Atenolol 50 mg p.o. b.i.d.  6.  K-Dur 10 mEq p.o. daily.  7.  Ultracet 37.5/325 p.r.n. pain.  8.  Aspirin 81 mg daily.  9.  Relafen 750 mg b.i.d.  10. Glucovance 5/500 two tablets p.o. b.i.d.  11. Multivitamin daily.   Patient is also given a prescription of Vicodin 5/500, dispense 20, to take  1/2 to 2 tablets every 4 hours as needed for low back pain until further  evaluation.   CONSULTATIONS:  [facility].   HOSPITAL FOLLOWUP:  Will be with Dr. Percival Spanish with [facility], who  will notify patient on Monday to schedule carotid Dopplers as well as an  adenosine Myoview/Cardiolite for further risk stratification.  Patient will  also be contacted by his primary care physician's  office, Dr. Sarajane Jews, regarding  followup for further evaluation of potential kidney issues/hematuria which  has not been documented during this hospitalization either by patient's  __________ or UA.   HOSPITAL COURSE BY PROBLEM:  1.  Atypical chest pain.  The patient is a 75 year old gentleman with      multiple cardiac risk factors who presented to primary care physician's      office for further evaluation of low back pain and questionable      hematuria.  Patient says from this complaint he also mentioned neck      pain, jaw pain for which the primary physician was concerned for      coronary disease.  He has a previous history of mild coronary disease by      cath 2003 with multiple risk factors.  He was admitted to the hospital      for further cardiac evaluation  and rule out.  He was seen in      consultation by Dr. Wilhemina Cash, [facility], who agreed with      overnight observation on telemetry, rule out enzymes.  The suspected      pain was atypical.  As such, when patient's workup was negative      Cardiology felt he was suitable for further outpatient evaluation with      an adenosine Myoview as well as carotids to look for further cardiac      abnormalities given his history.  However, patient's discomfort is most      likely due to his chronic osteoarthritis and maybe renal issues; please      see problem number next.  2.  Low back pain with question hematuria.  Again, no UA was done during      this hospitalization and patient says this has been ongoing for at least      a month.  Offer for inpatient evaluation including CT and further      urologic workup has been declined by patient as he says that he would      prefer to see Dr. Sarajane Jews and continue the workup which he feels has been      begun now.  Further evaluation for reported hematuria and possible      history of renal stones, etc. to be determined by primary M.D. and/or      urology referral.  3.  Other medical  issues.  Patient has type 2 diabetes, hypertension and      dyslipidemia as prior to admission and no changes are made in his      medical regimen during this hospitalization.  Of note, his A1c is 5.9,      TSH is normal and LFTs are within normal limits.  Continue medications      as prior to admission without adjustment at this time.      Gwendolyn Grant, M.D. [facility]  Electronically Signed     VL/MEDQ  D:  07/06/2005  T:  07/07/2005  Job:  [id number]

## 2011-03-08 NOTE — Assessment & Plan Note (Signed)
[place] OFFICE NOTE   NAME:[name] [name], [name] [name]                    MRN:          [id number]  DATE:12/16/2006                            DOB:          1936-08-07    PRIMARY CARE PHYSICIAN:  Ishmael Holter. Sarajane Jews, M.D.   REASON FOR PRESENTATION:  Evaluate patient with dyspnea.   HISTORY OF PRESENT ILLNESS:  The patient presents for follow up.  He has  mild aortic stenosis that we are going to follow clinically.  He has a  well preserved ejection fraction.  He continued to have dyspnea.  The  etiology of this is not entirely clear.  I did get a BNP at his last  visit.  It was 534.  This suggests some elevated end diastolic pressure  suggesting some diastolic dysfunction.  I brought him back to discuss  this.  He says his breathing has been better.  He has been losing weight  and dieting and has been very successful at this so far.  He still gets  short of breath with moderate activity, but it has actually improved.  He is not having any resting shortness of breath.  Denies any PND or  orthopnea.  He has not been having any chest pain.  Denies any  palpitations, presyncope or syncope.  He has had no swelling.  Blood  pressure has been better controlled since he was switched for Azor (he  does not know the dose).   PAST MEDICAL HISTORY:  1. Mildly reduced ejection fraction (follow up echo demonstrated an EF      of 55-60% with some mild the moderate aortic stenosis).  2. Nonobstructive pulmonary disease 3-1/2 years ago (stress perfusion      study in September of 2006 negative for any evidence of ischemia).  3. Mild carotid plaquing.  4. Dyslipidemia.  5. Arthritis.  6. Morbid obesity.  7. Right heel spur resected.  8. Appendectomy.   ALLERGIES:  PENICILLIN caused rash.   MEDICATIONS:  1. Lipitor 10 mg daily.  2. Atenolol 50 mg b.i.d.  3. Folic acid.  4. Multivitamin.  5. Potassium 20 mEq daily.  6. Aspirin  81 mg daily.  7. Nabumetone.  8. Glyburide.  9. Metformin 5/500 four times daily.  10.Azor (the patient does not know the dose).  11.Lasix 40 mg b.i.d.   REVIEW OF SYSTEMS:  As stated in the HPI and otherwise negative for  other systems.   PHYSICAL EXAMINATION:  GENERAL:  The patient is in no distress.  VITAL SIGNS:  Blood pressure 124/78, heart rate 75 and regular, weight  267 pounds, body mass index 35.  HEENT:  Eyes unremarkable.  Pupils equal, round and reactive to light.  Fundi not visualized.  Oral mucosa unremarkable.  NECK:  No jugular venous distention.  Waveform within normal limits.  Carotid upstroke within normal limits.  Carotid upstroke brisk and  symmetric.  No bruits or thyromegaly.  LYMPHATICS:  No cervical, axillary or inguinal adenopathy.  LUNGS:  Clear to auscultation bilaterally.  BACK:  No costovertebral angle tenderness.  CHEST:  Unremarkable.  HEART:  PMI not displaced or sustained.  S1 and S2 within normal limits.  No S3, no S4.  A 2/6 apical systolic murmur radiating at the aortic  outflow tract and mid peaking.  No diastolic murmurs.  ABDOMEN:  Obese.  Positive bowel sounds.  Normal in frequency and pitch.  No bruits, rebound or guarding.  No midline pulsatile mass.  No  organomegaly.  SKIN:  No rashes, no nodules.  EXTREMITIES:  There were 2+ pulses.  Trace edema.   ASSESSMENT AND PLAN:  1. Dyspnea.  The patient's dyspnea may well have some component of      diastolic heart failure.  We discussed at length (greater than half      hour of this appointment) salt restriction and fluid restriction.      We discussed the need to have dry lungs and wet kidneys.  He      understands this is a balance and that he needs frequent blood      draws.  We discussed fluid restriction and salt restriction.  He      now appears to have excellent blood control.  He understands this      is a key.  He also understands weight loss will contribute overall      to his  well being.  2. Hypertension as above.  3. Obesity.  He is following the [place].  4. Follow up - I will see him back in 6 months to follow up the aortic      stenosis, as well as the diastolic heart failure.     Minus Breeding, MD, [facility]  Electronically Signed    JH/MedQ  DD: 12/16/2006  DT: 12/16/2006  Job #: [id number]   cc:   [place] A. Sarajane Jews, MD

## 2011-03-08 NOTE — Assessment & Plan Note (Signed)
[facility]                            CARDIOLOGY OFFICE NOTE   NAME:[name], [name] COMPANION                    MRN:          [id number]  DATE:10/30/2006                            DOB:          1936/08/14    The primary is [name] Main A. Sarajane Jews, MD.   REASON FOR PRESENTATION:  Evaluate patient with dyspnea.   HISTORY OF PRESENT ILLNESS:  The patient returns for follow-up of the  above.  Since I last saw him he has been seen by Dr. Melvyn [name] and treated  for a possible pneumonia.  He still gets dyspneic with moderate exertion  such as walking a moderate distance on level ground.  He does not  describe any resting shortness of breath and has no PND or orthopnea.  He has no chest discomfort, neck discomfort, arm discomfort, activity-  induced nausea or vomiting or excessive diaphoresis.  He has had no  palpitations, presyncope or syncope.  He did have some lower extremity  swelling and was sent over to get some venous Dopplers recently.  These  preliminarily demonstrate no clot or evidence of overt venous  incompetence.   PAST MEDICAL HISTORY:  1. Mildly reduced ejection fraction (follow-up echo demonstrated the      EF to be 55-60% with some mild to moderate aortic stenosis).  2. Nonobstructive coronary disease on cardiac catheterization 3-1/2      years ago (stress perfusion study September 2006 negative for any      evidence of ischemia).  3. Mild carotid plaquing.  4. Dyslipidemia.  5. Arthritis.  6. Morbid obesity.  7. Right heel spur resected.  8. Appendectomy.   ALLERGIES:  PENICILLIN caused a rash.   MEDICATIONS:  1. Lipitor 10 mg daily.  2. Atenolol 50 mg b.i.d.  3. Folic acid.  4. Multivitamin.  5. Potassium 10 mEq daily.  6. Aspirin 81 mg daily.  7. Nabumetone.  8. Glyburide/metformin 5/500 mg four tablets daily.  9. Azor (the patient does not know the dose).  10.Lasix 40 mg q.a.m. and 20 mg q.p.m.   REVIEW OF SYSTEMS:  As stated in the HPI  and otherwise negative for  other systems.   PHYSICAL EXAMINATION:  GENERAL:  The patient is in no distress.  VITAL SIGNS:  Blood pressure 162/85, heart rate 67 and regular.  Weight  284 pounds, body mass index 37.  HEENT:  Eyelids unremarkable.  Pupils equal, round, and reactive to  light.  Fundi not visualized.  Oral mucosa normal.  NECK:  No jugular venous distention at 45 degrees, carotid upstroke  brisk and symmetric, no bruits, no thyromegaly.  LYMPHATIC:  No cervical, axillary or inguinal adenopathy.  LUNGS:  Clear to auscultation bilaterally.  BACK:  No costovertebral angle tenderness.  CHEST:  Unremarkable.  HEART:  PMI not displaced or sustained, S1 and S2 within normal limits,  no S3, no S4, a 2/6 apical systolic murmur radiating out the aortic  outflow tract and midpeaking, no diastolic murmurs.  ABDOMEN:  Obese, positive bowel sounds, normal in frequency and pitch,  no bruits, no rebound, no guarding, no midline pulsatile mass,  no  hepatomegaly, no splenomegaly.  SKIN:  No rashes, no nodules.  EXTREMITIES:  2+ pulses, no cyanosis, no clubbing, trace bilateral lower  extremity edema.  NEUROLOGIC:  Oriented to person, place and time.  Cranial nerves II-XII  grossly intact.  Motor grossly intact.   EKG:  Sinus rhythm, rate 67, left axis deviation, left anterior  fascicular block, poor anterior R-wave progression, no acute ST-T wave  changes.   ASSESSMENT AND PLAN:  1. Dyspnea.  The patient's predominant complaint continues to be      dyspnea.  He does not have overt evidence of diastolic heart      failure and he clearly does not have systolic heart failure.  He      only has mild to moderate aortic stenosis.  I doubt that these are      contributing.  I will get a BNP level to see if this is markedly      elevated.  However, I suspect this to be other than cardiac and      will continue the current medications.  2. Hypertension.  His blood pressure is elevated but he  had his      medications changed recently to include Azor, and this is being      followed by Dr. Sarajane [name].  3. Morbid obesity.  We discussed the need to lose weight, and I have      prescribed the [place].  4. Other.  I spent quite a bit of time (greater than a half hour this      morning) reviewing his labs.  This demonstrated some mild renal      insufficiency, which will be followed by Dr. Sarajane [name].  We also reviewed      the preliminary Doppler, and the final will be going to Dr. Sarajane [name].  I      answered all of his questions and discussed things with his wife as      well.  5. Follow-up.  We will see the patient back in 6 months, then probably      yearly thereafter.    Minus Breeding, MD, [facility]  Electronically Signed   JH/MedQ  DD: 10/30/2006  DT: 10/30/2006  Job #: [id number]   cc:   [name] Main A. Sarajane Jews, MD

## 2011-03-08 NOTE — Assessment & Plan Note (Signed)
[facility] OFFICE NOTE   NAME:[name] [name], [name] [name]                    MRN:          [id number]  DATE:05/13/2006                            DOB:          [birth year]    PRIMARY CARE PHYSICIAN:  Edsel Petrin. Dalbert Batman, MD   REASON FOR PRESENTATION:  Evaluate patient with murmur.   HISTORY OF PRESENT ILLNESS:  The patient presents for follow-up of the  above.  He has had some congestive heart failure.  He has a mildly reduced  ejection fraction of about 50%.  He has had some mild aortic sclerosis.  He  was hospitalized in September of last year.  In follow-up of this he had a  stress perfusion study which demonstrated no definite evidence of ischemia  or infarct but with mildly reduced ejection fraction.  He had a carotid that  demonstrated some mild plaque.   He has done relatively well from a cardiovascular standpoint.  He is limited  by some joint pains and his weight.  He has no significant limitations from  dyspnea.  He has no PND or orthopnea.  He has no palpitation, presyncope or  syncope.  He has had no further chest discomfort, neck discomfort, arm  discomfort, activity-induced nausea or vomiting or excessive diaphoresis.   PAST MEDICAL HISTORY:  1.  Mildly reduced ejection fraction, probable diastolic dysfunction.  2.  Hypertension x30 years.  3.  Nonobstructive coronary disease with catheterization approximately 3-1/2      years ago.  4.  Mild aortic sclerosis.  5.  Mild carotid plaquing.  6.  Hyperlipidemia.  7.  Arthritis.  8.  Right heel bone spur resected.  9.  Obesity.  10. Appendectomy.   ALLERGIES:  PENICILLIN.   MEDICATIONS:  1.  Lipitor 10 mg daily.  2.  Cozaar 100 mg a day.  3.  Atenolol 50 mg b.i.d.  4.  Folic acid.  5.  Multivitamin.  6.  Lasix 60 mg q.a.m. and 20 mg q.p.m.  7.  Aspirin 81 mg daily.  8.  Nabumetone.  9.  Glyburide/metformin 5/500 mg four daily.   REVIEW OF  SYSTEMS:  As stated in the HPI and otherwise negative for other  systems.   PHYSICAL EXAMINATION:  GENERAL:  The patient was in no distress.  VITAL SIGNS:  Blood pressure 134/68, heart rate 64 and regular, weight 289  pounds, body mass index 37.  HEENT:  Eyes unremarkable.  Pupils equal, round, and reactive to light.  Fundi not visualized.  NECK:  No jugular venous distention.  Wave form within normal limits.  Carotid upstroke brisk and symmetric.  Transmitted systolic murmur.  No  thyromegaly.  LYMPHATIC:  No cervical, axillary or inguinal adenopathy.  LUNGS:  Clear to auscultation bilaterally.  BACK:  No costovertebral angle tenderness.  CHEST:  Unremarkable.  CARDIAC:  PMI not displaced or sustained, S1 and S2 within normal limits, no  S3, no S4, a 3/6 apical systolic murmur radiating slightly out the aortic  outflow tract and early peaking, no diastolic murmur.  ABDOMEN:  Obese, positive bowel  sounds, normal in frequency and pitch, no  bruits, no rebound, no guarding, no midline pulsatile mass, no organomegaly.  SKIN:  No rashes, no nodules.  EXTREMITIES:  2+ pulses throughout.  No edema, cyanosis or clubbing.  NEUROLOGIC:  Oriented to person, place and time.  Cranial nerves II-XII  grossly intact.  Motor grossly intact.   EKG:  Rate 64, sinus rhythm, poor anterior R-wave progression, left axis  deviation, premature ectopic complex, no acute ST-T wave changes,  nonspecific high lateral T-wave changes.   ASSESSMENT AND PLAN:  1.  Murmur.  The patient has an aortic sclerosis murmur versus mild      stenosis.  It may be slightly louder than previously recorded.  He has      also had a mildly reduced ejection fraction.  I am going to get an      echocardiogram to follow up on this.  2.  Heart failure.  The patient had some mild left ventricular dysfunction,      presumed nonischemic, with probable diastolic dysfunction.  We are      managing this with blood pressure control and  hopefully control of his      weight and increased activity.  I gave him a prescription for exercise.  3.  Obesity.  He understands he needs to lose weight with diet and exercise.  4.  Carotid stenosis.  The patient is due to have this followed up in 2      years with another ultrasound.  5.  Risk reduction.  The patient is having his lipids followed by Dr. Sarajane Jews.      I would suggest an LDL less than 100 and would even consider in the 70s      given his mild plaque and his      diabetes.  6.  Follow-up.  I will see him back in 1 year or sooner if needed.                               Minus Breeding, MD, [facility]    JH/MedQ  DD:  05/13/2006  DT:  05/13/2006  Job #:  [id number]   cc:   Annie Main A. Sarajane Jews, MD

## 2011-03-08 NOTE — Consult Note (Signed)
NAME:  [name] [name], DUPUY NO.:  [id number]   MEDICAL RECORD NO.:  [id number]          PATIENT TYPE:  INP   LOCATION:  2023                         FACILITY:  [place]   PHYSICIAN:  Scarlett Presto, M.D.   DATE OF BIRTH:  1936-10-07   DATE OF CONSULTATION:  07/05/2005  DATE OF DISCHARGE:                                   CONSULTATION   HISTORY OF PRESENT ILLNESS:  Mr. Schwindt is a 75 year old man with no  known coronary artery disease who presents with several days of neck  tightness.  He had been actively involved in some heavy physical labor, he  had been shoveling and doing a few other things, and thinks that he might  have twisted his neck but he is not certain.  He said he had some pain in  his left arm which radiated up towards his neck yesterday and the neck  discomfort is waxing and waning tightness.  It is not exertional, he does  not have any shortness of breath, there is  no diaphoresis.  It has lasted  several hours.  Today, it is still ongoing and certainly gets worse when he  turns his neck to the side.  He denies any diaphoresis, no nausea or  vomiting, no PND or orthopnea.   ALLERGIES:  Penicillin.   CURRENT MEDICATIONS:  Lipitor 10 mg daily, Cozaar 100 mg daily, Lasix 40 mg  in the morning, 20 mg in the evening, Atenolol 50 mg b.i.d., he takes 10 mEq  potassium once daily, aspirin 81 mg daily, Relafen 750 mg twice daily,  Glucovance 5/500, 2 pills twice a day, he takes a multi-vitamin.   PAST MEDICAL HISTORY:  Significant for hypertension, long-standing.  He has  hyperlipidemia.  He has congestive heart failure with what is felt to be  both diastolic and systolic dysfunction.  He has an ejection fraction of  about 50% on an echo done last year.  He has had two cardiac  catheterizations in the past, the most recent of which was in 2003 at [facility], [place], where he had no significant coronary artery disease and  his LV systolic function was  normal as per the patient's history.  He does  have a history of severe, degenerative joint disease, long-standing history  of a heart murmur, and a history most recently of bilateral carotid bruits.   SOCIAL HISTORY:  He lives in [place] with his wife, he is not employed,  he is retired.  He is married and has two children.  He does not smoke,  drink, or use illicit drugs.   FAMILY HISTORY:  His mother died at age 37 of congestive heart failure.  His  father died at an advanced age of cancer.  He has one sister who has  hypertension.   REVIEW OF SYMPTOMS:  He denies any chills or sweats, but he does  occasionally get a fever.  No weight loss, no adenopathy, no headaches, no  sinus tenderness, no nasal discharge, no bleeding, no voice changes, no  vertigo, no photophobia, no vision or hearing loss, no problems with  his  dentition, no rashes, no skin lesions, no shortness of breath, no  diaphoresis, no PND, no orthopnea, no palpitations, no presyncope, no  syncope, no claudication, no cough, no wheezing.  No urinary frequency or  urgency, no problems with straining.  He occasionally does see some blood in  his urine.  No weakness, numbness, or mood disturbances.  He does have  recurrent myalgias and arthralgias in his back with some aching pain in that  area, as well.  No nausea, vomiting, diarrhea, bright red blood per rectum,  melena, hematochezia, odynophagia, dysphagia, GERD symptoms, or abdominal  pain.  No changes in his bowel habits.  No polyuria or polydipsia.  The  remainder of his review of systems is negative.   PHYSICAL EXAMINATION:  GENERAL:  Well developed, obese white male in no apparent distress.  He is  alert and oriented x 4.  VITAL SIGNS:  He weighs 294 pounds, blood pressure 144/70, pulse 57,  respiratory rate 20, saturation 100% on 2 liters nasal cannula.  HEENT:  Unremarkable.  NECK:  Supple, there is no jugular venous distention.  There are soft  bilateral  carotid bruits.  LUNGS:  Clear to auscultation bilaterally.  CARDIOVASCULAR:  Exam is regular.  He does have a 2/6 peaking systolic  murmur heard best in the precordial area.  This does not radiate.  No S3 or  S4.  His point of maximum impulse is not palpable.  SKIN:  Without rashes.  GU AND RECTAL:  Exam deferred.  ABDOMEN:  Soft, nontender, normal active bowel sounds, he is obese.  EXTREMITIES:  Lower extremities show 1+ edema in his calves, 1+ pulses,  decreased hair in that area consistent with mild venous stasis.  NEUROLOGICAL:  Grossly nonfocal.   LABORATORY DATA:  Chest x-ray is pending.  Electrocardiogram shows sinus  rhythm at a rate of 62 with left axis deviation, normal intervals, no  significant ST-T wave changes, no T waves.  His laboratories are all  pending.   IMPRESSION:  This is a gentleman with neck discomfort which is very atypical  and is ongoing.  His EKG is very reassuring without any ischemic ST-T wave  changes.  I really doubt this is angina, I think it is more likely to be DJD  of his neck.  He does have congestive heart failure which appears to be well  compensated now.  It is a mixture of both systolic and diastolic with an  ejection fraction of 50% and this has not been fully addressed regarding its  etiology.  I think it would be very reasonable to admit this gentleman as he  has been admitted and cycle his enzymes.  I would get a set of carotid  Dopplers since those have been planned and he has not arranged that for  follow up.  I think that if his enzymes are benign appearing and his  electrocardiogram looks OK, I think that he can probably have an outpatient  myocardial perfusion imaging study.  However, if his chest discomfort  continues to be ongoing, then it may benefit him to have further evaluation  done.  Obviously, his blood pressure could be better controlled and the  management of his diabetes I will leave to his primary doctors.       Scarlett Presto, M.D.  Electronically Signed     JH/MEDQ  D:  07/05/2005  T:  07/05/2005  Job:  [id number]

## 2011-03-08 NOTE — H&P (Signed)
NAME:  [name] [name], [name] [name]             ACCOUNT NO.:  [id number]   MEDICAL RECORD NO.:  [id number]          PATIENT TYPE:  INP   LOCATION:  2023                         FACILITY:  [facility]   PHYSICIAN:  Ishmael Holter. Sarajane Jews, M.D. [facility] OF BIRTH:  10-22-1935   DATE OF ADMISSION:  07/05/2005  DATE OF DISCHARGE:                                HISTORY & PHYSICAL   CHIEF COMPLAINT:  This is a 75 year old man with a history of nonobstructive  coronary artery disease, who had an episode of left arm pain two days ago,  now having two hours of continuous bilateral jaw and neck pain.   HISTORY OF PRESENT ILLNESS:  The patient has a known history of  nonobstructive coronary artery disease. He has had several catheterizations,  the last one having been done in [place] in September 2003. This showed some  diffuse nonobstructive coronary artery disease with preserved left  ventricular function. It also showed mild aortic stenosis and mild aortic  regurgitation. He and his wife moved to [place] from [place] about a year  ago. He did see Dr. Percival Spanish once in the clinic in September 2005 who felt  he was doing well at that time. The patient relates to  me that two nights  ago while sitting still he developed an aching-type pain throughout his left  arm. It then began to radiate up towards the left anterior neck region. This  persisted for four hours and then gradually went away. This was not  associated with nausea, sweats, chest pain, or shortness of breath. Then  earlier this afternoon, again while sitting still, he developed the same  aching type of pain which involved both sides of his anterior neck and then  radiated up to both jaw areas. It has continued now for two hours prior to  his coming into his clinic. Again, he denies any chest pain, nausea, sweats,  or shortness of breath. He did take his usual aspirin this morning along  with his usual medications.   PAST MEDICAL HISTORY:  Otherwise includes  hypertension and hyperlipidemia.  We also recently diagnosed him with type 2 diabetes mellitus in December  2005.  He has done reasonably well with diet and oral medications since that  time. He has degenerative arthritis. He has had bone spurs removed from his  right heel. He has had benign colon polyps removed, with his last  colonoscopy having been done in 2003. He is status post appendectomy.   ALLERGIES:  PENICILLIN.   CURRENT MEDICATIONS:  1.  Lipitor 10 mg per day.  2.  Folic acid 1 mg per day.  3.  Cozaar 100 mg per day.  4.  Furosemide 40 mg one tablet in the morning and one-half tablet in the      evening.  5.  Atenolol 50 mg b.i.d.  6.  Potassium chloride 10 mEq daily.  7.  Ultracet 37.5/325 mg to take as needed for pain.  8.  Aspirin 81 mg per day.  9.  Relafen 750 mg two capsules daily.  10. Glucovance 5/500 two tablets b.i.d.   HABITS:  The patient  does not use tobacco or alcohol.   SOCIAL HISTORY:  He is retired. He is here with his wife.   FAMILY HISTORY:  Remarkable for heart disease, hypertension, and colon  cancer in his parents.   OBJECTIVE:  GENERAL: He is alert and in no acute distress. He is quite  obese.  VITAL SIGNS: Temperature 98.9 degrees, pulse 70 and regular, respirations 12  and comfortable, blood pressure 160/100.  SKIN: Warm and dry.  HEENT: Eyes are clear, wears glasses. Ears are clear. Oropharynx is clear.  NECK: Supple without lymphadenopathy or masses. There is no tenderness  around the jaw or mouth area.  LUNGS: Clear.  CARDIAC: Regular rate and rhythm without gallops or rubs. He has his usual  2/6 systolic murmur heard loudest at the base. Distal pulses are full.  ABDOMEN: Soft, normal bowel sounds, nontender, and no masses.  EXTREMITIES: No clubbing, cyanosis, but with trace edema around both ankles.  NEUROLOGIC: Grossly intact.   EKG today shows no change from his baseline. He has mild prolongation of his  QTC interval, but no Q  waves and no ST interval or T wave changes apparent.   ASSESSMENT/PLAN:  Several episodes in the last few days of arm pain, neck  pain, and jaw pain which may well be an anginal equivalent type of picture  in a diabetic male with a number of risk factors for coronary artery  disease. He is given some sublingual nitroglycerin here in the clinic which  did not reduce his jaw pain. He was placed on nasal cannula oxygen. EMS was  called who came and will transport him directly to [facility] to a monitored  unit. Labs will be drawn including serial cardiac enzymes, chest x-ray will  be obtained, serial EKGs will be obtained. I did consult cardiology and  spoke with Trish of the [facility] Cardiology department who will evaluate him.  Standard angina protocol will be followed.           ______________________________  Ishmael Holter Sarajane Jews, M.D. [facility]     SAF/MEDQ  D:  07/05/2005  T:  07/05/2005  Job:  [id number]

## 2011-03-08 NOTE — Assessment & Plan Note (Signed)
HEALTHCARE                               PULMONARY OFFICE NOTE   NAME:[name] [name], [name] [name]                    MRN:          [id number]  DATE:07/23/2006                            DOB:          02-Nov-1935    HISTORY:  This is a 75 year old white male, never smoker, with the abrupt  onset in early August of hacking cough productive of slightly brown/reddish  sputum, fever of 100 and dyspnea, but no definite rigors, __________, or  sore throat.  He was treated with several different antibiotics, and  eventually had a chest x-ray and CT scan performed (see studies below) with  a diagnosis of possible pneumonia, and treated with prolonged course of  Levaquin and now feeling better.  In fact, since September 28 he has had no  symptoms at all of either any dyspnea or cough or recurrent fever, chills,  sweats, unintended weight loss.   PAST MEDICAL HISTORY:  Significant for:  1. Obesity.  2. Hypertension.  3. Diabetes.  4. Hyperlipidemia.   ALLERGIES:  PENICILLIN causes a rash.   MEDICATIONS:  Taken in detail on the worksheet, do not include any pulmonary  medications.  See the face sheet column dated July 23, 2006 for details.   SOCIAL HISTORY:  He has never smoked.   FAMILY HISTORY:  Positive for intestinal cancer in his father, negative for  lung cancer or lymphoma, negative for atopy or asthma.   REVIEW OF SYSTEMS:  Taken in detail on the work sheet, significant for the  absence of any active complaints.   PHYSICAL EXAMINATION:  This is a slightly anxious, obese white male in no  acute distress.  Afebrile, stable vital signs.  HEENT:  Unremarkable, nasal turbinates are normal, oropharynx is clear,  dentition is intact, ear canals clear bilaterally.  NECK:  Supple without cervical adenopathy or tenderness.  The trachea was  midline, no thyromegaly.  LUNGS:  Lung fields are perfectly clear bilaterally to auscultation and  percussion.  There  is a regular rhythm without murmur, gallop or rub.  ABDOMEN:  Soft, benign.  EXTREMITIES:  Warm without calf tenderness, cyanosis, clubbing or edema.   Chest x-rays from [facility] dated June 05, 2006, were normal.  However,  a CT scan of the chest dated August 28 suggested a hilar and mediastinal  adenopathy as well as splenomegaly.  The largest nodes were less than 2 x 2  cm.  Therefore, not necessarily pathologic.   Chest x-ray was performed today, and shows no macroscopic evidence of  infiltrates or adenopathy.   IMPRESSION:  Unusual prolonged syndrome of cough associated with initially  purulent sputum production, low-grade fever consistent with  bronchopneumonia, which eventually responded to antibiotics.  The adenopathy  in this setting is nonspecific.  I recommended a sedimentation rate, but  understand he had a normal CBC and CMET at Dr. Barbie Banner office, and did not  repeat these.   If his sedimentation rate is normal as well as CBC and CMET, I would not  pursue the workup any further unless, of course, splenomegaly is of concern  to  Dr. Sarajane Jews, in which case a hematology evaluation could be considered.   I did make the strong recommendation to the patient on his next upper  respiratory tract infection that he consider avoiding all mint and menthol  lozenges (which can perpetuate reflux, which can perpetuate cough) and use  Delsym or Mucinex DM for cough as well as Prilosec empirically before meals,  because all of the symptoms he expressed today seem markedly  disproportionate to objective findings, and given his obesity I am sure he  is refluxing during severe coughing paroxysms, which may in the future  aggravate what otherwise should be limited upper respiratory tract  infections.            ______________________________  Christena Deem Melvyn Novas, MD, [name]      MBW/MedQ  DD:  07/23/2006  DT:  07/25/2006  Job #:  [id number]   cc:   Ishmael Holter. Sarajane Jews, MD

## 2011-06-06 ENCOUNTER — Ambulatory Visit (INDEPENDENT_AMBULATORY_CARE_PROVIDER_SITE_OTHER): Payer: Medicare Other | Admitting: *Deleted

## 2011-06-06 DIAGNOSIS — I4891 Unspecified atrial fibrillation: Secondary | ICD-10-CM

## 2011-06-06 DIAGNOSIS — I359 Nonrheumatic aortic valve disorder, unspecified: Secondary | ICD-10-CM

## 2011-06-06 DIAGNOSIS — Z952 Presence of prosthetic heart valve: Secondary | ICD-10-CM

## 2011-06-06 DIAGNOSIS — Z7901 Long term (current) use of anticoagulants: Secondary | ICD-10-CM

## 2011-06-12 ENCOUNTER — Encounter: Payer: Self-pay | Admitting: Cardiology

## 2011-06-14 ENCOUNTER — Encounter: Payer: Self-pay | Admitting: Cardiology

## 2011-06-14 ENCOUNTER — Ambulatory Visit (INDEPENDENT_AMBULATORY_CARE_PROVIDER_SITE_OTHER): Payer: Self-pay | Admitting: Internal Medicine

## 2011-06-14 DIAGNOSIS — R0989 Other specified symptoms and signs involving the circulatory and respiratory systems: Secondary | ICD-10-CM

## 2011-06-18 ENCOUNTER — Encounter: Payer: Medicare Other | Admitting: *Deleted

## 2011-06-19 ENCOUNTER — Telehealth: Payer: Self-pay | Admitting: Cardiology

## 2011-06-19 NOTE — Telephone Encounter (Signed)
Pt has an appt for dental filling 9-4- does he need to be medicated ? If so call into [facility], ok to leave message, pt will be in dialysis

## 2011-06-19 NOTE — Telephone Encounter (Signed)
No

## 2011-06-20 ENCOUNTER — Ambulatory Visit (INDEPENDENT_AMBULATORY_CARE_PROVIDER_SITE_OTHER): Payer: Medicare Other | Admitting: *Deleted

## 2011-06-20 DIAGNOSIS — Z7901 Long term (current) use of anticoagulants: Secondary | ICD-10-CM

## 2011-06-20 DIAGNOSIS — I4891 Unspecified atrial fibrillation: Secondary | ICD-10-CM

## 2011-06-20 DIAGNOSIS — Z952 Presence of prosthetic heart valve: Secondary | ICD-10-CM

## 2011-06-20 DIAGNOSIS — I359 Nonrheumatic aortic valve disorder, unspecified: Secondary | ICD-10-CM

## 2011-06-20 LAB — POCT INR: INR: 3.2

## 2011-06-20 NOTE — Telephone Encounter (Signed)
Pt aware that Dr Percival Spanish does not recommend any pre-medication.

## 2011-07-04 ENCOUNTER — Ambulatory Visit (INDEPENDENT_AMBULATORY_CARE_PROVIDER_SITE_OTHER): Payer: Medicare Other | Admitting: *Deleted

## 2011-07-04 DIAGNOSIS — I359 Nonrheumatic aortic valve disorder, unspecified: Secondary | ICD-10-CM

## 2011-07-04 DIAGNOSIS — I4891 Unspecified atrial fibrillation: Secondary | ICD-10-CM

## 2011-07-04 DIAGNOSIS — Z952 Presence of prosthetic heart valve: Secondary | ICD-10-CM

## 2011-07-04 DIAGNOSIS — Z7901 Long term (current) use of anticoagulants: Secondary | ICD-10-CM

## 2011-07-04 LAB — POCT INR: INR: 2.5

## 2011-07-19 ENCOUNTER — Ambulatory Visit (INDEPENDENT_AMBULATORY_CARE_PROVIDER_SITE_OTHER): Payer: Self-pay | Admitting: Internal Medicine

## 2011-07-25 ENCOUNTER — Encounter: Payer: Medicare Other | Admitting: *Deleted

## 2011-07-30 ENCOUNTER — Encounter: Payer: Medicare Other | Admitting: Oncology

## 2011-07-30 ENCOUNTER — Encounter (HOSPITAL_BASED_OUTPATIENT_CLINIC_OR_DEPARTMENT_OTHER): Payer: Medicare Other | Admitting: Oncology

## 2011-07-30 ENCOUNTER — Other Ambulatory Visit: Payer: Self-pay | Admitting: Oncology

## 2011-07-30 DIAGNOSIS — R161 Splenomegaly, not elsewhere classified: Secondary | ICD-10-CM

## 2011-07-30 DIAGNOSIS — D696 Thrombocytopenia, unspecified: Secondary | ICD-10-CM

## 2011-07-30 DIAGNOSIS — I4891 Unspecified atrial fibrillation: Secondary | ICD-10-CM

## 2011-07-30 DIAGNOSIS — D631 Anemia in chronic kidney disease: Secondary | ICD-10-CM

## 2011-07-30 DIAGNOSIS — N186 End stage renal disease: Secondary | ICD-10-CM

## 2011-07-30 LAB — CBC WITH DIFFERENTIAL/PLATELET
BASO%: 0.4 % (ref 0.0–2.0)
LYMPH%: 18.6 % (ref 14.0–49.0)
MCHC: 34.6 g/dL (ref 32.0–36.0)
MONO#: 0.3 10*3/uL (ref 0.1–0.9)
MONO%: 5.3 % (ref 0.0–14.0)
Platelets: 46 10*3/uL — ABNORMAL LOW (ref 140–400)
RBC: 3.54 10*6/uL — ABNORMAL LOW (ref 4.20–5.82)
WBC: 5.1 10*3/uL (ref 4.0–10.3)
nRBC: 0 % (ref 0–0)

## 2011-07-30 LAB — COMPREHENSIVE METABOLIC PANEL
Albumin: 4.2 g/dL (ref 3.5–5.2)
BUN: 33 mg/dL — ABNORMAL HIGH (ref 6–23)
CO2: 35 mEq/L — ABNORMAL HIGH (ref 19–32)
Calcium: 9 mg/dL (ref 8.4–10.5)
Chloride: 94 mEq/L — ABNORMAL LOW (ref 96–112)
Glucose, Bld: 85 mg/dL (ref 70–99)
Potassium: 3.8 mEq/L (ref 3.5–5.3)

## 2011-07-30 LAB — MORPHOLOGY

## 2011-08-01 ENCOUNTER — Encounter: Payer: Self-pay | Admitting: Oncology

## 2011-08-01 DIAGNOSIS — D631 Anemia in chronic kidney disease: Secondary | ICD-10-CM | POA: Insufficient documentation

## 2011-08-01 DIAGNOSIS — D696 Thrombocytopenia, unspecified: Secondary | ICD-10-CM | POA: Insufficient documentation

## 2011-08-01 DIAGNOSIS — R161 Splenomegaly, not elsewhere classified: Secondary | ICD-10-CM | POA: Insufficient documentation

## 2011-08-01 LAB — HEPARIN INDUCED THROMBOCYTOPENIA PNL
UFH Low Dose,0.1: 0 % Release
UFH Low Dose,0.5: 0 % Release
UFH SRA Result: NEGATIVE

## 2011-08-02 ENCOUNTER — Other Ambulatory Visit: Payer: Self-pay | Admitting: Oncology

## 2011-08-02 ENCOUNTER — Other Ambulatory Visit: Payer: Self-pay | Admitting: *Deleted

## 2011-08-02 DIAGNOSIS — I509 Heart failure, unspecified: Secondary | ICD-10-CM

## 2011-08-02 DIAGNOSIS — R160 Hepatomegaly, not elsewhere classified: Secondary | ICD-10-CM

## 2011-08-02 DIAGNOSIS — D696 Thrombocytopenia, unspecified: Secondary | ICD-10-CM

## 2011-08-06 LAB — PROTEIN ELECTROPHORESIS, SERUM
Albumin ELP: 63.5 % (ref 55.8–66.1)
Alpha-1-Globulin: 5.7 % — ABNORMAL HIGH (ref 2.9–4.9)
Beta 2: 3.8 % (ref 3.2–6.5)
Total Protein, Serum Electrophoresis: 7.1 g/dL (ref 6.0–8.3)

## 2011-08-06 LAB — TSH: TSH: 1.257 u[IU]/mL (ref 0.350–4.500)

## 2011-08-07 ENCOUNTER — Other Ambulatory Visit (HOSPITAL_COMMUNITY): Payer: Medicare Other

## 2011-08-12 ENCOUNTER — Other Ambulatory Visit: Payer: Self-pay | Admitting: Oncology

## 2011-08-12 DIAGNOSIS — D696 Thrombocytopenia, unspecified: Secondary | ICD-10-CM

## 2011-08-13 ENCOUNTER — Ambulatory Visit (HOSPITAL_COMMUNITY)
Admission: RE | Admit: 2011-08-13 | Discharge: 2011-08-13 | Disposition: A | Discharge status: Home / Self Care | Payer: Medicare Other | Source: Ambulatory Visit | Attending: Oncology | Admitting: Oncology

## 2011-08-13 DIAGNOSIS — E119 Type 2 diabetes mellitus without complications: Secondary | ICD-10-CM | POA: Insufficient documentation

## 2011-08-13 DIAGNOSIS — N289 Disorder of kidney and ureter, unspecified: Secondary | ICD-10-CM | POA: Insufficient documentation

## 2011-08-13 DIAGNOSIS — D696 Thrombocytopenia, unspecified: Secondary | ICD-10-CM

## 2011-08-13 DIAGNOSIS — K8020 Calculus of gallbladder without cholecystitis without obstruction: Secondary | ICD-10-CM | POA: Insufficient documentation

## 2011-08-13 DIAGNOSIS — R161 Splenomegaly, not elsewhere classified: Secondary | ICD-10-CM | POA: Insufficient documentation

## 2011-08-13 DIAGNOSIS — I120 Hypertensive chronic kidney disease with stage 5 chronic kidney disease or end stage renal disease: Secondary | ICD-10-CM | POA: Insufficient documentation

## 2011-08-13 DIAGNOSIS — K766 Portal hypertension: Secondary | ICD-10-CM | POA: Insufficient documentation

## 2011-08-13 DIAGNOSIS — N186 End stage renal disease: Secondary | ICD-10-CM | POA: Insufficient documentation

## 2011-08-14 ENCOUNTER — Other Ambulatory Visit (HOSPITAL_COMMUNITY): Payer: Medicare Other

## 2011-08-14 ENCOUNTER — Encounter: Payer: Self-pay | Admitting: Cardiology

## 2011-08-15 ENCOUNTER — Ambulatory Visit (HOSPITAL_COMMUNITY): Payer: Medicare Other | Attending: Cardiology | Admitting: Radiology

## 2011-08-15 ENCOUNTER — Ambulatory Visit (INDEPENDENT_AMBULATORY_CARE_PROVIDER_SITE_OTHER): Payer: Medicare Other | Admitting: *Deleted

## 2011-08-15 ENCOUNTER — Encounter: Payer: Medicare Other | Admitting: *Deleted

## 2011-08-15 DIAGNOSIS — Z954 Presence of other heart-valve replacement: Secondary | ICD-10-CM | POA: Insufficient documentation

## 2011-08-15 DIAGNOSIS — E119 Type 2 diabetes mellitus without complications: Secondary | ICD-10-CM | POA: Insufficient documentation

## 2011-08-15 DIAGNOSIS — I359 Nonrheumatic aortic valve disorder, unspecified: Secondary | ICD-10-CM

## 2011-08-15 DIAGNOSIS — I509 Heart failure, unspecified: Secondary | ICD-10-CM | POA: Insufficient documentation

## 2011-08-15 DIAGNOSIS — R160 Hepatomegaly, not elsewhere classified: Secondary | ICD-10-CM

## 2011-08-15 DIAGNOSIS — Z7901 Long term (current) use of anticoagulants: Secondary | ICD-10-CM

## 2011-08-15 DIAGNOSIS — I10 Essential (primary) hypertension: Secondary | ICD-10-CM | POA: Insufficient documentation

## 2011-08-15 DIAGNOSIS — I4891 Unspecified atrial fibrillation: Secondary | ICD-10-CM | POA: Insufficient documentation

## 2011-08-15 DIAGNOSIS — E785 Hyperlipidemia, unspecified: Secondary | ICD-10-CM | POA: Insufficient documentation

## 2011-08-15 DIAGNOSIS — E669 Obesity, unspecified: Secondary | ICD-10-CM | POA: Insufficient documentation

## 2011-08-15 DIAGNOSIS — Z952 Presence of prosthetic heart valve: Secondary | ICD-10-CM

## 2011-08-15 DIAGNOSIS — I2510 Atherosclerotic heart disease of native coronary artery without angina pectoris: Secondary | ICD-10-CM | POA: Insufficient documentation

## 2011-08-15 LAB — PROTIME-INR

## 2011-08-21 ENCOUNTER — Telehealth: Payer: Self-pay | Admitting: Cardiology

## 2011-08-21 NOTE — Telephone Encounter (Signed)
Spoke w/pt. He said he saw Dr. Lamonte Sakai today and he told him that "he could bleed to death" if stayed on coumadin. States he talked w/Pam last week and she spoke w/Dr. Percival Spanish who said for him to stay on coumadin. [name] Brown is so confused. He said Dr. Lamonte Sakai said "it was cardiologist call and he wasn't going to take responsibility for it". Advised would send to [facility] for her to check w/Dr. Percival Spanish again tomorrow. He will be on his cell phone.

## 2011-08-21 NOTE — Telephone Encounter (Signed)
New message  He is going to dr ha-hematologist and he told him he could bleed to death  He is confused about taking coumadin  Does he need to stop it or not?  He said his platlets are low they are 62 on oct 24  Please call him back an let him know

## 2011-08-22 NOTE — Telephone Encounter (Signed)
Will forward to Dr Percival Spanish for recommendation.

## 2011-08-22 NOTE — Telephone Encounter (Signed)
Pt has been scheduled for an appointment to discuss with Dr Percival Spanish.

## 2011-08-23 ENCOUNTER — Encounter: Payer: Self-pay | Admitting: Cardiology

## 2011-08-23 ENCOUNTER — Ambulatory Visit (INDEPENDENT_AMBULATORY_CARE_PROVIDER_SITE_OTHER): Payer: Medicare Other | Admitting: Cardiology

## 2011-08-23 VITALS — BP 142/58 | HR 68 | Resp 18 | Ht 71.0 in | Wt 202.8 lb

## 2011-08-23 DIAGNOSIS — I4891 Unspecified atrial fibrillation: Secondary | ICD-10-CM

## 2011-08-23 DIAGNOSIS — I2510 Atherosclerotic heart disease of native coronary artery without angina pectoris: Secondary | ICD-10-CM

## 2011-08-23 DIAGNOSIS — I080 Rheumatic disorders of both mitral and aortic valves: Secondary | ICD-10-CM

## 2011-08-23 DIAGNOSIS — I10 Essential (primary) hypertension: Secondary | ICD-10-CM

## 2011-08-23 MED ORDER — ASPIRIN EC 81 MG PO TBEC: 81.0000 mg | DELAYED_RELEASE_TABLET | Freq: Every day | ORAL | Status: AC

## 2011-08-23 NOTE — Patient Instructions (Signed)
Please stop Warfarin Start Asprin 81 mg a day Continue all other medications as listed  Follow up with Dr Percival Spanish in March 2013

## 2011-08-23 NOTE — Assessment & Plan Note (Signed)
The blood pressure is at target. No change in medications is indicated. We will continue with therapeutic lifestyle changes (TLC).  

## 2011-08-23 NOTE — Progress Notes (Signed)
HPI The patient presents for followup of thrombocytopenia. He's had an extensive workup recently in [place] and is here. The etiology of this is not entirely clear. From a cardiac standpoint he had a recent echocardiogram to see if he might have passive hepatic congestion contributing to this. He apparently does have hepatomegaly. He does have some RV dysfunction with elevated pulmonary pressures. This might be slightly more than previous. However, he does not have symptoms or echo findings that would suggest this as a likely etiology. We did stop amiodarone to see if this was a possible culprit. I have been in discussions with his hematologist to try to better understand the risk benefits of continuing Coumadin.  Dr. Lamonte Sakai feels strongly that the bleeding risk is very high given the combination of thrombocytopenia, dialysis and warfarin.  I brought the patient back today to discuss this. He is quite upset that he has "been caught in the middle" concerning whether he should or should not be on Coumadin. He's had no recent palpitations and since his cardioversion no evidence of atrial fibrillation. He denies any chest pressure, neck or arm discomfort. He denies any shortness of breath, PND or orthopnea. He tolerates dialysis. He's had no weight gain or edema. He denies any bleeding or bruising. He has had no cough fevers or chills.  Allergies  Allergen Reactions  . Penicillins     REACTION: hives    Current Outpatient Prescriptions  Medication Sig Dispense Refill  . allopurinol (ZYLOPRIM) 100 MG tablet Take 1 tablet (100 mg total) by mouth daily.  30 tablet  11  . ALPRAZolam (XANAX) 0.5 MG tablet Take 1 tablet (0.5 mg total) by mouth every 6 (six) hours as needed for Sleep or Anxiety.  60 tablet  5  . B Complex-C-Folic Acid (VOL-CARE RX) 1 MG TABS       . B-D ULTRAFINE III SHORT PEN 31G X 8 MM MISC AS DIRECTED  100 each  1  . carvedilol (COREG) 3.125 MG tablet Take 3.125 mg by mouth 2 (two) times daily  with a meal.        . colchicine 0.6 MG tablet Take 0.6 mg by mouth as needed.        . fish oil-omega-3 fatty acids 1000 MG capsule Take 1 g by mouth daily.       . furosemide (LASIX) 80 MG tablet Take 1 tablet by mouth daily. TAKE 1-2 TABLETS DAILY.      Marland Kitchen glucose blood test strip 1 each by Other route as needed. Use as instructed       . LANTUS SOLOSTAR 100 UNIT/ML injection USE 30 UNITS AT BEDTIME  3 mL  1  . levothyroxine (SYNTHROID, LEVOTHROID) 25 MCG tablet Take 25 mcg by mouth daily.       . Nutritional Supplements (NEPRO) LIQD Take by mouth daily.        . simvastatin (ZOCOR) 20 MG tablet Take 20 mg by mouth at bedtime.       . temazepam (RESTORIL) 30 MG capsule Take 30 mg by mouth at bedtime as needed.        . warfarin (COUMADIN) 2 MG tablet Take by mouth as directed.        . diphenhydrAMINE (BENADRYL) 25 MG tablet Take 25 mg by mouth at bedtime as needed.        . fluticasone (FLONASE) 50 MCG/ACT nasal spray Place 2 sprays into the nose as needed.       Marland Kitchen oxyCODONE (  OXY IR/ROXICODONE) 5 MG immediate release tablet Take 5 mg by mouth every 6 (six) hours as needed.       . polyethylene glycol (MIRALAX / GLYCOLAX) packet Take 17 g by mouth daily.        . traMADol (ULTRAM) 50 MG tablet Take 50 mg by mouth every 6 (six) hours as needed.         Past Medical History  Diagnosis Date  . CAD (coronary artery disease)   . Hypertension   . Hyperlipidemia   . Myocardial infarction   . Diabetes mellitus   . Gout   . Obesity   . Chronic kidney disease     chronic renal failure  . Atrial fibrillation   . CVA (cerebral infarction) 05/2010  . Thrombocytopenia 2011  . Splenomegaly 2012  . Anemia associated with chronic renal failure     Past Surgical History  Procedure Date  . Appendectomy   . Coronary artery bypass graft   . Bone spurs removal     from right heel    ROS:  As stated in the HPI and negative for all other systems.  PHYSICAL EXAM BP 142/58  Pulse 68  Resp 18   Ht 5\' 11"  (1.803 m)  Wt 202 lb 12.8 oz (91.989 kg)  BMI 28.28 kg/m2 GENERAL:  Well appearing HEENT:  Pupils equal round and reactive, fundi not visualized, oral mucosa unremarkable NECK:  No jugular venous distention, waveform within normal limits, carotid upstroke brisk and symmetric, no bruits, no thyromegaly LYMPHATICS:  No cervical, inguinal adenopathy LUNGS:  Clear to auscultation bilaterally BACK:  No CVA tenderness CHEST:  Well healed sternotomy scar. HEART:  PMI not displaced or sustained,S1 and S2 within normal limits, no S3, no S4, no clicks, no rubs, no murmurs ABD:  Flat, positive bowel sounds normal in frequency in pitch, no bruits, no rebound, no guarding, no midline pulsatile mass, no hepatomegaly, no splenomegaly EXT:  2 plus pulses throughout, upper dialysis fistula, no edema, no cyanosis no clubbing SKIN:  No rashes no nodules NEURO:  Cranial nerves II through XII grossly intact, motor grossly intact throughout PSYCH:  Cognitively intact, oriented to person place and time   ASSESSMENT AND PLAN

## 2011-08-23 NOTE — Assessment & Plan Note (Signed)
I will follow up his AVR with physical exam and echoes as needed in the future.

## 2011-08-23 NOTE — Assessment & Plan Note (Signed)
Mr. [name] Brown and I had a long discussion about risk benefit of warfarin.  I explained to him that with his previous atrial fibrillation, previous CVA and other comorbidities he is at significant risk for embolic stroke in the future if he is having paroxysmal atrial fibrillation (which I cannot absolutely exclude) and certainly if he has persistent atrial fibrillation in the future. He understands that he is at risk for persistent atrial fibrillation as he has had it in the past and as he is now off of his amiodarone. He understands that fibrillation if it recurs could go undiagnosed leaving him exposed to risk. However, after 2 discussions with Dr. Lamonte Sakai it is my understanding, and the patients understanding, that Dr. Lamonte Sakai considers the risk of bleeding with coumadin to be high.  [name] Brown understands that it appears that the risk benefit falls on the side of discontinuing the warfarin at this time.  Certainly if his platelet count improves we could reconsider.  If his atrial fibrillation is documented as persistent in the future we could reconsider.  I also would ask Dr. Lamonte Sakai to contact me to resume the amiodarone in the future if an alternative etiology for his thrombocytopenia is identified.  Finally I will restart ASA 81 mg daily but I will ask Dr. Lamonte Sakai if he thinks that there are any contraindications to this.

## 2011-08-23 NOTE — Assessment & Plan Note (Signed)
The patient has no new sypmtoms.  No further cardiovascular testing is indicated.  We will continue with aggressive risk reduction and meds as listed.  

## 2011-08-27 ENCOUNTER — Ambulatory Visit (INDEPENDENT_AMBULATORY_CARE_PROVIDER_SITE_OTHER): Payer: Medicare Other | Admitting: Family Medicine

## 2011-08-27 ENCOUNTER — Encounter: Payer: Self-pay | Admitting: Family Medicine

## 2011-08-27 VITALS — BP 124/60 | HR 71 | Temp 98.8°F | Wt 199.0 lb

## 2011-08-27 DIAGNOSIS — D696 Thrombocytopenia, unspecified: Secondary | ICD-10-CM

## 2011-08-27 DIAGNOSIS — H6120 Impacted cerumen, unspecified ear: Secondary | ICD-10-CM

## 2011-08-27 DIAGNOSIS — N529 Male erectile dysfunction, unspecified: Secondary | ICD-10-CM

## 2011-08-27 DIAGNOSIS — I4891 Unspecified atrial fibrillation: Secondary | ICD-10-CM

## 2011-08-27 DIAGNOSIS — H6123 Impacted cerumen, bilateral: Secondary | ICD-10-CM

## 2011-08-27 MED ORDER — TADALAFIL 20 MG PO TABS: 20.0000 mg | ORAL_TABLET | Freq: Every day | ORAL | Status: AC | PRN

## 2011-08-27 NOTE — Progress Notes (Signed)
  Subjective:    Patient ID: [name] Brown, male    DOB: [birth year], 75 y.o.   MRN: [id number]  HPI Here for multiple issues. First he has trouble hearing and wants his ears flushed out. No pain. Also he asks my opinion about risks vs benefits of taking Coumadin. As noted in detail in Dr. Rosezella Florida last note, [name] Brown had been on Coumadin for his hx of paroxsymal atrial fibrillation. However he has had severe thrombocytopenia and is seeing Dr. Lamonte Sakai for this. His last platelet count was 49 on 08-14-11. Dr. Lamonte Sakai felt that there is a significant chance that the Coumadin was contributing to his thrombocytopenia, and he recommended stopping the Coumadin. After consulting with Dr. Percival Spanish, the two of them agreed to stop the Coumadin and the Amiodarone he was taking. Lastly, [name] Brown has had erectile difficulties for some years and would like to try something for this. He is not taking any nitrates at this time.    Review of Systems  Constitutional: Negative.   HENT: Positive for hearing loss. Negative for ear pain, congestion, sinus pressure and ear discharge.   Respiratory: Negative.   Cardiovascular: Negative.        Objective:   Physical Exam  Constitutional: He appears well-developed and well-nourished.  HENT:       Both ears are full of cerumen  Cardiovascular: Normal rate, regular rhythm, normal heart sounds and intact distal pulses.   Pulmonary/Chest: Effort normal and breath sounds normal.  Musculoskeletal: He exhibits no edema.          Assessment & Plan:  His ears were irrigated clear with water. From my standpoint I agreed with stopping Coumadin due to the high risk of bleeding given his platelet situation. He is safe to try Cialis, so we sent in a rx for this.

## 2011-09-06 ENCOUNTER — Encounter: Payer: Self-pay | Admitting: *Deleted

## 2011-09-06 NOTE — Progress Notes (Signed)
CBC from [place] rec'd and forwarded results to Dr. Lamonte Sakai.

## 2011-09-17 ENCOUNTER — Encounter: Payer: Medicare Other | Admitting: *Deleted

## 2011-09-20 ENCOUNTER — Encounter: Payer: Self-pay | Admitting: *Deleted

## 2011-09-20 NOTE — Progress Notes (Signed)
CBC rec'd from [place] [facility] and forwarded to Dr. Lamonte Sakai.

## 2011-09-23 ENCOUNTER — Encounter: Payer: Self-pay | Admitting: *Deleted

## 2011-10-03 ENCOUNTER — Other Ambulatory Visit: Payer: Self-pay | Admitting: Family Medicine

## 2011-10-03 ENCOUNTER — Telehealth: Payer: Self-pay | Admitting: Oncology

## 2011-10-03 ENCOUNTER — Ambulatory Visit (HOSPITAL_BASED_OUTPATIENT_CLINIC_OR_DEPARTMENT_OTHER): Payer: Medicare Other | Admitting: Oncology

## 2011-10-03 ENCOUNTER — Other Ambulatory Visit (HOSPITAL_BASED_OUTPATIENT_CLINIC_OR_DEPARTMENT_OTHER): Payer: Medicare Other | Admitting: Lab

## 2011-10-03 VITALS — BP 123/59 | HR 70 | Temp 98.4°F | Ht 71.0 in | Wt 200.8 lb

## 2011-10-03 DIAGNOSIS — N186 End stage renal disease: Secondary | ICD-10-CM

## 2011-10-03 DIAGNOSIS — D696 Thrombocytopenia, unspecified: Secondary | ICD-10-CM

## 2011-10-03 DIAGNOSIS — N289 Disorder of kidney and ureter, unspecified: Secondary | ICD-10-CM

## 2011-10-03 DIAGNOSIS — D649 Anemia, unspecified: Secondary | ICD-10-CM

## 2011-10-03 LAB — CBC WITH DIFFERENTIAL/PLATELET
BASO%: 0.2 % (ref 0.0–2.0)
EOS%: 6.3 % (ref 0.0–7.0)
LYMPH%: 15.2 % (ref 14.0–49.0)
MCHC: 33.7 g/dL (ref 32.0–36.0)
MONO#: 0.3 10*3/uL (ref 0.1–0.9)
RBC: 2.88 10*6/uL — ABNORMAL LOW (ref 4.20–5.82)
WBC: 4.5 10*3/uL (ref 4.0–10.3)
lymph#: 0.7 10*3/uL — ABNORMAL LOW (ref 0.9–3.3)
nRBC: 0 % (ref 0–0)

## 2011-10-03 LAB — COMPREHENSIVE METABOLIC PANEL
ALT: 11 U/L (ref 0–53)
AST: 21 U/L (ref 0–37)
Alkaline Phosphatase: 64 U/L (ref 39–117)
Calcium: 8.7 mg/dL (ref 8.4–10.5)
Chloride: 99 mEq/L (ref 96–112)
Creatinine, Ser: 3.88 mg/dL — ABNORMAL HIGH (ref 0.50–1.35)

## 2011-10-03 NOTE — Progress Notes (Signed)
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  INTERVAL HISTORY: [name] Brown 75 y.o. male returns for regular follow up with his girlfriend.  He reports minor bruises on his arms.  He denies visible source of bleeding.  He tolerates HD well without problem.    Patient denies fatigue, headache, visual changes, confusion, drenching night sweats, palpable lymph node swelling, mucositis, odynophagia, dysphagia, nausea vomiting, jaundice, chest pain, palpitation, shortness of breath, dyspnea on exertion, productive cough, gum bleeding, epistaxis, hematemesis, hemoptysis, abdominal pain, abdominal swelling, early satiety, melena, hematochezia, hematuria, skin rash, spontaneous bleeding, joint swelling, joint pain, heat or cold intolerance, bowel bladder incontinence, back pain, focal motor weakness, paresthesia, depression, suicidal or homocidal ideation, feeling hopelessness.  MEDICAL HISTORY: Past Medical History  Diagnosis Date  . CAD (coronary artery disease)   . Hypertension   . Hyperlipidemia   . Myocardial infarction   . Diabetes mellitus   . Gout   . Obesity   . Chronic kidney disease     chronic renal failure  . Atrial fibrillation   . CVA (cerebral infarction) 05/2010  . Thrombocytopenia 2011    sees Dr. Lamonte Sakai  . Splenomegaly 2012  . Anemia associated with chronic renal failure   . Aortic stenosis     SURGICAL HISTORY:  Past Surgical History  Procedure Date  . Appendectomy   . Coronary artery bypass graft     June 2 011 Limited the LAD, SVG to OM, SVG to PDA  . Bone spurs     from right heel  . Aortic valve replacement     Pericardial tissue valve    MEDICATIONS: Current Outpatient Prescriptions  Medication Sig Dispense Refill  .  allopurinol (ZYLOPRIM) 100 MG tablet Take 1 tablet (100 mg total) by mouth daily.  30 tablet  11  . ALPRAZolam (XANAX) 0.5 MG tablet Take 1 tablet (0.5 mg total) by mouth every 6 (six) hours as needed for Sleep or Anxiety.  60 tablet  5  . aspirin EC 81 MG tablet Take 1 tablet (81 mg total) by mouth daily.  150 tablet  2  . B Complex-C-Folic Acid (VOL-CARE RX) 1 MG TABS       . B-D ULTRAFINE III SHORT PEN 31G X 8 MM MISC AS DIRECTED  100 each  1  . calcium carbonate (TUMS - DOSED IN MG ELEMENTAL CALCIUM) 500 MG chewable tablet Chew 1 tablet by mouth 3 (three) times daily.        . carvedilol (COREG) 3.125 MG tablet Take 3.125 mg by mouth daily.       Marland Kitchen CIALIS 20 MG tablet       . colchicine 0.6 MG tablet Take 0.6 mg by mouth as needed.        . diphenhydrAMINE (BENADRYL) 25 MG tablet Take 25 mg by mouth at bedtime as needed.        . fish oil-omega-3 fatty acids 1000 MG capsule Take 1 g by mouth daily.       . fluticasone (FLONASE) 50 MCG/ACT nasal spray Place 2 sprays into the nose as needed.       . furosemide (LASIX) 80 MG tablet Take 1 tablet by mouth daily. TAKE 1-2 TABLETS DAILY.      Marland Kitchen insulin glargine (LANTUS SOLOSTAR) 100 UNIT/ML injection        .  levothyroxine (SYNTHROID, LEVOTHROID) 25 MCG tablet Take 25 mcg by mouth daily.       . Nutritional Supplements (NEPRO) LIQD Take by mouth daily.        Marland Kitchen oxyCODONE (OXY IR/ROXICODONE) 5 MG immediate release tablet Take 5 mg by mouth every 6 (six) hours as needed.       . polyethylene glycol (MIRALAX / GLYCOLAX) packet Take 17 g by mouth daily.        . simvastatin (ZOCOR) 20 MG tablet Take 20 mg by mouth at bedtime.       . sorbitol 70 % solution       . temazepam (RESTORIL) 30 MG capsule Take 30 mg by mouth at bedtime as needed.        . traMADol (ULTRAM) 50 MG tablet Take 50 mg by mouth every 6 (six) hours as needed.       . ONE TOUCH ULTRA TEST test strip USE WITH GLUCOSE MONITOR 3 TIMES A DAY  100 each  3    ALLERGIES:  is allergic  to penicillins.  REVIEW OF SYSTEMS:  The rest of the 14-point review of system was negative.   Filed Vitals:   10/03/11 0906  BP: 123/59  Pulse: 70  Temp: 98.4 F (36.9 C)   Wt Readings from Last 3 Encounters:  10/03/11 200 lb 12.8 oz (91.082 kg)  07/30/11 200 lb (90.719 kg)  08/27/11 199 lb (90.266 kg)   ECOG Performance status: 0-1  PHYSICAL EXAMINATION:   General:  well-nourished in no acute distress.  Eyes:  no scleral icterus.  ENT:  There were no oropharyngeal lesions.  Neck was without thyromegaly.  Lymphatics:  Negative cervical, supraclavicular or axillary adenopathy.  Respiratory: lungs were clear bilaterally without wheezing or crackles.  Cardiovascular:  Regular rate and rhythm, S1/S2, without murmur, rub or gallop.  There was no pedal edema.  GI:  abdomen was soft, flat, nontender, nondistended, without organomegaly.  Muscoloskeletal:  no spinal tenderness of palpation of vertebral spine.  Skin exam showed echymosis on extersor surfaces of bilateral arms without petichae.  Neuro exam was nonfocal.  Patient was able to get on and off exam table without assistance.  Gait was normal.  Patient was alerted and oriented.  Attention was good.   Language was appropriate.  Mood was normal without depression.  Speech was not pressured.  Thought content was not tangential.      LABORATORY/RADIOLOGY DATA:  Lab Results  Component Value Date   WBC 4.5 10/03/2011   HGB 9.3* 10/03/2011   HCT 27.6* 10/03/2011   PLT 35* 10/03/2011   GLUCOSE 85 07/30/2011   CHOL  Value: 105        ATP III CLASSIFICATION:  <200     mg/dL   Desirable  200-239  mg/dL   Borderline High  >=240    mg/dL   High        06/15/2010   TRIG 122 06/15/2010   HDL 43 06/15/2010   LDLDIRECT 110.1 01/02/2009   LDLCALC  Value: 38        Total Cholesterol/HDL:CHD Risk Coronary Heart Disease Risk Table                     Men   Women  1/2 Average Risk   3.4   3.3  Average Risk       5.0   4.4  2 X Average Risk   9.6   7.1  3 X  Average Risk  23.4   11.0        Use the calculated Patient Ratio above and the CHD Risk Table to determine the patient's CHD Risk.        ATP III CLASSIFICATION (LDL):  <100     mg/dL   Optimal  100-129  mg/dL   Near or Above                    Optimal  130-159  mg/dL   Borderline  160-189  mg/dL   High  >190     mg/dL   Very High 06/15/2010   ALT 23 07/30/2011   AST 29 07/30/2011   NA 140 07/30/2011   K 3.8 07/30/2011   CL 94* 07/30/2011   CREATININE 4.21* 07/30/2011   BUN 33* 07/30/2011   CO2 35* 07/30/2011   TSH 1.257 07/30/2011   TSH 1.257 07/30/2011   PSA 1.78 10/28/2006   INR 2.4 08/15/2011   HGBA1C  Value: 5.5 (NOTE)                                                                       According to the ADA Clinical Practice Recommendations for 2011, when HbA1c is used as a screening test:   >=6.5%   Diagnostic of Diabetes Mellitus           (if abnormal result  is confirmed)  5.7-6.4%   Increased risk of developing Diabetes Mellitus  References:Diagnosis and Classification of Diabetes Mellitus,Diabetes [id number] 1):S62-S69 and Standards of Medical Care in         Diabetes - 2011,Diabetes [id number]  (Suppl 1):S11-S61. 11/02/2010   MICROALBUR 15.1* 12/07/2009    IMAGING:  Abdominal US 08/13/2011 showed:  1. No evidence of hepatic mass or biliary obstruction. No overt  cirrhotic changes of the liver by ultrasound.  2. Moderate splenomegaly.  3. 2.5 cm complex lesion emanating from the upper pole of the  right kidney. Malignancy is not excluded by ultrasound appearance.  Prior MRI was performed in 2011 without contrast. Recommend  further elective evaluation with MRI of the abdomen with  Gadolinium. Compared to the MRI study, this lesion may have grown  slightly in the interval.   ASSESSMENT AND PLAN:   1. Diabetes mellitus, using insulin sliding scale.  2. Hypertension, well controlled on hemodialysis and Coreg.  3. History of atrial fibrillation.  He is on rate control with  Coreg.  He is in sinus rhythm now.  He is off Coumadin now due to thrombocytopenia.  4. History of aortic valve stenosis, status post porcine valve repair in June 2011.  5. End-stage renal disease from presumed diabetes and hypertension.  He seems to be euvolemic on today's exam.  6. Anemia secondary to end-stage renal disease causing anemia.  He is on Epogen per Nephrology with goal hemoglobin not less than 11.  7. Hypothyroidism.  He is on levothyroxine per PCP.  8. History of coronary artery disease, status post bypass grafting June 2011.  9. History of stroke.  He is on blood pressure control and simvastatin.  10. Thrombocytopenia:  Most likely due to splenic sequestration from history of severe aortic stenosis.  Abdominal US with doppler showed evidence of portal hyptertension,  splenomegaly.  Previous EDG in [place] showed gastric lesions consistent with portal hypertension.  His BM bx in [place] was nondiagnostic.  We could not obtain the aspirate smears from that service despite multiple attempts.  Thus, our pathologist here could not read the BM bx for confirmation.  However, my pretest probability for a primary bone marrow failure state is low.  Other common causes of thrombocytopenia were ruled out.  I advised observation at this time for thrombocytopenia.  If his Plt decrease to less than 20K, I may consider repeating bone marrow biopsy to confirm outside hem evaluation.   11. Right renal mass found on [place] 08/13/11:  This lesion may have increased in size since prior exam.  MRI with gadolinium is contraindicated in patients with endstage renal disease due to concern for nephrogenic systemic fibrosis.  I discussed the case with pt's nephrologist Dr. Lorrene [name].  I personally have the concern that maybe this lesion has increased in size.  She recommended referring patient to Urology to see whether observation is indicated vs. Biopsy vs. Partial nephrectomy.  I made this referral today for within 2-3 wks.     12. Follow up:  CBC with HD center q2wks.  I'll see him in about 3 months.   The length of time of the face-to-face encounter was 45  minutes. More than 50% of time was spent counseling and coordination of care.

## 2011-10-03 NOTE — Telephone Encounter (Signed)
gv pt appt schedule for march. Per [facility] cx mri order pt will have urology consult instead. Pt given appt for 1/18 @ 8:30 am w/ dr Gaynelle Arabian. Pt put on wait list and will be called if a sooner appt becomes available.

## 2011-10-04 ENCOUNTER — Encounter: Payer: Self-pay | Admitting: *Deleted

## 2011-10-04 NOTE — Progress Notes (Signed)
Rec'd CBC from [facility] and forwarded to Dr. Lamonte Sakai.  Hgb 9.5.

## 2011-10-17 ENCOUNTER — Telehealth: Payer: Self-pay | Admitting: Family Medicine

## 2011-10-17 DIAGNOSIS — F419 Anxiety disorder, unspecified: Secondary | ICD-10-CM

## 2011-10-17 NOTE — Telephone Encounter (Signed)
Refill request for Alprazolam 0.5 mg take 1 po q6hrs prn and pt last here on 08/27/11.

## 2011-10-21 MED ORDER — ALPRAZOLAM 0.5 MG PO TABS: 0.5000 mg | ORAL_TABLET | Freq: Four times a day (QID) | ORAL | Status: AC | PRN

## 2011-10-21 NOTE — Telephone Encounter (Signed)
Call in #[phone] with 5 rf 

## 2011-10-21 NOTE — Telephone Encounter (Signed)
Rx called in to pharmacy. 

## 2011-10-23 DIAGNOSIS — N2581 Secondary hyperparathyroidism of renal origin: Secondary | ICD-10-CM | POA: Diagnosis not present

## 2011-10-23 DIAGNOSIS — D509 Iron deficiency anemia, unspecified: Secondary | ICD-10-CM | POA: Diagnosis not present

## 2011-10-23 DIAGNOSIS — N186 End stage renal disease: Secondary | ICD-10-CM | POA: Diagnosis not present

## 2011-10-23 DIAGNOSIS — D631 Anemia in chronic kidney disease: Secondary | ICD-10-CM | POA: Diagnosis not present

## 2011-10-23 DIAGNOSIS — N039 Chronic nephritic syndrome with unspecified morphologic changes: Secondary | ICD-10-CM | POA: Diagnosis not present

## 2011-10-25 DIAGNOSIS — N186 End stage renal disease: Secondary | ICD-10-CM | POA: Diagnosis not present

## 2011-10-25 DIAGNOSIS — N2581 Secondary hyperparathyroidism of renal origin: Secondary | ICD-10-CM | POA: Diagnosis not present

## 2011-10-25 DIAGNOSIS — N039 Chronic nephritic syndrome with unspecified morphologic changes: Secondary | ICD-10-CM | POA: Diagnosis not present

## 2011-10-25 DIAGNOSIS — D509 Iron deficiency anemia, unspecified: Secondary | ICD-10-CM | POA: Diagnosis not present

## 2011-10-28 DIAGNOSIS — N186 End stage renal disease: Secondary | ICD-10-CM | POA: Diagnosis not present

## 2011-10-28 DIAGNOSIS — D509 Iron deficiency anemia, unspecified: Secondary | ICD-10-CM | POA: Diagnosis not present

## 2011-10-28 DIAGNOSIS — N2581 Secondary hyperparathyroidism of renal origin: Secondary | ICD-10-CM | POA: Diagnosis not present

## 2011-10-28 DIAGNOSIS — D631 Anemia in chronic kidney disease: Secondary | ICD-10-CM | POA: Diagnosis not present

## 2011-10-30 DIAGNOSIS — N2581 Secondary hyperparathyroidism of renal origin: Secondary | ICD-10-CM | POA: Diagnosis not present

## 2011-10-30 DIAGNOSIS — N039 Chronic nephritic syndrome with unspecified morphologic changes: Secondary | ICD-10-CM | POA: Diagnosis not present

## 2011-10-30 DIAGNOSIS — N186 End stage renal disease: Secondary | ICD-10-CM | POA: Diagnosis not present

## 2011-10-30 DIAGNOSIS — D509 Iron deficiency anemia, unspecified: Secondary | ICD-10-CM | POA: Diagnosis not present

## 2011-11-01 DIAGNOSIS — D509 Iron deficiency anemia, unspecified: Secondary | ICD-10-CM | POA: Diagnosis not present

## 2011-11-01 DIAGNOSIS — N2581 Secondary hyperparathyroidism of renal origin: Secondary | ICD-10-CM | POA: Diagnosis not present

## 2011-11-01 DIAGNOSIS — N186 End stage renal disease: Secondary | ICD-10-CM | POA: Diagnosis not present

## 2011-11-01 DIAGNOSIS — D631 Anemia in chronic kidney disease: Secondary | ICD-10-CM | POA: Diagnosis not present

## 2011-11-04 DIAGNOSIS — N186 End stage renal disease: Secondary | ICD-10-CM | POA: Diagnosis not present

## 2011-11-04 DIAGNOSIS — N2581 Secondary hyperparathyroidism of renal origin: Secondary | ICD-10-CM | POA: Diagnosis not present

## 2011-11-04 DIAGNOSIS — D509 Iron deficiency anemia, unspecified: Secondary | ICD-10-CM | POA: Diagnosis not present

## 2011-11-04 DIAGNOSIS — D631 Anemia in chronic kidney disease: Secondary | ICD-10-CM | POA: Diagnosis not present

## 2011-11-06 DIAGNOSIS — N186 End stage renal disease: Secondary | ICD-10-CM | POA: Diagnosis not present

## 2011-11-06 DIAGNOSIS — D631 Anemia in chronic kidney disease: Secondary | ICD-10-CM | POA: Diagnosis not present

## 2011-11-06 DIAGNOSIS — D509 Iron deficiency anemia, unspecified: Secondary | ICD-10-CM | POA: Diagnosis not present

## 2011-11-06 DIAGNOSIS — N2581 Secondary hyperparathyroidism of renal origin: Secondary | ICD-10-CM | POA: Diagnosis not present

## 2011-11-08 DIAGNOSIS — D631 Anemia in chronic kidney disease: Secondary | ICD-10-CM | POA: Diagnosis not present

## 2011-11-08 DIAGNOSIS — D509 Iron deficiency anemia, unspecified: Secondary | ICD-10-CM | POA: Diagnosis not present

## 2011-11-08 DIAGNOSIS — N039 Chronic nephritic syndrome with unspecified morphologic changes: Secondary | ICD-10-CM | POA: Diagnosis not present

## 2011-11-08 DIAGNOSIS — N2581 Secondary hyperparathyroidism of renal origin: Secondary | ICD-10-CM | POA: Diagnosis not present

## 2011-11-08 DIAGNOSIS — N186 End stage renal disease: Secondary | ICD-10-CM | POA: Diagnosis not present

## 2011-11-11 DIAGNOSIS — D631 Anemia in chronic kidney disease: Secondary | ICD-10-CM | POA: Diagnosis not present

## 2011-11-11 DIAGNOSIS — N2581 Secondary hyperparathyroidism of renal origin: Secondary | ICD-10-CM | POA: Diagnosis not present

## 2011-11-11 DIAGNOSIS — N186 End stage renal disease: Secondary | ICD-10-CM | POA: Diagnosis not present

## 2011-11-11 DIAGNOSIS — D509 Iron deficiency anemia, unspecified: Secondary | ICD-10-CM | POA: Diagnosis not present

## 2011-11-12 DIAGNOSIS — R161 Splenomegaly, not elsewhere classified: Secondary | ICD-10-CM | POA: Diagnosis not present

## 2011-11-12 DIAGNOSIS — N281 Cyst of kidney, acquired: Secondary | ICD-10-CM | POA: Diagnosis not present

## 2011-11-12 DIAGNOSIS — N189 Chronic kidney disease, unspecified: Secondary | ICD-10-CM | POA: Diagnosis not present

## 2011-11-12 DIAGNOSIS — K8020 Calculus of gallbladder without cholecystitis without obstruction: Secondary | ICD-10-CM | POA: Diagnosis not present

## 2011-11-12 DIAGNOSIS — N289 Disorder of kidney and ureter, unspecified: Secondary | ICD-10-CM | POA: Diagnosis not present

## 2011-11-13 DIAGNOSIS — E1129 Type 2 diabetes mellitus with other diabetic kidney complication: Secondary | ICD-10-CM | POA: Diagnosis not present

## 2011-11-13 DIAGNOSIS — N2581 Secondary hyperparathyroidism of renal origin: Secondary | ICD-10-CM | POA: Diagnosis not present

## 2011-11-13 DIAGNOSIS — D509 Iron deficiency anemia, unspecified: Secondary | ICD-10-CM | POA: Diagnosis not present

## 2011-11-13 DIAGNOSIS — N186 End stage renal disease: Secondary | ICD-10-CM | POA: Diagnosis not present

## 2011-11-13 DIAGNOSIS — D631 Anemia in chronic kidney disease: Secondary | ICD-10-CM | POA: Diagnosis not present

## 2011-11-15 DIAGNOSIS — D631 Anemia in chronic kidney disease: Secondary | ICD-10-CM | POA: Diagnosis not present

## 2011-11-15 DIAGNOSIS — N2581 Secondary hyperparathyroidism of renal origin: Secondary | ICD-10-CM | POA: Diagnosis not present

## 2011-11-15 DIAGNOSIS — D509 Iron deficiency anemia, unspecified: Secondary | ICD-10-CM | POA: Diagnosis not present

## 2011-11-15 DIAGNOSIS — N186 End stage renal disease: Secondary | ICD-10-CM | POA: Diagnosis not present

## 2011-11-18 DIAGNOSIS — N186 End stage renal disease: Secondary | ICD-10-CM | POA: Diagnosis not present

## 2011-11-18 DIAGNOSIS — D509 Iron deficiency anemia, unspecified: Secondary | ICD-10-CM | POA: Diagnosis not present

## 2011-11-18 DIAGNOSIS — D631 Anemia in chronic kidney disease: Secondary | ICD-10-CM | POA: Diagnosis not present

## 2011-11-18 DIAGNOSIS — N2581 Secondary hyperparathyroidism of renal origin: Secondary | ICD-10-CM | POA: Diagnosis not present

## 2011-11-20 DIAGNOSIS — N2581 Secondary hyperparathyroidism of renal origin: Secondary | ICD-10-CM | POA: Diagnosis not present

## 2011-11-20 DIAGNOSIS — N289 Disorder of kidney and ureter, unspecified: Secondary | ICD-10-CM | POA: Diagnosis not present

## 2011-11-20 DIAGNOSIS — D509 Iron deficiency anemia, unspecified: Secondary | ICD-10-CM | POA: Diagnosis not present

## 2011-11-20 DIAGNOSIS — D631 Anemia in chronic kidney disease: Secondary | ICD-10-CM | POA: Diagnosis not present

## 2011-11-20 DIAGNOSIS — N189 Chronic kidney disease, unspecified: Secondary | ICD-10-CM | POA: Diagnosis not present

## 2011-11-20 DIAGNOSIS — N186 End stage renal disease: Secondary | ICD-10-CM | POA: Diagnosis not present

## 2011-11-21 DIAGNOSIS — N186 End stage renal disease: Secondary | ICD-10-CM | POA: Diagnosis not present

## 2011-11-21 NOTE — Progress Notes (Signed)
Received office notes from [facility]; forwarded to Dr. Lamonte Sakai.

## 2011-11-22 DIAGNOSIS — N039 Chronic nephritic syndrome with unspecified morphologic changes: Secondary | ICD-10-CM | POA: Diagnosis not present

## 2011-11-22 DIAGNOSIS — D509 Iron deficiency anemia, unspecified: Secondary | ICD-10-CM | POA: Diagnosis not present

## 2011-11-22 DIAGNOSIS — D631 Anemia in chronic kidney disease: Secondary | ICD-10-CM | POA: Diagnosis not present

## 2011-11-22 DIAGNOSIS — Z23 Encounter for immunization: Secondary | ICD-10-CM | POA: Diagnosis not present

## 2011-11-22 DIAGNOSIS — N186 End stage renal disease: Secondary | ICD-10-CM | POA: Diagnosis not present

## 2011-11-22 DIAGNOSIS — N2581 Secondary hyperparathyroidism of renal origin: Secondary | ICD-10-CM | POA: Diagnosis not present

## 2011-11-25 DIAGNOSIS — N2581 Secondary hyperparathyroidism of renal origin: Secondary | ICD-10-CM | POA: Diagnosis not present

## 2011-11-25 DIAGNOSIS — D509 Iron deficiency anemia, unspecified: Secondary | ICD-10-CM | POA: Diagnosis not present

## 2011-11-25 DIAGNOSIS — Z23 Encounter for immunization: Secondary | ICD-10-CM | POA: Diagnosis not present

## 2011-11-25 DIAGNOSIS — N186 End stage renal disease: Secondary | ICD-10-CM | POA: Diagnosis not present

## 2011-11-25 DIAGNOSIS — D631 Anemia in chronic kidney disease: Secondary | ICD-10-CM | POA: Diagnosis not present

## 2011-11-27 DIAGNOSIS — D509 Iron deficiency anemia, unspecified: Secondary | ICD-10-CM | POA: Diagnosis not present

## 2011-11-27 DIAGNOSIS — N2581 Secondary hyperparathyroidism of renal origin: Secondary | ICD-10-CM | POA: Diagnosis not present

## 2011-11-27 DIAGNOSIS — D631 Anemia in chronic kidney disease: Secondary | ICD-10-CM | POA: Diagnosis not present

## 2011-11-27 DIAGNOSIS — Z23 Encounter for immunization: Secondary | ICD-10-CM | POA: Diagnosis not present

## 2011-11-27 DIAGNOSIS — N186 End stage renal disease: Secondary | ICD-10-CM | POA: Diagnosis not present

## 2011-11-29 DIAGNOSIS — N2581 Secondary hyperparathyroidism of renal origin: Secondary | ICD-10-CM | POA: Diagnosis not present

## 2011-11-29 DIAGNOSIS — D631 Anemia in chronic kidney disease: Secondary | ICD-10-CM | POA: Diagnosis not present

## 2011-11-29 DIAGNOSIS — Z23 Encounter for immunization: Secondary | ICD-10-CM | POA: Diagnosis not present

## 2011-11-29 DIAGNOSIS — N186 End stage renal disease: Secondary | ICD-10-CM | POA: Diagnosis not present

## 2011-11-29 DIAGNOSIS — D509 Iron deficiency anemia, unspecified: Secondary | ICD-10-CM | POA: Diagnosis not present

## 2011-12-02 DIAGNOSIS — D631 Anemia in chronic kidney disease: Secondary | ICD-10-CM | POA: Diagnosis not present

## 2011-12-02 DIAGNOSIS — Z23 Encounter for immunization: Secondary | ICD-10-CM | POA: Diagnosis not present

## 2011-12-02 DIAGNOSIS — N186 End stage renal disease: Secondary | ICD-10-CM | POA: Diagnosis not present

## 2011-12-02 DIAGNOSIS — N2581 Secondary hyperparathyroidism of renal origin: Secondary | ICD-10-CM | POA: Diagnosis not present

## 2011-12-02 DIAGNOSIS — D509 Iron deficiency anemia, unspecified: Secondary | ICD-10-CM | POA: Diagnosis not present

## 2011-12-04 DIAGNOSIS — Z23 Encounter for immunization: Secondary | ICD-10-CM | POA: Diagnosis not present

## 2011-12-04 DIAGNOSIS — N2581 Secondary hyperparathyroidism of renal origin: Secondary | ICD-10-CM | POA: Diagnosis not present

## 2011-12-04 DIAGNOSIS — N039 Chronic nephritic syndrome with unspecified morphologic changes: Secondary | ICD-10-CM | POA: Diagnosis not present

## 2011-12-04 DIAGNOSIS — N186 End stage renal disease: Secondary | ICD-10-CM | POA: Diagnosis not present

## 2011-12-04 DIAGNOSIS — D631 Anemia in chronic kidney disease: Secondary | ICD-10-CM | POA: Diagnosis not present

## 2011-12-04 DIAGNOSIS — D509 Iron deficiency anemia, unspecified: Secondary | ICD-10-CM | POA: Diagnosis not present

## 2011-12-05 ENCOUNTER — Ambulatory Visit: Payer: Self-pay | Admitting: Cardiology

## 2011-12-06 DIAGNOSIS — N186 End stage renal disease: Secondary | ICD-10-CM | POA: Diagnosis not present

## 2011-12-06 DIAGNOSIS — D509 Iron deficiency anemia, unspecified: Secondary | ICD-10-CM | POA: Diagnosis not present

## 2011-12-06 DIAGNOSIS — N039 Chronic nephritic syndrome with unspecified morphologic changes: Secondary | ICD-10-CM | POA: Diagnosis not present

## 2011-12-06 DIAGNOSIS — Z23 Encounter for immunization: Secondary | ICD-10-CM | POA: Diagnosis not present

## 2011-12-06 DIAGNOSIS — N2581 Secondary hyperparathyroidism of renal origin: Secondary | ICD-10-CM | POA: Diagnosis not present

## 2011-12-09 DIAGNOSIS — N186 End stage renal disease: Secondary | ICD-10-CM | POA: Diagnosis not present

## 2011-12-09 DIAGNOSIS — Z23 Encounter for immunization: Secondary | ICD-10-CM | POA: Diagnosis not present

## 2011-12-09 DIAGNOSIS — N2581 Secondary hyperparathyroidism of renal origin: Secondary | ICD-10-CM | POA: Diagnosis not present

## 2011-12-09 DIAGNOSIS — D631 Anemia in chronic kidney disease: Secondary | ICD-10-CM | POA: Diagnosis not present

## 2011-12-09 DIAGNOSIS — D509 Iron deficiency anemia, unspecified: Secondary | ICD-10-CM | POA: Diagnosis not present

## 2011-12-11 DIAGNOSIS — N186 End stage renal disease: Secondary | ICD-10-CM | POA: Diagnosis not present

## 2011-12-11 DIAGNOSIS — D509 Iron deficiency anemia, unspecified: Secondary | ICD-10-CM | POA: Diagnosis not present

## 2011-12-11 DIAGNOSIS — N2581 Secondary hyperparathyroidism of renal origin: Secondary | ICD-10-CM | POA: Diagnosis not present

## 2011-12-11 DIAGNOSIS — D631 Anemia in chronic kidney disease: Secondary | ICD-10-CM | POA: Diagnosis not present

## 2011-12-11 DIAGNOSIS — Z23 Encounter for immunization: Secondary | ICD-10-CM | POA: Diagnosis not present

## 2011-12-13 DIAGNOSIS — D509 Iron deficiency anemia, unspecified: Secondary | ICD-10-CM | POA: Diagnosis not present

## 2011-12-13 DIAGNOSIS — Z23 Encounter for immunization: Secondary | ICD-10-CM | POA: Diagnosis not present

## 2011-12-13 DIAGNOSIS — N2581 Secondary hyperparathyroidism of renal origin: Secondary | ICD-10-CM | POA: Diagnosis not present

## 2011-12-13 DIAGNOSIS — D631 Anemia in chronic kidney disease: Secondary | ICD-10-CM | POA: Diagnosis not present

## 2011-12-13 DIAGNOSIS — N186 End stage renal disease: Secondary | ICD-10-CM | POA: Diagnosis not present

## 2011-12-16 DIAGNOSIS — N2581 Secondary hyperparathyroidism of renal origin: Secondary | ICD-10-CM | POA: Diagnosis not present

## 2011-12-16 DIAGNOSIS — D631 Anemia in chronic kidney disease: Secondary | ICD-10-CM | POA: Diagnosis not present

## 2011-12-16 DIAGNOSIS — Z23 Encounter for immunization: Secondary | ICD-10-CM | POA: Diagnosis not present

## 2011-12-16 DIAGNOSIS — N186 End stage renal disease: Secondary | ICD-10-CM | POA: Diagnosis not present

## 2011-12-16 DIAGNOSIS — D509 Iron deficiency anemia, unspecified: Secondary | ICD-10-CM | POA: Diagnosis not present

## 2011-12-18 DIAGNOSIS — D509 Iron deficiency anemia, unspecified: Secondary | ICD-10-CM | POA: Diagnosis not present

## 2011-12-18 DIAGNOSIS — D631 Anemia in chronic kidney disease: Secondary | ICD-10-CM | POA: Diagnosis not present

## 2011-12-18 DIAGNOSIS — Z23 Encounter for immunization: Secondary | ICD-10-CM | POA: Diagnosis not present

## 2011-12-18 DIAGNOSIS — N186 End stage renal disease: Secondary | ICD-10-CM | POA: Diagnosis not present

## 2011-12-18 DIAGNOSIS — N2581 Secondary hyperparathyroidism of renal origin: Secondary | ICD-10-CM | POA: Diagnosis not present

## 2011-12-19 DIAGNOSIS — N186 End stage renal disease: Secondary | ICD-10-CM | POA: Diagnosis not present

## 2011-12-20 ENCOUNTER — Other Ambulatory Visit: Payer: Self-pay | Admitting: Family Medicine

## 2011-12-20 DIAGNOSIS — N2581 Secondary hyperparathyroidism of renal origin: Secondary | ICD-10-CM | POA: Diagnosis not present

## 2011-12-20 DIAGNOSIS — D631 Anemia in chronic kidney disease: Secondary | ICD-10-CM | POA: Diagnosis not present

## 2011-12-20 DIAGNOSIS — D509 Iron deficiency anemia, unspecified: Secondary | ICD-10-CM | POA: Diagnosis not present

## 2011-12-20 DIAGNOSIS — Z992 Dependence on renal dialysis: Secondary | ICD-10-CM | POA: Diagnosis not present

## 2011-12-20 DIAGNOSIS — I2510 Atherosclerotic heart disease of native coronary artery without angina pectoris: Secondary | ICD-10-CM | POA: Diagnosis not present

## 2011-12-20 DIAGNOSIS — N186 End stage renal disease: Secondary | ICD-10-CM | POA: Diagnosis not present

## 2011-12-20 DIAGNOSIS — I120 Hypertensive chronic kidney disease with stage 5 chronic kidney disease or end stage renal disease: Secondary | ICD-10-CM | POA: Diagnosis not present

## 2011-12-24 ENCOUNTER — Ambulatory Visit (INDEPENDENT_AMBULATORY_CARE_PROVIDER_SITE_OTHER): Payer: Medicare Other | Admitting: Cardiology

## 2011-12-24 ENCOUNTER — Encounter: Payer: Self-pay | Admitting: Cardiology

## 2011-12-24 DIAGNOSIS — I2510 Atherosclerotic heart disease of native coronary artery without angina pectoris: Secondary | ICD-10-CM | POA: Diagnosis not present

## 2011-12-24 DIAGNOSIS — I4891 Unspecified atrial fibrillation: Secondary | ICD-10-CM | POA: Diagnosis not present

## 2011-12-24 DIAGNOSIS — Z952 Presence of prosthetic heart valve: Secondary | ICD-10-CM

## 2011-12-24 NOTE — Assessment & Plan Note (Signed)
The patient has no new sypmtoms.  No further cardiovascular testing is indicated.  We will continue with aggressive risk reduction and meds as listed.  

## 2011-12-24 NOTE — Assessment & Plan Note (Signed)
He has had no evidence or recurrence of this. No change in there is indicated.

## 2011-12-24 NOTE — Progress Notes (Signed)
HPI The patient presents for follow up of his AVR and mild cardiomyopathy.  Since I last saw him he has been tolerating dialysis. His platelets remained low but stable. From a cardiac standpoint his last echo in October demonstrated his EF to be 45% with stable aortic valve replacement. He has had no shortness of breath, PND or orthopnea. He doesn't notice any palpitations and has no symptomatic atrial fibrillation. He thinks he has been in sinus rhythm.  He denies any chest pressure, neck or arm discomfort. He has no PND or orthopnea. He occasionally gets hypotensive with dialysis. His biggest complaint is cramping with dialysis.  Allergies  Allergen Reactions  . Penicillins     REACTION: hives    Current Outpatient Prescriptions  Medication Sig Dispense Refill  . allopurinol (ZYLOPRIM) 100 MG tablet TAKE 1 TABLET BY MOUTH EVERY DAY AS DIRECTED  30 tablet  3  . ALPRAZolam (XANAX) 0.5 MG tablet Take 1 tablet (0.5 mg total) by mouth every 6 (six) hours as needed for sleep or anxiety.  120 tablet  5  . aspirin EC 81 MG tablet Take 1 tablet (81 mg total) by mouth daily.  150 tablet  2  . B Complex-C-Folic Acid (VOL-CARE RX) 1 MG TABS       . B-D ULTRAFINE III SHORT PEN 31G X 8 MM MISC AS DIRECTED  100 each  1  . calcium carbonate (TUMS - DOSED IN MG ELEMENTAL CALCIUM) 500 MG chewable tablet Chew 1 tablet by mouth 3 (three) times daily.        . carvedilol (COREG) 3.125 MG tablet Take 3.125 mg by mouth daily.       . fish oil-omega-3 fatty acids 1000 MG capsule Take 1 g by mouth daily.       . furosemide (LASIX) 80 MG tablet Take 1 tablet by mouth daily. TAKE 1-2 TABLETS DAILY.      Marland Kitchen insulin glargine (LANTUS SOLOSTAR) 100 UNIT/ML injection        . levothyroxine (SYNTHROID, LEVOTHROID) 25 MCG tablet Take 25 mcg by mouth daily.       Marland Kitchen lubiprostone (AMITIZA) 24 MCG capsule Take 24 mcg by mouth 2 (two) times daily with a meal.      . Nutritional Supplements (NEPRO) LIQD Take by mouth daily.         . ONE TOUCH ULTRA TEST test strip USE WITH GLUCOSE MONITOR 3 TIMES A DAY  100 each  3  . oxyCODONE (OXY IR/ROXICODONE) 5 MG immediate release tablet Take 5 mg by mouth every 6 (six) hours as needed.       . simvastatin (ZOCOR) 20 MG tablet Take 20 mg by mouth at bedtime.       . temazepam (RESTORIL) 30 MG capsule Take 30 mg by mouth at bedtime as needed.        . traMADol (ULTRAM) 50 MG tablet Take 50 mg by mouth every 6 (six) hours as needed.         Past Medical History  Diagnosis Date  . CAD (coronary artery disease)   . Hypertension   . Hyperlipidemia   . Myocardial infarction   . Diabetes mellitus   . Gout   . Obesity   . Chronic kidney disease     chronic renal failure  . Atrial fibrillation   . CVA (cerebral infarction) 05/2010  . Thrombocytopenia 2011    sees Dr. Lamonte Sakai  . Splenomegaly 2012  . Anemia associated with chronic renal  failure   . Aortic stenosis     Past Surgical History  Procedure Date  . Appendectomy   . Coronary artery bypass graft     June 2 011 Limited the LAD, SVG to OM, SVG to PDA  . Bone spurs     from right heel  . Aortic valve replacement     Pericardial tissue valve    ROS:  As stated in the HPI and negative for all other systems.  PHYSICAL EXAM BP 120/60  Pulse 67  Ht 5\' 11"  (1.803 m)  Wt 201 lb (91.173 kg)  BMI 28.03 kg/m2 GENERAL:  Well appearing HEENT:  Pupils equal round and reactive, fundi not visualized, oral mucosa unremarkable NECK:  No jugular venous distention, waveform within normal limits, carotid upstroke brisk and symmetric, no bruits, no thyromegaly LYMPHATICS:  No cervical, inguinal adenopathy LUNGS:  Clear to auscultation bilaterally BACK:  No CVA tenderness CHEST:  Well healed sternotomy scar. HEART:  PMI not displaced or sustained,S1 and S2 within normal limits, no S3, no S4, no clicks, no rubs, brief apical systolic murmur ABD:  Flat, positive bowel sounds normal in frequency in pitch, no bruits, no rebound, no  guarding, no midline pulsatile mass, no hepatomegaly, no splenomegaly EXT:  2 plus pulses throughout, right upper dialysis fistula, no edema, no cyanosis no clubbing SKIN:  No rashes no nodules NEURO:  Cranial nerves II through XII grossly intact, motor grossly intact throughout PSYCH:  Cognitively intact, oriented to person place and time  EKG:  Sinus rhythm, right axis deviation, poor anterior R wave progression, premature atrial contractions, rate 67, QTC slightly prolonged, no acute ST-T wave changes. 12/24/2011   ASSESSMENT AND PLAN

## 2011-12-24 NOTE — Patient Instructions (Signed)
The current medical regimen is effective;  continue present plan and medications.  Follow up in 6 months with Dr Hochrein.  You will receive a letter in the mail 2 months before you are due.  Please call us when you receive this letter to schedule your follow up appointment.  

## 2011-12-24 NOTE — Assessment & Plan Note (Signed)
He is stable status post valve replacement. I will likely follow this up with an echocardiogram when I see him next in 6 months.

## 2012-01-01 ENCOUNTER — Telehealth: Payer: Self-pay | Admitting: Cardiology

## 2012-01-01 NOTE — Telephone Encounter (Signed)
Want to talk to Dr Percival Spanish

## 2012-01-01 NOTE — Telephone Encounter (Signed)
New msg Pt wants to talk to you about kidney transplant. Please call him back

## 2012-01-02 ENCOUNTER — Ambulatory Visit (HOSPITAL_BASED_OUTPATIENT_CLINIC_OR_DEPARTMENT_OTHER): Payer: Medicare Other | Admitting: Oncology

## 2012-01-02 ENCOUNTER — Other Ambulatory Visit (HOSPITAL_BASED_OUTPATIENT_CLINIC_OR_DEPARTMENT_OTHER): Payer: Medicare Other | Admitting: Lab

## 2012-01-02 ENCOUNTER — Telehealth: Payer: Self-pay | Admitting: Oncology

## 2012-01-02 ENCOUNTER — Encounter: Payer: Self-pay | Admitting: Oncology

## 2012-01-02 VITALS — BP 118/70 | HR 82 | Temp 99.5°F | Ht 71.0 in | Wt 202.0 lb

## 2012-01-02 DIAGNOSIS — N186 End stage renal disease: Secondary | ICD-10-CM | POA: Diagnosis not present

## 2012-01-02 DIAGNOSIS — D696 Thrombocytopenia, unspecified: Secondary | ICD-10-CM

## 2012-01-02 DIAGNOSIS — N039 Chronic nephritic syndrome with unspecified morphologic changes: Secondary | ICD-10-CM | POA: Diagnosis not present

## 2012-01-02 DIAGNOSIS — E785 Hyperlipidemia, unspecified: Secondary | ICD-10-CM

## 2012-01-02 DIAGNOSIS — N289 Disorder of kidney and ureter, unspecified: Secondary | ICD-10-CM

## 2012-01-02 DIAGNOSIS — D649 Anemia, unspecified: Secondary | ICD-10-CM

## 2012-01-02 DIAGNOSIS — R161 Splenomegaly, not elsewhere classified: Secondary | ICD-10-CM

## 2012-01-02 DIAGNOSIS — D631 Anemia in chronic kidney disease: Secondary | ICD-10-CM | POA: Diagnosis not present

## 2012-01-02 HISTORY — DX: Anemia, unspecified: D64.9

## 2012-01-02 LAB — CBC WITH DIFFERENTIAL/PLATELET
BASO%: 0.5 % (ref 0.0–2.0)
Basophils Absolute: 0 10*3/uL (ref 0.0–0.1)
EOS%: 6.7 % (ref 0.0–7.0)
HCT: 29.5 % — ABNORMAL LOW (ref 38.4–49.9)
LYMPH%: 18.3 % (ref 14.0–49.0)
MCH: 30.9 pg (ref 27.2–33.4)
MCHC: 34.6 g/dL (ref 32.0–36.0)
MCV: 89.4 fL (ref 79.3–98.0)
MONO%: 6.6 % (ref 0.0–14.0)
NEUT%: 67.9 % (ref 39.0–75.0)
Platelets: 39 10*3/uL — ABNORMAL LOW (ref 140–400)
lymph#: 1.1 10*3/uL (ref 0.9–3.3)

## 2012-01-02 LAB — COMPREHENSIVE METABOLIC PANEL
AST: 23 U/L (ref 0–37)
Alkaline Phosphatase: 81 U/L (ref 39–117)
BUN: 35 mg/dL — ABNORMAL HIGH (ref 6–23)
Creatinine, Ser: 4.35 mg/dL — ABNORMAL HIGH (ref 0.50–1.35)
Glucose, Bld: 98 mg/dL (ref 70–99)
Total Bilirubin: 0.6 mg/dL (ref 0.3–1.2)

## 2012-01-02 NOTE — Progress Notes (Signed)
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  INTERVAL HISTORY: [name] Brown 76 y.o. male returns for regular follow up with his girlfriend.  He reports feeling well.  He has been on HD without major problems.  He denies visible source of bleeding.  In terms of his CHF, he is compensated.  He denies SOB, DOE, pedal edema.  He reports mild fatigue specially the day after HD but he is fully functional and is independent of all activities of daily living.    Patient denies headache, visual changes, confusion, drenching night sweats, palpable lymph node swelling, mucositis, odynophagia, dysphagia, nausea vomiting, jaundice, chest pain, palpitation, shortness of breath, dyspnea on exertion, productive cough, gum bleeding, epistaxis, hematemesis, hemoptysis, abdominal pain, abdominal swelling, early satiety, melena, hematochezia, hematuria, skin rash, spontaneous bleeding, joint swelling, joint pain, heat or cold intolerance, bowel bladder incontinence, back pain, focal motor weakness, paresthesia, depression, suicidal or homocidal ideation, feeling hopelessness.  MEDICAL HISTORY: Past Medical History  Diagnosis Date  . CAD (coronary artery disease)   . Hypertension   . Hyperlipidemia   . Myocardial infarction   . Diabetes mellitus   . Gout   . Obesity   . Chronic kidney disease     chronic renal failure  . Atrial fibrillation   . CVA (cerebral infarction) 05/2010  . Thrombocytopenia 2011    sees Dr. Lamonte Sakai  . Splenomegaly 2012  . Anemia associated with chronic renal failure   . Aortic stenosis   . Anemia 01/02/2012  . Thrombocytopenia     SURGICAL HISTORY:  Past Surgical History  Procedure Date  . Appendectomy   . Coronary artery bypass graft     June 2  011 Limited the LAD, SVG to OM, SVG to PDA  . Bone spurs     from right heel  . Aortic valve replacement     Pericardial tissue valve    MEDICATIONS: Current Outpatient Prescriptions  Medication Sig Dispense Refill  . allopurinol (ZYLOPRIM) 100 MG tablet TAKE 1 TABLET BY MOUTH EVERY DAY AS DIRECTED  30 tablet  3  . ALPRAZolam (XANAX) 0.5 MG tablet Take 1 tablet (0.5 mg total) by mouth every 6 (six) hours as needed for sleep or anxiety.  120 tablet  5  . aspirin EC 81 MG tablet Take 1 tablet (81 mg total) by mouth daily.  150 tablet  2  . B Complex-C-Folic Acid (VOL-CARE RX) 1 MG TABS       . B-D ULTRAFINE III SHORT PEN 31G X 8 MM MISC AS DIRECTED  100 each  1  . calcium carbonate (TUMS - DOSED IN MG ELEMENTAL CALCIUM) 500 MG chewable tablet Chew 1 tablet by mouth 3 (three) times daily.        . carvedilol (COREG) 3.125 MG tablet Take 3.125 mg by mouth daily.       . fish oil-omega-3 fatty acids 1000 MG capsule Take 1 g by mouth daily.       . insulin glargine (LANTUS SOLOSTAR) 100 UNIT/ML injection        . levothyroxine (SYNTHROID, LEVOTHROID) 25 MCG tablet Take 25 mcg by mouth daily.       Marland Kitchen lubiprostone (AMITIZA) 24 MCG capsule Take 24 mcg by mouth 2 (two) times daily with a meal.      .  Nutritional Supplements (NEPRO) LIQD Take by mouth daily.        . ONE TOUCH ULTRA TEST test strip USE WITH GLUCOSE MONITOR 3 TIMES A DAY  100 each  3  . oxyCODONE (OXY IR/ROXICODONE) 5 MG immediate release tablet Take 5 mg by mouth every 6 (six) hours as needed.       . simvastatin (ZOCOR) 20 MG tablet Take 20 mg by mouth at bedtime.       . temazepam (RESTORIL) 30 MG capsule Take 30 mg by mouth at bedtime as needed.        . traMADol (ULTRAM) 50 MG tablet Take 50 mg by mouth every 6 (six) hours as needed.       . colchicine 0.6 MG tablet Take 0.6 mg by mouth daily as needed.        ALLERGIES:  is allergic to penicillins.  REVIEW OF SYSTEMS:  The rest of the 14-point review of system was negative.    Filed Vitals:   01/02/12 0823  BP: 118/70  Pulse: 82  Temp: 99.5 F (37.5 C)   Wt Readings from Last 3 Encounters:  01/02/12 202 lb (91.627 kg)  12/24/11 201 lb (91.173 kg)  10/03/11 200 lb 12.8 oz (91.082 kg)   ECOG Performance status: 0-1  PHYSICAL EXAMINATION:   General:  well-nourished in no acute distress.  Eyes:  no scleral icterus.  ENT:  There were no oropharyngeal lesions.  Neck was without thyromegaly.  Lymphatics:  Negative cervical, supraclavicular or axillary adenopathy.  Respiratory: lungs were clear bilaterally without wheezing or crackles.  Cardiovascular:  Regular rate and rhythm, S1/S2, without murmur, rub or gallop.  There was no pedal edema.  GI:  abdomen was soft, flat, nontender, nondistended, without organomegaly.  I could not palpate splenic edge on exam today.  Muscoloskeletal:  no spinal tenderness of palpation of vertebral spine.  Skin exam showed echymosis on extersor surfaces of bilateral arms without petichae.  Neuro exam was nonfocal.  Patient was able to get on and off exam table without assistance.  Gait was normal.  Patient was alerted and oriented.  Attention was good.   Language was appropriate.  Mood was normal without depression.  Speech was not pressured.  Thought content was not tangential.      LABORATORY/RADIOLOGY DATA:  Lab Results  Component Value Date   WBC 5.8 01/02/2012   HGB 10.2* 01/02/2012   HCT 29.5* 01/02/2012   PLT 39* 01/02/2012   GLUCOSE 114* 10/03/2011   CHOL  Value: 105        ATP III CLASSIFICATION:  <200     mg/dL   Desirable  200-239  mg/dL   Borderline High  >=240    mg/dL   High        06/15/2010   TRIG 122 06/15/2010   HDL 43 06/15/2010   LDLDIRECT 110.1 01/02/2009   LDLCALC  Value: 38        Total Cholesterol/HDL:CHD Risk Coronary Heart Disease Risk Table                     Men   Women  1/2 Average Risk   3.4   3.3  Average Risk       5.0   4.4  2 X Average Risk   9.6   7.1  3 X Average Risk  23.4   11.0        Use the  calculated Patient Ratio above and the  CHD Risk Table to determine the patient's CHD Risk.        ATP III CLASSIFICATION (LDL):  <100     mg/dL   Optimal  100-129  mg/dL   Near or Above                    Optimal  130-159  mg/dL   Borderline  160-189  mg/dL   High  >190     mg/dL   Very High 06/15/2010   ALT 11 10/03/2011   AST 21 10/03/2011   NA 145 10/03/2011   K 3.8 10/03/2011   CL 99 10/03/2011   CREATININE 3.88* 10/03/2011   BUN 26* 10/03/2011   CO2 33* 10/03/2011   TSH 1.257 07/30/2011   TSH 1.257 07/30/2011   PSA 1.78 10/28/2006   INR 2.4 08/15/2011   HGBA1C  Value: 5.5 (NOTE)                                                                       According to the ADA Clinical Practice Recommendations for 2011, when HbA1c is used as a screening test:   >=6.5%   Diagnostic of Diabetes Mellitus           (if abnormal result  is confirmed)  5.7-6.4%   Increased risk of developing Diabetes Mellitus  References:Diagnosis and Classification of Diabetes Mellitus,Diabetes [id number] 1):S62-S69 and Standards of Medical Care in         Diabetes - 2011,Diabetes [id number]  (Suppl 1):S11-S61. 11/02/2010   MICROALBUR 15.1* 12/07/2009    ASSESSMENT AND PLAN:   1. Diabetes mellitus, type II:  He is on insulin sliding scale.  2. Hypertension, well controlled on hemodialysis and Coreg.  3. History of atrial fibrillation.  He is on rate control with Coreg.  He is in sinus rhythm now.  He is off Coumadin and amiodarone now due to thrombocytopenia.  4. History of aortic valve stenosis, status post porcine valve repair in June 2011.  5. End-stage renal disease from presumed diabetes and hypertension.  He seems to be euvolemic on today's exam.  6. Anemia secondary to end-stage renal disease causing anemia.  He is on Epogen per Nephrology with goal hemoglobin of 11.  7. Hypothyroidism.  He is on levothyroxine per PCP.  8. History of coronary artery disease, status post bypass grafting June 2011.   9. History of CVA.  He is on blood pressure control with Corgeg and simvastatin.  10. Thrombocytopenia:      Differential diagnosis:  hyperslenism from history of severe AS and hepatic congestion from renal failure.  Bone marrow biopsy in [place] in 2012 was reportedly negative.  Other reversible causes of thrombocytopenia had been ruled out.     Treatment:  He is not symptomatic from the thrombocytopenia.  I recommended to repeat bone marrow biopsy since his plt is down trending toward 30's range which can predispose him to spontaneous hemorrhage in addition to rule out any primary bone marrow pathology that may not have been obvious with the last bone marrow biopsy.  He prefers to wait for his evaluation with Renal Transplant team in May 2013.   11. Right renal mass found on [place] 08/13/11:  Thought to be benign  cyst per Urology.  He is on observation.   12. Follow up:  CBC with HD center q2wks.  I'll see him in about 6 months.   The length of time of the face-to-face encounter was 15  minutes. More than 50% of time was spent counseling and coordination of care.

## 2012-01-02 NOTE — Telephone Encounter (Signed)
appts made and printed for 06/25/12  aom

## 2012-01-03 ENCOUNTER — Encounter: Payer: Self-pay | Admitting: Cardiology

## 2012-01-07 ENCOUNTER — Telehealth: Payer: Self-pay | Admitting: Cardiology

## 2012-01-07 NOTE — Telephone Encounter (Signed)
Pt signed ROI, Mailed Echo to Home address 01/07/12/KM

## 2012-01-14 ENCOUNTER — Other Ambulatory Visit: Payer: Self-pay | Admitting: *Deleted

## 2012-01-14 DIAGNOSIS — I6529 Occlusion and stenosis of unspecified carotid artery: Secondary | ICD-10-CM

## 2012-01-15 ENCOUNTER — Encounter (INDEPENDENT_AMBULATORY_CARE_PROVIDER_SITE_OTHER): Payer: Medicare Other

## 2012-01-15 DIAGNOSIS — I6529 Occlusion and stenosis of unspecified carotid artery: Secondary | ICD-10-CM

## 2012-01-19 DIAGNOSIS — N186 End stage renal disease: Secondary | ICD-10-CM | POA: Diagnosis not present

## 2012-01-20 DIAGNOSIS — D509 Iron deficiency anemia, unspecified: Secondary | ICD-10-CM | POA: Diagnosis not present

## 2012-01-20 DIAGNOSIS — Z992 Dependence on renal dialysis: Secondary | ICD-10-CM | POA: Diagnosis not present

## 2012-01-20 DIAGNOSIS — N2581 Secondary hyperparathyroidism of renal origin: Secondary | ICD-10-CM | POA: Diagnosis not present

## 2012-01-20 DIAGNOSIS — N186 End stage renal disease: Secondary | ICD-10-CM | POA: Diagnosis not present

## 2012-01-20 DIAGNOSIS — D539 Nutritional anemia, unspecified: Secondary | ICD-10-CM | POA: Diagnosis not present

## 2012-01-20 DIAGNOSIS — D631 Anemia in chronic kidney disease: Secondary | ICD-10-CM | POA: Diagnosis not present

## 2012-01-20 HISTORY — PX: KIDNEY TRANSPLANT: SHX239

## 2012-02-03 NOTE — Progress Notes (Signed)
Received lab results from [facility]; forwarded to Dr. Lamonte Sakai.

## 2012-02-04 ENCOUNTER — Telehealth: Payer: Self-pay

## 2012-02-04 NOTE — Telephone Encounter (Signed)
Message copied by Azzie Glatter on Tue Feb 04, 2012  9:48 AM ------      Message from: HA, Trudee Grip T      Created: Mon Feb 03, 2012  1:36 PM       Please call pt.            His outside CBC showed      WBC 5.5; stable.      Hgb 9.8; anemic; but stable.  Continue Erythropoetin with HD to improved this.      Plt 74:  Low but improved compared to last time.                        Continue observation from Hem standpoint.  In the future, if his plt significantly worsens, we may consider repeating bone marrow biopsy.             Thanks.

## 2012-02-12 DIAGNOSIS — E1129 Type 2 diabetes mellitus with other diabetic kidney complication: Secondary | ICD-10-CM | POA: Diagnosis not present

## 2012-02-13 ENCOUNTER — Other Ambulatory Visit: Payer: Self-pay | Admitting: Family Medicine

## 2012-02-17 DIAGNOSIS — N186 End stage renal disease: Secondary | ICD-10-CM | POA: Diagnosis not present

## 2012-02-17 DIAGNOSIS — IMO0001 Reserved for inherently not codable concepts without codable children: Secondary | ICD-10-CM | POA: Diagnosis not present

## 2012-02-17 DIAGNOSIS — D899 Disorder involving the immune mechanism, unspecified: Secondary | ICD-10-CM | POA: Diagnosis not present

## 2012-02-17 DIAGNOSIS — Z8673 Personal history of transient ischemic attack (TIA), and cerebral infarction without residual deficits: Secondary | ICD-10-CM | POA: Diagnosis not present

## 2012-02-17 DIAGNOSIS — E8779 Other fluid overload: Secondary | ICD-10-CM | POA: Diagnosis not present

## 2012-02-17 DIAGNOSIS — Z992 Dependence on renal dialysis: Secondary | ICD-10-CM | POA: Diagnosis not present

## 2012-02-17 DIAGNOSIS — I4891 Unspecified atrial fibrillation: Secondary | ICD-10-CM | POA: Diagnosis not present

## 2012-02-17 DIAGNOSIS — N058 Unspecified nephritic syndrome with other morphologic changes: Secondary | ICD-10-CM | POA: Diagnosis not present

## 2012-02-17 DIAGNOSIS — Z880 Allergy status to penicillin: Secondary | ICD-10-CM | POA: Diagnosis not present

## 2012-02-17 DIAGNOSIS — J811 Chronic pulmonary edema: Secondary | ICD-10-CM | POA: Diagnosis not present

## 2012-02-17 DIAGNOSIS — Z954 Presence of other heart-valve replacement: Secondary | ICD-10-CM | POA: Diagnosis not present

## 2012-02-17 DIAGNOSIS — R0989 Other specified symptoms and signs involving the circulatory and respiratory systems: Secondary | ICD-10-CM | POA: Diagnosis not present

## 2012-02-17 DIAGNOSIS — D62 Acute posthemorrhagic anemia: Secondary | ICD-10-CM | POA: Diagnosis not present

## 2012-02-17 DIAGNOSIS — K5900 Constipation, unspecified: Secondary | ICD-10-CM | POA: Diagnosis not present

## 2012-02-17 DIAGNOSIS — Z951 Presence of aortocoronary bypass graft: Secondary | ICD-10-CM | POA: Diagnosis not present

## 2012-02-17 DIAGNOSIS — R609 Edema, unspecified: Secondary | ICD-10-CM | POA: Diagnosis not present

## 2012-02-17 DIAGNOSIS — I509 Heart failure, unspecified: Secondary | ICD-10-CM | POA: Diagnosis not present

## 2012-02-17 DIAGNOSIS — Z452 Encounter for adjustment and management of vascular access device: Secondary | ICD-10-CM | POA: Diagnosis not present

## 2012-02-17 DIAGNOSIS — Z79899 Other long term (current) drug therapy: Secondary | ICD-10-CM | POA: Diagnosis not present

## 2012-02-17 DIAGNOSIS — E1149 Type 2 diabetes mellitus with other diabetic neurological complication: Secondary | ICD-10-CM | POA: Diagnosis not present

## 2012-02-17 DIAGNOSIS — D696 Thrombocytopenia, unspecified: Secondary | ICD-10-CM | POA: Diagnosis not present

## 2012-02-17 DIAGNOSIS — E875 Hyperkalemia: Secondary | ICD-10-CM | POA: Diagnosis not present

## 2012-02-17 DIAGNOSIS — D631 Anemia in chronic kidney disease: Secondary | ICD-10-CM | POA: Diagnosis not present

## 2012-02-17 DIAGNOSIS — I10 Essential (primary) hypertension: Secondary | ICD-10-CM | POA: Diagnosis not present

## 2012-02-17 DIAGNOSIS — Z01818 Encounter for other preprocedural examination: Secondary | ICD-10-CM | POA: Diagnosis not present

## 2012-02-17 DIAGNOSIS — Z940 Kidney transplant status: Secondary | ICD-10-CM | POA: Diagnosis not present

## 2012-02-17 DIAGNOSIS — I2510 Atherosclerotic heart disease of native coronary artery without angina pectoris: Secondary | ICD-10-CM | POA: Diagnosis present

## 2012-02-17 DIAGNOSIS — E1142 Type 2 diabetes mellitus with diabetic polyneuropathy: Secondary | ICD-10-CM | POA: Diagnosis not present

## 2012-02-17 DIAGNOSIS — M109 Gout, unspecified: Secondary | ICD-10-CM | POA: Diagnosis not present

## 2012-02-17 DIAGNOSIS — E119 Type 2 diabetes mellitus without complications: Secondary | ICD-10-CM | POA: Diagnosis not present

## 2012-02-17 DIAGNOSIS — E039 Hypothyroidism, unspecified: Secondary | ICD-10-CM | POA: Diagnosis present

## 2012-02-17 DIAGNOSIS — E1129 Type 2 diabetes mellitus with other diabetic kidney complication: Secondary | ICD-10-CM | POA: Diagnosis not present

## 2012-02-17 DIAGNOSIS — Z7982 Long term (current) use of aspirin: Secondary | ICD-10-CM | POA: Diagnosis not present

## 2012-02-17 DIAGNOSIS — I1311 Hypertensive heart and chronic kidney disease without heart failure, with stage 5 chronic kidney disease, or end stage renal disease: Secondary | ICD-10-CM | POA: Diagnosis not present

## 2012-02-18 DIAGNOSIS — N186 End stage renal disease: Secondary | ICD-10-CM | POA: Diagnosis not present

## 2012-02-25 DIAGNOSIS — Z79899 Other long term (current) drug therapy: Secondary | ICD-10-CM | POA: Diagnosis not present

## 2012-02-25 DIAGNOSIS — Z48298 Encounter for aftercare following other organ transplant: Secondary | ICD-10-CM | POA: Diagnosis not present

## 2012-02-25 DIAGNOSIS — Z940 Kidney transplant status: Secondary | ICD-10-CM | POA: Diagnosis not present

## 2012-02-28 DIAGNOSIS — M109 Gout, unspecified: Secondary | ICD-10-CM | POA: Diagnosis not present

## 2012-02-28 DIAGNOSIS — Z48298 Encounter for aftercare following other organ transplant: Secondary | ICD-10-CM | POA: Diagnosis not present

## 2012-02-28 DIAGNOSIS — E118 Type 2 diabetes mellitus with unspecified complications: Secondary | ICD-10-CM | POA: Diagnosis not present

## 2012-02-28 DIAGNOSIS — Z940 Kidney transplant status: Secondary | ICD-10-CM | POA: Diagnosis not present

## 2012-02-28 DIAGNOSIS — N058 Unspecified nephritic syndrome with other morphologic changes: Secondary | ICD-10-CM | POA: Diagnosis not present

## 2012-02-28 DIAGNOSIS — E1129 Type 2 diabetes mellitus with other diabetic kidney complication: Secondary | ICD-10-CM | POA: Diagnosis not present

## 2012-02-28 DIAGNOSIS — Z79899 Other long term (current) drug therapy: Secondary | ICD-10-CM | POA: Diagnosis not present

## 2012-02-28 DIAGNOSIS — D696 Thrombocytopenia, unspecified: Secondary | ICD-10-CM | POA: Diagnosis not present

## 2012-02-28 DIAGNOSIS — Z951 Presence of aortocoronary bypass graft: Secondary | ICD-10-CM | POA: Diagnosis not present

## 2012-02-28 DIAGNOSIS — I4891 Unspecified atrial fibrillation: Secondary | ICD-10-CM | POA: Diagnosis not present

## 2012-03-02 DIAGNOSIS — Z940 Kidney transplant status: Secondary | ICD-10-CM | POA: Diagnosis not present

## 2012-03-02 DIAGNOSIS — Z79899 Other long term (current) drug therapy: Secondary | ICD-10-CM | POA: Diagnosis not present

## 2012-03-02 DIAGNOSIS — D649 Anemia, unspecified: Secondary | ICD-10-CM | POA: Diagnosis not present

## 2012-03-02 DIAGNOSIS — Z48298 Encounter for aftercare following other organ transplant: Secondary | ICD-10-CM | POA: Diagnosis not present

## 2012-03-02 DIAGNOSIS — I10 Essential (primary) hypertension: Secondary | ICD-10-CM | POA: Diagnosis not present

## 2012-03-02 DIAGNOSIS — E119 Type 2 diabetes mellitus without complications: Secondary | ICD-10-CM | POA: Diagnosis not present

## 2012-03-06 DIAGNOSIS — Z940 Kidney transplant status: Secondary | ICD-10-CM | POA: Diagnosis not present

## 2012-03-06 DIAGNOSIS — Z48298 Encounter for aftercare following other organ transplant: Secondary | ICD-10-CM | POA: Diagnosis not present

## 2012-03-06 DIAGNOSIS — Z79899 Other long term (current) drug therapy: Secondary | ICD-10-CM | POA: Diagnosis not present

## 2012-03-10 DIAGNOSIS — Z940 Kidney transplant status: Secondary | ICD-10-CM | POA: Diagnosis not present

## 2012-03-10 DIAGNOSIS — Z48298 Encounter for aftercare following other organ transplant: Secondary | ICD-10-CM | POA: Diagnosis not present

## 2012-03-10 DIAGNOSIS — D899 Disorder involving the immune mechanism, unspecified: Secondary | ICD-10-CM | POA: Diagnosis not present

## 2012-03-10 DIAGNOSIS — E119 Type 2 diabetes mellitus without complications: Secondary | ICD-10-CM | POA: Diagnosis not present

## 2012-03-10 DIAGNOSIS — D649 Anemia, unspecified: Secondary | ICD-10-CM | POA: Diagnosis not present

## 2012-03-10 DIAGNOSIS — Z79899 Other long term (current) drug therapy: Secondary | ICD-10-CM | POA: Diagnosis not present

## 2012-03-10 DIAGNOSIS — I10 Essential (primary) hypertension: Secondary | ICD-10-CM | POA: Diagnosis not present

## 2012-03-10 DIAGNOSIS — D696 Thrombocytopenia, unspecified: Secondary | ICD-10-CM | POA: Diagnosis not present

## 2012-03-13 DIAGNOSIS — D649 Anemia, unspecified: Secondary | ICD-10-CM | POA: Diagnosis not present

## 2012-03-13 DIAGNOSIS — Z48298 Encounter for aftercare following other organ transplant: Secondary | ICD-10-CM | POA: Diagnosis not present

## 2012-03-13 DIAGNOSIS — M109 Gout, unspecified: Secondary | ICD-10-CM | POA: Diagnosis not present

## 2012-03-13 DIAGNOSIS — Z940 Kidney transplant status: Secondary | ICD-10-CM | POA: Diagnosis not present

## 2012-03-13 DIAGNOSIS — I10 Essential (primary) hypertension: Secondary | ICD-10-CM | POA: Diagnosis not present

## 2012-03-13 DIAGNOSIS — N058 Unspecified nephritic syndrome with other morphologic changes: Secondary | ICD-10-CM | POA: Diagnosis not present

## 2012-03-13 DIAGNOSIS — E118 Type 2 diabetes mellitus with unspecified complications: Secondary | ICD-10-CM | POA: Diagnosis not present

## 2012-03-13 DIAGNOSIS — E119 Type 2 diabetes mellitus without complications: Secondary | ICD-10-CM | POA: Diagnosis not present

## 2012-03-13 DIAGNOSIS — E1129 Type 2 diabetes mellitus with other diabetic kidney complication: Secondary | ICD-10-CM | POA: Diagnosis not present

## 2012-03-13 DIAGNOSIS — Z79899 Other long term (current) drug therapy: Secondary | ICD-10-CM | POA: Diagnosis not present

## 2012-03-13 DIAGNOSIS — R791 Abnormal coagulation profile: Secondary | ICD-10-CM | POA: Diagnosis not present

## 2012-03-17 DIAGNOSIS — E119 Type 2 diabetes mellitus without complications: Secondary | ICD-10-CM | POA: Diagnosis not present

## 2012-03-17 DIAGNOSIS — D649 Anemia, unspecified: Secondary | ICD-10-CM | POA: Diagnosis not present

## 2012-03-17 DIAGNOSIS — E1129 Type 2 diabetes mellitus with other diabetic kidney complication: Secondary | ICD-10-CM | POA: Diagnosis not present

## 2012-03-17 DIAGNOSIS — Z79899 Other long term (current) drug therapy: Secondary | ICD-10-CM | POA: Diagnosis not present

## 2012-03-17 DIAGNOSIS — N058 Unspecified nephritic syndrome with other morphologic changes: Secondary | ICD-10-CM | POA: Diagnosis not present

## 2012-03-17 DIAGNOSIS — Z48298 Encounter for aftercare following other organ transplant: Secondary | ICD-10-CM | POA: Diagnosis not present

## 2012-03-17 DIAGNOSIS — Z940 Kidney transplant status: Secondary | ICD-10-CM | POA: Diagnosis not present

## 2012-03-17 DIAGNOSIS — M109 Gout, unspecified: Secondary | ICD-10-CM | POA: Diagnosis not present

## 2012-03-17 DIAGNOSIS — I10 Essential (primary) hypertension: Secondary | ICD-10-CM | POA: Diagnosis not present

## 2012-03-17 DIAGNOSIS — D696 Thrombocytopenia, unspecified: Secondary | ICD-10-CM | POA: Diagnosis not present

## 2012-03-19 DIAGNOSIS — Z940 Kidney transplant status: Secondary | ICD-10-CM | POA: Diagnosis not present

## 2012-03-19 DIAGNOSIS — Z79899 Other long term (current) drug therapy: Secondary | ICD-10-CM | POA: Diagnosis not present

## 2012-03-20 DIAGNOSIS — I10 Essential (primary) hypertension: Secondary | ICD-10-CM | POA: Diagnosis not present

## 2012-03-20 DIAGNOSIS — Z48298 Encounter for aftercare following other organ transplant: Secondary | ICD-10-CM | POA: Diagnosis not present

## 2012-03-20 DIAGNOSIS — E118 Type 2 diabetes mellitus with unspecified complications: Secondary | ICD-10-CM | POA: Diagnosis not present

## 2012-03-20 DIAGNOSIS — D696 Thrombocytopenia, unspecified: Secondary | ICD-10-CM | POA: Diagnosis not present

## 2012-03-20 DIAGNOSIS — N058 Unspecified nephritic syndrome with other morphologic changes: Secondary | ICD-10-CM | POA: Diagnosis not present

## 2012-03-20 DIAGNOSIS — I4891 Unspecified atrial fibrillation: Secondary | ICD-10-CM | POA: Diagnosis not present

## 2012-03-20 DIAGNOSIS — D649 Anemia, unspecified: Secondary | ICD-10-CM | POA: Diagnosis not present

## 2012-03-20 DIAGNOSIS — M109 Gout, unspecified: Secondary | ICD-10-CM | POA: Diagnosis not present

## 2012-03-20 DIAGNOSIS — E1129 Type 2 diabetes mellitus with other diabetic kidney complication: Secondary | ICD-10-CM | POA: Diagnosis not present

## 2012-03-20 DIAGNOSIS — Z940 Kidney transplant status: Secondary | ICD-10-CM | POA: Diagnosis not present

## 2012-03-20 DIAGNOSIS — Z79899 Other long term (current) drug therapy: Secondary | ICD-10-CM | POA: Diagnosis not present

## 2012-03-20 DIAGNOSIS — Z951 Presence of aortocoronary bypass graft: Secondary | ICD-10-CM | POA: Diagnosis not present

## 2012-03-24 DIAGNOSIS — D649 Anemia, unspecified: Secondary | ICD-10-CM | POA: Diagnosis not present

## 2012-03-24 DIAGNOSIS — E1129 Type 2 diabetes mellitus with other diabetic kidney complication: Secondary | ICD-10-CM | POA: Diagnosis not present

## 2012-03-24 DIAGNOSIS — E119 Type 2 diabetes mellitus without complications: Secondary | ICD-10-CM | POA: Diagnosis not present

## 2012-03-24 DIAGNOSIS — N058 Unspecified nephritic syndrome with other morphologic changes: Secondary | ICD-10-CM | POA: Diagnosis not present

## 2012-03-24 DIAGNOSIS — Z951 Presence of aortocoronary bypass graft: Secondary | ICD-10-CM | POA: Diagnosis not present

## 2012-03-24 DIAGNOSIS — I10 Essential (primary) hypertension: Secondary | ICD-10-CM | POA: Diagnosis not present

## 2012-03-24 DIAGNOSIS — D696 Thrombocytopenia, unspecified: Secondary | ICD-10-CM | POA: Diagnosis not present

## 2012-03-24 DIAGNOSIS — Z940 Kidney transplant status: Secondary | ICD-10-CM | POA: Diagnosis not present

## 2012-03-24 DIAGNOSIS — Z79899 Other long term (current) drug therapy: Secondary | ICD-10-CM | POA: Diagnosis not present

## 2012-03-24 DIAGNOSIS — Z48298 Encounter for aftercare following other organ transplant: Secondary | ICD-10-CM | POA: Diagnosis not present

## 2012-03-30 DIAGNOSIS — D899 Disorder involving the immune mechanism, unspecified: Secondary | ICD-10-CM | POA: Diagnosis not present

## 2012-03-30 DIAGNOSIS — Z951 Presence of aortocoronary bypass graft: Secondary | ICD-10-CM | POA: Diagnosis not present

## 2012-03-30 DIAGNOSIS — Z794 Long term (current) use of insulin: Secondary | ICD-10-CM | POA: Diagnosis not present

## 2012-03-30 DIAGNOSIS — I120 Hypertensive chronic kidney disease with stage 5 chronic kidney disease or end stage renal disease: Secondary | ICD-10-CM | POA: Diagnosis not present

## 2012-03-30 DIAGNOSIS — Z7982 Long term (current) use of aspirin: Secondary | ICD-10-CM | POA: Diagnosis not present

## 2012-03-30 DIAGNOSIS — N186 End stage renal disease: Secondary | ICD-10-CM | POA: Diagnosis not present

## 2012-03-30 DIAGNOSIS — Z880 Allergy status to penicillin: Secondary | ICD-10-CM | POA: Diagnosis not present

## 2012-03-30 DIAGNOSIS — Z79899 Other long term (current) drug therapy: Secondary | ICD-10-CM | POA: Diagnosis not present

## 2012-03-30 DIAGNOSIS — I10 Essential (primary) hypertension: Secondary | ICD-10-CM | POA: Diagnosis not present

## 2012-03-30 DIAGNOSIS — D649 Anemia, unspecified: Secondary | ICD-10-CM | POA: Diagnosis not present

## 2012-03-30 DIAGNOSIS — Z940 Kidney transplant status: Secondary | ICD-10-CM | POA: Diagnosis not present

## 2012-03-30 DIAGNOSIS — Z954 Presence of other heart-valve replacement: Secondary | ICD-10-CM | POA: Diagnosis not present

## 2012-03-30 DIAGNOSIS — E119 Type 2 diabetes mellitus without complications: Secondary | ICD-10-CM | POA: Diagnosis not present

## 2012-04-06 DIAGNOSIS — I4891 Unspecified atrial fibrillation: Secondary | ICD-10-CM | POA: Diagnosis not present

## 2012-04-06 DIAGNOSIS — I10 Essential (primary) hypertension: Secondary | ICD-10-CM | POA: Diagnosis not present

## 2012-04-06 DIAGNOSIS — Z940 Kidney transplant status: Secondary | ICD-10-CM | POA: Diagnosis not present

## 2012-04-06 DIAGNOSIS — D649 Anemia, unspecified: Secondary | ICD-10-CM | POA: Diagnosis not present

## 2012-04-06 DIAGNOSIS — Z48298 Encounter for aftercare following other organ transplant: Secondary | ICD-10-CM | POA: Diagnosis not present

## 2012-04-06 DIAGNOSIS — E119 Type 2 diabetes mellitus without complications: Secondary | ICD-10-CM | POA: Diagnosis not present

## 2012-04-06 DIAGNOSIS — I2510 Atherosclerotic heart disease of native coronary artery without angina pectoris: Secondary | ICD-10-CM | POA: Diagnosis not present

## 2012-04-06 DIAGNOSIS — Z79899 Other long term (current) drug therapy: Secondary | ICD-10-CM | POA: Diagnosis not present

## 2012-04-19 ENCOUNTER — Other Ambulatory Visit: Payer: Self-pay | Admitting: Family Medicine

## 2012-04-20 DIAGNOSIS — Z940 Kidney transplant status: Secondary | ICD-10-CM | POA: Diagnosis not present

## 2012-04-20 DIAGNOSIS — E119 Type 2 diabetes mellitus without complications: Secondary | ICD-10-CM | POA: Diagnosis not present

## 2012-04-20 DIAGNOSIS — I10 Essential (primary) hypertension: Secondary | ICD-10-CM | POA: Diagnosis not present

## 2012-04-20 DIAGNOSIS — D696 Thrombocytopenia, unspecified: Secondary | ICD-10-CM | POA: Diagnosis not present

## 2012-04-20 DIAGNOSIS — Z79899 Other long term (current) drug therapy: Secondary | ICD-10-CM | POA: Diagnosis not present

## 2012-04-20 DIAGNOSIS — Z48298 Encounter for aftercare following other organ transplant: Secondary | ICD-10-CM | POA: Diagnosis not present

## 2012-04-20 DIAGNOSIS — Z951 Presence of aortocoronary bypass graft: Secondary | ICD-10-CM | POA: Diagnosis not present

## 2012-04-20 DIAGNOSIS — D899 Disorder involving the immune mechanism, unspecified: Secondary | ICD-10-CM | POA: Diagnosis not present

## 2012-04-20 DIAGNOSIS — N058 Unspecified nephritic syndrome with other morphologic changes: Secondary | ICD-10-CM | POA: Diagnosis not present

## 2012-04-20 DIAGNOSIS — Z125 Encounter for screening for malignant neoplasm of prostate: Secondary | ICD-10-CM | POA: Diagnosis not present

## 2012-04-20 DIAGNOSIS — D649 Anemia, unspecified: Secondary | ICD-10-CM | POA: Diagnosis not present

## 2012-04-20 DIAGNOSIS — M109 Gout, unspecified: Secondary | ICD-10-CM | POA: Diagnosis not present

## 2012-04-20 DIAGNOSIS — E1129 Type 2 diabetes mellitus with other diabetic kidney complication: Secondary | ICD-10-CM | POA: Diagnosis not present

## 2012-05-04 ENCOUNTER — Encounter: Payer: Self-pay | Admitting: Gastroenterology

## 2012-05-04 ENCOUNTER — Other Ambulatory Visit: Payer: Self-pay | Admitting: Oncology

## 2012-05-04 DIAGNOSIS — Z48298 Encounter for aftercare following other organ transplant: Secondary | ICD-10-CM | POA: Diagnosis not present

## 2012-05-04 DIAGNOSIS — Z79899 Other long term (current) drug therapy: Secondary | ICD-10-CM | POA: Diagnosis not present

## 2012-05-04 DIAGNOSIS — I10 Essential (primary) hypertension: Secondary | ICD-10-CM | POA: Diagnosis not present

## 2012-05-04 DIAGNOSIS — Z940 Kidney transplant status: Secondary | ICD-10-CM | POA: Diagnosis not present

## 2012-05-05 ENCOUNTER — Encounter: Payer: Self-pay | Admitting: Family Medicine

## 2012-05-05 ENCOUNTER — Ambulatory Visit (INDEPENDENT_AMBULATORY_CARE_PROVIDER_SITE_OTHER): Payer: Medicare Other | Admitting: Family Medicine

## 2012-05-05 VITALS — BP 112/62 | HR 71 | Temp 98.8°F | Wt 211.0 lb

## 2012-05-05 DIAGNOSIS — Z940 Kidney transplant status: Secondary | ICD-10-CM

## 2012-05-05 DIAGNOSIS — I10 Essential (primary) hypertension: Secondary | ICD-10-CM | POA: Diagnosis not present

## 2012-05-05 DIAGNOSIS — I2510 Atherosclerotic heart disease of native coronary artery without angina pectoris: Secondary | ICD-10-CM | POA: Diagnosis not present

## 2012-05-05 DIAGNOSIS — N259 Disorder resulting from impaired renal tubular function, unspecified: Secondary | ICD-10-CM

## 2012-05-05 DIAGNOSIS — E119 Type 2 diabetes mellitus without complications: Secondary | ICD-10-CM

## 2012-05-05 NOTE — Progress Notes (Signed)
  Subjective:    Patient ID: [name] Brown, male    DOB: [birth year], 76 y.o.   MRN: [id number]  HPI Here to follow up after a kidney transplant at [facility] on 01-20-12. This went very well, and he is off dialysis. He is tested every 2 weeks to watch for any signs of rejection. He feels well. He has only mild edema of the feet and lower legs. No SOB. His weight is stable.    Review of Systems  Constitutional: Negative.   Respiratory: Negative.   Cardiovascular: Negative.   Gastrointestinal: Negative.   Genitourinary: Negative.   Skin: Negative.        Objective:   Physical Exam  Constitutional: He is oriented to person, place, and time. He appears well-developed and well-nourished.  Cardiovascular: Normal rate, regular rhythm and intact distal pulses.  Exam reveals no gallop and no friction rub.        Soft 2/6 SM over the aortic area   Pulmonary/Chest: Effort normal and breath sounds normal. No respiratory distress. He has no wheezes. He has no rales.  Neurological: He is alert and oriented to person, place, and time.  Skin:       Skin shows no suspicious lesions.           Assessment & Plan:  He seems to be doing well. He is to see Dr.  Percival Spanish soon.

## 2012-05-06 DIAGNOSIS — B9789 Other viral agents as the cause of diseases classified elsewhere: Secondary | ICD-10-CM | POA: Diagnosis not present

## 2012-05-06 DIAGNOSIS — Z7982 Long term (current) use of aspirin: Secondary | ICD-10-CM | POA: Diagnosis not present

## 2012-05-06 DIAGNOSIS — Z79899 Other long term (current) drug therapy: Secondary | ICD-10-CM | POA: Diagnosis not present

## 2012-05-06 DIAGNOSIS — Z792 Long term (current) use of antibiotics: Secondary | ICD-10-CM | POA: Diagnosis not present

## 2012-05-06 DIAGNOSIS — E118 Type 2 diabetes mellitus with unspecified complications: Secondary | ICD-10-CM | POA: Diagnosis not present

## 2012-05-06 DIAGNOSIS — Z940 Kidney transplant status: Secondary | ICD-10-CM | POA: Diagnosis not present

## 2012-05-06 DIAGNOSIS — Z794 Long term (current) use of insulin: Secondary | ICD-10-CM | POA: Diagnosis not present

## 2012-05-13 DIAGNOSIS — E119 Type 2 diabetes mellitus without complications: Secondary | ICD-10-CM | POA: Diagnosis not present

## 2012-05-13 DIAGNOSIS — I10 Essential (primary) hypertension: Secondary | ICD-10-CM | POA: Diagnosis not present

## 2012-05-13 DIAGNOSIS — Z940 Kidney transplant status: Secondary | ICD-10-CM | POA: Diagnosis not present

## 2012-05-13 DIAGNOSIS — Z48298 Encounter for aftercare following other organ transplant: Secondary | ICD-10-CM | POA: Diagnosis not present

## 2012-05-13 DIAGNOSIS — Z79899 Other long term (current) drug therapy: Secondary | ICD-10-CM | POA: Diagnosis not present

## 2012-05-13 DIAGNOSIS — D649 Anemia, unspecified: Secondary | ICD-10-CM | POA: Diagnosis not present

## 2012-05-15 DIAGNOSIS — Z7982 Long term (current) use of aspirin: Secondary | ICD-10-CM | POA: Diagnosis not present

## 2012-05-15 DIAGNOSIS — R0602 Shortness of breath: Secondary | ICD-10-CM | POA: Diagnosis not present

## 2012-05-15 DIAGNOSIS — B9789 Other viral agents as the cause of diseases classified elsewhere: Secondary | ICD-10-CM | POA: Diagnosis not present

## 2012-05-15 DIAGNOSIS — I369 Nonrheumatic tricuspid valve disorder, unspecified: Secondary | ICD-10-CM | POA: Diagnosis not present

## 2012-05-15 DIAGNOSIS — Z794 Long term (current) use of insulin: Secondary | ICD-10-CM | POA: Diagnosis not present

## 2012-05-15 DIAGNOSIS — Z792 Long term (current) use of antibiotics: Secondary | ICD-10-CM | POA: Diagnosis not present

## 2012-05-15 DIAGNOSIS — I10 Essential (primary) hypertension: Secondary | ICD-10-CM | POA: Diagnosis not present

## 2012-05-15 DIAGNOSIS — Z954 Presence of other heart-valve replacement: Secondary | ICD-10-CM | POA: Diagnosis not present

## 2012-05-15 DIAGNOSIS — E119 Type 2 diabetes mellitus without complications: Secondary | ICD-10-CM | POA: Diagnosis not present

## 2012-05-15 DIAGNOSIS — Z79899 Other long term (current) drug therapy: Secondary | ICD-10-CM | POA: Diagnosis not present

## 2012-05-15 DIAGNOSIS — Z940 Kidney transplant status: Secondary | ICD-10-CM | POA: Diagnosis not present

## 2012-05-20 DIAGNOSIS — I120 Hypertensive chronic kidney disease with stage 5 chronic kidney disease or end stage renal disease: Secondary | ICD-10-CM | POA: Diagnosis not present

## 2012-05-20 DIAGNOSIS — B349 Viral infection, unspecified: Secondary | ICD-10-CM | POA: Diagnosis not present

## 2012-05-20 DIAGNOSIS — D72819 Decreased white blood cell count, unspecified: Secondary | ICD-10-CM | POA: Diagnosis not present

## 2012-05-20 DIAGNOSIS — E1129 Type 2 diabetes mellitus with other diabetic kidney complication: Secondary | ICD-10-CM | POA: Diagnosis not present

## 2012-05-20 DIAGNOSIS — Z940 Kidney transplant status: Secondary | ICD-10-CM | POA: Diagnosis not present

## 2012-05-20 DIAGNOSIS — Z48298 Encounter for aftercare following other organ transplant: Secondary | ICD-10-CM | POA: Diagnosis not present

## 2012-05-20 DIAGNOSIS — R5381 Other malaise: Secondary | ICD-10-CM | POA: Diagnosis not present

## 2012-05-20 DIAGNOSIS — E119 Type 2 diabetes mellitus without complications: Secondary | ICD-10-CM | POA: Diagnosis not present

## 2012-05-20 DIAGNOSIS — I2510 Atherosclerotic heart disease of native coronary artery without angina pectoris: Secondary | ICD-10-CM | POA: Diagnosis not present

## 2012-05-20 DIAGNOSIS — I10 Essential (primary) hypertension: Secondary | ICD-10-CM | POA: Diagnosis not present

## 2012-05-20 DIAGNOSIS — D899 Disorder involving the immune mechanism, unspecified: Secondary | ICD-10-CM | POA: Diagnosis not present

## 2012-05-20 DIAGNOSIS — Z79899 Other long term (current) drug therapy: Secondary | ICD-10-CM | POA: Diagnosis not present

## 2012-05-20 DIAGNOSIS — N186 End stage renal disease: Secondary | ICD-10-CM | POA: Diagnosis not present

## 2012-05-20 DIAGNOSIS — D649 Anemia, unspecified: Secondary | ICD-10-CM | POA: Diagnosis not present

## 2012-05-26 DIAGNOSIS — B349 Viral infection, unspecified: Secondary | ICD-10-CM | POA: Diagnosis not present

## 2012-05-26 DIAGNOSIS — I4891 Unspecified atrial fibrillation: Secondary | ICD-10-CM | POA: Diagnosis not present

## 2012-05-26 DIAGNOSIS — I10 Essential (primary) hypertension: Secondary | ICD-10-CM | POA: Diagnosis not present

## 2012-05-26 DIAGNOSIS — Z79899 Other long term (current) drug therapy: Secondary | ICD-10-CM | POA: Diagnosis not present

## 2012-05-26 DIAGNOSIS — E119 Type 2 diabetes mellitus without complications: Secondary | ICD-10-CM | POA: Diagnosis not present

## 2012-05-26 DIAGNOSIS — Z48298 Encounter for aftercare following other organ transplant: Secondary | ICD-10-CM | POA: Diagnosis not present

## 2012-05-26 DIAGNOSIS — Z940 Kidney transplant status: Secondary | ICD-10-CM | POA: Diagnosis not present

## 2012-05-26 DIAGNOSIS — D649 Anemia, unspecified: Secondary | ICD-10-CM | POA: Diagnosis not present

## 2012-06-02 DIAGNOSIS — I120 Hypertensive chronic kidney disease with stage 5 chronic kidney disease or end stage renal disease: Secondary | ICD-10-CM | POA: Diagnosis not present

## 2012-06-02 DIAGNOSIS — Z79899 Other long term (current) drug therapy: Secondary | ICD-10-CM | POA: Diagnosis not present

## 2012-06-02 DIAGNOSIS — N186 End stage renal disease: Secondary | ICD-10-CM | POA: Diagnosis not present

## 2012-06-02 DIAGNOSIS — Z48298 Encounter for aftercare following other organ transplant: Secondary | ICD-10-CM | POA: Diagnosis not present

## 2012-06-02 DIAGNOSIS — Z940 Kidney transplant status: Secondary | ICD-10-CM | POA: Diagnosis not present

## 2012-06-02 DIAGNOSIS — B349 Viral infection, unspecified: Secondary | ICD-10-CM | POA: Diagnosis not present

## 2012-06-02 DIAGNOSIS — E119 Type 2 diabetes mellitus without complications: Secondary | ICD-10-CM | POA: Diagnosis not present

## 2012-06-02 DIAGNOSIS — I10 Essential (primary) hypertension: Secondary | ICD-10-CM | POA: Diagnosis not present

## 2012-06-04 ENCOUNTER — Other Ambulatory Visit: Payer: Self-pay | Admitting: Family Medicine

## 2012-06-04 DIAGNOSIS — Z48298 Encounter for aftercare following other organ transplant: Secondary | ICD-10-CM | POA: Diagnosis not present

## 2012-06-04 DIAGNOSIS — R7989 Other specified abnormal findings of blood chemistry: Secondary | ICD-10-CM | POA: Diagnosis not present

## 2012-06-04 DIAGNOSIS — D649 Anemia, unspecified: Secondary | ICD-10-CM | POA: Diagnosis not present

## 2012-06-04 DIAGNOSIS — Z538 Procedure and treatment not carried out for other reasons: Secondary | ICD-10-CM | POA: Diagnosis not present

## 2012-06-04 DIAGNOSIS — D899 Disorder involving the immune mechanism, unspecified: Secondary | ICD-10-CM | POA: Diagnosis not present

## 2012-06-04 DIAGNOSIS — I10 Essential (primary) hypertension: Secondary | ICD-10-CM | POA: Diagnosis not present

## 2012-06-04 DIAGNOSIS — B349 Viral infection, unspecified: Secondary | ICD-10-CM | POA: Diagnosis not present

## 2012-06-04 DIAGNOSIS — Z79899 Other long term (current) drug therapy: Secondary | ICD-10-CM | POA: Diagnosis not present

## 2012-06-04 DIAGNOSIS — I2510 Atherosclerotic heart disease of native coronary artery without angina pectoris: Secondary | ICD-10-CM | POA: Diagnosis not present

## 2012-06-04 DIAGNOSIS — E118 Type 2 diabetes mellitus with unspecified complications: Secondary | ICD-10-CM | POA: Diagnosis not present

## 2012-06-04 DIAGNOSIS — Z951 Presence of aortocoronary bypass graft: Secondary | ICD-10-CM | POA: Diagnosis not present

## 2012-06-04 DIAGNOSIS — Z940 Kidney transplant status: Secondary | ICD-10-CM | POA: Diagnosis not present

## 2012-06-04 DIAGNOSIS — I4891 Unspecified atrial fibrillation: Secondary | ICD-10-CM | POA: Diagnosis not present

## 2012-06-04 DIAGNOSIS — E119 Type 2 diabetes mellitus without complications: Secondary | ICD-10-CM | POA: Diagnosis not present

## 2012-06-09 DIAGNOSIS — Z48298 Encounter for aftercare following other organ transplant: Secondary | ICD-10-CM | POA: Diagnosis not present

## 2012-06-09 DIAGNOSIS — B349 Viral infection, unspecified: Secondary | ICD-10-CM | POA: Diagnosis not present

## 2012-06-09 DIAGNOSIS — Z940 Kidney transplant status: Secondary | ICD-10-CM | POA: Diagnosis not present

## 2012-06-09 DIAGNOSIS — E119 Type 2 diabetes mellitus without complications: Secondary | ICD-10-CM | POA: Diagnosis not present

## 2012-06-09 DIAGNOSIS — I10 Essential (primary) hypertension: Secondary | ICD-10-CM | POA: Diagnosis not present

## 2012-06-09 DIAGNOSIS — Z79899 Other long term (current) drug therapy: Secondary | ICD-10-CM | POA: Diagnosis not present

## 2012-06-16 DIAGNOSIS — R609 Edema, unspecified: Secondary | ICD-10-CM | POA: Diagnosis not present

## 2012-06-16 DIAGNOSIS — E1129 Type 2 diabetes mellitus with other diabetic kidney complication: Secondary | ICD-10-CM | POA: Diagnosis not present

## 2012-06-16 DIAGNOSIS — Z940 Kidney transplant status: Secondary | ICD-10-CM | POA: Diagnosis not present

## 2012-06-16 DIAGNOSIS — Z79899 Other long term (current) drug therapy: Secondary | ICD-10-CM | POA: Diagnosis not present

## 2012-06-16 DIAGNOSIS — Z48298 Encounter for aftercare following other organ transplant: Secondary | ICD-10-CM | POA: Diagnosis not present

## 2012-06-16 DIAGNOSIS — B349 Viral infection, unspecified: Secondary | ICD-10-CM | POA: Diagnosis not present

## 2012-06-16 DIAGNOSIS — N058 Unspecified nephritic syndrome with other morphologic changes: Secondary | ICD-10-CM | POA: Diagnosis not present

## 2012-06-16 DIAGNOSIS — E119 Type 2 diabetes mellitus without complications: Secondary | ICD-10-CM | POA: Diagnosis not present

## 2012-06-23 ENCOUNTER — Ambulatory Visit: Payer: Medicare Other | Admitting: Cardiology

## 2012-06-23 DIAGNOSIS — Z940 Kidney transplant status: Secondary | ICD-10-CM | POA: Diagnosis not present

## 2012-06-23 DIAGNOSIS — E119 Type 2 diabetes mellitus without complications: Secondary | ICD-10-CM | POA: Diagnosis not present

## 2012-06-23 DIAGNOSIS — Z79899 Other long term (current) drug therapy: Secondary | ICD-10-CM | POA: Diagnosis not present

## 2012-06-23 DIAGNOSIS — Z48298 Encounter for aftercare following other organ transplant: Secondary | ICD-10-CM | POA: Diagnosis not present

## 2012-06-23 DIAGNOSIS — I4891 Unspecified atrial fibrillation: Secondary | ICD-10-CM | POA: Diagnosis not present

## 2012-06-23 DIAGNOSIS — M109 Gout, unspecified: Secondary | ICD-10-CM | POA: Diagnosis not present

## 2012-06-25 ENCOUNTER — Other Ambulatory Visit: Payer: Medicare Other

## 2012-06-25 ENCOUNTER — Ambulatory Visit: Payer: Medicare Other | Admitting: Oncology

## 2012-06-26 ENCOUNTER — Other Ambulatory Visit: Payer: Self-pay | Admitting: Oncology

## 2012-06-26 NOTE — Progress Notes (Signed)
No show for visit on 9/5. POF to scheduler to reschedule.

## 2012-06-30 DIAGNOSIS — Z951 Presence of aortocoronary bypass graft: Secondary | ICD-10-CM | POA: Diagnosis not present

## 2012-06-30 DIAGNOSIS — E1129 Type 2 diabetes mellitus with other diabetic kidney complication: Secondary | ICD-10-CM | POA: Diagnosis not present

## 2012-06-30 DIAGNOSIS — Z79899 Other long term (current) drug therapy: Secondary | ICD-10-CM | POA: Diagnosis not present

## 2012-06-30 DIAGNOSIS — D649 Anemia, unspecified: Secondary | ICD-10-CM | POA: Diagnosis not present

## 2012-06-30 DIAGNOSIS — Z48298 Encounter for aftercare following other organ transplant: Secondary | ICD-10-CM | POA: Diagnosis not present

## 2012-06-30 DIAGNOSIS — M109 Gout, unspecified: Secondary | ICD-10-CM | POA: Diagnosis not present

## 2012-06-30 DIAGNOSIS — E118 Type 2 diabetes mellitus with unspecified complications: Secondary | ICD-10-CM | POA: Diagnosis not present

## 2012-06-30 DIAGNOSIS — I4891 Unspecified atrial fibrillation: Secondary | ICD-10-CM | POA: Diagnosis not present

## 2012-06-30 DIAGNOSIS — I10 Essential (primary) hypertension: Secondary | ICD-10-CM | POA: Diagnosis not present

## 2012-06-30 DIAGNOSIS — R609 Edema, unspecified: Secondary | ICD-10-CM | POA: Diagnosis not present

## 2012-06-30 DIAGNOSIS — N058 Unspecified nephritic syndrome with other morphologic changes: Secondary | ICD-10-CM | POA: Diagnosis not present

## 2012-06-30 DIAGNOSIS — Z940 Kidney transplant status: Secondary | ICD-10-CM | POA: Diagnosis not present

## 2012-07-02 ENCOUNTER — Telehealth: Payer: Self-pay | Admitting: Oncology

## 2012-07-02 NOTE — Telephone Encounter (Signed)
Per 9/6 pof r/s 9/5 appt. Per pt he wishes to cx right now due to he is being monitored by [facility] so this appt is not needed. Message to [facility].

## 2012-07-06 DIAGNOSIS — Z48298 Encounter for aftercare following other organ transplant: Secondary | ICD-10-CM | POA: Diagnosis not present

## 2012-07-06 DIAGNOSIS — Z79899 Other long term (current) drug therapy: Secondary | ICD-10-CM | POA: Diagnosis not present

## 2012-07-06 DIAGNOSIS — Z940 Kidney transplant status: Secondary | ICD-10-CM | POA: Diagnosis not present

## 2012-07-11 ENCOUNTER — Other Ambulatory Visit: Payer: Self-pay | Admitting: Family Medicine

## 2012-07-13 DIAGNOSIS — Z951 Presence of aortocoronary bypass graft: Secondary | ICD-10-CM | POA: Diagnosis not present

## 2012-07-13 DIAGNOSIS — B349 Viral infection, unspecified: Secondary | ICD-10-CM | POA: Diagnosis not present

## 2012-07-13 DIAGNOSIS — Z48298 Encounter for aftercare following other organ transplant: Secondary | ICD-10-CM | POA: Diagnosis not present

## 2012-07-13 DIAGNOSIS — R609 Edema, unspecified: Secondary | ICD-10-CM | POA: Diagnosis not present

## 2012-07-13 DIAGNOSIS — N058 Unspecified nephritic syndrome with other morphologic changes: Secondary | ICD-10-CM | POA: Diagnosis not present

## 2012-07-13 DIAGNOSIS — Z79899 Other long term (current) drug therapy: Secondary | ICD-10-CM | POA: Diagnosis not present

## 2012-07-13 DIAGNOSIS — E1129 Type 2 diabetes mellitus with other diabetic kidney complication: Secondary | ICD-10-CM | POA: Diagnosis not present

## 2012-07-13 DIAGNOSIS — Z940 Kidney transplant status: Secondary | ICD-10-CM | POA: Diagnosis not present

## 2012-07-13 DIAGNOSIS — I10 Essential (primary) hypertension: Secondary | ICD-10-CM | POA: Diagnosis not present

## 2012-07-14 ENCOUNTER — Other Ambulatory Visit: Payer: Self-pay | Admitting: Family Medicine

## 2012-07-16 ENCOUNTER — Other Ambulatory Visit: Payer: Medicare Other | Admitting: Lab

## 2012-07-16 ENCOUNTER — Ambulatory Visit: Payer: Medicare Other | Admitting: Oncology

## 2012-07-21 ENCOUNTER — Other Ambulatory Visit: Payer: Self-pay | Admitting: Family Medicine

## 2012-07-21 DIAGNOSIS — Z79899 Other long term (current) drug therapy: Secondary | ICD-10-CM | POA: Diagnosis not present

## 2012-07-21 DIAGNOSIS — Z940 Kidney transplant status: Secondary | ICD-10-CM | POA: Diagnosis not present

## 2012-07-21 DIAGNOSIS — I10 Essential (primary) hypertension: Secondary | ICD-10-CM | POA: Diagnosis not present

## 2012-07-21 DIAGNOSIS — Z48298 Encounter for aftercare following other organ transplant: Secondary | ICD-10-CM | POA: Diagnosis not present

## 2012-07-21 DIAGNOSIS — E119 Type 2 diabetes mellitus without complications: Secondary | ICD-10-CM | POA: Diagnosis not present

## 2012-07-23 ENCOUNTER — Ambulatory Visit: Payer: Medicare Other | Admitting: Cardiology

## 2012-07-28 DIAGNOSIS — I10 Essential (primary) hypertension: Secondary | ICD-10-CM | POA: Diagnosis not present

## 2012-07-28 DIAGNOSIS — Z79899 Other long term (current) drug therapy: Secondary | ICD-10-CM | POA: Diagnosis not present

## 2012-07-28 DIAGNOSIS — E1129 Type 2 diabetes mellitus with other diabetic kidney complication: Secondary | ICD-10-CM | POA: Diagnosis not present

## 2012-07-28 DIAGNOSIS — N058 Unspecified nephritic syndrome with other morphologic changes: Secondary | ICD-10-CM | POA: Diagnosis not present

## 2012-07-28 DIAGNOSIS — B349 Viral infection, unspecified: Secondary | ICD-10-CM | POA: Diagnosis not present

## 2012-07-28 DIAGNOSIS — Z951 Presence of aortocoronary bypass graft: Secondary | ICD-10-CM | POA: Diagnosis not present

## 2012-07-28 DIAGNOSIS — Z940 Kidney transplant status: Secondary | ICD-10-CM | POA: Diagnosis not present

## 2012-07-28 DIAGNOSIS — R609 Edema, unspecified: Secondary | ICD-10-CM | POA: Diagnosis not present

## 2012-07-30 ENCOUNTER — Ambulatory Visit (INDEPENDENT_AMBULATORY_CARE_PROVIDER_SITE_OTHER): Payer: Medicare Other | Admitting: Family Medicine

## 2012-07-30 ENCOUNTER — Encounter: Payer: Self-pay | Admitting: Family Medicine

## 2012-07-30 VITALS — BP 102/60 | HR 84 | Temp 98.8°F | Wt 213.0 lb

## 2012-07-30 DIAGNOSIS — E119 Type 2 diabetes mellitus without complications: Secondary | ICD-10-CM

## 2012-07-30 DIAGNOSIS — I4891 Unspecified atrial fibrillation: Secondary | ICD-10-CM

## 2012-07-30 DIAGNOSIS — I509 Heart failure, unspecified: Secondary | ICD-10-CM

## 2012-07-30 DIAGNOSIS — I2510 Atherosclerotic heart disease of native coronary artery without angina pectoris: Secondary | ICD-10-CM

## 2012-07-30 DIAGNOSIS — Z940 Kidney transplant status: Secondary | ICD-10-CM

## 2012-07-30 DIAGNOSIS — Z23 Encounter for immunization: Secondary | ICD-10-CM

## 2012-07-30 DIAGNOSIS — E785 Hyperlipidemia, unspecified: Secondary | ICD-10-CM

## 2012-07-30 MED ORDER — VALGANCICLOVIR HCL 450 MG PO TABS: 450.0000 mg | ORAL_TABLET | Freq: Every day | ORAL | Status: DC

## 2012-07-30 MED ORDER — AMLODIPINE BESYLATE 5 MG PO TABS: 5.0000 mg | ORAL_TABLET | Freq: Every day | ORAL | Status: DC

## 2012-07-30 MED ORDER — INSULIN LISPRO 100 UNIT/ML ~~LOC~~ SOLN: SUBCUTANEOUS | Status: DC

## 2012-07-30 MED ORDER — MYCOPHENOLATE SODIUM 180 MG PO TBEC: 180.0000 mg | DELAYED_RELEASE_TABLET | Freq: Two times a day (BID) | ORAL | Status: DC

## 2012-07-30 MED ORDER — INSULIN GLARGINE 100 UNIT/ML ~~LOC~~ SOLN: 25.0000 [IU] | Freq: Every day | SUBCUTANEOUS | Status: DC

## 2012-07-30 MED ORDER — SULFAMETHOXAZOLE-TRIMETHOPRIM 800-160 MG PO TABS: 1.0000 | ORAL_TABLET | ORAL | Status: DC

## 2012-07-30 MED ORDER — CIPROFLOXACIN HCL 500 MG PO TABS: 500.0000 mg | ORAL_TABLET | Freq: Every day | ORAL | Status: DC

## 2012-07-30 MED ORDER — TACROLIMUS 1 MG PO CAPS: 3.0000 mg | ORAL_CAPSULE | Freq: Two times a day (BID) | ORAL | Status: DC

## 2012-07-30 NOTE — Progress Notes (Signed)
  Subjective:    Patient ID: [name] Brown, male    DOB: August 08, 1936, 76 y.o.   MRN: [id number]  HPI Here to follow up after he had his kidney transplant in April. He had been going to [facility] every week for labs, but he is doing so well they have cut this back to every 2 weeks. He brings with him a print out of his recent labs, and these all look great. His creatinine is stable around 1.9 and his GFR is 31. He feels good and is active. No SOB. He has trace edema around the ankles off and on, but he control this with prn Lasix. He had an ECHO on 05-15-12 showing an EF of 50-55%. His A1c is 5.3. Appetite is good. He sleeps well.    Review of Systems  Constitutional: Negative.   Respiratory: Negative.   Cardiovascular: Negative.   Gastrointestinal: Negative.   Genitourinary: Negative.        Objective:   Physical Exam  Constitutional: He appears well-developed and well-nourished.  Neck: No thyromegaly present.  Cardiovascular: Normal rate, regular rhythm and intact distal pulses.  Exam reveals no gallop and no friction rub.        2/6 SM over the left sternal border   Pulmonary/Chest: Effort normal and breath sounds normal. No respiratory distress. He has no wheezes. He has no rales.  Abdominal: Soft. Bowel sounds are normal. He exhibits no distension and no mass. There is no tenderness. There is no rebound and no guarding.  Musculoskeletal: He exhibits no edema.  Lymphadenopathy:    He has no cervical adenopathy.  Skin: Skin is warm and dry.       No suspicious lesions           Assessment & Plan:  Overall he is doing quite well after his transplant. They are controlling his BK viral counts with meds. His renal and cardiac status is stable. Diabetes is well controlled. We will see him back in 3 months . I spent a total of 60 minutes with him, over 50% of which was in direct patient contact.

## 2012-07-30 NOTE — Addendum Note (Signed)
Addended by: Aggie Hacker A on: 07/30/2012 12:13 PM   Modules accepted: Orders

## 2012-08-06 ENCOUNTER — Encounter: Payer: Self-pay | Admitting: Cardiology

## 2012-08-06 ENCOUNTER — Ambulatory Visit (INDEPENDENT_AMBULATORY_CARE_PROVIDER_SITE_OTHER): Payer: Medicare Other | Admitting: Cardiology

## 2012-08-06 VITALS — BP 143/68 | HR 71 | Ht 72.0 in | Wt 215.8 lb

## 2012-08-06 DIAGNOSIS — I25810 Atherosclerosis of coronary artery bypass graft(s) without angina pectoris: Secondary | ICD-10-CM

## 2012-08-06 NOTE — Patient Instructions (Addendum)
The current medical regimen is effective;  continue present plan and medications.  Follow up in 1 year with Dr Hochrein.  You will receive a letter in the mail 2 months before you are due.  Please call us when you receive this letter to schedule your follow up appointment.  

## 2012-08-06 NOTE — Progress Notes (Signed)
HPI The patient presents for follow up of his AVR and CAD.  Since I last saw him he has done well. He status post renal transplant. He is followed closely at [facility]. I did review her recent echocardiogram and labs that were done. He's had no recent symptoms. He's walking routinely. He does not describe chest pressure, neck or arm discomfort. He's not having any palpitations, presyncope or syncope. He's having no PND or orthopnea.  Allergies  Allergen Reactions  . Penicillins     REACTION: hives    Current Outpatient Prescriptions  Medication Sig Dispense Refill  . allopurinol (ZYLOPRIM) 100 MG tablet Take 1 tablet (100 mg total) by mouth daily.  30 tablet  11  . ALPRAZolam (XANAX) 0.5 MG tablet Take 1 tablet (0.5 mg total) by mouth every 6 (six) hours as needed for sleep or anxiety.  120 tablet  5  . amLODipine (NORVASC) 5 MG tablet Take 1 tablet (5 mg total) by mouth daily.  1 tablet  3  . aspirin EC 81 MG tablet Take 1 tablet (81 mg total) by mouth daily.  150 tablet  2  . B Complex Vitamins (GNP VITAMIN B COMPLEX PO) Take by mouth daily.      . B Complex-C-Folic Acid (VOL-CARE RX) 1 MG TABS       . B-D ULTRAFINE III SHORT PEN 31G X 8 MM MISC TAKE AS DIRECTED  100 each  1  . Calcium Carb-Cholecalciferol (CALCIUM 1000 + D PO) Take by mouth.      . calcium carbonate (TUMS - DOSED IN MG ELEMENTAL CALCIUM) 500 MG chewable tablet Chew 1 tablet by mouth daily.       . carvedilol (COREG) 3.125 MG tablet Take 12.5 mg by mouth 2 (two) times daily.       . ciprofloxacin (CIPRO) 500 MG tablet Take 1 tablet (500 mg total) by mouth daily.  1 tablet  0  . colchicine 0.6 MG tablet Take 0.6 mg by mouth daily as needed.      Mariane Baumgarten Sodium (COLACE PO) Take by mouth 2 (two) times daily.      . furosemide (LASIX) 40 MG tablet Take 40 mg by mouth daily. As needed      . hydrALAZINE (APRESOLINE) 25 MG tablet       . insulin glargine (LANTUS) 100 UNIT/ML injection Inject 28 Units into the skin at  bedtime. Sliding scale      . lactulose (CHRONULAC) 10 GM/15ML solution       . levothyroxine (SYNTHROID, LEVOTHROID) 25 MCG tablet Take 25 mcg by mouth daily.       . mycophenolate (MYFORTIC) 180 MG EC tablet Take 1 tablet (180 mg total) by mouth 2 (two) times daily.  2 tablet  0  . omeprazole (PRILOSEC) 20 MG capsule       . ONE TOUCH ULTRA TEST test strip USE WITH GLUCOSE MONITOR 3 TIMES A DAY  100 each  6  . tacrolimus (PROGRAF) 1 MG capsule Take 3 capsules (3 mg total) by mouth 2 (two) times daily.  6 capsule  0  . temazepam (RESTORIL) 30 MG capsule Take 30 mg by mouth at bedtime as needed.        . traMADol (ULTRAM) 50 MG tablet Take 50 mg by mouth every 6 (six) hours as needed.       . valGANciclovir (VALCYTE) 450 MG tablet Take 1 tablet (450 mg total) by mouth daily.  1 tablet  0  .  insulin lispro (HUMALOG) 100 UNIT/ML injection Sliding scale  10 mL  12  . oxyCODONE (OXY IR/ROXICODONE) 5 MG immediate release tablet Take 5 mg by mouth every 6 (six) hours as needed.       . simvastatin (ZOCOR) 20 MG tablet Take 20 mg by mouth at bedtime.       . sulfamethoxazole-trimethoprim (BACTRIM DS,SEPTRA DS) 800-160 MG per tablet Take 1 tablet by mouth every other day.  1 tablet  0  . DISCONTD: allopurinol (ZYLOPRIM) 100 MG tablet TAKE 1 TABLET BY MOUTH EVERY DAY AS DIRECTED  30 tablet  5  . DISCONTD: insulin glargine (LANTUS SOLOSTAR) 100 UNIT/ML injection Inject 25 Units into the skin at bedtime.  100 mL  11    Past Medical History  Diagnosis Date  . CAD (coronary artery disease)   . Hypertension   . Hyperlipidemia   . Myocardial infarction   . Diabetes mellitus   . Gout   . Obesity   . Chronic kidney disease     chronic renal failure  . Atrial fibrillation   . CVA (cerebral infarction) 05/2010  . Thrombocytopenia 2011    sees Dr. Lamonte Sakai  . Splenomegaly 2012  . Anemia associated with chronic renal failure   . Aortic stenosis   . Anemia 01/02/2012  . Thrombocytopenia     Past Surgical  History  Procedure Date  . Appendectomy   . Coronary artery bypass graft     June 2 011 Limited the LAD, SVG to OM, SVG to PDA  . Bone spurs     from right heel  . Aortic valve replacement     Pericardial tissue valve  . Kidney transplant 01-20-12    at [facility]:  As stated in the HPI and negative for all other systems.  PHYSICAL EXAM BP 143/68  Pulse 71  Ht 6' (1.829 m)  Wt 215 lb 12.8 oz (97.886 kg)  BMI 29.27 kg/m2 GENERAL:  Well appearing HEENT:  Pupils equal round and reactive, fundi not visualized, oral mucosa unremarkable NECK:  No jugular venous distention, waveform within normal limits, carotid upstroke brisk and symmetric, no bruits, no thyromegaly LYMPHATICS:  No cervical, inguinal adenopathy LUNGS:  Clear to auscultation bilaterally BACK:  No CVA tenderness CHEST:  Well healed sternotomy scar. HEART:  PMI not displaced or sustained,S1 and S2 within normal limits, no S3, no S4, no clicks, no rubs, brief apical systolic murmur ABD:  Flat, positive bowel sounds normal in frequency in pitch, no bruits, no rebound, no guarding, no midline pulsatile mass, no hepatomegaly, no splenomegaly, well healed renal transplant scar, mildly obese EXT:  2 plus pulses throughout, right upper dialysis fistula, no edema, no cyanosis no clubbing SKIN:  No rashes no nodules NEURO:  Cranial nerves II through XII grossly intact, motor grossly intact throughout PSYCH:  Cognitively intact, oriented to person place and time  EKG:  Sinus rhythm, right axis deviation, poor anterior R wave progression, premature atrial contractions, rate 71, QTC slightly prolonged, no acute ST-T wave changes.  No change from previous 08/06/2012   ASSESSMENT AND PLAN  S/P aortic valve replacement -  He had his echocardiogram at [facility]. This showed stable prosthetic valve function and an ejection fraction of 50-55%. No further testing is suggested. I will follow him up clinically and with  repeat echocardiograms.  CORONARY ARTERY DISEASE -  The patient has no new sypmtoms. He has had no change since his CABG. He will  continue with risk reduction.  HYPERTENSION - His blood pressure is difficult to control with his antirejection medications and fluctuates somewhat. At this point I will make no change to his therapy.  Dyslipidemia - I reviewed labs from [facility] was an LDL of 77 HDL of 55. This is excellent. No change in therapy is indicated.

## 2012-08-11 DIAGNOSIS — Z79899 Other long term (current) drug therapy: Secondary | ICD-10-CM | POA: Diagnosis not present

## 2012-08-11 DIAGNOSIS — B349 Viral infection, unspecified: Secondary | ICD-10-CM | POA: Diagnosis not present

## 2012-08-11 DIAGNOSIS — Z940 Kidney transplant status: Secondary | ICD-10-CM | POA: Diagnosis not present

## 2012-08-11 DIAGNOSIS — I10 Essential (primary) hypertension: Secondary | ICD-10-CM | POA: Diagnosis not present

## 2012-08-11 DIAGNOSIS — Z48298 Encounter for aftercare following other organ transplant: Secondary | ICD-10-CM | POA: Diagnosis not present

## 2012-08-11 DIAGNOSIS — E118 Type 2 diabetes mellitus with unspecified complications: Secondary | ICD-10-CM | POA: Diagnosis not present

## 2012-09-08 DIAGNOSIS — Z79899 Other long term (current) drug therapy: Secondary | ICD-10-CM | POA: Diagnosis not present

## 2012-09-08 DIAGNOSIS — Z940 Kidney transplant status: Secondary | ICD-10-CM | POA: Diagnosis not present

## 2012-09-08 DIAGNOSIS — Z48298 Encounter for aftercare following other organ transplant: Secondary | ICD-10-CM | POA: Diagnosis not present

## 2012-09-23 ENCOUNTER — Encounter: Payer: Self-pay | Admitting: Family Medicine

## 2012-09-23 ENCOUNTER — Ambulatory Visit (INDEPENDENT_AMBULATORY_CARE_PROVIDER_SITE_OTHER): Payer: Medicare Other | Admitting: Family Medicine

## 2012-09-23 VITALS — BP 108/62 | HR 73 | Temp 98.6°F | Wt 225.0 lb

## 2012-09-23 DIAGNOSIS — Z23 Encounter for immunization: Secondary | ICD-10-CM

## 2012-09-23 DIAGNOSIS — Z0000 Encounter for general adult medical examination without abnormal findings: Secondary | ICD-10-CM

## 2012-09-23 DIAGNOSIS — H6120 Impacted cerumen, unspecified ear: Secondary | ICD-10-CM

## 2012-09-23 DIAGNOSIS — M129 Arthropathy, unspecified: Secondary | ICD-10-CM

## 2012-09-23 DIAGNOSIS — I2510 Atherosclerotic heart disease of native coronary artery without angina pectoris: Secondary | ICD-10-CM

## 2012-09-23 DIAGNOSIS — E119 Type 2 diabetes mellitus without complications: Secondary | ICD-10-CM

## 2012-09-23 DIAGNOSIS — I509 Heart failure, unspecified: Secondary | ICD-10-CM

## 2012-09-23 NOTE — Addendum Note (Signed)
Addended by: Aggie Hacker A on: 09/23/2012 10:48 AM   Modules accepted: Orders

## 2012-09-23 NOTE — Progress Notes (Signed)
  Subjective:    Patient ID: [name] Brown, male    DOB: 1936/01/13, 76 y.o.   MRN: [id number]  HPI Here for several reasons. He has trouble hearing and he thinks his ears need to be cleaned out. No pain. He needs a TDaP because he has grandchildren he wants to visit. In general he has done well. His renal state is stable, and he had a good checkup with Dr. Percival Spanish in October.    Review of Systems  HENT: Positive for hearing loss. Negative for ear pain and ear discharge.   Respiratory: Negative.   Cardiovascular: Negative.        Objective:   Physical Exam  Constitutional: He appears well-developed and well-nourished.  HENT:       Both ear canals are full of cerumen   Neck: No thyromegaly present.  Cardiovascular: Normal rate, regular rhythm, normal heart sounds and intact distal pulses.   Pulmonary/Chest: Effort normal and breath sounds normal.  Lymphadenopathy:    He has no cervical adenopathy.          Assessment & Plan:  His ears were irrigated clear with water. Given a TDaP.

## 2012-10-06 DIAGNOSIS — E119 Type 2 diabetes mellitus without complications: Secondary | ICD-10-CM | POA: Diagnosis not present

## 2012-10-06 DIAGNOSIS — D696 Thrombocytopenia, unspecified: Secondary | ICD-10-CM | POA: Diagnosis not present

## 2012-10-06 DIAGNOSIS — Z792 Long term (current) use of antibiotics: Secondary | ICD-10-CM | POA: Diagnosis not present

## 2012-10-06 DIAGNOSIS — M109 Gout, unspecified: Secondary | ICD-10-CM | POA: Diagnosis not present

## 2012-10-06 DIAGNOSIS — Z8679 Personal history of other diseases of the circulatory system: Secondary | ICD-10-CM | POA: Diagnosis not present

## 2012-10-06 DIAGNOSIS — Z79899 Other long term (current) drug therapy: Secondary | ICD-10-CM | POA: Diagnosis not present

## 2012-10-06 DIAGNOSIS — T8610 Unspecified complication of kidney transplant: Secondary | ICD-10-CM | POA: Diagnosis not present

## 2012-10-06 DIAGNOSIS — R609 Edema, unspecified: Secondary | ICD-10-CM | POA: Diagnosis not present

## 2012-10-06 DIAGNOSIS — Z880 Allergy status to penicillin: Secondary | ICD-10-CM | POA: Diagnosis not present

## 2012-10-06 DIAGNOSIS — I2510 Atherosclerotic heart disease of native coronary artery without angina pectoris: Secondary | ICD-10-CM | POA: Diagnosis not present

## 2012-10-06 DIAGNOSIS — K5900 Constipation, unspecified: Secondary | ICD-10-CM | POA: Diagnosis not present

## 2012-10-06 DIAGNOSIS — B259 Cytomegaloviral disease, unspecified: Secondary | ICD-10-CM | POA: Diagnosis not present

## 2012-10-06 DIAGNOSIS — E118 Type 2 diabetes mellitus with unspecified complications: Secondary | ICD-10-CM | POA: Diagnosis not present

## 2012-10-06 DIAGNOSIS — I10 Essential (primary) hypertension: Secondary | ICD-10-CM | POA: Diagnosis not present

## 2012-10-06 DIAGNOSIS — Z794 Long term (current) use of insulin: Secondary | ICD-10-CM | POA: Diagnosis not present

## 2012-10-06 DIAGNOSIS — Z954 Presence of other heart-valve replacement: Secondary | ICD-10-CM | POA: Diagnosis not present

## 2012-10-06 DIAGNOSIS — Z8673 Personal history of transient ischemic attack (TIA), and cerebral infarction without residual deficits: Secondary | ICD-10-CM | POA: Diagnosis not present

## 2012-10-06 DIAGNOSIS — G4700 Insomnia, unspecified: Secondary | ICD-10-CM | POA: Diagnosis not present

## 2012-10-06 DIAGNOSIS — Z940 Kidney transplant status: Secondary | ICD-10-CM | POA: Diagnosis not present

## 2012-10-06 DIAGNOSIS — Z951 Presence of aortocoronary bypass graft: Secondary | ICD-10-CM | POA: Diagnosis not present

## 2012-10-06 DIAGNOSIS — Z7982 Long term (current) use of aspirin: Secondary | ICD-10-CM | POA: Diagnosis not present

## 2012-10-06 DIAGNOSIS — E039 Hypothyroidism, unspecified: Secondary | ICD-10-CM | POA: Diagnosis not present

## 2012-10-20 DIAGNOSIS — Z48298 Encounter for aftercare following other organ transplant: Secondary | ICD-10-CM | POA: Diagnosis not present

## 2012-10-20 DIAGNOSIS — Z792 Long term (current) use of antibiotics: Secondary | ICD-10-CM | POA: Diagnosis not present

## 2012-10-20 DIAGNOSIS — B349 Viral infection, unspecified: Secondary | ICD-10-CM | POA: Diagnosis not present

## 2012-10-20 DIAGNOSIS — R609 Edema, unspecified: Secondary | ICD-10-CM | POA: Diagnosis not present

## 2012-10-20 DIAGNOSIS — E1329 Other specified diabetes mellitus with other diabetic kidney complication: Secondary | ICD-10-CM | POA: Diagnosis not present

## 2012-10-20 DIAGNOSIS — D649 Anemia, unspecified: Secondary | ICD-10-CM | POA: Diagnosis not present

## 2012-10-20 DIAGNOSIS — E119 Type 2 diabetes mellitus without complications: Secondary | ICD-10-CM | POA: Diagnosis not present

## 2012-10-20 DIAGNOSIS — I10 Essential (primary) hypertension: Secondary | ICD-10-CM | POA: Diagnosis not present

## 2012-10-20 DIAGNOSIS — Z940 Kidney transplant status: Secondary | ICD-10-CM | POA: Diagnosis not present

## 2012-10-20 DIAGNOSIS — Z79899 Other long term (current) drug therapy: Secondary | ICD-10-CM | POA: Diagnosis not present

## 2012-10-20 DIAGNOSIS — Z8679 Personal history of other diseases of the circulatory system: Secondary | ICD-10-CM | POA: Diagnosis not present

## 2012-10-20 DIAGNOSIS — Z794 Long term (current) use of insulin: Secondary | ICD-10-CM | POA: Diagnosis not present

## 2012-10-20 DIAGNOSIS — N186 End stage renal disease: Secondary | ICD-10-CM | POA: Diagnosis not present

## 2012-10-20 DIAGNOSIS — I120 Hypertensive chronic kidney disease with stage 5 chronic kidney disease or end stage renal disease: Secondary | ICD-10-CM | POA: Diagnosis not present

## 2012-10-20 DIAGNOSIS — Z8673 Personal history of transient ischemic attack (TIA), and cerebral infarction without residual deficits: Secondary | ICD-10-CM | POA: Diagnosis not present

## 2012-11-19 DIAGNOSIS — M109 Gout, unspecified: Secondary | ICD-10-CM | POA: Diagnosis not present

## 2012-11-19 DIAGNOSIS — D696 Thrombocytopenia, unspecified: Secondary | ICD-10-CM | POA: Diagnosis not present

## 2012-11-19 DIAGNOSIS — E559 Vitamin D deficiency, unspecified: Secondary | ICD-10-CM | POA: Diagnosis not present

## 2012-11-19 DIAGNOSIS — Z940 Kidney transplant status: Secondary | ICD-10-CM | POA: Diagnosis not present

## 2012-11-19 DIAGNOSIS — I10 Essential (primary) hypertension: Secondary | ICD-10-CM | POA: Diagnosis not present

## 2012-12-01 ENCOUNTER — Other Ambulatory Visit: Payer: Self-pay | Admitting: Family Medicine

## 2012-12-04 ENCOUNTER — Other Ambulatory Visit: Payer: Self-pay | Admitting: Family Medicine

## 2012-12-07 NOTE — Telephone Encounter (Signed)
Call in #[phone] with 5 rf 

## 2012-12-18 DIAGNOSIS — I10 Essential (primary) hypertension: Secondary | ICD-10-CM | POA: Diagnosis not present

## 2012-12-18 DIAGNOSIS — Z940 Kidney transplant status: Secondary | ICD-10-CM | POA: Diagnosis not present

## 2013-01-14 DIAGNOSIS — Z940 Kidney transplant status: Secondary | ICD-10-CM | POA: Diagnosis not present

## 2013-01-14 DIAGNOSIS — Z79899 Other long term (current) drug therapy: Secondary | ICD-10-CM | POA: Diagnosis not present

## 2013-01-20 DIAGNOSIS — I10 Essential (primary) hypertension: Secondary | ICD-10-CM | POA: Diagnosis not present

## 2013-01-20 DIAGNOSIS — Z940 Kidney transplant status: Secondary | ICD-10-CM | POA: Diagnosis not present

## 2013-01-26 DIAGNOSIS — Z940 Kidney transplant status: Secondary | ICD-10-CM | POA: Diagnosis not present

## 2013-01-27 DIAGNOSIS — D485 Neoplasm of uncertain behavior of skin: Secondary | ICD-10-CM | POA: Diagnosis not present

## 2013-01-27 DIAGNOSIS — L570 Actinic keratosis: Secondary | ICD-10-CM | POA: Diagnosis not present

## 2013-01-27 DIAGNOSIS — D235 Other benign neoplasm of skin of trunk: Secondary | ICD-10-CM | POA: Diagnosis not present

## 2013-02-02 DIAGNOSIS — E1149 Type 2 diabetes mellitus with other diabetic neurological complication: Secondary | ICD-10-CM | POA: Diagnosis not present

## 2013-02-09 DIAGNOSIS — N189 Chronic kidney disease, unspecified: Secondary | ICD-10-CM | POA: Diagnosis not present

## 2013-02-09 DIAGNOSIS — N185 Chronic kidney disease, stage 5: Secondary | ICD-10-CM | POA: Diagnosis not present

## 2013-02-13 ENCOUNTER — Other Ambulatory Visit: Payer: Self-pay | Admitting: Family Medicine

## 2013-02-16 DIAGNOSIS — Z792 Long term (current) use of antibiotics: Secondary | ICD-10-CM | POA: Diagnosis not present

## 2013-02-16 DIAGNOSIS — E1142 Type 2 diabetes mellitus with diabetic polyneuropathy: Secondary | ICD-10-CM | POA: Diagnosis not present

## 2013-02-16 DIAGNOSIS — Z940 Kidney transplant status: Secondary | ICD-10-CM | POA: Diagnosis not present

## 2013-02-16 DIAGNOSIS — Z794 Long term (current) use of insulin: Secondary | ICD-10-CM | POA: Diagnosis not present

## 2013-02-16 DIAGNOSIS — I4891 Unspecified atrial fibrillation: Secondary | ICD-10-CM | POA: Diagnosis not present

## 2013-02-16 DIAGNOSIS — E118 Type 2 diabetes mellitus with unspecified complications: Secondary | ICD-10-CM | POA: Diagnosis not present

## 2013-02-16 DIAGNOSIS — E119 Type 2 diabetes mellitus without complications: Secondary | ICD-10-CM | POA: Diagnosis not present

## 2013-02-16 DIAGNOSIS — Z8673 Personal history of transient ischemic attack (TIA), and cerebral infarction without residual deficits: Secondary | ICD-10-CM | POA: Diagnosis not present

## 2013-02-16 DIAGNOSIS — D696 Thrombocytopenia, unspecified: Secondary | ICD-10-CM | POA: Diagnosis not present

## 2013-02-16 DIAGNOSIS — B349 Viral infection, unspecified: Secondary | ICD-10-CM | POA: Diagnosis not present

## 2013-02-16 DIAGNOSIS — Z79899 Other long term (current) drug therapy: Secondary | ICD-10-CM | POA: Diagnosis not present

## 2013-02-16 DIAGNOSIS — Z952 Presence of prosthetic heart valve: Secondary | ICD-10-CM | POA: Diagnosis not present

## 2013-02-16 DIAGNOSIS — D899 Disorder involving the immune mechanism, unspecified: Secondary | ICD-10-CM | POA: Diagnosis not present

## 2013-02-16 DIAGNOSIS — Z48298 Encounter for aftercare following other organ transplant: Secondary | ICD-10-CM | POA: Diagnosis not present

## 2013-02-16 DIAGNOSIS — E1149 Type 2 diabetes mellitus with other diabetic neurological complication: Secondary | ICD-10-CM | POA: Diagnosis not present

## 2013-02-16 DIAGNOSIS — Z7982 Long term (current) use of aspirin: Secondary | ICD-10-CM | POA: Diagnosis not present

## 2013-02-16 DIAGNOSIS — I10 Essential (primary) hypertension: Secondary | ICD-10-CM | POA: Diagnosis not present

## 2013-02-22 DIAGNOSIS — Z940 Kidney transplant status: Secondary | ICD-10-CM | POA: Diagnosis not present

## 2013-02-22 DIAGNOSIS — Z79899 Other long term (current) drug therapy: Secondary | ICD-10-CM | POA: Diagnosis not present

## 2013-02-24 DIAGNOSIS — Z940 Kidney transplant status: Secondary | ICD-10-CM | POA: Diagnosis not present

## 2013-02-24 DIAGNOSIS — D693 Immune thrombocytopenic purpura: Secondary | ICD-10-CM | POA: Diagnosis not present

## 2013-02-24 DIAGNOSIS — E559 Vitamin D deficiency, unspecified: Secondary | ICD-10-CM | POA: Diagnosis not present

## 2013-03-24 DIAGNOSIS — Z940 Kidney transplant status: Secondary | ICD-10-CM | POA: Diagnosis not present

## 2013-03-24 DIAGNOSIS — Z79899 Other long term (current) drug therapy: Secondary | ICD-10-CM | POA: Diagnosis not present

## 2013-03-29 DIAGNOSIS — D696 Thrombocytopenia, unspecified: Secondary | ICD-10-CM | POA: Diagnosis not present

## 2013-03-29 DIAGNOSIS — I10 Essential (primary) hypertension: Secondary | ICD-10-CM | POA: Diagnosis not present

## 2013-03-29 DIAGNOSIS — Z940 Kidney transplant status: Secondary | ICD-10-CM | POA: Diagnosis not present

## 2013-03-30 ENCOUNTER — Ambulatory Visit: Payer: Medicare Other | Admitting: Family Medicine

## 2013-03-31 ENCOUNTER — Encounter: Payer: Self-pay | Admitting: Family Medicine

## 2013-03-31 ENCOUNTER — Ambulatory Visit (INDEPENDENT_AMBULATORY_CARE_PROVIDER_SITE_OTHER): Payer: Medicare Other | Admitting: Family Medicine

## 2013-03-31 VITALS — BP 126/60 | HR 74 | Temp 98.5°F | Wt 230.0 lb

## 2013-03-31 DIAGNOSIS — M79609 Pain in unspecified limb: Secondary | ICD-10-CM | POA: Diagnosis not present

## 2013-03-31 DIAGNOSIS — G500 Trigeminal neuralgia: Secondary | ICD-10-CM | POA: Diagnosis not present

## 2013-03-31 MED ORDER — GABAPENTIN 100 MG PO CAPS: 100.0000 mg | ORAL_CAPSULE | Freq: Three times a day (TID) | ORAL | Status: DC

## 2013-03-31 NOTE — Progress Notes (Signed)
  Subjective:    Patient ID: [name] Brown, male    DOB: Nov 23, 1935, 77 y.o.   MRN: [id number]  HPI Here for several issues. First for several months he has had recurrent pains in the left temple. He gets these anywhere from once to 4 times a day. They are sharp and only last a second or two. No other neurologic problems. Also for 2 months he has had tender knots in both palms which make it hard to bend his fingers at times.    Review of Systems  Constitutional: Negative.   Musculoskeletal: Positive for arthralgias.  Neurological: Positive for headaches. Negative for dizziness, tremors, seizures, syncope, facial asymmetry, speech difficulty, weakness, light-headedness and numbness.       Objective:   Physical Exam  Constitutional: He is oriented to person, place, and time. He appears well-developed and well-nourished.  HENT:  Head: Normocephalic and atraumatic.  Not tender over the left temple  Musculoskeletal:  He has tender knots in both palms along flexor tendons   Neurological: He is alert and oriented to person, place, and time. He has normal reflexes. No cranial nerve deficit. He exhibits normal muscle tone. Coordination normal.          Assessment & Plan:  Refer to Hand Surgery. Try Gabapentin for trigeminal neuralgia.

## 2013-04-28 DIAGNOSIS — Z79899 Other long term (current) drug therapy: Secondary | ICD-10-CM | POA: Diagnosis not present

## 2013-04-28 DIAGNOSIS — Z940 Kidney transplant status: Secondary | ICD-10-CM | POA: Diagnosis not present

## 2013-05-17 ENCOUNTER — Other Ambulatory Visit: Payer: Self-pay | Admitting: Family Medicine

## 2013-06-02 DIAGNOSIS — Z940 Kidney transplant status: Secondary | ICD-10-CM | POA: Diagnosis not present

## 2013-06-02 DIAGNOSIS — Z79899 Other long term (current) drug therapy: Secondary | ICD-10-CM | POA: Diagnosis not present

## 2013-07-06 ENCOUNTER — Other Ambulatory Visit: Payer: Self-pay | Admitting: Family Medicine

## 2013-07-06 DIAGNOSIS — N2581 Secondary hyperparathyroidism of renal origin: Secondary | ICD-10-CM | POA: Diagnosis not present

## 2013-07-06 DIAGNOSIS — Z79899 Other long term (current) drug therapy: Secondary | ICD-10-CM | POA: Diagnosis not present

## 2013-07-06 DIAGNOSIS — E785 Hyperlipidemia, unspecified: Secondary | ICD-10-CM | POA: Diagnosis not present

## 2013-07-06 DIAGNOSIS — E119 Type 2 diabetes mellitus without complications: Secondary | ICD-10-CM | POA: Diagnosis not present

## 2013-07-06 DIAGNOSIS — Z940 Kidney transplant status: Secondary | ICD-10-CM | POA: Diagnosis not present

## 2013-07-13 DIAGNOSIS — Z79899 Other long term (current) drug therapy: Secondary | ICD-10-CM | POA: Diagnosis not present

## 2013-07-13 DIAGNOSIS — Z940 Kidney transplant status: Secondary | ICD-10-CM | POA: Diagnosis not present

## 2013-07-14 DIAGNOSIS — Z79899 Other long term (current) drug therapy: Secondary | ICD-10-CM | POA: Diagnosis not present

## 2013-07-14 DIAGNOSIS — Z940 Kidney transplant status: Secondary | ICD-10-CM | POA: Diagnosis not present

## 2013-07-22 DIAGNOSIS — Z940 Kidney transplant status: Secondary | ICD-10-CM | POA: Diagnosis not present

## 2013-07-22 DIAGNOSIS — Z23 Encounter for immunization: Secondary | ICD-10-CM | POA: Diagnosis not present

## 2013-07-22 DIAGNOSIS — E785 Hyperlipidemia, unspecified: Secondary | ICD-10-CM | POA: Diagnosis not present

## 2013-07-22 DIAGNOSIS — I10 Essential (primary) hypertension: Secondary | ICD-10-CM | POA: Diagnosis not present

## 2013-07-23 ENCOUNTER — Other Ambulatory Visit: Payer: Self-pay | Admitting: Family Medicine

## 2013-08-04 DIAGNOSIS — M6530 Trigger finger, unspecified finger: Secondary | ICD-10-CM | POA: Diagnosis not present

## 2013-08-05 ENCOUNTER — Other Ambulatory Visit: Payer: Self-pay | Admitting: Family Medicine

## 2013-08-08 ENCOUNTER — Other Ambulatory Visit: Payer: Self-pay | Admitting: Family Medicine

## 2013-08-16 DIAGNOSIS — D235 Other benign neoplasm of skin of trunk: Secondary | ICD-10-CM | POA: Diagnosis not present

## 2013-08-16 DIAGNOSIS — L570 Actinic keratosis: Secondary | ICD-10-CM | POA: Diagnosis not present

## 2013-08-17 DIAGNOSIS — Z79899 Other long term (current) drug therapy: Secondary | ICD-10-CM | POA: Diagnosis not present

## 2013-08-17 DIAGNOSIS — Z940 Kidney transplant status: Secondary | ICD-10-CM | POA: Diagnosis not present

## 2013-08-20 ENCOUNTER — Ambulatory Visit (INDEPENDENT_AMBULATORY_CARE_PROVIDER_SITE_OTHER): Payer: Medicare Other | Admitting: Family Medicine

## 2013-08-20 ENCOUNTER — Encounter: Payer: Self-pay | Admitting: Family Medicine

## 2013-08-20 VITALS — BP 138/70 | HR 78 | Temp 98.3°F | Wt 224.0 lb

## 2013-08-20 DIAGNOSIS — E785 Hyperlipidemia, unspecified: Secondary | ICD-10-CM | POA: Diagnosis not present

## 2013-08-20 DIAGNOSIS — E119 Type 2 diabetes mellitus without complications: Secondary | ICD-10-CM | POA: Diagnosis not present

## 2013-08-20 DIAGNOSIS — N259 Disorder resulting from impaired renal tubular function, unspecified: Secondary | ICD-10-CM

## 2013-08-20 DIAGNOSIS — I10 Essential (primary) hypertension: Secondary | ICD-10-CM

## 2013-08-20 DIAGNOSIS — Z940 Kidney transplant status: Secondary | ICD-10-CM

## 2013-08-20 MED ORDER — SIMVASTATIN 10 MG PO TABS: 10.0000 mg | ORAL_TABLET | Freq: Every day | ORAL | Status: DC

## 2013-08-20 NOTE — Progress Notes (Signed)
  Subjective:    Patient ID: [name] Brown, male    DOB: Oct 18, 1936, 76 y.o.   MRN: [id number]  HPI Here to follow up. He feels well in general. He enjoyed his stay in [place]. He saw Dr. Lorrene Reid last month and his renal function is stable with a creatinine of 1.5. His LDL was elevated at 121 so she started him on Simvastatin. His A1c was excellent at 6.5.    Review of Systems  Constitutional: Negative.   Respiratory: Negative.   Cardiovascular: Negative.        Objective:   Physical Exam  Constitutional: He appears well-developed and well-nourished.  Cardiovascular: Normal rate, regular rhythm, normal heart sounds and intact distal pulses.   Pulmonary/Chest: Effort normal and breath sounds normal.          Assessment & Plan:  He is doing well. Recheck in 3 months

## 2013-08-23 DIAGNOSIS — B349 Viral infection, unspecified: Secondary | ICD-10-CM | POA: Diagnosis not present

## 2013-08-23 DIAGNOSIS — E119 Type 2 diabetes mellitus without complications: Secondary | ICD-10-CM | POA: Diagnosis not present

## 2013-08-23 DIAGNOSIS — I10 Essential (primary) hypertension: Secondary | ICD-10-CM | POA: Diagnosis not present

## 2013-08-23 DIAGNOSIS — Z940 Kidney transplant status: Secondary | ICD-10-CM | POA: Diagnosis not present

## 2013-09-06 ENCOUNTER — Other Ambulatory Visit: Payer: Self-pay | Admitting: Family Medicine

## 2013-09-17 ENCOUNTER — Other Ambulatory Visit: Payer: Self-pay | Admitting: Family Medicine

## 2013-09-20 MED ORDER — ALPRAZOLAM 0.5 MG PO TABS: 0.5000 mg | ORAL_TABLET | Freq: Four times a day (QID) | ORAL | Status: DC | PRN

## 2013-09-20 NOTE — Telephone Encounter (Signed)
I called in script 

## 2013-09-20 NOTE — Telephone Encounter (Signed)
Call in #[phone] with 5 rf 

## 2013-09-24 ENCOUNTER — Ambulatory Visit (INDEPENDENT_AMBULATORY_CARE_PROVIDER_SITE_OTHER): Payer: Medicare Other | Admitting: Family Medicine

## 2013-09-24 ENCOUNTER — Encounter: Payer: Self-pay | Admitting: Family Medicine

## 2013-09-24 VITALS — BP 120/62 | HR 80 | Temp 98.2°F | Wt 230.0 lb

## 2013-09-24 DIAGNOSIS — H6123 Impacted cerumen, bilateral: Secondary | ICD-10-CM

## 2013-09-24 DIAGNOSIS — H6120 Impacted cerumen, unspecified ear: Secondary | ICD-10-CM

## 2013-09-24 MED ORDER — OXYCODONE HCL 5 MG PO TABS: 5.0000 mg | ORAL_TABLET | Freq: Four times a day (QID) | ORAL | Status: DC | PRN

## 2013-09-24 NOTE — Progress Notes (Signed)
   Subjective:    Patient ID: [name] Brown, male    DOB: 1936-03-16, 77 y.o.   MRN: [id number]  HPI Here for loss of hearing and pressure in both ears. No pain.    Review of Systems  Constitutional: Negative.   HENT: Positive for congestion and hearing loss. Negative for ear pain.   Eyes: Negative.   Respiratory: Negative.        Objective:   Physical Exam  Constitutional: He appears well-developed and well-nourished.  HENT:  Nose: Nose normal.  Mouth/Throat: Oropharynx is clear and moist.  Both ear canals are full of cerumen  Eyes: Conjunctivae are normal.  Pulmonary/Chest: Breath sounds normal.  Lymphadenopathy:    He has no cervical adenopathy.          Assessment & Plan:  Both ears were irrigated clear with water

## 2013-10-12 DIAGNOSIS — E1149 Type 2 diabetes mellitus with other diabetic neurological complication: Secondary | ICD-10-CM | POA: Diagnosis not present

## 2013-10-12 DIAGNOSIS — L600 Ingrowing nail: Secondary | ICD-10-CM | POA: Diagnosis not present

## 2013-10-12 DIAGNOSIS — L259 Unspecified contact dermatitis, unspecified cause: Secondary | ICD-10-CM | POA: Diagnosis not present

## 2013-10-18 DIAGNOSIS — Z940 Kidney transplant status: Secondary | ICD-10-CM | POA: Diagnosis not present

## 2013-10-18 DIAGNOSIS — I10 Essential (primary) hypertension: Secondary | ICD-10-CM | POA: Diagnosis not present

## 2013-10-18 DIAGNOSIS — B349 Viral infection, unspecified: Secondary | ICD-10-CM | POA: Diagnosis not present

## 2013-10-18 DIAGNOSIS — E119 Type 2 diabetes mellitus without complications: Secondary | ICD-10-CM | POA: Diagnosis not present

## 2013-10-18 DIAGNOSIS — E785 Hyperlipidemia, unspecified: Secondary | ICD-10-CM | POA: Diagnosis not present

## 2013-10-19 DIAGNOSIS — L02619 Cutaneous abscess of unspecified foot: Secondary | ICD-10-CM | POA: Diagnosis not present

## 2013-10-20 ENCOUNTER — Encounter: Payer: Self-pay | Admitting: *Deleted

## 2013-10-29 ENCOUNTER — Encounter: Payer: Self-pay | Admitting: Cardiology

## 2013-10-29 ENCOUNTER — Ambulatory Visit (INDEPENDENT_AMBULATORY_CARE_PROVIDER_SITE_OTHER): Payer: Medicare Other | Admitting: Cardiology

## 2013-10-29 VITALS — BP 148/80 | HR 71 | Ht 72.0 in | Wt 234.0 lb

## 2013-10-29 DIAGNOSIS — I6529 Occlusion and stenosis of unspecified carotid artery: Secondary | ICD-10-CM | POA: Diagnosis not present

## 2013-10-29 DIAGNOSIS — I509 Heart failure, unspecified: Secondary | ICD-10-CM

## 2013-10-29 DIAGNOSIS — I658 Occlusion and stenosis of other precerebral arteries: Secondary | ICD-10-CM

## 2013-10-29 DIAGNOSIS — I2510 Atherosclerotic heart disease of native coronary artery without angina pectoris: Secondary | ICD-10-CM | POA: Diagnosis not present

## 2013-10-29 DIAGNOSIS — I359 Nonrheumatic aortic valve disorder, unspecified: Secondary | ICD-10-CM | POA: Diagnosis not present

## 2013-10-29 DIAGNOSIS — I6523 Occlusion and stenosis of bilateral carotid arteries: Secondary | ICD-10-CM

## 2013-10-29 NOTE — Patient Instructions (Signed)

## 2013-10-29 NOTE — Progress Notes (Signed)
HPI The patient presents for follow up of his AVR and CAD.  Since I last saw him he has done well. He status post renal transplant. He is followed closely at [facility]. He is not walking because he had surgery on an ingrown toenail.  However, he can start this today. He does not describe chest pressure, neck or arm discomfort. He's not having any palpitations, presyncope or syncope. He's having no PND or orthopnea.  He actually feels quite well.   Allergies  Allergen Reactions  . Penicillins     REACTION: hives    Current Outpatient Prescriptions  Medication Sig Dispense Refill  . allopurinol (ZYLOPRIM) 100 MG tablet TAKE 1 TABLET EVERY DAY AS DIRECTED  30 tablet  6  . ALPRAZolam (XANAX) 0.5 MG tablet Take 1 tablet (0.5 mg total) by mouth every 6 (six) hours as needed for anxiety.  120 tablet  5  . amLODipine (NORVASC) 5 MG tablet Take 1 tablet (5 mg total) by mouth daily.  1 tablet  3  . aspirin 81 MG tablet Take 81 mg by mouth daily.      . B Complex Vitamins (GNP VITAMIN B COMPLEX PO) Take by mouth daily.      . B-D ULTRAFINE III SHORT PEN 31G X 8 MM MISC USE TO TEST TWICE A DAY  100 each  1  . calcium carbonate (TUMS - DOSED IN MG ELEMENTAL CALCIUM) 500 MG chewable tablet Chew 1 tablet by mouth daily.       . carvedilol (COREG) 3.125 MG tablet Take 12.5 mg by mouth 2 (two) times daily.       Mariane Baumgarten Sodium (COLACE PO) Take by mouth 2 (two) times daily.      . furosemide (LASIX) 40 MG tablet Take 40 mg by mouth daily. As needed      . Insulin Glargine (LANTUS SOLOSTAR) 100 UNIT/ML SOPN INJECT 32 UNITS AT BEDTIME      . insulin lispro (HUMALOG) 100 UNIT/ML injection Sliding scale  10 mL  12  . Insulin Pen Needle (B-D ULTRAFINE III SHORT PEN) 31G X 8 MM MISC Diagnosis code is 250.00  100 each  1  . lactulose (CHRONULAC) 10 GM/15ML solution 10 g as needed.       Marland Kitchen levothyroxine (SYNTHROID, LEVOTHROID) 25 MCG tablet Take 25 mcg by mouth daily.       . mycophenolate (MYFORTIC) 180 MG EC  tablet Take 1 tablet (180 mg total) by mouth 2 (two) times daily.  2 tablet  0  . Omega-3 Fatty Acids (FISH OIL) 1000 MG CAPS Take by mouth.      Marland Kitchen omeprazole (PRILOSEC) 20 MG capsule Take 20 mg by mouth daily.       . ONE TOUCH ULTRA TEST test strip USE WITH GLUCOSE MONITOR 3 TIMES A DAY  100 each  1  . oxyCODONE (OXY IR/ROXICODONE) 5 MG immediate release tablet Take 1 tablet (5 mg total) by mouth every 6 (six) hours as needed for moderate pain.  60 tablet  0  . simvastatin (ZOCOR) 10 MG tablet Take 1 tablet (10 mg total) by mouth at bedtime.  1 tablet  0  . sulfamethoxazole-trimethoprim (BACTRIM,SEPTRA) 400-80 MG per tablet Take 1 tablet by mouth as directed. Monday Wednesday and friday      . tacrolimus (PROGRAF) 1 MG capsule Take 3 capsules (3 mg total) by mouth 2 (two) times daily.  6 capsule  0  . temazepam (RESTORIL) 30 MG capsule Take  30 mg by mouth at bedtime as needed.        . traMADol (ULTRAM) 50 MG tablet Take 50 mg by mouth every 6 (six) hours as needed.       . colchicine 0.6 MG tablet Take 0.6 mg by mouth daily as needed.       No current facility-administered medications for this visit.    Past Medical History  Diagnosis Date  . CAD (coronary artery disease)   . Hypertension   . Hyperlipidemia   . Myocardial infarction   . Diabetes mellitus   . Gout   . Obesity   . Chronic kidney disease     chronic renal failure  . Atrial fibrillation   . CVA (cerebral infarction) 05/2010  . Thrombocytopenia 2011    sees Dr. Lamonte Sakai  . Splenomegaly 2012  . Anemia associated with chronic renal failure   . Aortic stenosis   . Anemia 01/02/2012  . Thrombocytopenia     Past Surgical History  Procedure Laterality Date  . Appendectomy    . Coronary artery bypass graft      June 2 011 Limited the LAD, SVG to OM, SVG to PDA  . Bone spurs      from right heel  . Aortic valve replacement      Pericardial tissue valve  . Kidney transplant  01-20-12    at [place]:  As  stated in the HPI and negative for all other systems.  PHYSICAL EXAM BP 148/80  Pulse 71  Ht 6' (1.829 m)  Wt 234 lb (106.142 kg)  BMI 31.73 kg/m2 GENERAL:  Well appearing NECK:  No jugular venous distention, waveform within normal limits, carotid upstroke brisk and symmetric, no bruits, no thyromegaly LYMPHATICS:  No cervical, inguinal adenopathy LUNGS:  Clear to auscultation bilaterally CHEST:  Well healed sternotomy scar. HEART:  PMI not displaced or sustained,S1 and S2 within normal limits, no S3, no S4, no clicks, no rubs, brief apical systolic murmur ABD:  Flat, positive bowel sounds normal in frequency in pitch, no bruits, no rebound, no guarding, no midline pulsatile mass, no hepatomegaly, no splenomegaly, well healed renal transplant scar, mildly obese EXT:  2 plus pulses throughout, right upper dialysis fistula, no edema, no cyanosis no clubbing SKIN:  No rashes no nodules  EKG:  Sinus rhythm, right axis deviation, poor anterior R wave progression,  rate 69, QTC slightly prolonged, no acute ST-T wave changes.  No change from previous 10/29/2013   ASSESSMENT AND PLAN  S/P aortic valve replacement -  His last echo in 2013 showed stable AVR.  No further imaging is indicated.    CORONARY ARTERY DISEASE -  The patient has no new sypmtoms. He has had no change since his CABG. He will continue with risk reduction.  HYPERTENSION - His BP is very mildly elevated but this is not the usual.  No change in therapy is indicated.   Dyslipidemia - I reviewed labs with an LDL of 95.  He will remain on the meds as listed.   CAROTID STENOSIS - He is overdue for Doppler and I will arrange this.   OBEISTY - He has put on some weight and we talked about calorie counting.

## 2013-10-31 ENCOUNTER — Other Ambulatory Visit: Payer: Self-pay | Admitting: Family Medicine

## 2013-11-04 ENCOUNTER — Ambulatory Visit (HOSPITAL_COMMUNITY): Payer: Medicare Other | Attending: Cardiology

## 2013-11-04 DIAGNOSIS — I6529 Occlusion and stenosis of unspecified carotid artery: Secondary | ICD-10-CM | POA: Diagnosis not present

## 2013-11-04 DIAGNOSIS — E785 Hyperlipidemia, unspecified: Secondary | ICD-10-CM | POA: Insufficient documentation

## 2013-11-04 DIAGNOSIS — I2510 Atherosclerotic heart disease of native coronary artery without angina pectoris: Secondary | ICD-10-CM | POA: Insufficient documentation

## 2013-11-04 DIAGNOSIS — E119 Type 2 diabetes mellitus without complications: Secondary | ICD-10-CM | POA: Diagnosis not present

## 2013-11-04 DIAGNOSIS — Z8673 Personal history of transient ischemic attack (TIA), and cerebral infarction without residual deficits: Secondary | ICD-10-CM | POA: Insufficient documentation

## 2013-11-04 DIAGNOSIS — I658 Occlusion and stenosis of other precerebral arteries: Secondary | ICD-10-CM | POA: Insufficient documentation

## 2013-11-04 DIAGNOSIS — I10 Essential (primary) hypertension: Secondary | ICD-10-CM | POA: Diagnosis not present

## 2013-11-04 DIAGNOSIS — Z951 Presence of aortocoronary bypass graft: Secondary | ICD-10-CM | POA: Insufficient documentation

## 2013-11-04 DIAGNOSIS — I6523 Occlusion and stenosis of bilateral carotid arteries: Secondary | ICD-10-CM

## 2013-11-10 DIAGNOSIS — Z940 Kidney transplant status: Secondary | ICD-10-CM | POA: Diagnosis not present

## 2013-11-10 DIAGNOSIS — Z79899 Other long term (current) drug therapy: Secondary | ICD-10-CM | POA: Diagnosis not present

## 2013-12-13 DIAGNOSIS — Z79899 Other long term (current) drug therapy: Secondary | ICD-10-CM | POA: Diagnosis not present

## 2013-12-13 DIAGNOSIS — Z940 Kidney transplant status: Secondary | ICD-10-CM | POA: Diagnosis not present

## 2013-12-23 DIAGNOSIS — Z940 Kidney transplant status: Secondary | ICD-10-CM | POA: Diagnosis not present

## 2013-12-23 DIAGNOSIS — E119 Type 2 diabetes mellitus without complications: Secondary | ICD-10-CM | POA: Diagnosis not present

## 2013-12-23 DIAGNOSIS — B349 Viral infection, unspecified: Secondary | ICD-10-CM | POA: Diagnosis not present

## 2013-12-23 DIAGNOSIS — I10 Essential (primary) hypertension: Secondary | ICD-10-CM | POA: Diagnosis not present

## 2013-12-30 ENCOUNTER — Other Ambulatory Visit: Payer: Self-pay | Admitting: Family Medicine

## 2014-01-06 ENCOUNTER — Other Ambulatory Visit: Payer: Self-pay | Admitting: Family Medicine

## 2014-01-20 DIAGNOSIS — Z940 Kidney transplant status: Secondary | ICD-10-CM | POA: Diagnosis not present

## 2014-01-20 DIAGNOSIS — Z79899 Other long term (current) drug therapy: Secondary | ICD-10-CM | POA: Diagnosis not present

## 2014-02-08 DIAGNOSIS — E1149 Type 2 diabetes mellitus with other diabetic neurological complication: Secondary | ICD-10-CM | POA: Diagnosis not present

## 2014-02-24 DIAGNOSIS — I4891 Unspecified atrial fibrillation: Secondary | ICD-10-CM | POA: Diagnosis not present

## 2014-02-24 DIAGNOSIS — Z940 Kidney transplant status: Secondary | ICD-10-CM | POA: Diagnosis not present

## 2014-02-24 DIAGNOSIS — Z951 Presence of aortocoronary bypass graft: Secondary | ICD-10-CM | POA: Diagnosis not present

## 2014-02-24 DIAGNOSIS — I10 Essential (primary) hypertension: Secondary | ICD-10-CM | POA: Diagnosis not present

## 2014-02-24 DIAGNOSIS — E118 Type 2 diabetes mellitus with unspecified complications: Secondary | ICD-10-CM | POA: Diagnosis not present

## 2014-02-24 DIAGNOSIS — D899 Disorder involving the immune mechanism, unspecified: Secondary | ICD-10-CM | POA: Diagnosis not present

## 2014-02-24 DIAGNOSIS — Z859 Personal history of malignant neoplasm, unspecified: Secondary | ICD-10-CM | POA: Diagnosis not present

## 2014-02-24 LAB — LIPID PANEL
Cholesterol: 167 mg/dL (ref 0–200)
HDL: 51 mg/dL (ref 35–70)
LDL CALC: 102 mg/dL
Triglycerides: 101 mg/dL (ref 40–160)

## 2014-02-24 LAB — HEMOGLOBIN A1C: Hgb A1c MFr Bld: 6.6 % — AB (ref 4.0–6.0)

## 2014-03-28 ENCOUNTER — Ambulatory Visit (INDEPENDENT_AMBULATORY_CARE_PROVIDER_SITE_OTHER): Payer: Medicare Other | Admitting: Family Medicine

## 2014-03-28 ENCOUNTER — Encounter: Payer: Self-pay | Admitting: Family Medicine

## 2014-03-28 VITALS — BP 119/69 | HR 70 | Temp 98.3°F | Ht 72.0 in | Wt 234.0 lb

## 2014-03-28 DIAGNOSIS — I2510 Atherosclerotic heart disease of native coronary artery without angina pectoris: Secondary | ICD-10-CM | POA: Diagnosis not present

## 2014-03-28 DIAGNOSIS — M25519 Pain in unspecified shoulder: Secondary | ICD-10-CM | POA: Diagnosis not present

## 2014-03-28 MED ORDER — METHYLPREDNISOLONE 4 MG PO KIT: PACK | ORAL | Status: AC

## 2014-03-28 MED ORDER — TRAMADOL HCL 50 MG PO TABS: 100.0000 mg | ORAL_TABLET | Freq: Four times a day (QID) | ORAL | Status: DC | PRN

## 2014-03-28 NOTE — Progress Notes (Signed)
Pre visit review using our clinic review tool, if applicable. No additional management support is needed unless otherwise documented below in the visit note. 

## 2014-03-28 NOTE — Progress Notes (Signed)
   Subjective:    Patient ID: [name] Brown, male    DOB: 08-09-1936, 78 y.o.   MRN: [id number]  HPI Here for 3 weeks of right shoulder pain. No recent trauma but it has bothered him for years. Taking Tramadol and using heat.    Review of Systems  Constitutional: Negative.   Musculoskeletal: Positive for arthralgias.       Objective:   Physical Exam  Constitutional: He appears well-developed and well-nourished.  Musculoskeletal:  Tender around the lateral and posterior right shoulder. ROM is very limited. Crepitus is present           Assessment & Plan:  Probable arthritis. Refer to Orthopedics, he may benefit from a cortisone shot and PT.

## 2014-04-11 DIAGNOSIS — Z940 Kidney transplant status: Secondary | ICD-10-CM | POA: Diagnosis not present

## 2014-04-11 DIAGNOSIS — D649 Anemia, unspecified: Secondary | ICD-10-CM | POA: Diagnosis not present

## 2014-04-12 DIAGNOSIS — M25519 Pain in unspecified shoulder: Secondary | ICD-10-CM | POA: Diagnosis not present

## 2014-04-12 DIAGNOSIS — M19019 Primary osteoarthritis, unspecified shoulder: Secondary | ICD-10-CM | POA: Diagnosis not present

## 2014-04-12 DIAGNOSIS — M25619 Stiffness of unspecified shoulder, not elsewhere classified: Secondary | ICD-10-CM | POA: Diagnosis not present

## 2014-04-12 DIAGNOSIS — M7500 Adhesive capsulitis of unspecified shoulder: Secondary | ICD-10-CM | POA: Diagnosis not present

## 2014-04-19 DIAGNOSIS — I739 Peripheral vascular disease, unspecified: Secondary | ICD-10-CM | POA: Diagnosis not present

## 2014-04-19 DIAGNOSIS — E1059 Type 1 diabetes mellitus with other circulatory complications: Secondary | ICD-10-CM | POA: Diagnosis not present

## 2014-04-19 DIAGNOSIS — L608 Other nail disorders: Secondary | ICD-10-CM | POA: Diagnosis not present

## 2014-04-21 ENCOUNTER — Other Ambulatory Visit: Payer: Self-pay | Admitting: Family Medicine

## 2014-04-25 ENCOUNTER — Other Ambulatory Visit (HOSPITAL_COMMUNITY): Payer: Self-pay | Admitting: Cardiology

## 2014-04-25 ENCOUNTER — Other Ambulatory Visit: Payer: Self-pay | Admitting: Family Medicine

## 2014-04-25 DIAGNOSIS — I6529 Occlusion and stenosis of unspecified carotid artery: Secondary | ICD-10-CM

## 2014-04-26 DIAGNOSIS — I872 Venous insufficiency (chronic) (peripheral): Secondary | ICD-10-CM | POA: Diagnosis not present

## 2014-04-29 DIAGNOSIS — Z940 Kidney transplant status: Secondary | ICD-10-CM | POA: Diagnosis not present

## 2014-05-06 ENCOUNTER — Encounter (HOSPITAL_COMMUNITY): Payer: Medicare Other

## 2014-05-06 ENCOUNTER — Ambulatory Visit (HOSPITAL_COMMUNITY): Payer: Medicare Other | Attending: Internal Medicine | Admitting: *Deleted

## 2014-05-06 DIAGNOSIS — I10 Essential (primary) hypertension: Secondary | ICD-10-CM | POA: Diagnosis not present

## 2014-05-06 DIAGNOSIS — E119 Type 2 diabetes mellitus without complications: Secondary | ICD-10-CM | POA: Diagnosis not present

## 2014-05-06 DIAGNOSIS — I658 Occlusion and stenosis of other precerebral arteries: Secondary | ICD-10-CM | POA: Diagnosis not present

## 2014-05-06 DIAGNOSIS — E785 Hyperlipidemia, unspecified: Secondary | ICD-10-CM | POA: Diagnosis not present

## 2014-05-06 DIAGNOSIS — Z8673 Personal history of transient ischemic attack (TIA), and cerebral infarction without residual deficits: Secondary | ICD-10-CM | POA: Insufficient documentation

## 2014-05-06 DIAGNOSIS — I6529 Occlusion and stenosis of unspecified carotid artery: Secondary | ICD-10-CM | POA: Insufficient documentation

## 2014-05-06 NOTE — Progress Notes (Signed)
Carotid duplex complete 

## 2014-05-11 ENCOUNTER — Ambulatory Visit (INDEPENDENT_AMBULATORY_CARE_PROVIDER_SITE_OTHER)
Admission: RE | Admit: 2014-05-11 | Discharge: 2014-05-11 | Disposition: A | Discharge status: Home / Self Care | Payer: Medicare Other | Source: Ambulatory Visit | Attending: Family Medicine | Admitting: Family Medicine

## 2014-05-11 ENCOUNTER — Encounter: Payer: Self-pay | Admitting: Family Medicine

## 2014-05-11 ENCOUNTER — Ambulatory Visit (INDEPENDENT_AMBULATORY_CARE_PROVIDER_SITE_OTHER): Payer: Medicare Other | Admitting: Family Medicine

## 2014-05-11 VITALS — BP 118/58 | HR 85 | Temp 100.0°F | Ht 72.0 in | Wt 238.0 lb

## 2014-05-11 DIAGNOSIS — J189 Pneumonia, unspecified organism: Secondary | ICD-10-CM

## 2014-05-11 DIAGNOSIS — I2510 Atherosclerotic heart disease of native coronary artery without angina pectoris: Secondary | ICD-10-CM | POA: Diagnosis not present

## 2014-05-11 DIAGNOSIS — J449 Chronic obstructive pulmonary disease, unspecified: Secondary | ICD-10-CM | POA: Diagnosis not present

## 2014-05-11 MED ORDER — CEFTRIAXONE SODIUM 1 G IJ SOLR
1.0000 g | Freq: Once | INTRAMUSCULAR | Status: AC
  Administered 2014-05-11: 1 g via INTRAMUSCULAR

## 2014-05-11 MED ORDER — LEVOFLOXACIN 500 MG PO TABS: 500.0000 mg | ORAL_TABLET | Freq: Every day | ORAL | Status: AC

## 2014-05-11 NOTE — Addendum Note (Signed)
Addended by: Aggie Hacker A on: 05/11/2014 12:03 PM   Modules accepted: Orders

## 2014-05-11 NOTE — Progress Notes (Signed)
Pre visit review using our clinic review tool, if applicable. No additional management support is needed unless otherwise documented below in the visit note. 

## 2014-05-11 NOTE — Progress Notes (Signed)
   Subjective:    Patient ID: [name] Brown, male    DOB: May 30, 1936, 77 y.o.   MRN: [id number]  HPI Here for 4 days of fever to 101.7 degrees, coughing up dark sputum, and SOB. No chest pain.    Review of Systems  Constitutional: Positive for fever, chills and diaphoresis.  HENT: Negative.   Eyes: Negative.   Respiratory: Positive for cough, chest tightness and shortness of breath. Negative for wheezing.   Cardiovascular: Negative.        Objective:   Physical Exam  Constitutional: He appears well-developed and well-nourished.  Neck: No thyromegaly present.  Cardiovascular: Normal rate, regular rhythm, normal heart sounds and intact distal pulses.   Pulmonary/Chest: Effort normal. No respiratory distress. He has no wheezes.  Soft rhonchi and rales are present over the RML   Lymphadenopathy:    He has no cervical adenopathy.          Assessment & Plan:  Probable RML pneumonia. Given a shot of Rocephin along with a course of Levaquin. He will get a CXR after this.

## 2014-05-12 ENCOUNTER — Telehealth: Payer: Self-pay | Admitting: Family Medicine

## 2014-05-12 NOTE — Telephone Encounter (Signed)
I spoke with pt and gave results.  

## 2014-05-12 NOTE — Telephone Encounter (Signed)
Pt would like xray results from yesterday

## 2014-05-16 ENCOUNTER — Other Ambulatory Visit: Payer: Self-pay | Admitting: Family Medicine

## 2014-05-17 NOTE — Telephone Encounter (Signed)
I did not see this medication listed in pt's chart?

## 2014-05-18 NOTE — Telephone Encounter (Signed)
Refill for one year 

## 2014-05-27 ENCOUNTER — Encounter: Payer: Self-pay | Admitting: Family Medicine

## 2014-05-27 ENCOUNTER — Other Ambulatory Visit: Payer: Self-pay | Admitting: Family Medicine

## 2014-05-31 DIAGNOSIS — D649 Anemia, unspecified: Secondary | ICD-10-CM | POA: Diagnosis not present

## 2014-05-31 DIAGNOSIS — Z940 Kidney transplant status: Secondary | ICD-10-CM | POA: Diagnosis not present

## 2014-06-06 DIAGNOSIS — Z940 Kidney transplant status: Secondary | ICD-10-CM | POA: Diagnosis not present

## 2014-06-06 DIAGNOSIS — E119 Type 2 diabetes mellitus without complications: Secondary | ICD-10-CM | POA: Diagnosis not present

## 2014-06-06 DIAGNOSIS — B349 Viral infection, unspecified: Secondary | ICD-10-CM | POA: Diagnosis not present

## 2014-06-06 DIAGNOSIS — I10 Essential (primary) hypertension: Secondary | ICD-10-CM | POA: Diagnosis not present

## 2014-06-30 DIAGNOSIS — I739 Peripheral vascular disease, unspecified: Secondary | ICD-10-CM | POA: Diagnosis not present

## 2014-06-30 DIAGNOSIS — E1059 Type 1 diabetes mellitus with other circulatory complications: Secondary | ICD-10-CM | POA: Diagnosis not present

## 2014-06-30 DIAGNOSIS — L608 Other nail disorders: Secondary | ICD-10-CM | POA: Diagnosis not present

## 2014-07-06 DIAGNOSIS — Z940 Kidney transplant status: Secondary | ICD-10-CM | POA: Diagnosis not present

## 2014-07-06 DIAGNOSIS — D649 Anemia, unspecified: Secondary | ICD-10-CM | POA: Diagnosis not present

## 2014-07-20 ENCOUNTER — Ambulatory Visit (INDEPENDENT_AMBULATORY_CARE_PROVIDER_SITE_OTHER): Payer: Medicare Other | Admitting: Family Medicine

## 2014-07-20 ENCOUNTER — Encounter: Payer: Self-pay | Admitting: Family Medicine

## 2014-07-20 VITALS — BP 108/71 | HR 80 | Temp 99.3°F | Ht 72.0 in | Wt 234.0 lb

## 2014-07-20 DIAGNOSIS — E119 Type 2 diabetes mellitus without complications: Secondary | ICD-10-CM

## 2014-07-20 DIAGNOSIS — I509 Heart failure, unspecified: Secondary | ICD-10-CM

## 2014-07-20 DIAGNOSIS — Z23 Encounter for immunization: Secondary | ICD-10-CM | POA: Diagnosis not present

## 2014-07-20 DIAGNOSIS — I2510 Atherosclerotic heart disease of native coronary artery without angina pectoris: Secondary | ICD-10-CM | POA: Diagnosis not present

## 2014-07-20 DIAGNOSIS — I10 Essential (primary) hypertension: Secondary | ICD-10-CM | POA: Diagnosis not present

## 2014-07-20 DIAGNOSIS — H6123 Impacted cerumen, bilateral: Secondary | ICD-10-CM

## 2014-07-20 DIAGNOSIS — H6120 Impacted cerumen, unspecified ear: Secondary | ICD-10-CM

## 2014-07-20 MED ORDER — METFORMIN HCL 1000 MG PO TABS: 1000.0000 mg | ORAL_TABLET | Freq: Two times a day (BID) | ORAL | Status: DC

## 2014-07-20 NOTE — Progress Notes (Signed)
   Subjective:    Patient ID: [name] Brown, male    DOB: [birth year], 78 y.o.   MRN: [id number]  HPI Here for several issues. First his ears feel stopped up again and he needs to have them flushed out. He wants a flu shot. He wants to check his BP. Also he is concerned that his insulin doses are going up and his glucoses are frequently in the 200s, even though his diet has not changed.   Review of Systems  Constitutional: Negative.   Respiratory: Negative.   Cardiovascular: Negative.   Genitourinary: Negative.        Objective:   Physical Exam  Constitutional: He appears well-developed and well-nourished.  HENT:  Both ear canals are full of cerumen  Cardiovascular: Normal rate, regular rhythm, normal heart sounds and intact distal pulses.   Pulmonary/Chest: Effort normal and breath sounds normal.  Musculoskeletal: He exhibits no edema.          Assessment & Plan:  We will add Metformin back to his regimen at 1000 mg bid, and hopefully this will decrease his insulin needs. The ears were irrigated clear with water. Given a flu shot.

## 2014-07-20 NOTE — Addendum Note (Signed)
Addended by: Aggie Hacker A on: 07/20/2014 12:26 PM   Modules accepted: Orders

## 2014-07-20 NOTE — Progress Notes (Signed)
Pre visit review using our clinic review tool, if applicable. No additional management support is needed unless otherwise documented below in the visit note. 

## 2014-07-21 ENCOUNTER — Telehealth: Payer: Self-pay | Admitting: Family Medicine

## 2014-07-21 NOTE — Telephone Encounter (Signed)
emmi emailed °

## 2014-08-01 ENCOUNTER — Other Ambulatory Visit: Payer: Self-pay | Admitting: Family Medicine

## 2014-08-05 ENCOUNTER — Other Ambulatory Visit: Payer: Self-pay

## 2014-08-07 ENCOUNTER — Other Ambulatory Visit: Payer: Self-pay | Admitting: Family Medicine

## 2014-08-08 ENCOUNTER — Other Ambulatory Visit: Payer: Self-pay | Admitting: Family Medicine

## 2014-08-16 DIAGNOSIS — E785 Hyperlipidemia, unspecified: Secondary | ICD-10-CM | POA: Diagnosis not present

## 2014-08-16 DIAGNOSIS — E119 Type 2 diabetes mellitus without complications: Secondary | ICD-10-CM | POA: Diagnosis not present

## 2014-08-16 DIAGNOSIS — D649 Anemia, unspecified: Secondary | ICD-10-CM | POA: Diagnosis not present

## 2014-08-16 DIAGNOSIS — Z940 Kidney transplant status: Secondary | ICD-10-CM | POA: Diagnosis not present

## 2014-09-06 DIAGNOSIS — Z940 Kidney transplant status: Secondary | ICD-10-CM | POA: Diagnosis not present

## 2014-09-06 DIAGNOSIS — E119 Type 2 diabetes mellitus without complications: Secondary | ICD-10-CM | POA: Diagnosis not present

## 2014-09-06 DIAGNOSIS — I10 Essential (primary) hypertension: Secondary | ICD-10-CM | POA: Diagnosis not present

## 2014-09-06 DIAGNOSIS — I480 Paroxysmal atrial fibrillation: Secondary | ICD-10-CM | POA: Diagnosis not present

## 2014-09-07 ENCOUNTER — Encounter: Payer: Self-pay | Admitting: Cardiology

## 2014-09-08 DIAGNOSIS — D225 Melanocytic nevi of trunk: Secondary | ICD-10-CM | POA: Diagnosis not present

## 2014-09-08 DIAGNOSIS — L814 Other melanin hyperpigmentation: Secondary | ICD-10-CM | POA: Diagnosis not present

## 2014-09-08 DIAGNOSIS — L821 Other seborrheic keratosis: Secondary | ICD-10-CM | POA: Diagnosis not present

## 2014-09-08 DIAGNOSIS — L570 Actinic keratosis: Secondary | ICD-10-CM | POA: Diagnosis not present

## 2014-09-13 ENCOUNTER — Telehealth: Payer: Self-pay | Admitting: Family Medicine

## 2014-09-13 MED ORDER — OXYCODONE HCL 5 MG PO TABS: 5.0000 mg | ORAL_TABLET | Freq: Four times a day (QID) | ORAL | Status: DC | PRN

## 2014-09-13 NOTE — Telephone Encounter (Signed)
He needs refills for Oxycodone

## 2014-09-22 ENCOUNTER — Telehealth: Payer: Self-pay | Admitting: Family Medicine

## 2014-09-22 DIAGNOSIS — L84 Corns and callosities: Secondary | ICD-10-CM | POA: Diagnosis not present

## 2014-09-22 DIAGNOSIS — L603 Nail dystrophy: Secondary | ICD-10-CM | POA: Diagnosis not present

## 2014-09-22 DIAGNOSIS — I739 Peripheral vascular disease, unspecified: Secondary | ICD-10-CM | POA: Diagnosis not present

## 2014-09-22 DIAGNOSIS — E1042 Type 1 diabetes mellitus with diabetic polyneuropathy: Secondary | ICD-10-CM | POA: Diagnosis not present

## 2014-09-28 NOTE — Telephone Encounter (Signed)
error 

## 2014-09-30 DIAGNOSIS — D649 Anemia, unspecified: Secondary | ICD-10-CM | POA: Diagnosis not present

## 2014-09-30 DIAGNOSIS — B349 Viral infection, unspecified: Secondary | ICD-10-CM | POA: Diagnosis not present

## 2014-09-30 DIAGNOSIS — E039 Hypothyroidism, unspecified: Secondary | ICD-10-CM | POA: Diagnosis not present

## 2014-09-30 DIAGNOSIS — Z940 Kidney transplant status: Secondary | ICD-10-CM | POA: Diagnosis not present

## 2014-10-10 ENCOUNTER — Other Ambulatory Visit: Payer: Self-pay | Admitting: Family Medicine

## 2014-10-10 NOTE — Telephone Encounter (Signed)
Called to the pharmacy and left on machine. 

## 2014-10-10 NOTE — Telephone Encounter (Signed)
Call in #[phone] with one rf 

## 2014-10-22 ENCOUNTER — Other Ambulatory Visit: Payer: Self-pay | Admitting: Family Medicine

## 2014-10-27 DIAGNOSIS — E039 Hypothyroidism, unspecified: Secondary | ICD-10-CM | POA: Diagnosis not present

## 2014-10-27 DIAGNOSIS — M109 Gout, unspecified: Secondary | ICD-10-CM | POA: Diagnosis not present

## 2014-10-27 DIAGNOSIS — E119 Type 2 diabetes mellitus without complications: Secondary | ICD-10-CM | POA: Diagnosis not present

## 2014-10-27 DIAGNOSIS — Z940 Kidney transplant status: Secondary | ICD-10-CM | POA: Diagnosis not present

## 2014-10-27 DIAGNOSIS — N2581 Secondary hyperparathyroidism of renal origin: Secondary | ICD-10-CM | POA: Diagnosis not present

## 2014-10-31 ENCOUNTER — Encounter: Payer: Self-pay | Admitting: Cardiology

## 2014-10-31 ENCOUNTER — Ambulatory Visit (INDEPENDENT_AMBULATORY_CARE_PROVIDER_SITE_OTHER): Payer: Medicare Other | Admitting: Cardiology

## 2014-10-31 VITALS — BP 100/72 | HR 68 | Ht 71.5 in | Wt 230.8 lb

## 2014-10-31 DIAGNOSIS — I10 Essential (primary) hypertension: Secondary | ICD-10-CM | POA: Diagnosis not present

## 2014-10-31 DIAGNOSIS — I481 Persistent atrial fibrillation: Secondary | ICD-10-CM

## 2014-10-31 DIAGNOSIS — I4819 Other persistent atrial fibrillation: Secondary | ICD-10-CM

## 2014-10-31 NOTE — Progress Notes (Signed)
HPI The patient presents for follow up of his AVR and CAD.  Since I last saw him he has done well. He status post renal transplant a few years ago.Marland Kitchen He is followed closely at [facility]. He is not walking because he was told to not use a treadmill and he cannot go outside because of skin cancer.  With his current level of activity he does not describe chest pressure, neck or arm discomfort. He's not having any palpitations, presyncope or syncope. He's having no PND or orthopnea.    Allergies  Allergen Reactions  . Penicillins     REACTION: hives    Current Outpatient Prescriptions  Medication Sig Dispense Refill  . allopurinol (ZYLOPRIM) 100 MG tablet TAKE 1 TABLET BY MOUTH EVERY DAY AS DIRECTED 30 tablet 3  . ALPRAZolam (XANAX) 0.5 MG tablet Take 1 tablet (0.5 mg total) by mouth every 6 (six) hours as needed for anxiety. 120 tablet 5  . amLODipine (NORVASC) 5 MG tablet Take 1 tablet (5 mg total) by mouth daily. 1 tablet 3  . aspirin 81 MG tablet Take 81 mg by mouth daily.    . B-D ULTRAFINE III SHORT PEN 31G X 8 MM MISC USE TO TEST TWICE A DAY 100 each 1  . calcium carbonate (TUMS - DOSED IN MG ELEMENTAL CALCIUM) 500 MG chewable tablet Chew 1 tablet by mouth daily.     . carvedilol (COREG) 12.5 MG tablet Take 1 tablet by mouth 2 (two) times daily.    . colchicine 0.6 MG tablet Take 0.6 mg by mouth daily as needed.    Mariane Baumgarten Sodium (COLACE PO) Take by mouth 2 (two) times daily.    . furosemide (LASIX) 40 MG tablet Take 40 mg by mouth daily.     Marland Kitchen gabapentin (NEURONTIN) 100 MG capsule TAKE ONE CAPSULE 3 TIMES A DAY (Patient taking differently: TAKE ONE CAPSULE 1 TIMES A DAY) 90 capsule 3  . Insulin Glargine (LANTUS SOLOSTAR) 100 UNIT/ML Solostar Pen 36-40 units    . insulin lispro (HUMALOG) 100 UNIT/ML injection Sliding scale 10 mL 12  . Krill Oil 1000 MG CAPS Take 1,000 mg by mouth daily.    Marland Kitchen lactulose (CHRONULAC) 10 GM/15ML solution 10 g as needed.     Marland Kitchen levothyroxine (SYNTHROID,  LEVOTHROID) 25 MCG tablet Take 25 mcg by mouth daily.     . metFORMIN (GLUCOPHAGE) 1000 MG tablet Take 1 tablet (1,000 mg total) by mouth 2 (two) times daily with a meal. 180 tablet 3  . mycophenolate (MYFORTIC) 180 MG EC tablet Take 1 tablet (180 mg total) by mouth 2 (two) times daily. 2 tablet 0  . omeprazole (PRILOSEC) 20 MG capsule Take 20 mg by mouth daily.     . ONE TOUCH ULTRA TEST test strip USE TO TEST BLOOD SUGAR 3 TIMES A DAY 100 each 1  . oxyCODONE (OXY IR/ROXICODONE) 5 MG immediate release tablet Take 1 tablet (5 mg total) by mouth every 6 (six) hours as needed for moderate pain. 120 tablet 0  . simvastatin (ZOCOR) 10 MG tablet Take 1 tablet (10 mg total) by mouth at bedtime. 1 tablet 0  . sulfamethoxazole-trimethoprim (BACTRIM,SEPTRA) 400-80 MG per tablet Take 1 tablet by mouth as directed. Monday Wednesday and friday    . tacrolimus (PROGRAF) 1 MG capsule Take 3 capsules (3 mg total) by mouth 2 (two) times daily. 6 capsule 0  . temazepam (RESTORIL) 30 MG capsule Take 30 mg by mouth at bedtime as needed.      Marland Kitchen  traMADol (ULTRAM) 50 MG tablet TAKE 2 TABLETS (100 MG TOTAL) BY MOUTH EVERY 6 HOURS AS NEEDED FOR MODERATE PAIN 240 tablet 1   No current facility-administered medications for this visit.    Past Medical History  Diagnosis Date  . CAD (coronary artery disease)   . Hypertension   . Hyperlipidemia   . Myocardial infarction   . Diabetes mellitus   . Gout   . Obesity   . Chronic kidney disease     chronic renal failure  . Atrial fibrillation   . CVA (cerebral infarction) 05/2010  . Thrombocytopenia 2011    sees Dr. Lamonte Sakai  . Splenomegaly 2012  . Anemia associated with chronic renal failure   . Aortic stenosis   . Anemia 01/02/2012  . Thrombocytopenia     Past Surgical History  Procedure Laterality Date  . Appendectomy    . Coronary artery bypass graft      June 2 011 Limited the LAD, SVG to OM, SVG to PDA  . Bone spurs      from right heel  . Aortic valve  replacement      Pericardial tissue valve  . Kidney transplant  01-20-12    at [place]:  As stated in the HPI and negative for all other systems.  PHYSICAL EXAM BP 100/72 mmHg  Pulse 68  Ht 5' 11.5" (1.816 m)  Wt 230 lb 12.8 oz (104.69 kg)  BMI 31.74 kg/m2 GENERAL:  Well appearing NECK:  No jugular venous distention, waveform within normal limits, carotid upstroke brisk and symmetric, no bruits, no thyromegaly LYMPHATICS:  No cervical, inguinal adenopathy LUNGS:  Clear to auscultation bilaterally CHEST:  Well healed sternotomy scar. HEART:  PMI not displaced or sustained,S1 and S2 within normal limits, no S3, no S4, no clicks, no rubs, brief apical systolic murmur ABD:  Flat, positive bowel sounds normal in frequency in pitch, no bruits, no rebound, no guarding, no midline pulsatile mass, no hepatomegaly, no splenomegaly, well healed renal transplant scar, mildly obese EXT:  2 plus pulses throughout, right upper dialysis fistula, no edema, no cyanosis no clubbing SKIN:  No rashes no nodules  EKG:  Sinus rhythm, right axis deviation, poor anterior R wave progression,  rate 68, QTC slightly prolonged compared to EKG last year, no acute ST-T wave changes.   10/31/2014   ASSESSMENT AND PLAN  S/P aortic valve replacement -  His last echo in 2013 showed stable AVR.  No further imaging is indicated.    CORONARY ARTERY DISEASE -  The patient has no new sypmtoms. He has had no change since his CABG. He will continue with risk reduction.  HYPERTENSION - His BP is actually low.  However, at home it is fine since he reduced his Norvasc.  No change in therapy is indicated.   Dyslipidemia - I reviewed labs with an LDL of 70.  He will remain on the meds as listed.   CAROTID STENOSIS - He is do for follow up in July again.  He had right 40-59% stenosis in left 1-39% stenosis  QTC PROLONGED - We discussed the need to avoid QT prolonging drugs. He has no symptoms related to  this.

## 2014-10-31 NOTE — Patient Instructions (Signed)
Your physician wants you to follow-up in: 1 Year You will receive a reminder letter in the mail two months in advance. If you don't receive a letter, please call our office to schedule the follow-up appointment.  Avoid drugs that prolong QT Intervals

## 2014-11-02 DIAGNOSIS — Z940 Kidney transplant status: Secondary | ICD-10-CM | POA: Diagnosis not present

## 2014-11-07 DIAGNOSIS — D6959 Other secondary thrombocytopenia: Secondary | ICD-10-CM | POA: Diagnosis not present

## 2014-11-07 DIAGNOSIS — N189 Chronic kidney disease, unspecified: Secondary | ICD-10-CM | POA: Diagnosis not present

## 2014-11-12 IMAGING — CR DG CHEST 2V
2 series · 2 of 2 positions shown · non-contrast
Comparison: PA and lateral chest x-ray September 04, 2010

CLINICAL DATA: Cough congestion and fever for 4 days; history of
CABG and aortic valve replacement

EXAM:
CHEST  2 VIEW

[view not recorded (1 of 2)]
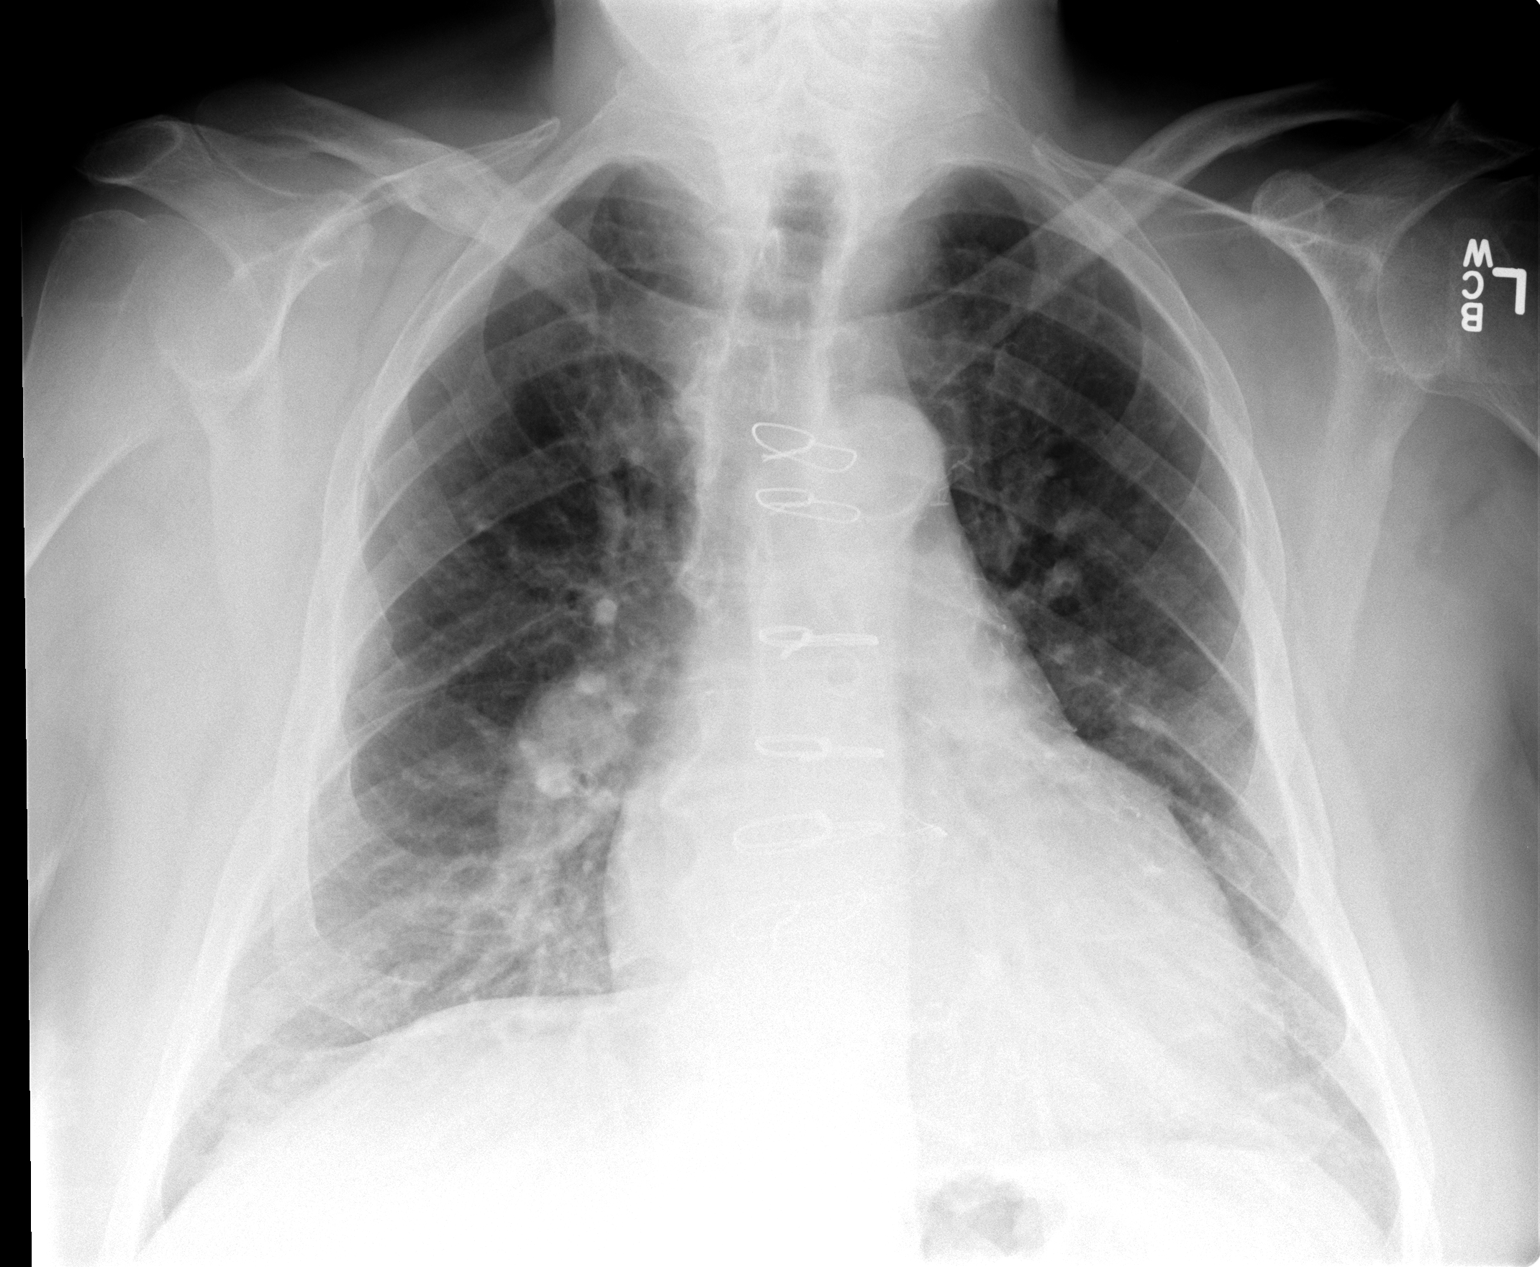

[view not recorded (2 of 2)]
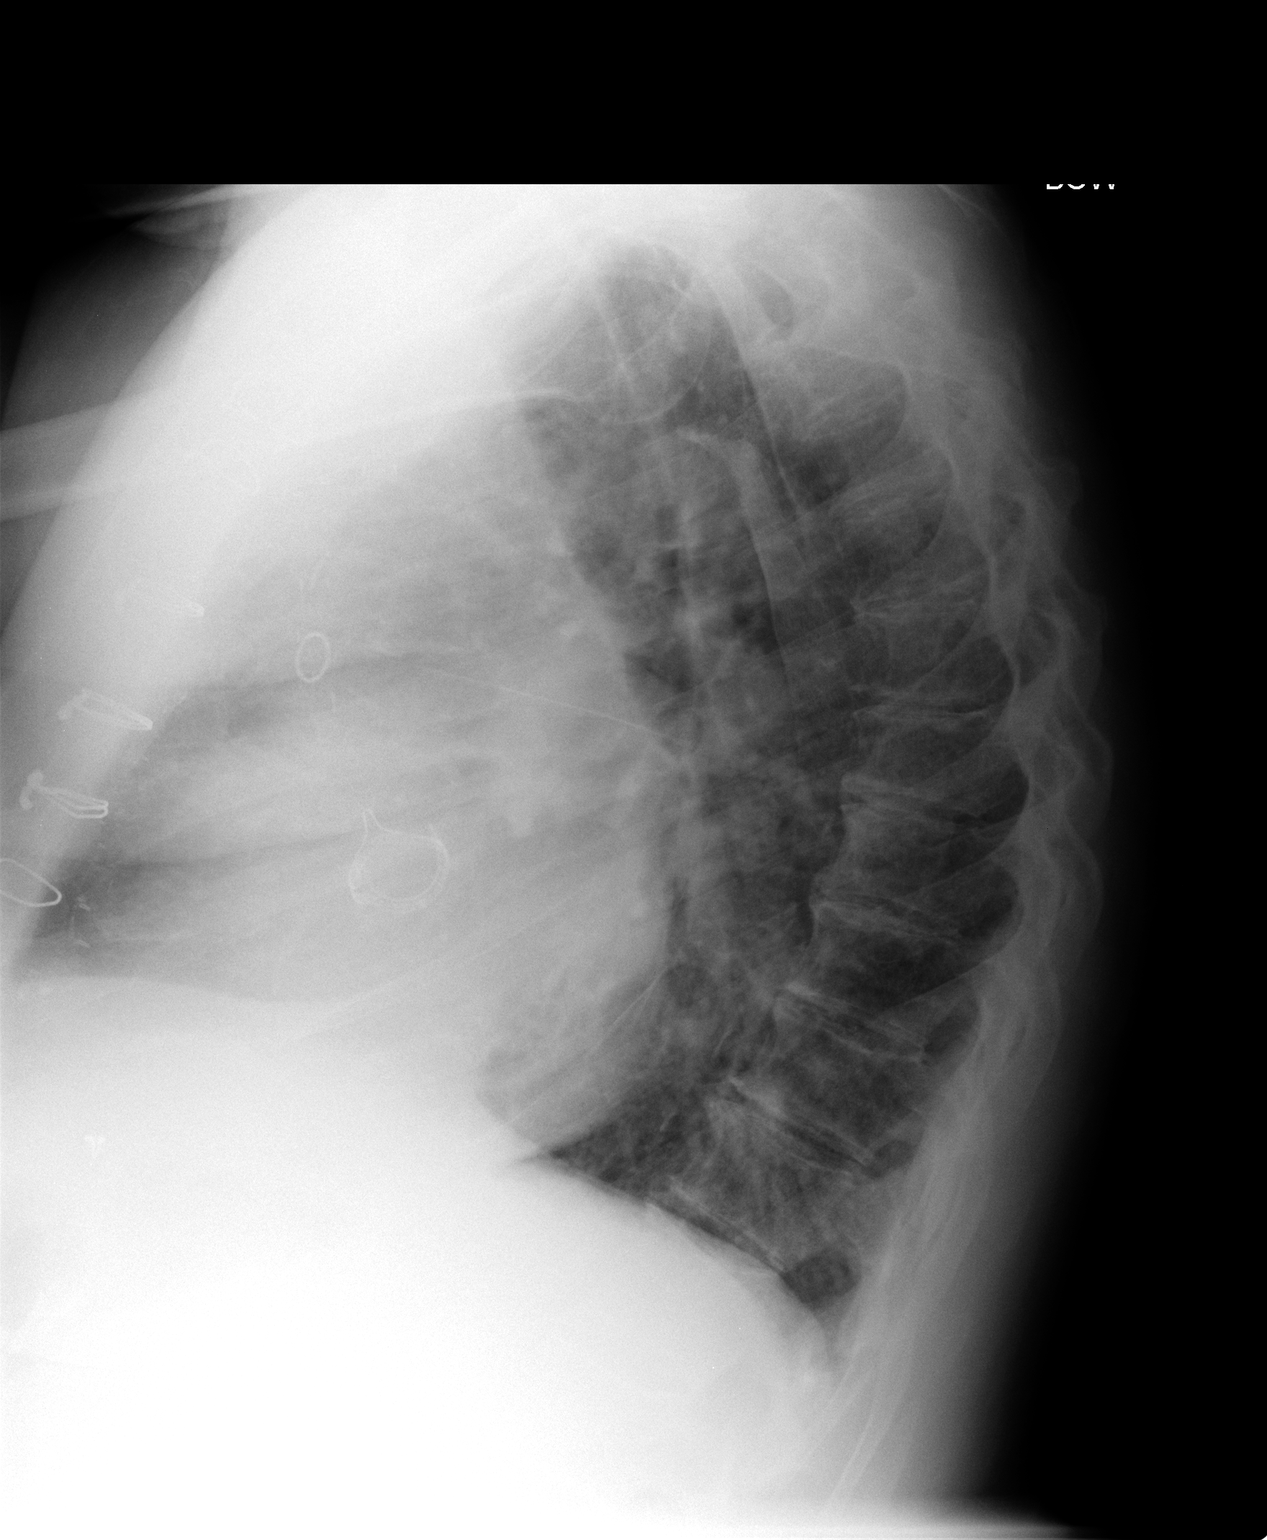

[2 of 2 positions shown; findings below may reference images not displayed]

FINDINGS: The lungs are well-expanded. There is no focal infiltrate. The heart
is mildly enlarged though stable. The central pulmonary vascularity
is prominent most conspicuously on the right. No cephalization of
the vascular tear is seen. There is no pleural effusion. The
prosthetic aortic valve ring is visible. There are 6 intact sternal
wires.
IMPRESSION: COPD and possible acute bronchitis. There is no evidence of
pneumonia nor pulmonary edema.

## 2014-11-14 DIAGNOSIS — Z940 Kidney transplant status: Secondary | ICD-10-CM | POA: Diagnosis not present

## 2014-11-21 NOTE — Addendum Note (Signed)
Addended by: Diana Eves on: 11/21/2014 01:01 PM   Modules accepted: Orders

## 2014-11-25 ENCOUNTER — Other Ambulatory Visit: Payer: Self-pay | Admitting: Family Medicine

## 2014-11-25 DIAGNOSIS — I10 Essential (primary) hypertension: Secondary | ICD-10-CM | POA: Diagnosis not present

## 2014-11-25 DIAGNOSIS — B349 Viral infection, unspecified: Secondary | ICD-10-CM | POA: Diagnosis not present

## 2014-11-25 DIAGNOSIS — E119 Type 2 diabetes mellitus without complications: Secondary | ICD-10-CM | POA: Diagnosis not present

## 2014-11-25 DIAGNOSIS — Z940 Kidney transplant status: Secondary | ICD-10-CM | POA: Diagnosis not present

## 2014-12-01 ENCOUNTER — Other Ambulatory Visit: Payer: Self-pay | Admitting: Physician Assistant

## 2014-12-01 DIAGNOSIS — L820 Inflamed seborrheic keratosis: Secondary | ICD-10-CM | POA: Diagnosis not present

## 2014-12-01 DIAGNOSIS — L570 Actinic keratosis: Secondary | ICD-10-CM | POA: Diagnosis not present

## 2014-12-05 ENCOUNTER — Other Ambulatory Visit: Payer: Self-pay | Admitting: Family Medicine

## 2014-12-06 ENCOUNTER — Other Ambulatory Visit: Payer: Self-pay | Admitting: Family Medicine

## 2014-12-06 DIAGNOSIS — L603 Nail dystrophy: Secondary | ICD-10-CM | POA: Diagnosis not present

## 2014-12-06 DIAGNOSIS — I739 Peripheral vascular disease, unspecified: Secondary | ICD-10-CM | POA: Diagnosis not present

## 2014-12-06 DIAGNOSIS — E1051 Type 1 diabetes mellitus with diabetic peripheral angiopathy without gangrene: Secondary | ICD-10-CM | POA: Diagnosis not present

## 2014-12-06 DIAGNOSIS — Z940 Kidney transplant status: Secondary | ICD-10-CM | POA: Diagnosis not present

## 2014-12-06 DIAGNOSIS — L84 Corns and callosities: Secondary | ICD-10-CM | POA: Diagnosis not present

## 2014-12-09 ENCOUNTER — Encounter: Payer: Self-pay | Admitting: Family Medicine

## 2014-12-09 ENCOUNTER — Ambulatory Visit (INDEPENDENT_AMBULATORY_CARE_PROVIDER_SITE_OTHER): Payer: Medicare Other | Admitting: Family Medicine

## 2014-12-09 VITALS — BP 122/67 | HR 85 | Temp 99.5°F | Ht 71.5 in | Wt 230.0 lb

## 2014-12-09 DIAGNOSIS — J40 Bronchitis, not specified as acute or chronic: Secondary | ICD-10-CM | POA: Diagnosis not present

## 2014-12-09 DIAGNOSIS — I4581 Long QT syndrome: Secondary | ICD-10-CM

## 2014-12-09 DIAGNOSIS — R9431 Abnormal electrocardiogram [ECG] [EKG]: Secondary | ICD-10-CM | POA: Insufficient documentation

## 2014-12-09 MED ORDER — CEFUROXIME AXETIL 500 MG PO TABS: 500.0000 mg | ORAL_TABLET | Freq: Two times a day (BID) | ORAL | Status: DC

## 2014-12-09 NOTE — Progress Notes (Signed)
Pre visit review using our clinic review tool, if applicable. No additional management support is needed unless otherwise documented below in the visit note. 

## 2014-12-09 NOTE — Progress Notes (Signed)
   Subjective:    Patient ID: [name] Brown, male    DOB: 18-May-1936, 79 y.o.   MRN: [id number]  HPI Here for one week of fever, PND, ST, chest congestion and coughing up yellow sputum.    Review of Systems  Constitutional: Positive for fever.  HENT: Positive for congestion and postnasal drip. Negative for sinus pressure.   Eyes: Negative.   Respiratory: Positive for cough and chest tightness. Negative for shortness of breath and wheezing.        Objective:   Physical Exam  Constitutional: He appears well-developed and well-nourished.  HENT:  Right Ear: External ear normal.  Left Ear: External ear normal.  Nose: Nose normal.  Mouth/Throat: Oropharynx is clear and moist.  Eyes: Conjunctivae are normal.  Pulmonary/Chest: Effort normal. He has no wheezes. He has no rales.  Scattered rhonchi, he is quite hoarse   Lymphadenopathy:    He has no cervical adenopathy.          Assessment & Plan:  Treat with Ceftin. Drink fluids

## 2014-12-20 ENCOUNTER — Ambulatory Visit (INDEPENDENT_AMBULATORY_CARE_PROVIDER_SITE_OTHER): Payer: Medicare Other | Admitting: Family Medicine

## 2014-12-20 ENCOUNTER — Encounter: Payer: Self-pay | Admitting: Family Medicine

## 2014-12-20 VITALS — BP 112/72 | HR 84 | Temp 98.3°F | Ht 71.5 in | Wt 232.0 lb

## 2014-12-20 DIAGNOSIS — J209 Acute bronchitis, unspecified: Secondary | ICD-10-CM | POA: Diagnosis not present

## 2014-12-20 MED ORDER — TEMAZEPAM 30 MG PO CAPS: 30.0000 mg | ORAL_CAPSULE | Freq: Every evening | ORAL | Status: DC | PRN

## 2014-12-20 MED ORDER — HYDROCODONE-HOMATROPINE 5-1.5 MG/5ML PO SYRP: 5.0000 mL | ORAL_SOLUTION | ORAL | Status: DC | PRN

## 2014-12-20 NOTE — Progress Notes (Signed)
   Subjective:    Patient ID: [name] Brown, male    DOB: 1936-04-24, 79 y.o.   MRN: [id number]  HPI Here for continuing dry cough which has persisted for several weeks. We saw him 10 days ago and gave him a course of Ceftin. He finished this and feels better in most ways with less SOB, more energy, and no fever.    Review of Systems  Constitutional: Negative.   HENT: Negative.   Eyes: Negative.   Respiratory: Positive for cough. Negative for choking, chest tightness, shortness of breath and wheezing.   Cardiovascular: Negative.        Objective:   Physical Exam  Constitutional: He appears well-developed and well-nourished.  HENT:  Right Ear: External ear normal.  Left Ear: External ear normal.  Nose: Nose normal.  Mouth/Throat: Oropharynx is clear and moist.  Eyes: Conjunctivae are normal.  Cardiovascular: Normal rate, regular rhythm, normal heart sounds and intact distal pulses.   Pulmonary/Chest: Effort normal and breath sounds normal. No respiratory distress. He has no rales.  Lymphadenopathy:    He has no cervical adenopathy.          Assessment & Plan:  This seems to be a lingering cough as the URI is resolving. Use Hydromet syrup prn. Recheck prn

## 2014-12-20 NOTE — Progress Notes (Signed)
Pre visit review using our clinic review tool, if applicable. No additional management support is needed unless otherwise documented below in the visit note. 

## 2014-12-23 ENCOUNTER — Other Ambulatory Visit: Payer: Self-pay | Admitting: Family Medicine

## 2014-12-26 ENCOUNTER — Telehealth: Payer: Self-pay | Admitting: Family Medicine

## 2014-12-26 MED ORDER — GLUCOSE BLOOD VI STRP
ORAL_STRIP | Status: DC
Start: 2014-12-26 — End: 2015-03-15

## 2014-12-26 NOTE — Telephone Encounter (Signed)
I resent script

## 2014-12-26 NOTE — Telephone Encounter (Signed)
Phil from [facility] calling to request script for ONE TOUCH ULTRA TEST test strip be sent again and with the ICD-10 code included on it.  They are unable to process rx without ICD-10

## 2015-01-06 DIAGNOSIS — Z940 Kidney transplant status: Secondary | ICD-10-CM | POA: Diagnosis not present

## 2015-01-11 ENCOUNTER — Encounter (HOSPITAL_COMMUNITY): Payer: Self-pay | Admitting: Nurse Practitioner

## 2015-01-11 ENCOUNTER — Observation Stay (HOSPITAL_COMMUNITY)
Admission: EM | Admit: 2015-01-11 | Discharge: 2015-01-12 | Disposition: A | Discharge status: Home / Self Care | Payer: Medicare Other | Attending: Internal Medicine | Admitting: Internal Medicine

## 2015-01-11 DIAGNOSIS — T383X1A Poisoning by insulin and oral hypoglycemic [antidiabetic] drugs, accidental (unintentional), initial encounter: Principal | ICD-10-CM | POA: Insufficient documentation

## 2015-01-11 DIAGNOSIS — Z940 Kidney transplant status: Secondary | ICD-10-CM | POA: Diagnosis not present

## 2015-01-11 DIAGNOSIS — Z7982 Long term (current) use of aspirin: Secondary | ICD-10-CM | POA: Diagnosis not present

## 2015-01-11 DIAGNOSIS — Z8679 Personal history of other diseases of the circulatory system: Secondary | ICD-10-CM | POA: Diagnosis not present

## 2015-01-11 DIAGNOSIS — Z951 Presence of aortocoronary bypass graft: Secondary | ICD-10-CM | POA: Diagnosis not present

## 2015-01-11 DIAGNOSIS — N183 Chronic kidney disease, stage 3 (moderate): Secondary | ICD-10-CM | POA: Diagnosis not present

## 2015-01-11 DIAGNOSIS — I2510 Atherosclerotic heart disease of native coronary artery without angina pectoris: Secondary | ICD-10-CM | POA: Diagnosis not present

## 2015-01-11 DIAGNOSIS — E669 Obesity, unspecified: Secondary | ICD-10-CM | POA: Insufficient documentation

## 2015-01-11 DIAGNOSIS — I252 Old myocardial infarction: Secondary | ICD-10-CM | POA: Insufficient documentation

## 2015-01-11 DIAGNOSIS — Z6831 Body mass index (BMI) 31.0-31.9, adult: Secondary | ICD-10-CM | POA: Diagnosis not present

## 2015-01-11 DIAGNOSIS — E785 Hyperlipidemia, unspecified: Secondary | ICD-10-CM | POA: Diagnosis not present

## 2015-01-11 DIAGNOSIS — Z952 Presence of prosthetic heart valve: Secondary | ICD-10-CM | POA: Diagnosis not present

## 2015-01-11 DIAGNOSIS — Z794 Long term (current) use of insulin: Secondary | ICD-10-CM | POA: Diagnosis not present

## 2015-01-11 DIAGNOSIS — I10 Essential (primary) hypertension: Secondary | ICD-10-CM | POA: Diagnosis not present

## 2015-01-11 DIAGNOSIS — E162 Hypoglycemia, unspecified: Secondary | ICD-10-CM

## 2015-01-11 DIAGNOSIS — I4891 Unspecified atrial fibrillation: Secondary | ICD-10-CM | POA: Diagnosis not present

## 2015-01-11 DIAGNOSIS — Z8673 Personal history of transient ischemic attack (TIA), and cerebral infarction without residual deficits: Secondary | ICD-10-CM | POA: Diagnosis not present

## 2015-01-11 DIAGNOSIS — N189 Chronic kidney disease, unspecified: Secondary | ICD-10-CM | POA: Diagnosis present

## 2015-01-11 DIAGNOSIS — I129 Hypertensive chronic kidney disease with stage 1 through stage 4 chronic kidney disease, or unspecified chronic kidney disease: Secondary | ICD-10-CM | POA: Insufficient documentation

## 2015-01-11 DIAGNOSIS — E11649 Type 2 diabetes mellitus with hypoglycemia without coma: Secondary | ICD-10-CM | POA: Diagnosis not present

## 2015-01-11 DIAGNOSIS — D696 Thrombocytopenia, unspecified: Secondary | ICD-10-CM | POA: Insufficient documentation

## 2015-01-11 DIAGNOSIS — D631 Anemia in chronic kidney disease: Secondary | ICD-10-CM

## 2015-01-11 DIAGNOSIS — N179 Acute kidney failure, unspecified: Secondary | ICD-10-CM | POA: Insufficient documentation

## 2015-01-11 DIAGNOSIS — M109 Gout, unspecified: Secondary | ICD-10-CM | POA: Insufficient documentation

## 2015-01-11 DIAGNOSIS — R161 Splenomegaly, not elsewhere classified: Secondary | ICD-10-CM | POA: Insufficient documentation

## 2015-01-11 DIAGNOSIS — Z9049 Acquired absence of other specified parts of digestive tract: Secondary | ICD-10-CM | POA: Diagnosis not present

## 2015-01-11 LAB — COMPREHENSIVE METABOLIC PANEL
ALK PHOS: 69 U/L (ref 39–117)
ALT: 11 U/L (ref 0–53)
ANION GAP: 9 (ref 5–15)
AST: 15 U/L (ref 0–37)
Albumin: 4.7 g/dL (ref 3.5–5.2)
BILIRUBIN TOTAL: 0.5 mg/dL (ref 0.3–1.2)
BUN: 25 mg/dL — AB (ref 6–23)
CO2: 26 mmol/L (ref 19–32)
Calcium: 9.1 mg/dL (ref 8.4–10.5)
Chloride: 105 mmol/L (ref 96–112)
Creatinine, Ser: 1.92 mg/dL — ABNORMAL HIGH (ref 0.50–1.35)
GFR calc Af Amer: 37 mL/min — ABNORMAL LOW (ref >90)
GFR calc non Af Amer: 32 mL/min — ABNORMAL LOW (ref >90)
Glucose, Bld: 102 mg/dL — ABNORMAL HIGH (ref 70–99)
Potassium: 4.1 mmol/L (ref 3.5–5.1)
Sodium: 140 mmol/L (ref 135–145)
Total Protein: 8 g/dL (ref 6.0–8.3)

## 2015-01-11 LAB — CBC WITH DIFFERENTIAL/PLATELET
Basophils Absolute: 0 10*3/uL (ref 0.0–0.1)
Basophils Relative: 0 % (ref 0–1)
EOS ABS: 0.4 10*3/uL (ref 0.0–0.7)
Eosinophils Relative: 6 % — ABNORMAL HIGH (ref 0–5)
HCT: 35.3 % — ABNORMAL LOW (ref 39.0–52.0)
Hemoglobin: 12.3 g/dL — ABNORMAL LOW (ref 13.0–17.0)
LYMPHS PCT: 13 % (ref 12–46)
Lymphs Abs: 0.8 10*3/uL (ref 0.7–4.0)
MCH: 31.1 pg (ref 26.0–34.0)
MCHC: 34.8 g/dL (ref 30.0–36.0)
MCV: 89.4 fL (ref 78.0–100.0)
MONOS PCT: 6 % (ref 3–12)
Monocytes Absolute: 0.4 10*3/uL (ref 0.1–1.0)
Neutro Abs: 4.5 10*3/uL (ref 1.7–7.7)
Neutrophils Relative %: 75 % (ref 43–77)
Platelets: 83 10*3/uL — ABNORMAL LOW (ref 150–400)
RBC: 3.95 MIL/uL — AB (ref 4.22–5.81)
RDW: 14.1 % (ref 11.5–15.5)
WBC: 6.1 10*3/uL (ref 4.0–10.5)

## 2015-01-11 LAB — CBG MONITORING, ED
GLUCOSE-CAPILLARY: 89 mg/dL (ref 70–99)
GLUCOSE-CAPILLARY: 47 mg/dL — AB (ref 70–99)
GLUCOSE-CAPILLARY: 133 mg/dL — AB (ref 70–99)
Glucose-Capillary: 86 mg/dL (ref 70–99)

## 2015-01-11 MED ORDER — DEXTROSE-NACL 5-0.45 % IV SOLN
INTRAVENOUS | Status: DC
  Administered 2015-01-11: 23:00:00 via INTRAVENOUS

## 2015-01-11 MED ORDER — DEXTROSE 50 % IV SOLN
INTRAVENOUS | Status: AC
  Administered 2015-01-11: 50 mL
  Filled 2015-01-11: qty 50

## 2015-01-11 NOTE — ED Notes (Signed)
Notified RN,[name] Brown pt. CBG 86.

## 2015-01-11 NOTE — H&P (Addendum)
History and Physical  [name] Brown [id number] DOB: 12-06-1935 DOA: 01/11/2015   PCP: Laurey Morale, MD   Chief Complaint: hypoglycemia  HPI:  79 year old male with a history of hypertension, atrial fibrillation, renal transplant, diabetes mellitus, CKD stage III, aortic valve stenosis status post tissue valve replacement presented after an accidental dose of his NovoLog. The patient was in a hurry.   During a commercial on television, he rushed to take his insulin for the evening, and immediately after injecting himself, he noticed that he used the wrong insulin pen. Instead of injecting himself with his 30 units of Lantus, he injected himself with 30 units of NovoLog. This occurred approximately at 8:15 PM. The patient did have some blurry vision and sweats. He came to the emergency department. The patient was given food, but he continued to have hypoglycemia. As result observation admission was advised.  Patient denies fevers, chills, headache, chest pain, dyspnea, nausea, vomiting, diarrhea, abdominal pain, dysuria, hematuria, hematochezia, melena.  In the emergency department, serum creatinine was 1.92, WBC 6.1, hemoglobin 12.3, platelets 83,000. Hepatic enzymes were unremarkable. EKG shows sinus rhythm with interventricular conduction delay and left anterior fascicular block with nonspecific T-wave changes. He was hemodynamically stable and afebrile.  Assessment/Plan: Hypoglycemia -Due to accidental use of NovoLog instead of Lantus -Because of the patient's CKD, his insulin will likely have a longer half-life -The patient remained hypoglycemic despite eating in the emergency department -Start the patient on D5 normal saline -CBGs every hour -Discontinue insulin and metformin Acute on chronic renal failure -The patient states that his baseline creatinine is 1.3-1.5 -He states that on 01/05/2015, his serum creatinine was 1.53 -He follows Dr. Lorrene Reid -Repeat BMP after  hydration -If no improvement, would be concerned about possible rejection and may need nephrology consultation -Start D5 normal saline as discussed -Certainly this may be partly pseudo elevation due to the patient's Bactrim as it inhibits tubular secretion -Hold furosemide Diabetes mellitus type 2 -Hold metformin especially in the setting of worsening renal failure -Concerned about restarting metformin after discharge given the patient's estimated creatinine clearance -Hold insulin for now given hypoglycemia Aortic stenosis status post tissue valve replacement -Hemodynamically stable without any chest discomfort or shortness of breath Status post renal transplant -Continue Prograf--patient states that he takes 3 mg in the morning, 2 mg in the evening -Continue Myfortic 180 mg twice a day -Continue Bactrim, one tablet Monday, Wednesday, Friday  Hypertension  -Continue carvedilol, amlodipine  History of stroke/MI -Continue statin -Continue aspirin Thrombocytopenia -chronic due to splenomegaly -SCDs   Past Medical History  Diagnosis Date  . CAD (coronary artery disease)   . Hypertension   . Hyperlipidemia   . Myocardial infarction   . Diabetes mellitus   . Gout   . Obesity   . Chronic kidney disease     chronic renal failure  . Atrial fibrillation   . CVA (cerebral infarction) 05/2010  . Thrombocytopenia 2011    sees Dr. Lamonte Sakai  . Splenomegaly 2012  . Anemia associated with chronic renal failure   . Aortic stenosis   . Anemia 01/02/2012  . Thrombocytopenia    Past Surgical History  Procedure Laterality Date  . Appendectomy    . Coronary artery bypass graft      June 2 011 Limited the LAD, SVG to OM, SVG to PDA  . Bone spurs      from right heel  . Aortic valve replacement      Pericardial tissue valve  .  Kidney transplant  01-20-12    at [place] History:  reports that he has never smoked. He has never used smokeless tobacco. He reports that he does  not drink alcohol or use illicit drugs.   Family History  Problem Relation Age of Onset  . Liver disease Father   . Cancer      colon/fhx  . Hypertension      fhx  . Coronary artery disease      fhx     Allergies  Allergen Reactions  . Penicillins     REACTION: hives      Prior to Admission medications   Medication Sig Start Date End Date Taking? Authorizing Provider  allopurinol (ZYLOPRIM) 100 MG tablet TAKE 1 TABLET BY MOUTH EVERY DAY AS DIRECTED 12/05/14  Yes Laurey Morale, MD  ALPRAZolam Duanne Moron) 0.5 MG tablet Take 1 tablet (0.5 mg total) by mouth every 6 (six) hours as needed for anxiety. 09/20/13  Yes Laurey Morale, MD  amLODipine (NORVASC) 5 MG tablet Take 1 tablet (5 mg total) by mouth daily. Patient taking differently: Take 2.5 mg by mouth daily.  07/30/12  Yes Laurey Morale, MD  aspirin 81 MG tablet Take 81 mg by mouth daily.   Yes Historical Provider, MD  calcium carbonate (TUMS - DOSED IN MG ELEMENTAL CALCIUM) 500 MG chewable tablet Chew 1 tablet by mouth daily.    Yes Historical Provider, MD  carvedilol (COREG) 12.5 MG tablet Take 1 tablet by mouth 2 (two) times daily. 10/08/14  Yes Historical Provider, MD  colchicine 0.6 MG tablet Take 0.6 mg by mouth daily as needed (gout).    Yes Historical Provider, MD  Docusate Sodium (COLACE PO) Take 1 capsule by mouth 2 (two) times daily.    Yes Historical Provider, MD  furosemide (LASIX) 40 MG tablet Take 40 mg by mouth daily.    Yes Historical Provider, MD  gabapentin (NEURONTIN) 100 MG capsule TAKE ONE CAPSULE 3 TIMES A DAY Patient taking differently: take 1 capsule every other day   Yes Laurey Morale, MD  HYDROcodone-homatropine (HYDROMET) 5-1.5 MG/5ML syrup Take 5 mLs by mouth every 4 (four) hours as needed. 12/20/14  Yes Laurey Morale, MD  Insulin Glargine (LANTUS SOLOSTAR) 100 UNIT/ML Solostar Pen 30 units at bedtime 10/31/13  Yes Laurey Morale, MD  insulin lispro (HUMALOG) 100 UNIT/ML injection Sliding scale 07/30/12  Yes  Laurey Morale, MD  Krill Oil 1000 MG CAPS Take 1,000 mg by mouth daily.   Yes Historical Provider, MD  lactulose (CHRONULAC) 10 GM/15ML solution Take 10 g by mouth as needed for mild constipation.  06/30/12  Yes Historical Provider, MD  levothyroxine (SYNTHROID, LEVOTHROID) 25 MCG tablet Take 25 mcg by mouth daily.  01/07/11  Yes Historical Provider, MD  metFORMIN (GLUCOPHAGE) 1000 MG tablet Take 1 tablet (1,000 mg total) by mouth 2 (two) times daily with a meal. 07/20/14  Yes Laurey Morale, MD  mycophenolate (MYFORTIC) 180 MG EC tablet Take 1 tablet (180 mg total) by mouth 2 (two) times daily. 07/30/12  Yes Laurey Morale, MD  omeprazole (PRILOSEC) 20 MG capsule Take 20 mg by mouth daily.  07/21/12  Yes Historical Provider, MD  oxyCODONE (OXY IR/ROXICODONE) 5 MG immediate release tablet Take 1 tablet (5 mg total) by mouth every 6 (six) hours as needed for moderate pain. 09/13/14  Yes Laurey Morale, MD  simvastatin (ZOCOR) 10 MG tablet Take 1 tablet (10 mg  total) by mouth at bedtime. 08/20/13  Yes Laurey Morale, MD  sulfamethoxazole-trimethoprim (BACTRIM,SEPTRA) 400-80 MG per tablet Take 1 tablet by mouth as directed. Monday Wednesday and friday   Yes Historical Provider, MD  tacrolimus (PROGRAF) 1 MG capsule Take 3 capsules (3 mg total) by mouth 2 (two) times daily. Patient taking differently: Take 1 mg by mouth 2 (two) times daily. Take 3 in AM and 2 in PM 07/30/12  Yes Laurey Morale, MD  temazepam (RESTORIL) 30 MG capsule Take 1 capsule (30 mg total) by mouth at bedtime as needed. Patient taking differently: Take 30 mg by mouth at bedtime as needed for sleep.  12/20/14  Yes Laurey Morale, MD  traMADol (ULTRAM) 50 MG tablet TAKE 2 TABLETS (100 MG TOTAL) BY MOUTH EVERY 6 HOURS AS NEEDED FOR MODERATE PAIN 10/10/14  Yes Laurey Morale, MD  B-D ULTRAFINE III SHORT PEN 31G X 8 MM MISC USE TO TEST TWICE A DAY 05/17/13   Laurey Morale, MD  B-D ULTRAFINE III SHORT PEN 31G X 8 MM MISC USE AS DIRECTED 11/26/14    Laurey Morale, MD  cefUROXime (CEFTIN) 500 MG tablet Take 1 tablet (500 mg total) by mouth 2 (two) times daily with a meal. Patient not taking: Reported on 01/11/2015 12/09/14   Laurey Morale, MD  glucose blood (ONE TOUCH ULTRA TEST) test strip USE TO TEST BLOOD SUGAR 3 TIMES A DAY and diagnosis code is E 11.9 12/26/14   Laurey Morale, MD    Review of Systems:  Constitutional:  No weight loss, night sweats, Fevers, chills, fatigue.  Head&Eyes: No headache.  No vision loss.  No eye pain or scotoma ENT:  No Difficulty swallowing,Tooth/dental problems,Sore throat,   Cardio-vascular:  No chest pain, Orthopnea, PND, swelling in lower extremities,  dizziness, palpitations  GI:  No  abdominal pain, nausea, vomiting, diarrhea, loss of appetite, hematochezia, melena, heartburn, indigestion, Resp:  No shortness of breath with exertion or at rest. No cough. No coughing up of blood .No wheezing.No chest wall deformity  Skin:  no rash or lesions.  GU:  no dysuria, change in color of urine, no urgency or frequency. No flank pain.  Musculoskeletal:  No joint pain or swelling. No decreased range of motion. No back pain.  Psych:  No change in mood or affect. No depression or anxiety. Neurologic: No headache, no dysesthesia, no focal weakness, no vision loss. No syncope  Physical Exam: Filed Vitals:   01/11/15 2112 01/11/15 2154 01/11/15 2227 01/11/15 2310  BP: 167/61 167/61 165/63 137/47  Pulse: 87 81 80 83  Temp: 98 F (36.7 C)     TempSrc: Oral     Resp: 22 15 16 20   SpO2: 98% 99% 99% 97%   General:  A&O x 3, NAD, nontoxic, pleasant/cooperative Head/Eye: No conjunctival hemorrhage, no icterus, [place]/AT, No nystagmus ENT:  No icterus,  No thrush, poor dentition, no pharyngeal exudate Neck:  No masses, no lymphadenpathy, no bruits CV:  RRR, no rub, no gallop, no S3 Lung:  CTAB, good air movement, no wheeze, no rhonchi Abdomen: soft/NT, +BS, nondistended, no peritoneal signs Ext: No cyanosis,  No rashes, No petechiae, No lymphangitis, No edema   Labs on Admission:  Basic Metabolic Panel:  Recent Labs Lab 01/11/15 2215  NA 140  K 4.1  CL 105  CO2 26  GLUCOSE 102*  BUN 25*  CREATININE 1.92*  CALCIUM 9.1   Liver Function Tests:  Recent Labs Lab  01/11/15 2215  AST 15  ALT 11  ALKPHOS 69  BILITOT 0.5  PROT 8.0  ALBUMIN 4.7   No results for input(s): LIPASE, AMYLASE in the last 168 hours. No results for input(s): AMMONIA in the last 168 hours. CBC:  Recent Labs Lab 01/11/15 2215  WBC 6.1  NEUTROABS 4.5  HGB 12.3*  HCT 35.3*  MCV 89.4  PLT 83*   Cardiac Enzymes: No results for input(s): CKTOTAL, CKMB, CKMBINDEX, TROPONINI in the last 168 hours. BNP: Invalid input(s): POCBNP CBG:  Recent Labs Lab 01/11/15 2114 01/11/15 2202 01/11/15 2259  GLUCAP 89 86 47*    Radiological Exams on Admission: No results found.  EKG: Independently reviewed. Sinus, LAFB, IVCD--essentially unchanged    Time spent:50 minutes Code Status:   FULL Family Communication:   No Family at bedside   Tallyn Holroyd, DO  [facility] Pager [phone]  If 7PM-7AM, please contact night-coverage [email or web address] Password [facility] 01/11/2015, 11:37 PM

## 2015-01-11 NOTE — ED Notes (Signed)
Pt reports taking 30 Units of Insulin Humalog around 2000 hrs tonight, also states that at 1800 hrs he had taken 3 units together with his dinner, AOx4 at this time, although pt is anxious, there are no obvious signs of acute hypoglycemia at this time. CBG at this note is 89.

## 2015-01-11 NOTE — ED Provider Notes (Addendum)
CSN: [id number]     Arrival date & time 01/11/15  2102 History   First MD Initiated Contact with Patient 01/11/15 2141     Chief Complaint  Patient presents with  . Insulin Overdose      (Consider location/radiation/quality/duration/timing/severity/associated sxs/prior Treatment) HPI Comments: Patient here after taking an accidental overdose of insulin. Patient normally takes 30 units of Lantus prior to bed but accidentally took 30 units of Humalog prior to arrival. Denies any weakness or diaphoresis. Feels at his baseline. No treatment use prior to arrival.  The history is provided by the patient.    Past Medical History  Diagnosis Date  . CAD (coronary artery disease)   . Hypertension   . Hyperlipidemia   . Myocardial infarction   . Diabetes mellitus   . Gout   . Obesity   . Chronic kidney disease     chronic renal failure  . Atrial fibrillation   . CVA (cerebral infarction) 05/2010  . Thrombocytopenia 2011    sees Dr. Lamonte Sakai  . Splenomegaly 2012  . Anemia associated with chronic renal failure   . Aortic stenosis   . Anemia 01/02/2012  . Thrombocytopenia    Past Surgical History  Procedure Laterality Date  . Appendectomy    . Coronary artery bypass graft      June 2 011 Limited the LAD, SVG to OM, SVG to PDA  . Bone spurs      from right heel  . Aortic valve replacement      Pericardial tissue valve  . Kidney transplant  01-20-12    at [place] History  Problem Relation Age of Onset  . Liver disease Father   . Cancer      colon/fhx  . Hypertension      fhx  . Coronary artery disease      fhx   History  Substance Use Topics  . Smoking status: Never Smoker   . Smokeless tobacco: Never Used  . Alcohol Use: No    Review of Systems  All other systems reviewed and are negative.     Allergies  Penicillins  Home Medications   Prior to Admission medications   Medication Sig Start Date End Date Taking? Authorizing Provider  allopurinol  (ZYLOPRIM) 100 MG tablet TAKE 1 TABLET BY MOUTH EVERY DAY AS DIRECTED 12/05/14  Yes Laurey Morale, MD  ALPRAZolam Duanne Moron) 0.5 MG tablet Take 1 tablet (0.5 mg total) by mouth every 6 (six) hours as needed for anxiety. 09/20/13  Yes Laurey Morale, MD  amLODipine (NORVASC) 5 MG tablet Take 1 tablet (5 mg total) by mouth daily. Patient taking differently: Take 2.5 mg by mouth daily.  07/30/12  Yes Laurey Morale, MD  aspirin 81 MG tablet Take 81 mg by mouth daily.   Yes Historical Provider, MD  calcium carbonate (TUMS - DOSED IN MG ELEMENTAL CALCIUM) 500 MG chewable tablet Chew 1 tablet by mouth daily.    Yes Historical Provider, MD  carvedilol (COREG) 12.5 MG tablet Take 1 tablet by mouth 2 (two) times daily. 10/08/14  Yes Historical Provider, MD  colchicine 0.6 MG tablet Take 0.6 mg by mouth daily as needed (gout).    Yes Historical Provider, MD  Docusate Sodium (COLACE PO) Take 1 capsule by mouth 2 (two) times daily.    Yes Historical Provider, MD  furosemide (LASIX) 40 MG tablet Take 40 mg by mouth daily.    Yes Historical Provider, MD  gabapentin (  NEURONTIN) 100 MG capsule TAKE ONE CAPSULE 3 TIMES A DAY Patient taking differently: take 1 capsule every other day   Yes Laurey Morale, MD  HYDROcodone-homatropine (HYDROMET) 5-1.5 MG/5ML syrup Take 5 mLs by mouth every 4 (four) hours as needed. 12/20/14  Yes Laurey Morale, MD  Insulin Glargine (LANTUS SOLOSTAR) 100 UNIT/ML Solostar Pen 30 units at bedtime 10/31/13  Yes Laurey Morale, MD  insulin lispro (HUMALOG) 100 UNIT/ML injection Sliding scale 07/30/12  Yes Laurey Morale, MD  Krill Oil 1000 MG CAPS Take 1,000 mg by mouth daily.   Yes Historical Provider, MD  lactulose (CHRONULAC) 10 GM/15ML solution Take 10 g by mouth as needed for mild constipation.  06/30/12  Yes Historical Provider, MD  levothyroxine (SYNTHROID, LEVOTHROID) 25 MCG tablet Take 25 mcg by mouth daily.  01/07/11  Yes Historical Provider, MD  metFORMIN (GLUCOPHAGE) 1000 MG tablet Take 1  tablet (1,000 mg total) by mouth 2 (two) times daily with a meal. 07/20/14  Yes Laurey Morale, MD  mycophenolate (MYFORTIC) 180 MG EC tablet Take 1 tablet (180 mg total) by mouth 2 (two) times daily. 07/30/12  Yes Laurey Morale, MD  omeprazole (PRILOSEC) 20 MG capsule Take 20 mg by mouth daily.  07/21/12  Yes Historical Provider, MD  oxyCODONE (OXY IR/ROXICODONE) 5 MG immediate release tablet Take 1 tablet (5 mg total) by mouth every 6 (six) hours as needed for moderate pain. 09/13/14  Yes Laurey Morale, MD  simvastatin (ZOCOR) 10 MG tablet Take 1 tablet (10 mg total) by mouth at bedtime. 08/20/13  Yes Laurey Morale, MD  sulfamethoxazole-trimethoprim (BACTRIM,SEPTRA) 400-80 MG per tablet Take 1 tablet by mouth as directed. Monday Wednesday and friday   Yes Historical Provider, MD  tacrolimus (PROGRAF) 1 MG capsule Take 3 capsules (3 mg total) by mouth 2 (two) times daily. Patient taking differently: Take 1 mg by mouth 2 (two) times daily. Take 3 in AM and 2 in PM 07/30/12  Yes Laurey Morale, MD  temazepam (RESTORIL) 30 MG capsule Take 1 capsule (30 mg total) by mouth at bedtime as needed. Patient taking differently: Take 30 mg by mouth at bedtime as needed for sleep.  12/20/14  Yes Laurey Morale, MD  traMADol (ULTRAM) 50 MG tablet TAKE 2 TABLETS (100 MG TOTAL) BY MOUTH EVERY 6 HOURS AS NEEDED FOR MODERATE PAIN 10/10/14  Yes Laurey Morale, MD  B-D ULTRAFINE III SHORT PEN 31G X 8 MM MISC USE TO TEST TWICE A DAY 05/17/13   Laurey Morale, MD  B-D ULTRAFINE III SHORT PEN 31G X 8 MM MISC USE AS DIRECTED 11/26/14   Laurey Morale, MD  cefUROXime (CEFTIN) 500 MG tablet Take 1 tablet (500 mg total) by mouth 2 (two) times daily with a meal. Patient not taking: Reported on 01/11/2015 12/09/14   Laurey Morale, MD  glucose blood (ONE TOUCH ULTRA TEST) test strip USE TO TEST BLOOD SUGAR 3 TIMES A DAY and diagnosis code is E 11.9 12/26/14   Laurey Morale, MD   BP 167/61 mmHg  Pulse 81  Temp(Src) 98 F (36.7 C) (Oral)   Resp 15  SpO2 99% Physical Exam  Constitutional: He is oriented to person, place, and time. He appears well-developed and well-nourished.  Non-toxic appearance. No distress.  HENT:  Head: Normocephalic and atraumatic.  Eyes: Conjunctivae, EOM and lids are normal. Pupils are equal, round, and reactive to light.  Neck: Normal range of motion. Neck  supple. No tracheal deviation present. No thyroid mass present.  Cardiovascular: Normal rate, regular rhythm and normal heart sounds.  Exam reveals no gallop.   No murmur heard. Pulmonary/Chest: Effort normal and breath sounds normal. No stridor. No respiratory distress. He has no decreased breath sounds. He has no wheezes. He has no rhonchi. He has no rales.  Abdominal: Soft. Normal appearance and bowel sounds are normal. He exhibits no distension. There is no tenderness. There is no rebound and no CVA tenderness.  Musculoskeletal: Normal range of motion. He exhibits no edema or tenderness.  Neurological: He is alert and oriented to person, place, and time. He has normal strength. No cranial nerve deficit or sensory deficit. GCS eye subscore is 4. GCS verbal subscore is 5. GCS motor subscore is 6.  Skin: Skin is warm and dry. No abrasion and no rash noted.  Psychiatric: He has a normal mood and affect. His speech is normal and behavior is normal.  Nursing note and vitals reviewed.   ED Course  Procedures (including critical care time) Labs Review Labs Reviewed  CBC WITH DIFFERENTIAL/PLATELET  COMPREHENSIVE METABOLIC PANEL  CBG MONITORING, ED  CBG MONITORING, ED    Imaging Review No results found.   EKG Interpretation   Date/Time:  Wednesday January 11 2015 21:11:22 EDT Ventricular Rate:  85 PR Interval:  248 QRS Duration: 120 QT Interval:  420 QTC Calculation: 499 R Axis:   -63 Text Interpretation:  Sinus rhythm Prolonged PR interval Left anterior  fascicular block Anterior infarct, old Baseline wander in lead(s) V4  Confirmed by  ZACKOWSKI  MD, SCOTT [phone]) on 01/11/2015 9:15:48 PM      MDM   Final diagnoses:  None    Patient is to have serial blood glucose monitoring every hour for 6 hours and if blood sugar is stable he will be discharged home. Care will be signed out to the oncoming provider  11:00 PM Patient had decrease in her blood sugar and will be given D5 and placed on a D5 drip and admitted to the medicine service for observation   Lacretia Leigh, MD 01/11/15 2203  Lacretia Leigh, MD 01/11/15 [phone]

## 2015-01-12 DIAGNOSIS — E162 Hypoglycemia, unspecified: Secondary | ICD-10-CM | POA: Diagnosis not present

## 2015-01-12 DIAGNOSIS — Z940 Kidney transplant status: Secondary | ICD-10-CM | POA: Diagnosis not present

## 2015-01-12 DIAGNOSIS — N179 Acute kidney failure, unspecified: Secondary | ICD-10-CM | POA: Diagnosis not present

## 2015-01-12 DIAGNOSIS — T383X1A Poisoning by insulin and oral hypoglycemic [antidiabetic] drugs, accidental (unintentional), initial encounter: Secondary | ICD-10-CM | POA: Diagnosis not present

## 2015-01-12 DIAGNOSIS — N189 Chronic kidney disease, unspecified: Secondary | ICD-10-CM | POA: Diagnosis not present

## 2015-01-12 LAB — URINALYSIS, ROUTINE W REFLEX MICROSCOPIC
BILIRUBIN URINE: NEGATIVE
GLUCOSE, UA: NEGATIVE mg/dL
Hgb urine dipstick: NEGATIVE
Ketones, ur: NEGATIVE mg/dL
Leukocytes, UA: NEGATIVE
NITRITE: NEGATIVE
PH: 6 (ref 5.0–8.0)
PROTEIN: NEGATIVE mg/dL
SPECIFIC GRAVITY, URINE: 1.018 (ref 1.005–1.030)
UROBILINOGEN UA: 0.2 mg/dL (ref 0.0–1.0)

## 2015-01-12 LAB — GLUCOSE, CAPILLARY
GLUCOSE-CAPILLARY: 128 mg/dL — AB (ref 70–99)
GLUCOSE-CAPILLARY: 88 mg/dL (ref 70–99)
GLUCOSE-CAPILLARY: 89 mg/dL (ref 70–99)
Glucose-Capillary: 74 mg/dL (ref 70–99)
Glucose-Capillary: 91 mg/dL (ref 70–99)
Glucose-Capillary: 110 mg/dL — ABNORMAL HIGH (ref 70–99)
Glucose-Capillary: 115 mg/dL — ABNORMAL HIGH (ref 70–99)
Glucose-Capillary: 121 mg/dL — ABNORMAL HIGH (ref 70–99)
Glucose-Capillary: 115 mg/dL — ABNORMAL HIGH (ref 70–99)

## 2015-01-12 LAB — BASIC METABOLIC PANEL WITH GFR
Anion gap: 10 (ref 5–15)
BUN: 24 mg/dL — ABNORMAL HIGH (ref 6–23)
CO2: 22 mmol/L (ref 19–32)
Calcium: 8.3 mg/dL — ABNORMAL LOW (ref 8.4–10.5)
Chloride: 106 mmol/L (ref 96–112)
Creatinine, Ser: 1.62 mg/dL — ABNORMAL HIGH (ref 0.50–1.35)
GFR calc Af Amer: 46 mL/min — ABNORMAL LOW
GFR calc non Af Amer: 40 mL/min — ABNORMAL LOW
Glucose, Bld: 115 mg/dL — ABNORMAL HIGH (ref 70–99)
Potassium: 4.3 mmol/L (ref 3.5–5.1)
Sodium: 138 mmol/L (ref 135–145)

## 2015-01-12 LAB — CBC
HCT: 30.2 % — ABNORMAL LOW (ref 39.0–52.0)
Hemoglobin: 10.2 g/dL — ABNORMAL LOW (ref 13.0–17.0)
MCH: 30 pg (ref 26.0–34.0)
MCHC: 33.8 g/dL (ref 30.0–36.0)
MCV: 88.8 fL (ref 78.0–100.0)
Platelets: 64 K/uL — ABNORMAL LOW (ref 150–400)
RBC: 3.4 MIL/uL — ABNORMAL LOW (ref 4.22–5.81)
RDW: 14.2 % (ref 11.5–15.5)
WBC: 5.5 K/uL (ref 4.0–10.5)

## 2015-01-12 MED ORDER — DEXTROSE-NACL 5-0.9 % IV SOLN
INTRAVENOUS | Status: DC
  Administered 2015-01-12: 01:00:00 via INTRAVENOUS

## 2015-01-12 MED ORDER — OXYCODONE HCL 5 MG PO TABS
5.0000 mg | ORAL_TABLET | Freq: Four times a day (QID) | ORAL | Status: DC | PRN
  Administered 2015-01-12: 5 mg via ORAL
  Filled 2015-01-12 (×2): qty 1

## 2015-01-12 MED ORDER — ONDANSETRON HCL 4 MG/2ML IJ SOLN: 4.0000 mg | Freq: Four times a day (QID) | INTRAMUSCULAR | Status: DC | PRN

## 2015-01-12 MED ORDER — TACROLIMUS 1 MG PO CAPS
3.0000 mg | ORAL_CAPSULE | Freq: Every day | ORAL | Status: DC
  Administered 2015-01-12: 3 mg via ORAL
  Filled 2015-01-12: qty 3

## 2015-01-12 MED ORDER — KRILL OIL 1000 MG PO CAPS
1000.0000 mg | ORAL_CAPSULE | Freq: Every day | ORAL | Status: DC
Start: 2015-01-12 — End: 2015-01-12

## 2015-01-12 MED ORDER — SULFAMETHOXAZOLE-TRIMETHOPRIM 400-80 MG PO TABS: 1.0000 | ORAL_TABLET | ORAL | Status: DC

## 2015-01-12 MED ORDER — TEMAZEPAM 15 MG PO CAPS
30.0000 mg | ORAL_CAPSULE | Freq: Every evening | ORAL | Status: DC | PRN
  Administered 2015-01-12: 30 mg via ORAL
  Filled 2015-01-12: qty 2

## 2015-01-12 MED ORDER — AMLODIPINE BESYLATE 2.5 MG PO TABS
2.5000 mg | ORAL_TABLET | Freq: Every day | ORAL | Status: DC
  Administered 2015-01-12: 2.5 mg via ORAL
  Filled 2015-01-12: qty 1

## 2015-01-12 MED ORDER — ONDANSETRON HCL 4 MG PO TABS: 4.0000 mg | ORAL_TABLET | Freq: Four times a day (QID) | ORAL | Status: DC | PRN

## 2015-01-12 MED ORDER — OMEPRAZOLE 20 MG PO CPDR
20.0000 mg | DELAYED_RELEASE_CAPSULE | Freq: Every day | ORAL | Status: DC
  Administered 2015-01-12: 20 mg via ORAL
  Filled 2015-01-12: qty 1

## 2015-01-12 MED ORDER — TRAMADOL HCL 50 MG PO TABS
50.0000 mg | ORAL_TABLET | Freq: Four times a day (QID) | ORAL | Status: DC | PRN
  Administered 2015-01-12 (×2): 50 mg via ORAL
  Filled 2015-01-12 (×2): qty 1

## 2015-01-12 MED ORDER — CARVEDILOL 12.5 MG PO TABS
12.5000 mg | ORAL_TABLET | Freq: Two times a day (BID) | ORAL | Status: DC
  Administered 2015-01-12: 12.5 mg via ORAL
  Filled 2015-01-12 (×3): qty 1

## 2015-01-12 MED ORDER — ALLOPURINOL 100 MG PO TABS
100.0000 mg | ORAL_TABLET | Freq: Every day | ORAL | Status: DC
  Administered 2015-01-12: 100 mg via ORAL
  Filled 2015-01-12: qty 1

## 2015-01-12 MED ORDER — TACROLIMUS 1 MG PO CAPS
2.0000 mg | ORAL_CAPSULE | Freq: Every day | ORAL | Status: DC
Start: 2015-01-12 — End: 2015-01-12
  Filled 2015-01-12: qty 2

## 2015-01-12 MED ORDER — SIMVASTATIN 10 MG PO TABS
10.0000 mg | ORAL_TABLET | Freq: Every day | ORAL | Status: DC
  Filled 2015-01-12 (×2): qty 1

## 2015-01-12 MED ORDER — DOCUSATE SODIUM 100 MG PO CAPS
100.0000 mg | ORAL_CAPSULE | Freq: Two times a day (BID) | ORAL | Status: DC
  Administered 2015-01-12: 100 mg via ORAL
  Filled 2015-01-12 (×2): qty 1

## 2015-01-12 MED ORDER — ACETAMINOPHEN 650 MG RE SUPP: 650.0000 mg | Freq: Four times a day (QID) | RECTAL | Status: DC | PRN

## 2015-01-12 MED ORDER — PANTOPRAZOLE SODIUM 40 MG PO TBEC: 40.0000 mg | DELAYED_RELEASE_TABLET | Freq: Every day | ORAL | Status: DC

## 2015-01-12 MED ORDER — LEVOTHYROXINE SODIUM 25 MCG PO TABS
25.0000 ug | ORAL_TABLET | Freq: Every day | ORAL | Status: DC
  Administered 2015-01-12: 25 ug via ORAL
  Filled 2015-01-12 (×2): qty 1

## 2015-01-12 MED ORDER — ACETAMINOPHEN 325 MG PO TABS
650.0000 mg | ORAL_TABLET | Freq: Four times a day (QID) | ORAL | Status: DC | PRN
Start: 2015-01-12 — End: 2015-01-12

## 2015-01-12 MED ORDER — CALCIUM CARBONATE ANTACID 500 MG PO CHEW
1.0000 | CHEWABLE_TABLET | Freq: Every day | ORAL | Status: DC
  Administered 2015-01-12: 200 mg via ORAL
  Filled 2015-01-12: qty 1

## 2015-01-12 MED ORDER — TACROLIMUS 1 MG PO CAPS
3.0000 mg | ORAL_CAPSULE | Freq: Two times a day (BID) | ORAL | Status: DC
  Filled 2015-01-12 (×3): qty 3

## 2015-01-12 MED ORDER — GABAPENTIN 100 MG PO CAPS: 100.0000 mg | ORAL_CAPSULE | ORAL | Status: DC

## 2015-01-12 MED ORDER — MYCOPHENOLATE SODIUM 180 MG PO TBEC
180.0000 mg | DELAYED_RELEASE_TABLET | Freq: Two times a day (BID) | ORAL | Status: DC
  Administered 2015-01-12: 180 mg via ORAL
  Filled 2015-01-12 (×3): qty 1

## 2015-01-12 MED ORDER — ASPIRIN 81 MG PO CHEW
81.0000 mg | CHEWABLE_TABLET | Freq: Every day | ORAL | Status: DC
  Administered 2015-01-12: 81 mg via ORAL
  Filled 2015-01-12: qty 1

## 2015-01-12 NOTE — Progress Notes (Signed)
PHARMACIST - PHYSICIAN ORDER COMMUNICATION  CONCERNING: P&T Medication Policy on Herbal Medications  DESCRIPTION:  This patient's order for:  [employer]  has been noted.  This product(s) is classified as an "herbal" or natural product. Due to a lack of definitive safety studies or FDA approval, nonstandard manufacturing practices, plus the potential risk of unknown drug-drug interactions while on inpatient medications, the Pharmacy and Therapeutics Committee does not permit the use of "herbal" or natural products of this type within [facility].   ACTION TAKEN: The pharmacy department is unable to verify this order at this time and your patient has been informed of this safety policy. Please reevaluate patient's clinical condition at discharge and address if the herbal or natural product(s) should be resumed at that time.   Dorrene German 01/12/2015 12:51 AM

## 2015-01-12 NOTE — Discharge Instructions (Signed)
Follow with [name] Brown,[name] A, MD in 5-7 days  Please get Brown complete blood count and chemistry panel checked by your Primary MD at your next visit, and again as instructed by your Primary MD. Please get your medications reviewed and adjusted by your Primary MD.  Please request your Primary MD to go over all Hospital Tests and Procedure/Radiological results at the follow up, please get all Hospital records sent to your Prim MD by signing hospital release before you go home.  If you had Pneumonia of Lung problems at the Hospital: Please get Brown 2 view Chest X ray done in 6-8 weeks after hospital discharge or sooner if instructed by your Primary MD.  If you have Congestive Heart Failure: Please call your Cardiologist or Primary MD anytime you have any of the following symptoms:  1) 3 pound weight gain in 24 hours or 5 pounds in 1 week  2) shortness of breath, with or without Brown dry hacking cough  3) swelling in the hands, feet or stomach  4) if you have to sleep on extra pillows at night in order to breathe  Follow cardiac low salt diet and 1.5 lit/day fluid restriction.  If you have diabetes Accuchecks 4 times/day, Once in AM empty stomach and then before each meal. Log in all results and show them to your primary doctor at your next visit. If any glucose reading is under 80 or above 300 call your primary MD immediately.  If you have Seizure/Convulsions/Epilepsy: Please do not drive, operate heavy machinery, participate in activities at heights or participate in high speed sports until you have seen by Primary MD or Brown Neurologist and advised to do so again.  If you had Gastrointestinal Bleeding: Please ask your Primary MD to check Brown complete blood count within one week of discharge or at your next visit. Your endoscopic/colonoscopic biopsies that are pending at the time of discharge, will also need to followed by your Primary MD.  Get Medicines reviewed and adjusted. Please take all your  medications with you for your next visit with your Primary MD  Please request your Primary MD to go over all hospital tests and procedure/radiological results at the follow up, please ask your Primary MD to get all Hospital records sent to his/her office.  If you experience worsening of your admission symptoms, develop shortness of breath, life threatening emergency, suicidal or homicidal thoughts you must seek medical attention immediately by calling 911 or calling your MD immediately  if symptoms less severe.  You must read complete instructions/literature along with all the possible adverse reactions/side effects for all the Medicines you take and that have been prescribed to you. Take any new Medicines after you have completely understood and accpet all the possible adverse reactions/side effects.   Do not drive or operate heavy machinery when taking Pain medications.   Do not take more than prescribed Pain, Sleep and Anxiety Medications  Special Instructions: If you have smoked or chewed Tobacco  in the last 2 yrs please stop smoking, stop any regular Alcohol  and or any Recreational drug use.  Wear Seat belts while driving.  Please note You were cared for by Brown hospitalist during your hospital stay. If you have any questions about your discharge medications or the care you received while you were in the hospital after you are discharged, you can call the unit and asked to speak with the hospitalist on call if the hospitalist that took care of you is not available. Once  you are discharged, your primary care physician will handle any further medical issues. Please note that NO REFILLS for any discharge medications will be authorized once you are discharged, as it is imperative that you return to your primary care physician (or establish Brown relationship with Brown primary care physician if you do not have one) for your aftercare needs so that they can reassess your need for medications and monitor your  lab values.  You can reach the hospitalist office at phone [phone] or fax [phone]   If you do not have Brown primary care physician, you can call [phone] for Brown physician referral.  Activity: As tolerated with Full fall precautions use walker/cane & assistance as needed  Diet: diabetic  Disposition Home

## 2015-01-12 NOTE — Discharge Summary (Signed)
Physician Discharge Summary  [name] Brown [id number] DOB: 07-02-1936 DOA: 01/11/2015  PCP: Laurey Morale, MD  Admit date: 01/11/2015 Discharge date: 01/12/2015  Time spent: 35 minutes  Recommendations for Outpatient Follow-up:  1. Follow up with Dr. Sarajane Jews in 1-2 weeks 2. Follow up with Dr. Lorrene Reid as previously arranged 3. Follow up with [facility] clinic at [facility] for regular follow up   Recommendations for primary care physician for things to follow:  Repeat BMP  Discharge Diagnoses:  Active Problems:   Coronary atherosclerosis   Thrombocytopenia   Anemia associated with chronic renal failure   H/O kidney transplant   Hypoglycemia   Acute on chronic renal failure  Discharge Condition: stable  Diet recommendation: diabetic  Filed Weights   01/12/15 0502  Weight: 105.824 kg (233 lb 4.8 oz)    History of present illness:  79 year old male with a history of hypertension, atrial fibrillation, renal transplant, diabetes mellitus, CKD stage III, aortic valve stenosis status post tissue valve replacement presented after an accidental dose of his NovoLog. The patient was in a hurry. During a commercial on television, he rushed to take his insulin for the evening, and immediately after injecting himself, he noticed that he used the wrong insulin pen. Instead of injecting himself with his 30 units of Lantus, he injected himself with 30 units of NovoLog. This occurred approximately at 8:15 PM. The patient did have some blurry vision and sweats. He came to the emergency department. The patient was given food, but he continued to have hypoglycemia. As result observation admission was advised. Patient denies fevers, chills, headache, chest pain, dyspnea, nausea, vomiting, diarrhea, abdominal pain, dysuria, hematuria, hematochezia, melena. In the emergency department, serum creatinine was 1.92, WBC 6.1, hemoglobin 12.3, platelets 83,000. Hepatic enzymes were unremarkable. EKG shows sinus  rhythm with interventricular conduction delay and left anterior fascicular block with nonspecific T-wave changes. He was hemodynamically stable and afebrile.  Hospital Course:  Hypoglycemia - this was due to accidental use of NovoLog instead of Lantus, he was admitted and placed on dextrose 5% infusion. His CBGs have remained controlled and in the morning his D5 gtt was discontinued. Patient tolerated breakfast and luch well without problems and without further hypoglycemic events. He was advised to resume his home insulin regimen, monitor his CBGs and follow up with Dr. Sarajane Jews in 1-2 weeks.  Acute on chronic renal failure - baseline creatinine is 1.3-1.5, mild AKI on admission improved with fluids, Creatinine at baseline on d/c, has good outpatient follow up with Dr. Lorrene Reid, Dr. Sarajane Jews and transplant clinic at [facility].  Diabetes mellitus type 2 - good control as an outpatient  Aortic stenosis status post tissue valve replacement - Hemodynamically stable without any chest discomfort or shortness of breath Status post renal transplant - Continue Prograf, Myfortic and Bactrim ppx.  Hypertension - Continue carvedilol, amlodipine  History of stroke/MI - continue statin, aspirin  Thrombocytopenia -chronic due to splenomegaly  Procedures:  None    Consultations:  None   Discharge Exam: Filed Vitals:   01/12/15 0022 01/12/15 0100 01/12/15 0502 01/12/15 1417  BP: 161/66  124/57 146/62  Pulse: 77  72 79  Temp: 98.5 F (36.9 C)  98.4 F (36.9 C) 98.9 F (37.2 C)  TempSrc: Oral  Oral Oral  Resp: 18  18 20   Height:  6' (1.829 m)    Weight:   105.824 kg (233 lb 4.8 oz)   SpO2: 100%  97% 99%   General: NAD Cardiovascular: RRR  Respiratory: CTA  biL, no wheezing  Discharge Instructions    Medication List    STOP taking these medications        cefUROXime 500 MG tablet  Commonly known as:  CEFTIN      TAKE these medications        allopurinol 100 MG tablet  Commonly known as:  ZYLOPRIM    TAKE 1 TABLET BY MOUTH EVERY DAY AS DIRECTED     ALPRAZolam 0.5 MG tablet  Commonly known as:  XANAX  Take 1 tablet (0.5 mg total) by mouth every 6 (six) hours as needed for anxiety.     amLODipine 5 MG tablet  Commonly known as:  NORVASC  Take 1 tablet (5 mg total) by mouth daily.     aspirin 81 MG tablet  Take 81 mg by mouth daily.     B-D ULTRAFINE III SHORT PEN 31G X 8 MM Misc  Generic drug:  Insulin Pen Needle  USE TO TEST TWICE A DAY     B-D ULTRAFINE III SHORT PEN 31G X 8 MM Misc  Generic drug:  Insulin Pen Needle  USE AS DIRECTED     calcium carbonate 500 MG chewable tablet  Commonly known as:  TUMS - dosed in mg elemental calcium  Chew 1 tablet by mouth daily.     carvedilol 12.5 MG tablet  Commonly known as:  COREG  Take 1 tablet by mouth 2 (two) times daily.     COLACE PO  Take 1 capsule by mouth 2 (two) times daily.     colchicine 0.6 MG tablet  Take 0.6 mg by mouth daily as needed (gout).     furosemide 40 MG tablet  Commonly known as:  LASIX  Take 40 mg by mouth daily.     gabapentin 100 MG capsule  Commonly known as:  NEURONTIN  TAKE ONE CAPSULE 3 TIMES A DAY     glucose blood test strip  Commonly known as:  ONE TOUCH ULTRA TEST  USE TO TEST BLOOD SUGAR 3 TIMES A DAY and diagnosis code is E 11.9     HYDROcodone-homatropine 5-1.5 MG/5ML syrup  Commonly known as:  HYDROMET  Take 5 mLs by mouth every 4 (four) hours as needed.     insulin lispro 100 UNIT/ML injection  Commonly known as:  HUMALOG  Sliding scale     Krill Oil 1000 MG Caps  Take 1,000 mg by mouth daily.     lactulose 10 GM/15ML solution  Commonly known as:  CHRONULAC  Take 10 g by mouth as needed for mild constipation.     LANTUS SOLOSTAR 100 UNIT/ML Solostar Pen  Generic drug:  Insulin Glargine  30 units at bedtime     levothyroxine 25 MCG tablet  Commonly known as:  SYNTHROID, LEVOTHROID  Take 25 mcg by mouth daily.     metFORMIN 1000 MG tablet  Commonly known as:   GLUCOPHAGE  Take 1 tablet (1,000 mg total) by mouth 2 (two) times daily with a meal.     mycophenolate 180 MG EC tablet  Commonly known as:  MYFORTIC  Take 1 tablet (180 mg total) by mouth 2 (two) times daily.     omeprazole 20 MG capsule  Commonly known as:  PRILOSEC  Take 20 mg by mouth daily.     oxyCODONE 5 MG immediate release tablet  Commonly known as:  Oxy IR/ROXICODONE  Take 1 tablet (5 mg total) by mouth every 6 (six) hours as needed for moderate  pain.     simvastatin 10 MG tablet  Commonly known as:  ZOCOR  Take 1 tablet (10 mg total) by mouth at bedtime.     sulfamethoxazole-trimethoprim 400-80 MG per tablet  Commonly known as:  BACTRIM,SEPTRA  Take 1 tablet by mouth as directed. Monday Wednesday and friday     tacrolimus 1 MG capsule  Commonly known as:  PROGRAF  Take 3 capsules (3 mg total) by mouth 2 (two) times daily.     temazepam 30 MG capsule  Commonly known as:  RESTORIL  Take 1 capsule (30 mg total) by mouth at bedtime as needed.     traMADol 50 MG tablet  Commonly known as:  ULTRAM  TAKE 2 TABLETS (100 MG TOTAL) BY MOUTH EVERY 6 HOURS AS NEEDED FOR MODERATE PAIN           Follow-up Information    Follow up with Alysia Penna A, MD. Schedule an appointment as soon as possible for a visit on 01/24/2015.   Specialty:  Family Medicine   Why:  Please follow up with Dr. Sarajane Jews on April 5th at 3:15pm.   Contact information:   [address] [place] [address] [phone]       The results of significant diagnostics from this hospitalization (including imaging, microbiology, ancillary and laboratory) are listed below for reference.    Labs: Basic Metabolic Panel:  Recent Labs Lab 01/11/15 2215 01/12/15 0530  NA 140 138  K 4.1 4.3  CL 105 106  CO2 26 22  GLUCOSE 102* 115*  BUN 25* 24*  CREATININE 1.92* 1.62*  CALCIUM 9.1 8.3*   Liver Function Tests:  Recent Labs Lab 01/11/15 2215  AST 15  ALT 11  ALKPHOS 69  BILITOT 0.5    PROT 8.0  ALBUMIN 4.7   CBC:  Recent Labs Lab 01/11/15 2215 01/12/15 0530  WBC 6.1 5.5  NEUTROABS 4.5  --   HGB 12.3* 10.2*  HCT 35.3* 30.2*  MCV 89.4 88.8  PLT 83* 64*   CBG:  Recent Labs Lab 01/12/15 0501 01/12/15 0604 01/12/15 0706 01/12/15 0834 01/12/15 1127  GLUCAP 110* 115* 121* 128* 115*    Signed:  Marzetta Board  [facility] 01/12/2015, 2:55 PM

## 2015-01-12 NOTE — Progress Notes (Signed)
Discharged home with instructions given on medications,and follow up visits,patient verbalized understanding.Vital signs stable. No c/o pain or discomfort noted. Accompanied by staff to awaiting vehicle.

## 2015-01-12 NOTE — Progress Notes (Signed)
UR completed 

## 2015-01-17 ENCOUNTER — Telehealth: Payer: Self-pay | Admitting: Family Medicine

## 2015-01-17 NOTE — Telephone Encounter (Signed)
Dr. Feliberto Harts ( oral surgery ) requested a call back, he is aware that Dr. Sarajane Jews will not be back in the office until 01/23/15. He wants to make sure that pt is cleared for procedure and can you take a look at recent lab results.  Please call at [phone] and fax number is [phone].

## 2015-01-18 ENCOUNTER — Telehealth: Payer: Self-pay | Admitting: Cardiology

## 2015-01-18 NOTE — Telephone Encounter (Signed)
The patient is at acceptable risk for the planned procedure.  No further cardiovascular testing would be indicated.

## 2015-01-18 NOTE — Telephone Encounter (Signed)
FORWARD TO Dr Percival Spanish FOR CLEARANCE WILL FAX INSTRUCTION ONCE GIVEN

## 2015-01-18 NOTE — Telephone Encounter (Signed)
Request for surgical clearance:  1. What type of surgery is being performed? Surgical extraction of a tube under local anesthetic   2. When is this surgery scheduled? Not scheduled yet  3. Are there any medications that need to be held prior to surgery and how long?No   4. Name of physician performing surgery? Dr. Carollee Herter   5. What is your office phone and fax number? [phone]

## 2015-01-19 ENCOUNTER — Other Ambulatory Visit: Payer: Self-pay | Admitting: Family Medicine

## 2015-01-19 NOTE — Telephone Encounter (Signed)
Fax clearance letter to Dr Feliberto Harts

## 2015-01-23 NOTE — Telephone Encounter (Signed)
He is cleared for oral surgery. Dr. Percival Spanish cleared him from a cardiac perspective. As I look at his recent labs, his renal function and diabetes are also currently stable.

## 2015-01-23 NOTE — Telephone Encounter (Signed)
I spoke with dentist office and gave the below information, they will let Dr. Romie Minus know.

## 2015-01-24 ENCOUNTER — Ambulatory Visit: Payer: PRIVATE HEALTH INSURANCE | Admitting: Family Medicine

## 2015-02-03 DIAGNOSIS — M109 Gout, unspecified: Secondary | ICD-10-CM | POA: Diagnosis not present

## 2015-02-03 DIAGNOSIS — E039 Hypothyroidism, unspecified: Secondary | ICD-10-CM | POA: Diagnosis not present

## 2015-02-03 DIAGNOSIS — E785 Hyperlipidemia, unspecified: Secondary | ICD-10-CM | POA: Diagnosis not present

## 2015-02-03 DIAGNOSIS — Z940 Kidney transplant status: Secondary | ICD-10-CM | POA: Diagnosis not present

## 2015-02-03 DIAGNOSIS — E119 Type 2 diabetes mellitus without complications: Secondary | ICD-10-CM | POA: Diagnosis not present

## 2015-02-16 DIAGNOSIS — L84 Corns and callosities: Secondary | ICD-10-CM | POA: Diagnosis not present

## 2015-02-16 DIAGNOSIS — E1051 Type 1 diabetes mellitus with diabetic peripheral angiopathy without gangrene: Secondary | ICD-10-CM | POA: Diagnosis not present

## 2015-02-16 DIAGNOSIS — L603 Nail dystrophy: Secondary | ICD-10-CM | POA: Diagnosis not present

## 2015-02-16 DIAGNOSIS — I739 Peripheral vascular disease, unspecified: Secondary | ICD-10-CM | POA: Diagnosis not present

## 2015-02-24 ENCOUNTER — Encounter: Payer: Self-pay | Admitting: Family Medicine

## 2015-03-02 DIAGNOSIS — Z8673 Personal history of transient ischemic attack (TIA), and cerebral infarction without residual deficits: Secondary | ICD-10-CM | POA: Diagnosis not present

## 2015-03-02 DIAGNOSIS — Z792 Long term (current) use of antibiotics: Secondary | ICD-10-CM | POA: Diagnosis not present

## 2015-03-02 DIAGNOSIS — Z7982 Long term (current) use of aspirin: Secondary | ICD-10-CM | POA: Diagnosis not present

## 2015-03-02 DIAGNOSIS — D8989 Other specified disorders involving the immune mechanism, not elsewhere classified: Secondary | ICD-10-CM | POA: Diagnosis not present

## 2015-03-02 DIAGNOSIS — Z79899 Other long term (current) drug therapy: Secondary | ICD-10-CM | POA: Diagnosis not present

## 2015-03-02 DIAGNOSIS — I1311 Hypertensive heart and chronic kidney disease without heart failure, with stage 5 chronic kidney disease, or end stage renal disease: Secondary | ICD-10-CM | POA: Diagnosis not present

## 2015-03-02 DIAGNOSIS — R609 Edema, unspecified: Secondary | ICD-10-CM | POA: Diagnosis not present

## 2015-03-02 DIAGNOSIS — N186 End stage renal disease: Secondary | ICD-10-CM | POA: Diagnosis not present

## 2015-03-02 DIAGNOSIS — Z85828 Personal history of other malignant neoplasm of skin: Secondary | ICD-10-CM | POA: Diagnosis not present

## 2015-03-02 DIAGNOSIS — Z940 Kidney transplant status: Secondary | ICD-10-CM | POA: Diagnosis not present

## 2015-03-02 DIAGNOSIS — Z794 Long term (current) use of insulin: Secondary | ICD-10-CM | POA: Diagnosis not present

## 2015-03-02 DIAGNOSIS — E785 Hyperlipidemia, unspecified: Secondary | ICD-10-CM | POA: Diagnosis not present

## 2015-03-02 DIAGNOSIS — E1122 Type 2 diabetes mellitus with diabetic chronic kidney disease: Secondary | ICD-10-CM | POA: Diagnosis not present

## 2015-03-02 DIAGNOSIS — Z951 Presence of aortocoronary bypass graft: Secondary | ICD-10-CM | POA: Diagnosis not present

## 2015-03-02 DIAGNOSIS — Z954 Presence of other heart-valve replacement: Secondary | ICD-10-CM | POA: Diagnosis not present

## 2015-03-02 DIAGNOSIS — Z4822 Encounter for aftercare following kidney transplant: Secondary | ICD-10-CM | POA: Diagnosis not present

## 2015-03-03 ENCOUNTER — Other Ambulatory Visit: Payer: Self-pay | Admitting: Family Medicine

## 2015-03-06 ENCOUNTER — Other Ambulatory Visit: Payer: Self-pay | Admitting: Family Medicine

## 2015-03-09 ENCOUNTER — Other Ambulatory Visit: Payer: Self-pay | Admitting: Family Medicine

## 2015-03-09 ENCOUNTER — Other Ambulatory Visit: Payer: Self-pay | Admitting: Physician Assistant

## 2015-03-09 DIAGNOSIS — L820 Inflamed seborrheic keratosis: Secondary | ICD-10-CM | POA: Diagnosis not present

## 2015-03-09 DIAGNOSIS — D0439 Carcinoma in situ of skin of other parts of face: Secondary | ICD-10-CM | POA: Diagnosis not present

## 2015-03-09 DIAGNOSIS — C44329 Squamous cell carcinoma of skin of other parts of face: Secondary | ICD-10-CM | POA: Diagnosis not present

## 2015-03-09 DIAGNOSIS — L570 Actinic keratosis: Secondary | ICD-10-CM | POA: Diagnosis not present

## 2015-03-15 ENCOUNTER — Other Ambulatory Visit: Payer: Self-pay | Admitting: Family Medicine

## 2015-03-15 NOTE — Telephone Encounter (Signed)
I spoke with pharmacy and they did not receive the initial order that was sent e-scribe in March 2016, request to resend.

## 2015-03-17 DIAGNOSIS — D631 Anemia in chronic kidney disease: Secondary | ICD-10-CM | POA: Diagnosis not present

## 2015-03-17 DIAGNOSIS — I10 Essential (primary) hypertension: Secondary | ICD-10-CM | POA: Diagnosis not present

## 2015-03-17 DIAGNOSIS — Z940 Kidney transplant status: Secondary | ICD-10-CM | POA: Diagnosis not present

## 2015-03-17 DIAGNOSIS — E119 Type 2 diabetes mellitus without complications: Secondary | ICD-10-CM | POA: Diagnosis not present

## 2015-03-24 DIAGNOSIS — Z85828 Personal history of other malignant neoplasm of skin: Secondary | ICD-10-CM | POA: Diagnosis not present

## 2015-03-24 DIAGNOSIS — L820 Inflamed seborrheic keratosis: Secondary | ICD-10-CM | POA: Diagnosis not present

## 2015-03-24 DIAGNOSIS — Z08 Encounter for follow-up examination after completed treatment for malignant neoplasm: Secondary | ICD-10-CM | POA: Diagnosis not present

## 2015-04-02 ENCOUNTER — Other Ambulatory Visit: Payer: Self-pay | Admitting: Family Medicine

## 2015-04-06 DIAGNOSIS — Z940 Kidney transplant status: Secondary | ICD-10-CM | POA: Diagnosis not present

## 2015-04-17 ENCOUNTER — Other Ambulatory Visit: Payer: Self-pay

## 2015-04-20 ENCOUNTER — Other Ambulatory Visit: Payer: Self-pay | Admitting: Family Medicine

## 2015-04-20 NOTE — Telephone Encounter (Signed)
Can we refill this? 

## 2015-04-21 MED ORDER — INSULIN GLARGINE 100 UNIT/ML SOLOSTAR PEN: PEN_INJECTOR | SUBCUTANEOUS | Status: DC

## 2015-04-21 NOTE — Telephone Encounter (Signed)
Refill for one year 

## 2015-04-21 NOTE — Telephone Encounter (Signed)
I sent script e-scribe. 

## 2015-05-02 ENCOUNTER — Encounter: Payer: Self-pay | Admitting: Family Medicine

## 2015-05-02 ENCOUNTER — Other Ambulatory Visit: Payer: Self-pay | Admitting: Cardiology

## 2015-05-02 ENCOUNTER — Ambulatory Visit (INDEPENDENT_AMBULATORY_CARE_PROVIDER_SITE_OTHER): Payer: Medicare Other | Admitting: Family Medicine

## 2015-05-02 VITALS — BP 154/79 | HR 86 | Temp 99.7°F | Ht 72.0 in | Wt 229.0 lb

## 2015-05-02 DIAGNOSIS — H6123 Impacted cerumen, bilateral: Secondary | ICD-10-CM

## 2015-05-02 DIAGNOSIS — I6523 Occlusion and stenosis of bilateral carotid arteries: Secondary | ICD-10-CM

## 2015-05-02 NOTE — Progress Notes (Signed)
Pre visit review using our clinic review tool, if applicable. No additional management support is needed unless otherwise documented below in the visit note. 

## 2015-05-02 NOTE — Progress Notes (Signed)
   Subjective:    Patient ID: [name] Brown, male    DOB: [birth year], 79 y.o.   MRN: [id number]  HPI Here for probable ear wax build ups. He has had decreased hearing for a week. No pain.    Review of Systems  Constitutional: Negative.   HENT: Positive for hearing loss. Negative for congestion, ear discharge, ear pain, postnasal drip, rhinorrhea and sinus pressure.   Eyes: Negative.   Respiratory: Negative.   Neurological: Negative.        Objective:   Physical Exam  Constitutional: He is oriented to person, place, and time. He appears well-developed and well-nourished.  HENT:  Head: Normocephalic and atraumatic.  Nose: Nose normal.  Mouth/Throat: Oropharynx is clear and moist.  Both ear canals are full of cerumen   Eyes: Conjunctivae and EOM are normal. Pupils are equal, round, and reactive to light.  Neck: Neck supple. No thyromegaly present.  Cardiovascular: Normal rate, regular rhythm, normal heart sounds and intact distal pulses.   Pulmonary/Chest: Effort normal and breath sounds normal.  Lymphadenopathy:    He has no cervical adenopathy.  Neurological: He is alert and oriented to person, place, and time. No cranial nerve deficit.          Assessment & Plan:  Cerumen impactions. These were cleared with water irrigations.

## 2015-05-08 DIAGNOSIS — E1051 Type 1 diabetes mellitus with diabetic peripheral angiopathy without gangrene: Secondary | ICD-10-CM | POA: Diagnosis not present

## 2015-05-08 DIAGNOSIS — I739 Peripheral vascular disease, unspecified: Secondary | ICD-10-CM | POA: Diagnosis not present

## 2015-05-08 DIAGNOSIS — L603 Nail dystrophy: Secondary | ICD-10-CM | POA: Diagnosis not present

## 2015-05-08 DIAGNOSIS — L84 Corns and callosities: Secondary | ICD-10-CM | POA: Diagnosis not present

## 2015-05-09 DIAGNOSIS — E039 Hypothyroidism, unspecified: Secondary | ICD-10-CM | POA: Diagnosis not present

## 2015-05-09 DIAGNOSIS — N2581 Secondary hyperparathyroidism of renal origin: Secondary | ICD-10-CM | POA: Diagnosis not present

## 2015-05-09 DIAGNOSIS — E119 Type 2 diabetes mellitus without complications: Secondary | ICD-10-CM | POA: Diagnosis not present

## 2015-05-09 DIAGNOSIS — Z940 Kidney transplant status: Secondary | ICD-10-CM | POA: Diagnosis not present

## 2015-05-09 DIAGNOSIS — M109 Gout, unspecified: Secondary | ICD-10-CM | POA: Diagnosis not present

## 2015-05-10 ENCOUNTER — Ambulatory Visit (HOSPITAL_COMMUNITY): Payer: Medicare Other | Attending: Cardiovascular Disease

## 2015-05-10 DIAGNOSIS — I6523 Occlusion and stenosis of bilateral carotid arteries: Secondary | ICD-10-CM | POA: Insufficient documentation

## 2015-05-12 ENCOUNTER — Other Ambulatory Visit: Payer: Self-pay | Admitting: *Deleted

## 2015-05-12 DIAGNOSIS — I6523 Occlusion and stenosis of bilateral carotid arteries: Secondary | ICD-10-CM

## 2015-05-26 ENCOUNTER — Other Ambulatory Visit: Payer: Self-pay | Admitting: Family Medicine

## 2015-05-30 DIAGNOSIS — D631 Anemia in chronic kidney disease: Secondary | ICD-10-CM | POA: Diagnosis not present

## 2015-05-30 DIAGNOSIS — E119 Type 2 diabetes mellitus without complications: Secondary | ICD-10-CM | POA: Diagnosis not present

## 2015-05-30 DIAGNOSIS — I10 Essential (primary) hypertension: Secondary | ICD-10-CM | POA: Diagnosis not present

## 2015-05-30 DIAGNOSIS — Z940 Kidney transplant status: Secondary | ICD-10-CM | POA: Diagnosis not present

## 2015-06-08 DIAGNOSIS — Z940 Kidney transplant status: Secondary | ICD-10-CM | POA: Diagnosis not present

## 2015-06-13 ENCOUNTER — Other Ambulatory Visit: Payer: Self-pay | Admitting: Family Medicine

## 2015-06-13 NOTE — Telephone Encounter (Signed)
Call in #[phone] with one rf 

## 2015-06-24 ENCOUNTER — Other Ambulatory Visit: Payer: Self-pay | Admitting: Family Medicine

## 2015-06-27 NOTE — Telephone Encounter (Signed)
Refill Temazepam for 6 months and test strips for one year

## 2015-06-29 ENCOUNTER — Other Ambulatory Visit: Payer: Self-pay | Admitting: Family Medicine

## 2015-07-01 ENCOUNTER — Other Ambulatory Visit: Payer: Self-pay | Admitting: Family Medicine

## 2015-07-03 DIAGNOSIS — Z85828 Personal history of other malignant neoplasm of skin: Secondary | ICD-10-CM | POA: Diagnosis not present

## 2015-07-03 DIAGNOSIS — L570 Actinic keratosis: Secondary | ICD-10-CM | POA: Diagnosis not present

## 2015-07-03 DIAGNOSIS — Z08 Encounter for follow-up examination after completed treatment for malignant neoplasm: Secondary | ICD-10-CM | POA: Diagnosis not present

## 2015-07-03 DIAGNOSIS — L821 Other seborrheic keratosis: Secondary | ICD-10-CM | POA: Diagnosis not present

## 2015-07-04 NOTE — Telephone Encounter (Signed)
Call in #[phone] with 5 rf 

## 2015-07-06 DIAGNOSIS — Z940 Kidney transplant status: Secondary | ICD-10-CM | POA: Diagnosis not present

## 2015-07-26 ENCOUNTER — Other Ambulatory Visit: Payer: Self-pay | Admitting: Family Medicine

## 2015-07-28 NOTE — Telephone Encounter (Signed)
I spoke with pt and he had A1c done in April 2016. I sent script in for pen needles.

## 2015-08-03 DIAGNOSIS — E785 Hyperlipidemia, unspecified: Secondary | ICD-10-CM | POA: Diagnosis not present

## 2015-08-03 DIAGNOSIS — Z940 Kidney transplant status: Secondary | ICD-10-CM | POA: Diagnosis not present

## 2015-08-03 DIAGNOSIS — M109 Gout, unspecified: Secondary | ICD-10-CM | POA: Diagnosis not present

## 2015-08-03 DIAGNOSIS — E119 Type 2 diabetes mellitus without complications: Secondary | ICD-10-CM | POA: Diagnosis not present

## 2015-08-03 DIAGNOSIS — E039 Hypothyroidism, unspecified: Secondary | ICD-10-CM | POA: Diagnosis not present

## 2015-08-16 DIAGNOSIS — Z08 Encounter for follow-up examination after completed treatment for malignant neoplasm: Secondary | ICD-10-CM | POA: Diagnosis not present

## 2015-08-16 DIAGNOSIS — L820 Inflamed seborrheic keratosis: Secondary | ICD-10-CM | POA: Diagnosis not present

## 2015-08-16 DIAGNOSIS — Z85828 Personal history of other malignant neoplasm of skin: Secondary | ICD-10-CM | POA: Diagnosis not present

## 2015-08-16 DIAGNOSIS — L570 Actinic keratosis: Secondary | ICD-10-CM | POA: Diagnosis not present

## 2015-08-24 DIAGNOSIS — L603 Nail dystrophy: Secondary | ICD-10-CM | POA: Diagnosis not present

## 2015-08-24 DIAGNOSIS — I739 Peripheral vascular disease, unspecified: Secondary | ICD-10-CM | POA: Diagnosis not present

## 2015-08-24 DIAGNOSIS — E1051 Type 1 diabetes mellitus with diabetic peripheral angiopathy without gangrene: Secondary | ICD-10-CM | POA: Diagnosis not present

## 2015-09-05 DIAGNOSIS — E039 Hypothyroidism, unspecified: Secondary | ICD-10-CM | POA: Diagnosis not present

## 2015-09-05 DIAGNOSIS — I6529 Occlusion and stenosis of unspecified carotid artery: Secondary | ICD-10-CM | POA: Diagnosis not present

## 2015-09-05 DIAGNOSIS — I10 Essential (primary) hypertension: Secondary | ICD-10-CM | POA: Diagnosis not present

## 2015-09-05 DIAGNOSIS — N189 Chronic kidney disease, unspecified: Secondary | ICD-10-CM | POA: Diagnosis not present

## 2015-09-05 DIAGNOSIS — E785 Hyperlipidemia, unspecified: Secondary | ICD-10-CM | POA: Diagnosis not present

## 2015-09-05 DIAGNOSIS — Z940 Kidney transplant status: Secondary | ICD-10-CM | POA: Diagnosis not present

## 2015-09-05 DIAGNOSIS — D631 Anemia in chronic kidney disease: Secondary | ICD-10-CM | POA: Diagnosis not present

## 2015-09-05 DIAGNOSIS — D6959 Other secondary thrombocytopenia: Secondary | ICD-10-CM | POA: Diagnosis not present

## 2015-09-05 DIAGNOSIS — Z23 Encounter for immunization: Secondary | ICD-10-CM | POA: Diagnosis not present

## 2015-09-05 DIAGNOSIS — E119 Type 2 diabetes mellitus without complications: Secondary | ICD-10-CM | POA: Diagnosis not present

## 2015-09-05 DIAGNOSIS — N281 Cyst of kidney, acquired: Secondary | ICD-10-CM | POA: Diagnosis not present

## 2015-09-05 DIAGNOSIS — I480 Paroxysmal atrial fibrillation: Secondary | ICD-10-CM | POA: Diagnosis not present

## 2015-09-11 DIAGNOSIS — Z940 Kidney transplant status: Secondary | ICD-10-CM | POA: Diagnosis not present

## 2015-09-18 ENCOUNTER — Other Ambulatory Visit: Payer: Self-pay | Admitting: Family Medicine

## 2015-09-18 MED ORDER — GLUCOSE BLOOD VI STRP: ORAL_STRIP | Status: DC

## 2015-09-19 ENCOUNTER — Other Ambulatory Visit: Payer: Self-pay | Admitting: Family Medicine

## 2015-09-19 NOTE — Telephone Encounter (Signed)
Janie from [facility] called saying the pt's Test Strip Rx was sent to the wrong location. Due to the pt having Medicare Part B, the Rx can't be transferred to their location. Please [facility] to [facility] [phone]. Per Narda Rutherford the Rx has to be faxed. In addition, please list the Diagnosis Code on the fax as well.   Fax # [phone] [facility] [phone]  Thank you.

## 2015-09-20 MED ORDER — GLUCOSE BLOOD VI STRP: ORAL_STRIP | Status: DC

## 2015-09-20 NOTE — Addendum Note (Signed)
Addended by: Ailene Rud E on: 09/20/2015 10:53 AM   Modules accepted: Orders

## 2015-09-27 ENCOUNTER — Telehealth: Payer: Self-pay | Admitting: Family Medicine

## 2015-09-27 NOTE — Telephone Encounter (Signed)
[name] Brown called saying he received a phone call from [facility] saying his medication is ready but it's located at the wrong pharmacy. He lives in [place] and [facility] [address]. [facility] [address] [facility]. Please update this in his file. If you have any questions or concerns, please feel free to contact him.  Pt's ph# [phone] Thank you.

## 2015-09-28 NOTE — Telephone Encounter (Signed)
Left a voicemail to pt to call me back

## 2015-09-28 NOTE — Telephone Encounter (Signed)
I updated pharmacy in pt's chart. Scripts can be transferred, unless its a controlled substance, then we will need to know what medication he needs.

## 2015-09-29 NOTE — Telephone Encounter (Signed)
Left a message for pt to let him know the pharmacy has been updated in his chart and to call [facility] to see if they can transfer the medications to the [facility] Location.

## 2015-10-04 DIAGNOSIS — H66001 Acute suppurative otitis media without spontaneous rupture of ear drum, right ear: Secondary | ICD-10-CM | POA: Diagnosis not present

## 2015-10-09 DIAGNOSIS — Z940 Kidney transplant status: Secondary | ICD-10-CM | POA: Diagnosis not present

## 2015-10-10 ENCOUNTER — Other Ambulatory Visit: Payer: Self-pay | Admitting: Family Medicine

## 2015-10-10 MED ORDER — METFORMIN HCL 1000 MG PO TABS: ORAL_TABLET | ORAL | Status: DC

## 2015-11-03 ENCOUNTER — Encounter: Payer: Self-pay | Admitting: Cardiology

## 2015-11-03 ENCOUNTER — Ambulatory Visit (INDEPENDENT_AMBULATORY_CARE_PROVIDER_SITE_OTHER): Payer: Medicare Other | Admitting: Cardiology

## 2015-11-03 VITALS — BP 124/72 | HR 77 | Ht 72.0 in | Wt 228.3 lb

## 2015-11-03 DIAGNOSIS — I481 Persistent atrial fibrillation: Secondary | ICD-10-CM | POA: Diagnosis not present

## 2015-11-03 DIAGNOSIS — I4819 Other persistent atrial fibrillation: Secondary | ICD-10-CM

## 2015-11-03 NOTE — Progress Notes (Signed)
HPI The patient presents for follow up of his AVR and CAD.  Since I last saw him he has done well. He status post renal transplant a few years ago. He is followed closely at [facility]. He actually moved to [facility] to be a smaller house. He did have a hospitalization as he accidentally overdosed with his insulin and had hypoglycemia. I reviewed these records. He's had no significant cardiovascular issues. He had to sell his bicycle because he is in a smaller house. He doesn't walk outside because of his multiple skin cancers. With his little bit of walking that he does he's not having any symptoms.He does not describe chest pressure, neck or arm discomfort. He's not having any palpitations, presyncope or syncope. He's having no PND or orthopnea.    Allergies  Allergen Reactions  . Penicillins     REACTION: hives    Current Outpatient Prescriptions  Medication Sig Dispense Refill  . allopurinol (ZYLOPRIM) 100 MG tablet TAKE 1 TABLET BY MOUTH EVERY DAY AS DIRECTED 30 tablet 3  . ALPRAZolam (XANAX) 0.5 MG tablet TAKE 1 TABLET EVERY 6 HOURS AS NEEDED 120 tablet 5  . amLODipine (NORVASC) 2.5 MG tablet Take 1 tablet by mouth daily. Take 1 tab daily    . aspirin 81 MG tablet Take 81 mg by mouth daily.    . B-D ULTRAFINE III SHORT PEN 31G X 8 MM MISC USE AS DIRECTED 100 each 0  . B-D ULTRAFINE III SHORT PEN 31G X 8 MM MISC USE AS DIRECTED 100 each 1  . calcium carbonate (TUMS - DOSED IN MG ELEMENTAL CALCIUM) 500 MG chewable tablet Chew 1 tablet by mouth daily.     . carvedilol (COREG) 12.5 MG tablet Take 1 tablet by mouth 2 (two) times daily.    . colchicine 0.6 MG tablet Take 0.6 mg by mouth daily as needed (gout).     Mariane Baumgarten Sodium (COLACE PO) Take 1 capsule by mouth 2 (two) times daily.     . furosemide (LASIX) 40 MG tablet Take 40 mg by mouth daily.     Marland Kitchen gabapentin (NEURONTIN) 100 MG capsule TAKE ONE CAPSULE 3 TIMES A DAY (Patient taking differently: take 1 capsule three times per week)  90 capsule 3  . glucose blood (ONE TOUCH ULTRA TEST) test strip Diagnosis code is E 11.9 100 each 5  . HYDROcodone-homatropine (HYDROMET) 5-1.5 MG/5ML syrup Take 5 mLs by mouth every 4 (four) hours as needed. 240 mL 0  . Insulin Glargine (LANTUS SOLOSTAR) 100 UNIT/ML Solostar Pen 30 units at bedtime    . insulin lispro (HUMALOG) 100 UNIT/ML injection Sliding scale 10 mL 12  . Krill Oil 1000 MG CAPS Take 1,000 mg by mouth daily.    Marland Kitchen lactulose (CHRONULAC) 10 GM/15ML solution Take 10 g by mouth as needed for mild constipation.     Marland Kitchen levothyroxine (SYNTHROID, LEVOTHROID) 25 MCG tablet Take 25 mcg by mouth daily.     . metFORMIN (GLUCOPHAGE) 1000 MG tablet TAKE 1 TABLET BY MOUTH TWICE A DAY WITH A MEAL 180 tablet 0  . mycophenolate (MYFORTIC) 180 MG EC tablet Take 1 tablet (180 mg total) by mouth 2 (two) times daily. 2 tablet 0  . omeprazole (PRILOSEC) 20 MG capsule Take 20 mg by mouth daily.     Marland Kitchen oxyCODONE (OXY IR/ROXICODONE) 5 MG immediate release tablet Take 1 tablet (5 mg total) by mouth every 6 (six) hours as needed for moderate pain. 120 tablet 0  .  simvastatin (ZOCOR) 10 MG tablet Take 1 tablet (10 mg total) by mouth at bedtime. 1 tablet 0  . sulfamethoxazole-trimethoprim (BACTRIM,SEPTRA) 400-80 MG per tablet Take 1 tablet by mouth as directed. Monday Wednesday and friday    . tacrolimus (PROGRAF) 1 MG capsule Take 3 capsules (3 mg total) by mouth 2 (two) times daily. (Patient taking differently: Take 1 mg by mouth 2 (two) times daily. Take 3 in AM and 2 in PM) 6 capsule 0  . temazepam (RESTORIL) 30 MG capsule TAKE ONE CAPSULE BY MOUTH AT BEDTIME AS NEEDED **FILL 12/22/14** 30 capsule 5  . traMADol (ULTRAM) 50 MG tablet TAKE 2 TABLETS BY MOUTH EVERY 6 HOURS AS NEEDED FOR MODERATE PAIN 240 tablet 1   No current facility-administered medications for this visit.    Past Medical History  Diagnosis Date  . CAD (coronary artery disease)   . Hypertension   . Hyperlipidemia   . Myocardial  infarction ([place])   . Diabetes mellitus   . Gout   . Obesity   . Chronic kidney disease     chronic renal failure  . Atrial fibrillation ([place])   . CVA (cerebral infarction) 05/2010  . Thrombocytopenia ([place]) 2011    sees Dr. Lamonte Sakai  . Splenomegaly 2012  . Anemia associated with chronic renal failure   . Aortic stenosis   . Anemia 01/02/2012  . Thrombocytopenia [facility])     Past Surgical History  Procedure Laterality Date  . Appendectomy    . Coronary artery bypass graft      June 2 011 Limited the LAD, SVG to OM, SVG to PDA  . Bone spurs      from right heel  . Aortic valve replacement      Pericardial tissue valve  . Kidney transplant  01-20-12    at [place]:  As stated in the HPI and negative for all other systems.  PHYSICAL EXAM BP 124/72 mmHg  Pulse 77  Ht 6' (1.829 m)  Wt 228 lb 5 oz (103.562 kg)  BMI 30.96 kg/m2 GENERAL:  Well appearing NECK:  No jugular venous distention, waveform within normal limits, carotid upstroke brisk and symmetric, no bruits, no thyromegaly LYMPHATICS:  No cervical, inguinal adenopathy LUNGS:  Clear to auscultation bilaterally CHEST:  Well healed sternotomy scar. HEART:  PMI not displaced or sustained,S1 and S2 within normal limits, no S3, no S4, no clicks, no rubs, brief apical systolic murmur ABD:  Flat, positive bowel sounds normal in frequency in pitch, no bruits, no rebound, no guarding, no midline pulsatile mass, no hepatomegaly, no splenomegaly, well healed renal transplant scar, mildly obese EXT:  2 plus pulses throughout, right upper dialysis fistula, no edema, no cyanosis no clubbing SKIN:  No rashes no nodules  EKG:  Sinus rhythm, right axis deviation, poor anterior R wave progression,  rate 77, QTC slightly prolonged compared to EKG last year, no acute ST-T wave changes.   11/03/2015   ASSESSMENT AND PLAN  S/P aortic valve replacement -  His last echo in 2013 showed stable AVR.  I will repeat this.   CORONARY  ARTERY DISEASE -  The patient has no new sypmtoms. He has had no change since his CABG. He will continue with risk reduction.  HYPERTENSION - The blood pressure is at target. No change in medications is indicated. We will continue with therapeutic lifestyle changes (TLC).;  Dyslipidemia - He is going to have a lipid profile drawn and further management  will be based on these results.  CAROTID STENOSIS - This was not progressed in July of last year and is due for follow-up again only in 2 years.  QTC PROLONGED - We discussed the need to avoid QT prolonging drugs. He has no symptoms related to this.

## 2015-11-03 NOTE — Patient Instructions (Addendum)
Medication Instructions:  Your physician recommends that you continue on your current medications as directed. Please refer to the Current Medication list given to you today.  Labwork: NONE  Testing/Procedures: Your physician has requested that you have an echocardiogram. Echocardiography is a painless test that uses sound waves to create images of your heart. It provides your doctor with information about the size and shape of your heart and how well your heart's chambers and valves are working. This procedure takes approximately one hour. There are no restrictions for this procedure.  Follow-Up: Your physician wants you to follow-up in: [place] will receive a reminder letter in the mail two months in advance. If you don't receive a letter, please call our office to schedule the follow-up appointment.  If you need a refill on your cardiac medications before your next appointment, please call your pharmacy.

## 2015-11-08 DIAGNOSIS — N2581 Secondary hyperparathyroidism of renal origin: Secondary | ICD-10-CM | POA: Diagnosis not present

## 2015-11-08 DIAGNOSIS — E039 Hypothyroidism, unspecified: Secondary | ICD-10-CM | POA: Diagnosis not present

## 2015-11-08 DIAGNOSIS — N529 Male erectile dysfunction, unspecified: Secondary | ICD-10-CM | POA: Diagnosis not present

## 2015-11-08 DIAGNOSIS — Z940 Kidney transplant status: Secondary | ICD-10-CM | POA: Diagnosis not present

## 2015-11-15 ENCOUNTER — Ambulatory Visit (HOSPITAL_COMMUNITY): Payer: Medicare Other | Attending: Internal Medicine

## 2015-11-15 ENCOUNTER — Other Ambulatory Visit: Payer: Self-pay

## 2015-11-15 DIAGNOSIS — I481 Persistent atrial fibrillation: Secondary | ICD-10-CM | POA: Diagnosis not present

## 2015-11-15 DIAGNOSIS — I4891 Unspecified atrial fibrillation: Secondary | ICD-10-CM | POA: Diagnosis present

## 2015-11-15 DIAGNOSIS — Z954 Presence of other heart-valve replacement: Secondary | ICD-10-CM | POA: Insufficient documentation

## 2015-11-15 DIAGNOSIS — I10 Essential (primary) hypertension: Secondary | ICD-10-CM | POA: Insufficient documentation

## 2015-11-15 DIAGNOSIS — I4819 Other persistent atrial fibrillation: Secondary | ICD-10-CM

## 2015-11-15 DIAGNOSIS — I517 Cardiomegaly: Secondary | ICD-10-CM | POA: Diagnosis not present

## 2015-11-15 DIAGNOSIS — E119 Type 2 diabetes mellitus without complications: Secondary | ICD-10-CM | POA: Insufficient documentation

## 2015-11-15 DIAGNOSIS — I059 Rheumatic mitral valve disease, unspecified: Secondary | ICD-10-CM | POA: Insufficient documentation

## 2015-11-15 DIAGNOSIS — E785 Hyperlipidemia, unspecified: Secondary | ICD-10-CM | POA: Diagnosis not present

## 2015-11-15 MED ORDER — PERFLUTREN LIPID MICROSPHERE
1.0000 mL | INTRAVENOUS | Status: AC | PRN
  Administered 2015-11-15: 2 mL via INTRAVENOUS

## 2015-11-18 ENCOUNTER — Other Ambulatory Visit: Payer: Self-pay | Admitting: Family Medicine

## 2015-11-27 ENCOUNTER — Ambulatory Visit (INDEPENDENT_AMBULATORY_CARE_PROVIDER_SITE_OTHER): Payer: Medicare Other | Admitting: Family Medicine

## 2015-11-27 ENCOUNTER — Encounter: Payer: Self-pay | Admitting: Family Medicine

## 2015-11-27 VITALS — BP 138/75 | HR 78 | Temp 98.4°F | Ht 72.0 in | Wt 229.0 lb

## 2015-11-27 DIAGNOSIS — H6123 Impacted cerumen, bilateral: Secondary | ICD-10-CM | POA: Diagnosis not present

## 2015-11-27 DIAGNOSIS — I10 Essential (primary) hypertension: Secondary | ICD-10-CM

## 2015-11-27 DIAGNOSIS — I5022 Chronic systolic (congestive) heart failure: Secondary | ICD-10-CM | POA: Diagnosis not present

## 2015-11-27 DIAGNOSIS — Z940 Kidney transplant status: Secondary | ICD-10-CM | POA: Diagnosis not present

## 2015-11-27 DIAGNOSIS — D6959 Other secondary thrombocytopenia: Secondary | ICD-10-CM | POA: Diagnosis not present

## 2015-11-27 DIAGNOSIS — E291 Testicular hypofunction: Secondary | ICD-10-CM | POA: Diagnosis not present

## 2015-11-27 DIAGNOSIS — E785 Hyperlipidemia, unspecified: Secondary | ICD-10-CM | POA: Diagnosis not present

## 2015-11-27 DIAGNOSIS — D631 Anemia in chronic kidney disease: Secondary | ICD-10-CM | POA: Diagnosis not present

## 2015-11-27 DIAGNOSIS — E119 Type 2 diabetes mellitus without complications: Secondary | ICD-10-CM | POA: Diagnosis not present

## 2015-11-27 DIAGNOSIS — N281 Cyst of kidney, acquired: Secondary | ICD-10-CM | POA: Diagnosis not present

## 2015-11-27 DIAGNOSIS — N189 Chronic kidney disease, unspecified: Secondary | ICD-10-CM | POA: Diagnosis not present

## 2015-11-27 DIAGNOSIS — E039 Hypothyroidism, unspecified: Secondary | ICD-10-CM | POA: Diagnosis not present

## 2015-11-27 MED ORDER — TEMAZEPAM 30 MG PO CAPS
ORAL_CAPSULE | ORAL | Status: DC
Start: 2015-11-27 — End: 2016-06-06

## 2015-11-27 MED ORDER — OXYCODONE HCL 5 MG PO TABS: 5.0000 mg | ORAL_TABLET | Freq: Four times a day (QID) | ORAL | Status: DC | PRN

## 2015-11-27 MED ORDER — TESTOSTERONE 20.25 MG/ACT (1.62%) TD GEL: 4.0000 | Freq: Every day | TRANSDERMAL | Status: DC

## 2015-11-27 MED ORDER — INSULIN ASPART 100 UNIT/ML FLEXPEN: PEN_INJECTOR | SUBCUTANEOUS | Status: DC

## 2015-11-27 NOTE — Progress Notes (Signed)
Pre visit review using our clinic review tool, if applicable. No additional management support is needed unless otherwise documented below in the visit note. 

## 2015-11-27 NOTE — Progress Notes (Signed)
   Subjective:    Patient ID: SAHARSH COBARRUBIAS, male    DOB: 10/21/1936, 80 y.o.   MRN: [id number]  HPI Here to several issues. First he thinks his ears need to be flushed again since his hearing is down. Second his insurance wants him to change from Humalog to Novolog. His glucoses have been stable and his A1c remains in the 6 to 7 range. Also his recent labs form Dr. Lorrene Reid included a low testosterone level at 282. He admits to low libido and erectile problems.    Review of Systems  Constitutional: Negative.   HENT: Positive for hearing loss. Negative for ear pain.   Respiratory: Negative.   Cardiovascular: Negative.   Neurological: Negative.        Objective:   Physical Exam  Constitutional: He is oriented to person, place, and time. He appears well-developed and well-nourished.  HENT:  Both ears are full of cerumen   Cardiovascular: Normal rate, regular rhythm, normal heart sounds and intact distal pulses.   Pulmonary/Chest: Effort normal and breath sounds normal.  Neurological: He is alert and oriented to person, place, and time.          Assessment & Plan:  His diabetes is stable, but we will switch to Novolog as above. The cerumen impactions were irrigated clear wit water. He will start on Androgel and we will check a testosterone level in 6 months.

## 2015-12-12 DIAGNOSIS — Z940 Kidney transplant status: Secondary | ICD-10-CM | POA: Diagnosis not present

## 2015-12-17 ENCOUNTER — Other Ambulatory Visit: Payer: Self-pay | Admitting: Family Medicine

## 2015-12-18 NOTE — Telephone Encounter (Signed)
Call in #[phone] with one rf 

## 2015-12-19 NOTE — Telephone Encounter (Signed)
Rx called in as directed.   

## 2015-12-23 ENCOUNTER — Other Ambulatory Visit: Payer: Self-pay | Admitting: Family Medicine

## 2016-01-05 DIAGNOSIS — L821 Other seborrheic keratosis: Secondary | ICD-10-CM | POA: Diagnosis not present

## 2016-01-05 DIAGNOSIS — L570 Actinic keratosis: Secondary | ICD-10-CM | POA: Diagnosis not present

## 2016-01-05 DIAGNOSIS — D225 Melanocytic nevi of trunk: Secondary | ICD-10-CM | POA: Diagnosis not present

## 2016-01-05 DIAGNOSIS — Z85828 Personal history of other malignant neoplasm of skin: Secondary | ICD-10-CM | POA: Diagnosis not present

## 2016-01-09 DIAGNOSIS — Z940 Kidney transplant status: Secondary | ICD-10-CM | POA: Diagnosis not present

## 2016-01-11 ENCOUNTER — Other Ambulatory Visit: Payer: Self-pay | Admitting: Family Medicine

## 2016-01-18 DIAGNOSIS — Z940 Kidney transplant status: Secondary | ICD-10-CM | POA: Diagnosis not present

## 2016-01-24 DIAGNOSIS — Q6102 Congenital multiple renal cysts: Secondary | ICD-10-CM | POA: Diagnosis not present

## 2016-01-24 DIAGNOSIS — N185 Chronic kidney disease, stage 5: Secondary | ICD-10-CM | POA: Diagnosis not present

## 2016-01-24 DIAGNOSIS — Z940 Kidney transplant status: Secondary | ICD-10-CM | POA: Diagnosis not present

## 2016-02-12 DIAGNOSIS — E119 Type 2 diabetes mellitus without complications: Secondary | ICD-10-CM | POA: Diagnosis not present

## 2016-02-12 DIAGNOSIS — E039 Hypothyroidism, unspecified: Secondary | ICD-10-CM | POA: Diagnosis not present

## 2016-02-12 DIAGNOSIS — Z940 Kidney transplant status: Secondary | ICD-10-CM | POA: Diagnosis not present

## 2016-02-20 DIAGNOSIS — E119 Type 2 diabetes mellitus without complications: Secondary | ICD-10-CM | POA: Diagnosis not present

## 2016-02-20 DIAGNOSIS — K219 Gastro-esophageal reflux disease without esophagitis: Secondary | ICD-10-CM | POA: Diagnosis present

## 2016-02-20 DIAGNOSIS — I517 Cardiomegaly: Secondary | ICD-10-CM | POA: Diagnosis not present

## 2016-02-20 DIAGNOSIS — R0789 Other chest pain: Secondary | ICD-10-CM | POA: Diagnosis present

## 2016-02-20 DIAGNOSIS — R072 Precordial pain: Secondary | ICD-10-CM | POA: Diagnosis not present

## 2016-02-20 DIAGNOSIS — Z794 Long term (current) use of insulin: Secondary | ICD-10-CM | POA: Diagnosis not present

## 2016-02-20 DIAGNOSIS — I2510 Atherosclerotic heart disease of native coronary artery without angina pectoris: Secondary | ICD-10-CM | POA: Diagnosis not present

## 2016-02-20 DIAGNOSIS — I10 Essential (primary) hypertension: Secondary | ICD-10-CM | POA: Diagnosis not present

## 2016-02-20 DIAGNOSIS — E669 Obesity, unspecified: Secondary | ICD-10-CM | POA: Diagnosis present

## 2016-02-20 DIAGNOSIS — I4891 Unspecified atrial fibrillation: Secondary | ICD-10-CM | POA: Diagnosis present

## 2016-02-20 DIAGNOSIS — N189 Chronic kidney disease, unspecified: Secondary | ICD-10-CM | POA: Diagnosis not present

## 2016-02-20 DIAGNOSIS — D649 Anemia, unspecified: Secondary | ICD-10-CM | POA: Diagnosis not present

## 2016-02-20 DIAGNOSIS — Z79899 Other long term (current) drug therapy: Secondary | ICD-10-CM | POA: Diagnosis not present

## 2016-02-20 DIAGNOSIS — Z7982 Long term (current) use of aspirin: Secondary | ICD-10-CM | POA: Diagnosis not present

## 2016-02-20 DIAGNOSIS — I130 Hypertensive heart and chronic kidney disease with heart failure and stage 1 through stage 4 chronic kidney disease, or unspecified chronic kidney disease: Secondary | ICD-10-CM | POA: Diagnosis not present

## 2016-02-20 DIAGNOSIS — I161 Hypertensive emergency: Secondary | ICD-10-CM | POA: Diagnosis present

## 2016-02-20 DIAGNOSIS — D696 Thrombocytopenia, unspecified: Secondary | ICD-10-CM | POA: Diagnosis present

## 2016-02-20 DIAGNOSIS — I509 Heart failure, unspecified: Secondary | ICD-10-CM | POA: Diagnosis present

## 2016-02-20 DIAGNOSIS — Z7984 Long term (current) use of oral hypoglycemic drugs: Secondary | ICD-10-CM | POA: Diagnosis not present

## 2016-02-20 DIAGNOSIS — E1122 Type 2 diabetes mellitus with diabetic chronic kidney disease: Secondary | ICD-10-CM | POA: Diagnosis present

## 2016-02-20 DIAGNOSIS — Z940 Kidney transplant status: Secondary | ICD-10-CM | POA: Diagnosis not present

## 2016-02-20 DIAGNOSIS — R0989 Other specified symptoms and signs involving the circulatory and respiratory systems: Secondary | ICD-10-CM | POA: Diagnosis not present

## 2016-02-20 DIAGNOSIS — Z951 Presence of aortocoronary bypass graft: Secondary | ICD-10-CM | POA: Diagnosis not present

## 2016-02-20 DIAGNOSIS — I272 Other secondary pulmonary hypertension: Secondary | ICD-10-CM | POA: Diagnosis present

## 2016-02-20 DIAGNOSIS — Z8673 Personal history of transient ischemic attack (TIA), and cerebral infarction without residual deficits: Secondary | ICD-10-CM | POA: Diagnosis not present

## 2016-02-20 DIAGNOSIS — Z952 Presence of prosthetic heart valve: Secondary | ICD-10-CM | POA: Diagnosis not present

## 2016-02-20 DIAGNOSIS — R079 Chest pain, unspecified: Secondary | ICD-10-CM | POA: Diagnosis not present

## 2016-02-20 DIAGNOSIS — I081 Rheumatic disorders of both mitral and tricuspid valves: Secondary | ICD-10-CM | POA: Diagnosis present

## 2016-02-21 DIAGNOSIS — Z940 Kidney transplant status: Secondary | ICD-10-CM | POA: Diagnosis not present

## 2016-02-21 DIAGNOSIS — R079 Chest pain, unspecified: Secondary | ICD-10-CM | POA: Diagnosis not present

## 2016-02-21 DIAGNOSIS — I272 Other secondary pulmonary hypertension: Secondary | ICD-10-CM | POA: Diagnosis not present

## 2016-02-21 DIAGNOSIS — R072 Precordial pain: Secondary | ICD-10-CM | POA: Diagnosis not present

## 2016-02-21 DIAGNOSIS — I10 Essential (primary) hypertension: Secondary | ICD-10-CM | POA: Diagnosis not present

## 2016-02-21 DIAGNOSIS — Z952 Presence of prosthetic heart valve: Secondary | ICD-10-CM | POA: Diagnosis not present

## 2016-02-21 DIAGNOSIS — I161 Hypertensive emergency: Secondary | ICD-10-CM | POA: Diagnosis not present

## 2016-02-21 DIAGNOSIS — E119 Type 2 diabetes mellitus without complications: Secondary | ICD-10-CM | POA: Diagnosis not present

## 2016-02-21 DIAGNOSIS — E1122 Type 2 diabetes mellitus with diabetic chronic kidney disease: Secondary | ICD-10-CM | POA: Diagnosis not present

## 2016-02-21 DIAGNOSIS — I509 Heart failure, unspecified: Secondary | ICD-10-CM | POA: Diagnosis not present

## 2016-02-21 DIAGNOSIS — I2510 Atherosclerotic heart disease of native coronary artery without angina pectoris: Secondary | ICD-10-CM | POA: Diagnosis not present

## 2016-02-21 DIAGNOSIS — Z951 Presence of aortocoronary bypass graft: Secondary | ICD-10-CM | POA: Diagnosis not present

## 2016-02-21 DIAGNOSIS — D696 Thrombocytopenia, unspecified: Secondary | ICD-10-CM | POA: Diagnosis not present

## 2016-02-22 ENCOUNTER — Telehealth: Payer: Self-pay | Admitting: Family Medicine

## 2016-02-22 DIAGNOSIS — E1122 Type 2 diabetes mellitus with diabetic chronic kidney disease: Secondary | ICD-10-CM

## 2016-02-22 DIAGNOSIS — Z794 Long term (current) use of insulin: Principal | ICD-10-CM

## 2016-02-22 MED ORDER — INSULIN ASPART 100 UNIT/ML FLEXPEN: PEN_INJECTOR | SUBCUTANEOUS | Status: DC

## 2016-02-22 MED ORDER — INSULIN GLARGINE 100 UNIT/ML SOLOSTAR PEN: PEN_INJECTOR | SUBCUTANEOUS | Status: DC

## 2016-02-22 NOTE — Telephone Encounter (Signed)
Patient states that he gets his labs done at [facility] - his last A1c was about 5 months ago and it was 6.5%

## 2016-02-22 NOTE — Telephone Encounter (Signed)
Pharmacy needs verification on units of insulin for Novolog.

## 2016-02-22 NOTE — Telephone Encounter (Signed)
Ready to fax top the pharmacy

## 2016-02-22 NOTE — Telephone Encounter (Signed)
I will need to send in new script.

## 2016-02-26 ENCOUNTER — Telehealth: Payer: Self-pay | Admitting: Cardiology

## 2016-02-26 NOTE — Telephone Encounter (Signed)
Received records from [facility] for appointment on 03/15/16 with Tarri Fuller, P.A.  Records given to [employer] (medical records for Bryan's schedule on 03/15/16. lp

## 2016-03-04 DIAGNOSIS — I2510 Atherosclerotic heart disease of native coronary artery without angina pectoris: Secondary | ICD-10-CM | POA: Diagnosis not present

## 2016-03-04 DIAGNOSIS — E785 Hyperlipidemia, unspecified: Secondary | ICD-10-CM | POA: Diagnosis not present

## 2016-03-04 DIAGNOSIS — E1122 Type 2 diabetes mellitus with diabetic chronic kidney disease: Secondary | ICD-10-CM | POA: Diagnosis not present

## 2016-03-04 DIAGNOSIS — M1A30X0 Chronic gout due to renal impairment, unspecified site, without tophus (tophi): Secondary | ICD-10-CM | POA: Diagnosis not present

## 2016-03-04 DIAGNOSIS — Z794 Long term (current) use of insulin: Secondary | ICD-10-CM | POA: Diagnosis not present

## 2016-03-04 DIAGNOSIS — Z8673 Personal history of transient ischemic attack (TIA), and cerebral infarction without residual deficits: Secondary | ICD-10-CM | POA: Diagnosis not present

## 2016-03-04 DIAGNOSIS — I509 Heart failure, unspecified: Secondary | ICD-10-CM | POA: Diagnosis not present

## 2016-03-04 DIAGNOSIS — Z4822 Encounter for aftercare following kidney transplant: Secondary | ICD-10-CM | POA: Diagnosis not present

## 2016-03-04 DIAGNOSIS — R609 Edema, unspecified: Secondary | ICD-10-CM | POA: Diagnosis not present

## 2016-03-04 DIAGNOSIS — Z7982 Long term (current) use of aspirin: Secondary | ICD-10-CM | POA: Diagnosis not present

## 2016-03-04 DIAGNOSIS — I132 Hypertensive heart and chronic kidney disease with heart failure and with stage 5 chronic kidney disease, or end stage renal disease: Secondary | ICD-10-CM | POA: Diagnosis not present

## 2016-03-04 DIAGNOSIS — Z951 Presence of aortocoronary bypass graft: Secondary | ICD-10-CM | POA: Diagnosis not present

## 2016-03-04 DIAGNOSIS — D899 Disorder involving the immune mechanism, unspecified: Secondary | ICD-10-CM | POA: Diagnosis not present

## 2016-03-04 DIAGNOSIS — E669 Obesity, unspecified: Secondary | ICD-10-CM | POA: Diagnosis not present

## 2016-03-04 DIAGNOSIS — I4891 Unspecified atrial fibrillation: Secondary | ICD-10-CM | POA: Diagnosis not present

## 2016-03-04 DIAGNOSIS — D631 Anemia in chronic kidney disease: Secondary | ICD-10-CM | POA: Diagnosis not present

## 2016-03-04 DIAGNOSIS — Z79899 Other long term (current) drug therapy: Secondary | ICD-10-CM | POA: Diagnosis not present

## 2016-03-04 DIAGNOSIS — D8989 Other specified disorders involving the immune mechanism, not elsewhere classified: Secondary | ICD-10-CM | POA: Diagnosis not present

## 2016-03-04 DIAGNOSIS — N186 End stage renal disease: Secondary | ICD-10-CM | POA: Diagnosis not present

## 2016-03-04 DIAGNOSIS — I10 Essential (primary) hypertension: Secondary | ICD-10-CM | POA: Diagnosis not present

## 2016-03-04 DIAGNOSIS — Z940 Kidney transplant status: Secondary | ICD-10-CM | POA: Diagnosis not present

## 2016-03-13 ENCOUNTER — Other Ambulatory Visit: Payer: Self-pay | Admitting: Family Medicine

## 2016-03-14 ENCOUNTER — Ambulatory Visit: Payer: Medicare Other | Admitting: Physician Assistant

## 2016-03-15 ENCOUNTER — Ambulatory Visit: Payer: Medicare Other | Admitting: Physician Assistant

## 2016-03-15 DIAGNOSIS — R9431 Abnormal electrocardiogram [ECG] [EKG]: Secondary | ICD-10-CM | POA: Diagnosis not present

## 2016-03-15 DIAGNOSIS — Z940 Kidney transplant status: Secondary | ICD-10-CM | POA: Diagnosis not present

## 2016-03-15 DIAGNOSIS — I10 Essential (primary) hypertension: Secondary | ICD-10-CM | POA: Diagnosis not present

## 2016-03-15 DIAGNOSIS — D631 Anemia in chronic kidney disease: Secondary | ICD-10-CM | POA: Diagnosis not present

## 2016-03-15 DIAGNOSIS — E785 Hyperlipidemia, unspecified: Secondary | ICD-10-CM | POA: Diagnosis not present

## 2016-03-15 DIAGNOSIS — E039 Hypothyroidism, unspecified: Secondary | ICD-10-CM | POA: Diagnosis not present

## 2016-03-15 DIAGNOSIS — N189 Chronic kidney disease, unspecified: Secondary | ICD-10-CM | POA: Diagnosis not present

## 2016-03-15 DIAGNOSIS — D6959 Other secondary thrombocytopenia: Secondary | ICD-10-CM | POA: Diagnosis not present

## 2016-03-15 DIAGNOSIS — I480 Paroxysmal atrial fibrillation: Secondary | ICD-10-CM | POA: Diagnosis not present

## 2016-03-15 DIAGNOSIS — E119 Type 2 diabetes mellitus without complications: Secondary | ICD-10-CM | POA: Diagnosis not present

## 2016-03-15 DIAGNOSIS — I2510 Atherosclerotic heart disease of native coronary artery without angina pectoris: Secondary | ICD-10-CM | POA: Diagnosis not present

## 2016-03-15 DIAGNOSIS — I6529 Occlusion and stenosis of unspecified carotid artery: Secondary | ICD-10-CM | POA: Diagnosis not present

## 2016-03-20 ENCOUNTER — Encounter: Payer: Self-pay | Admitting: Physician Assistant

## 2016-03-20 ENCOUNTER — Ambulatory Visit (INDEPENDENT_AMBULATORY_CARE_PROVIDER_SITE_OTHER): Payer: Medicare Other | Admitting: Physician Assistant

## 2016-03-20 VITALS — BP 109/68 | HR 78 | Ht 72.0 in | Wt 230.1 lb

## 2016-03-20 DIAGNOSIS — Z954 Presence of other heart-valve replacement: Secondary | ICD-10-CM

## 2016-03-20 DIAGNOSIS — E785 Hyperlipidemia, unspecified: Secondary | ICD-10-CM | POA: Diagnosis not present

## 2016-03-20 DIAGNOSIS — I2510 Atherosclerotic heart disease of native coronary artery without angina pectoris: Secondary | ICD-10-CM | POA: Diagnosis not present

## 2016-03-20 DIAGNOSIS — I10 Essential (primary) hypertension: Secondary | ICD-10-CM

## 2016-03-20 DIAGNOSIS — Z940 Kidney transplant status: Secondary | ICD-10-CM

## 2016-03-20 DIAGNOSIS — Z952 Presence of prosthetic heart valve: Secondary | ICD-10-CM

## 2016-03-20 NOTE — Progress Notes (Signed)
Patient ID: [name] Brown, male   DOB: November 26, 1935, 80 y.o.   MRN: [id number]    Date:  03/20/2016   ID:  [name] Brown, DOB 06-Nov-1935, MRN [id number]  PCP:  Laurey Morale, MD  Primary Cardiologist:  [place]  Chief Complaint  Patient presents with  . Follow-up    no chest pain, no other compliants     History of Present Illness: [name] Brown is a 80 y.o. male history coronary bypass grafting times 03/23/2010, aortic valve replacement with pericardial tissue valve, kidney transplant thousand 13.  2-D echocardiogram 11/15/2015 revealed a normal ejection fraction normal wall motion. His moderate mitral valve stenosis, peak PA pressure 31 mmHg. Left atrium was moderately dilated and the right atrium mildly dilated.He is followed closely at [facility]. He actually moved to [facility] to be a smaller house. He did have a hospitalization as he accidentally overdosed with his insulin and had hypoglycemia.Marland Kitchen He doesn't walk outside because of his multiple skin cancers.   Patient was seen in [facility] in the first part of May with chest pain. Was postprandial and he reported eating some chili the night before. He had a normal echocardiogram while he was hospitalized. He was diagnosed with GERD and discharged.   He presents for six-month evaluation and posthospital follow-up. Reports not having any further episodes of chest pain and is at baseline. He does get some dizziness upon standing which is typical. His weight is stable.  The patient currently denies nausea, vomiting, fever, chest pain, shortness of breath, orthopnea, dizziness, PND, cough, congestion, abdominal pain, hematochezia, melena, lower extremity edema, claudication.  Wt Readings from Last 3 Encounters:  03/20/16 230 lb 2 oz (104.384 kg)  11/27/15 229 lb (103.874 kg)  11/03/15 228 lb 5 oz (103.562 kg)     Past Medical History  Diagnosis Date  . CAD (coronary artery disease)   . Hypertension   . Hyperlipidemia    . Myocardial infarction ([place])   . Diabetes mellitus   . Gout   . Obesity   . Chronic kidney disease     chronic renal failure  . Atrial fibrillation ([place])   . CVA (cerebral infarction) 05/2010  . Thrombocytopenia ([place]) 2011    sees Dr. Lamonte Sakai  . Splenomegaly 2012  . Anemia associated with chronic renal failure   . Aortic stenosis   . Anemia 01/02/2012  . Thrombocytopenia [facility])    Past Surgical History  Procedure Laterality Date  . Appendectomy    . Coronary artery bypass graft      June 2 011 Limited the LAD, SVG to OM, SVG to PDA  . Bone spurs      from right heel  . Aortic valve replacement      Pericardial tissue valve  . Kidney transplant  01-20-12    at [facility]     Current Outpatient Prescriptions  Medication Sig Dispense Refill  . allopurinol (ZYLOPRIM) 100 MG tablet TAKE 1 TABLET BY MOUTH EVERY DAY AS DIRECTED 30 tablet 3  . ALPRAZolam (XANAX) 0.5 MG tablet TAKE 1 TABLET EVERY 6 HOURS AS NEEDED 120 tablet 5  . amLODipine (NORVASC) 2.5 MG tablet Take 1 tablet by mouth daily. Take 1 tab daily    . aspirin 81 MG tablet Take 81 mg by mouth daily.    . B-D ULTRAFINE III SHORT PEN 31G X 8 MM MISC USE AS DIRECTED 100 each 0  . B-D ULTRAFINE III SHORT PEN 31G X 8 MM MISC USE AS  DIRECTED 100 each 1  . B-D ULTRAFINE III SHORT PEN 31G X 8 MM MISC USE AS DIRECTED TEST 3 TO 4 TIMES PER DAY 100 each 3  . calcium carbonate (TUMS - DOSED IN MG ELEMENTAL CALCIUM) 500 MG chewable tablet Chew 1 tablet by mouth daily.     . carvedilol (COREG) 12.5 MG tablet Take 1 tablet by mouth 2 (two) times daily.    . colchicine 0.6 MG tablet Take 0.6 mg by mouth daily as needed (gout).     Mariane Brown Sodium (COLACE PO) Take 1 capsule by mouth 2 (two) times daily.     . ergocalciferol (VITAMIN D2) 50000 units capsule Take 50,000 Units by mouth. Twice a week    . furosemide (LASIX) 40 MG tablet Take 40 mg by mouth 2 (two) times daily.     Marland Kitchen gabapentin (NEURONTIN) 100 MG capsule TAKE ONE CAPSULE  3 TIMES A DAY 90 capsule 3  . glucose blood (ONE TOUCH ULTRA TEST) test strip Diagnosis code is E 11.9 100 each 5  . HYDROcodone-homatropine (HYDROMET) 5-1.5 MG/5ML syrup Take 5 mLs by mouth every 4 (four) hours as needed. 240 mL 0  . insulin aspart (NOVOLOG FLEXPEN) 100 UNIT/ML FlexPen Inject into the skin. Was not given any directions on how many units to take    . Insulin Glargine (LANTUS SOLOSTAR) 100 UNIT/ML Solostar Pen 30 units at bedtime 15 mL 5  . Krill Oil 1000 MG CAPS Take 1,000 mg by mouth daily.    Marland Kitchen lactulose (CHRONULAC) 10 GM/15ML solution Take 10 g by mouth as needed for mild constipation.     Marland Kitchen levothyroxine (SYNTHROID, LEVOTHROID) 25 MCG tablet Take 25 mcg by mouth daily.     . metFORMIN (GLUCOPHAGE) 1000 MG tablet TAKE 1 TABLET BY MOUTH TWICE A DAY WITH A MEAL 180 tablet 0  . mycophenolate (MYFORTIC) 180 MG EC tablet Take 1 tablet (180 mg total) by mouth 2 (two) times daily. 2 tablet 0  . omeprazole (PRILOSEC) 20 MG capsule Take 20 mg by mouth daily.     Marland Kitchen oxyCODONE (OXY IR/ROXICODONE) 5 MG immediate release tablet Take 1 tablet (5 mg total) by mouth every 6 (six) hours as needed for moderate pain. 120 tablet 0  . simvastatin (ZOCOR) 10 MG tablet Take 1 tablet (10 mg total) by mouth at bedtime. 1 tablet 0  . sulfamethoxazole-trimethoprim (BACTRIM,SEPTRA) 400-80 MG per tablet Take 1 tablet by mouth as directed. Monday Wednesday and friday    . tacrolimus (PROGRAF) 1 MG capsule Take 3 capsules (3 mg total) by mouth 2 (two) times daily. (Patient taking differently: Take 1 mg by mouth 2 (two) times daily. Take 3 in AM and 2 in PM) 6 capsule 0  . temazepam (RESTORIL) 30 MG capsule TAKE ONE CAPSULE BY MOUTH AT BEDTIME AS NEEDED **FILL 12/22/14** 90 capsule 3  . Testosterone (ANDROGEL PUMP) 20.25 MG/ACT (1.62%) GEL Place 4 Act onto the skin daily. 75 g 3  . traMADol (ULTRAM) 50 MG tablet TAKE 2 TABLETS BY MOUTH EVERY 6 HOURS AS NEEDED FOR MODERATE PAIN 240 tablet 1   No current  facility-administered medications for this visit.    Allergies:    Allergies  Allergen Reactions  . Penicillins     REACTION: hives    Social History:  The patient  reports that he has never smoked. He has never used smokeless tobacco. He reports that he does not drink alcohol or use illicit drugs.   Family history:  Family History  Problem Relation Age of Onset  . Liver disease Father   . Cancer      colon/fhx  . Hypertension      fhx  . Coronary artery disease      fhx    ROS:  Please see the history of present illness.  All other systems reviewed and negative.   PHYSICAL EXAM: VS:  BP 109/68 mmHg  Pulse 78  Ht 6' (1.829 m)  Wt 230 lb 2 oz (104.384 kg)  BMI 31.20 kg/m2 Obese, well developed, in no acute distress HEENT: Pupils are equal round react to light accommodation extraocular movements are intact.  Neck: no JVDNo cervical lymphadenopathy. Cardiac: Regular rate and rhythm with soft 1/6 systolic murmur. Lungs:  clear to auscultation bilaterally, no wheezing, rhonchi or rales Abd: soft, nontender, positive bowel sounds all quadrants, no hepatosplenomegaly Ext: no lower extremity edema.  2+ radial and dorsalis pedis pulses. Skin: warm and dry Neuro:  Grossly normal    ASSESSMENT AND PLAN:  Problem List Items Addressed This Visit    S/P aortic valve replacement   Hyperlipidemia   H/O kidney transplant - Primary (Chronic)   Essential hypertension   Coronary atherosclerosis     Mr. Hardie Pulley appeared to be doing well. An episode noncardiac chest pain which was thought to be GERD was evaluated at [facility] in the early part of May. He had a normal echocardiogram and was discharged. He's had no recurrence of chest pain. His blood pressure is well-controlled. Continue on aspirin 81 mg, Coreg 12.5 mg twice daily, Lasix 40 mg twice daily. He takes [employer] and Zocor for his cholesterol. His weight is stable and appears euvolemic. Follow-up in 6 months with Dr.  Percival Spanish

## 2016-03-20 NOTE — Patient Instructions (Signed)
Medication Instructions:  NO CHANGES TO MEDS  Labwork: NONE  Testing/Procedures: NONE  Follow-Up: 6 MONTHS WITH DR [facility]  Any Other Special Instructions Will Be Listed Below (If Applicable).     If you need a refill on your cardiac medications before your next appointment, please call your pharmacy.

## 2016-03-25 DIAGNOSIS — M7501 Adhesive capsulitis of right shoulder: Secondary | ICD-10-CM | POA: Diagnosis not present

## 2016-03-25 DIAGNOSIS — M19012 Primary osteoarthritis, left shoulder: Secondary | ICD-10-CM | POA: Diagnosis not present

## 2016-03-25 DIAGNOSIS — M19011 Primary osteoarthritis, right shoulder: Secondary | ICD-10-CM | POA: Diagnosis not present

## 2016-03-25 DIAGNOSIS — M7502 Adhesive capsulitis of left shoulder: Secondary | ICD-10-CM | POA: Diagnosis not present

## 2016-03-25 DIAGNOSIS — M25512 Pain in left shoulder: Secondary | ICD-10-CM | POA: Diagnosis not present

## 2016-03-25 DIAGNOSIS — M25511 Pain in right shoulder: Secondary | ICD-10-CM | POA: Diagnosis not present

## 2016-03-26 ENCOUNTER — Encounter: Payer: Self-pay | Admitting: Family Medicine

## 2016-03-26 ENCOUNTER — Ambulatory Visit (INDEPENDENT_AMBULATORY_CARE_PROVIDER_SITE_OTHER): Payer: Medicare Other | Admitting: Family Medicine

## 2016-03-26 VITALS — BP 128/66 | HR 62 | Temp 98.4°F | Ht 72.0 in | Wt 227.0 lb

## 2016-03-26 DIAGNOSIS — I5022 Chronic systolic (congestive) heart failure: Secondary | ICD-10-CM | POA: Diagnosis not present

## 2016-03-26 DIAGNOSIS — Z794 Long term (current) use of insulin: Secondary | ICD-10-CM

## 2016-03-26 DIAGNOSIS — E1122 Type 2 diabetes mellitus with diabetic chronic kidney disease: Secondary | ICD-10-CM

## 2016-03-26 DIAGNOSIS — I2510 Atherosclerotic heart disease of native coronary artery without angina pectoris: Secondary | ICD-10-CM

## 2016-03-26 DIAGNOSIS — Z940 Kidney transplant status: Secondary | ICD-10-CM | POA: Diagnosis not present

## 2016-03-26 DIAGNOSIS — E291 Testicular hypofunction: Secondary | ICD-10-CM | POA: Diagnosis not present

## 2016-03-26 MED ORDER — METFORMIN HCL 1000 MG PO TABS: ORAL_TABLET | ORAL | Status: DC

## 2016-03-26 MED ORDER — ALLOPURINOL 100 MG PO TABS: 200.0000 mg | ORAL_TABLET | Freq: Two times a day (BID) | ORAL | Status: DC

## 2016-03-26 NOTE — Progress Notes (Signed)
   Subjective:    Patient ID: [name] Brown, male    DOB: 1936/03/06, 80 y.o.   MRN: [id number]  HPI Here to follow up. He seems to be doing well. He had an ER visit for chest pain a few weeks ago and the workup was negative,so this was felt to be the result of GERD. In fact he has had no further problems since then. He saw Cardiology and Nephrology in the past few weeks, and everyting seems to be fine. His A1c on 03-04-16 was 5.8, and his creatinine on 03-01-16 was 1.3. He used Androgel for about 2 months by applying to the left shoulder every day. He developed pain in that shoulder and wound getting a steroid injection to the shoulder per Dr. Nickola Major (Orthopedics in [place]) which was helpful. [name] Brown is convinced the Androgel was the source of the pain so he does not want to try this again.    Review of Systems  Constitutional: Negative.   Respiratory: Negative.   Cardiovascular: Negative.   Genitourinary: Negative.   Neurological: Negative.        Objective:   Physical Exam  Constitutional: He is oriented to person, place, and time. He appears well-developed and well-nourished.  Neck: No thyromegaly present.  Cardiovascular: Normal rate, regular rhythm, normal heart sounds and intact distal pulses.   Pulmonary/Chest: Effort normal and breath sounds normal.  Musculoskeletal: He exhibits no edema.  Lymphadenopathy:    He has no cervical adenopathy.  Neurological: He is alert and oriented to person, place, and time.          Assessment & Plan:  He is doing well. He will stay on current meds. He decided not to treat the low testosterone at this time.  Laurey Morale, MD

## 2016-03-26 NOTE — Progress Notes (Signed)
Pre visit review using our clinic review tool, if applicable. No additional management support is needed unless otherwise documented below in the visit note. 

## 2016-04-09 DIAGNOSIS — Z940 Kidney transplant status: Secondary | ICD-10-CM | POA: Diagnosis not present

## 2016-04-30 ENCOUNTER — Telehealth: Payer: Self-pay | Admitting: *Deleted

## 2016-04-30 NOTE — Telephone Encounter (Signed)
Received fax from [facility] pt requesting refill on Alprazolam 0.5 mg tablet.

## 2016-05-01 MED ORDER — ALPRAZOLAM 0.5 MG PO TABS: 0.5000 mg | ORAL_TABLET | Freq: Four times a day (QID) | ORAL | Status: DC | PRN

## 2016-05-01 NOTE — Telephone Encounter (Signed)
Ok to refill for 30 days  

## 2016-05-01 NOTE — Telephone Encounter (Signed)
I called in script 

## 2016-05-08 DIAGNOSIS — E785 Hyperlipidemia, unspecified: Secondary | ICD-10-CM | POA: Diagnosis not present

## 2016-05-08 DIAGNOSIS — Z940 Kidney transplant status: Secondary | ICD-10-CM | POA: Diagnosis not present

## 2016-05-08 DIAGNOSIS — E119 Type 2 diabetes mellitus without complications: Secondary | ICD-10-CM | POA: Diagnosis not present

## 2016-05-08 DIAGNOSIS — M109 Gout, unspecified: Secondary | ICD-10-CM | POA: Diagnosis not present

## 2016-05-08 DIAGNOSIS — N2581 Secondary hyperparathyroidism of renal origin: Secondary | ICD-10-CM | POA: Diagnosis not present

## 2016-05-08 DIAGNOSIS — E039 Hypothyroidism, unspecified: Secondary | ICD-10-CM | POA: Diagnosis not present

## 2016-05-24 ENCOUNTER — Ambulatory Visit (INDEPENDENT_AMBULATORY_CARE_PROVIDER_SITE_OTHER): Payer: Medicare Other | Admitting: Family Medicine

## 2016-05-24 ENCOUNTER — Encounter: Payer: Self-pay | Admitting: Family Medicine

## 2016-05-24 VITALS — BP 136/66 | HR 76 | Temp 98.8°F | Ht 72.0 in | Wt 226.0 lb

## 2016-05-24 DIAGNOSIS — E213 Hyperparathyroidism, unspecified: Secondary | ICD-10-CM | POA: Insufficient documentation

## 2016-05-24 DIAGNOSIS — M7502 Adhesive capsulitis of left shoulder: Secondary | ICD-10-CM | POA: Diagnosis not present

## 2016-05-24 DIAGNOSIS — I2510 Atherosclerotic heart disease of native coronary artery without angina pectoris: Secondary | ICD-10-CM

## 2016-05-24 DIAGNOSIS — M7542 Impingement syndrome of left shoulder: Secondary | ICD-10-CM | POA: Diagnosis not present

## 2016-05-24 DIAGNOSIS — M7541 Impingement syndrome of right shoulder: Secondary | ICD-10-CM | POA: Diagnosis not present

## 2016-05-24 DIAGNOSIS — M7501 Adhesive capsulitis of right shoulder: Secondary | ICD-10-CM | POA: Diagnosis not present

## 2016-05-24 DIAGNOSIS — H6123 Impacted cerumen, bilateral: Secondary | ICD-10-CM | POA: Diagnosis not present

## 2016-05-24 DIAGNOSIS — J209 Acute bronchitis, unspecified: Secondary | ICD-10-CM | POA: Diagnosis not present

## 2016-05-24 MED ORDER — HYDROCODONE-HOMATROPINE 5-1.5 MG/5ML PO SYRP: 5.0000 mL | ORAL_SOLUTION | ORAL | 0 refills | Status: DC | PRN

## 2016-05-24 MED ORDER — CEFUROXIME AXETIL 500 MG PO TABS: 500.0000 mg | ORAL_TABLET | Freq: Two times a day (BID) | ORAL | 0 refills | Status: DC

## 2016-05-24 NOTE — Progress Notes (Signed)
   Subjective:    Patient ID: [name] Brown, male    DOB: Mar 18, 1936, 80 y.o.   MRN: [id number]  HPI Here for 5 days of chest congestion and coughing up yellow sputum. No fever or SOB. Also his recent labs at the Nephrology office showed an intact PTH is elevated to 111 while his serum calcium level remains normal at 8.8. It was decided to monitor this for now.    Review of Systems  Constitutional: Negative.   HENT: Positive for congestion and hearing loss. Negative for ear discharge, ear pain, postnasal drip, sinus pressure and sore throat.   Eyes: Negative.   Respiratory: Positive for cough and chest tightness. Negative for shortness of breath and wheezing.   Cardiovascular: Negative.   Neurological: Negative.        Objective:   Physical Exam  Constitutional: He appears well-developed and well-nourished. No distress.  HENT:  Nose: Nose normal.  Mouth/Throat: Oropharynx is clear and moist.  Both ear canals are full of cerumen   Eyes: Conjunctivae are normal.  Neck: Neck supple. No thyromegaly present.  Cardiovascular: Normal rate, regular rhythm, normal heart sounds and intact distal pulses.   Pulmonary/Chest: Effort normal. No respiratory distress. He has no wheezes. He has no rales.  Scattered rhonchi   Lymphadenopathy:    He has no cervical adenopathy.          Assessment & Plan:  Treat the bronchitis with Ceftin and Mucinex. The ear canals were irrigated clear with water. He appears to ave subclinical hyperparathyroidism. We will watch this closely.  Laurey Morale, MD

## 2016-05-24 NOTE — Progress Notes (Signed)
Pre visit review using our clinic review tool, if applicable. No additional management support is needed unless otherwise documented below in the visit note. 

## 2016-06-01 ENCOUNTER — Other Ambulatory Visit: Payer: Self-pay | Admitting: Family Medicine

## 2016-06-03 ENCOUNTER — Other Ambulatory Visit: Payer: Self-pay | Admitting: Family Medicine

## 2016-06-05 NOTE — Telephone Encounter (Signed)
Pt states he is not completely well and not getting over the bronchitis. Would like to know if he should get another round of abx  Pt request refill  temazepam (RESTORIL) 30 MG capsule  Pharmacy states since this is a controlled substance, the remaining refills are not allowed.  [facility] [address] [place]

## 2016-06-06 MED ORDER — CLARITHROMYCIN 500 MG PO TABS: 500.0000 mg | ORAL_TABLET | Freq: Two times a day (BID) | ORAL | 0 refills | Status: DC

## 2016-06-06 MED ORDER — TEMAZEPAM 30 MG PO CAPS: ORAL_CAPSULE | ORAL | 1 refills | Status: DC

## 2016-06-06 NOTE — Telephone Encounter (Signed)
Refill the Temazepam, call in #[phone] with one rf. As for the antibiotic, instead of more Ceftin this time call in Biaxin 500 mg bid for 10 days

## 2016-06-06 NOTE — Telephone Encounter (Signed)
[facility] and spoke with [facility].

## 2016-06-07 NOTE — Telephone Encounter (Signed)
Since the pharmacy said that Biaxin would interact with his other medications, we sent in another 10 days of Ceftin instead.

## 2016-06-07 NOTE — Telephone Encounter (Signed)
Pharmacy is aware

## 2016-06-10 DIAGNOSIS — M19012 Primary osteoarthritis, left shoulder: Secondary | ICD-10-CM | POA: Diagnosis not present

## 2016-06-10 DIAGNOSIS — M7582 Other shoulder lesions, left shoulder: Secondary | ICD-10-CM | POA: Diagnosis not present

## 2016-06-12 DIAGNOSIS — Z940 Kidney transplant status: Secondary | ICD-10-CM | POA: Diagnosis not present

## 2016-06-12 DIAGNOSIS — E039 Hypothyroidism, unspecified: Secondary | ICD-10-CM | POA: Diagnosis not present

## 2016-06-17 ENCOUNTER — Other Ambulatory Visit: Payer: Self-pay

## 2016-06-19 DIAGNOSIS — Z85828 Personal history of other malignant neoplasm of skin: Secondary | ICD-10-CM | POA: Diagnosis not present

## 2016-06-19 DIAGNOSIS — I6529 Occlusion and stenosis of unspecified carotid artery: Secondary | ICD-10-CM | POA: Diagnosis not present

## 2016-06-19 DIAGNOSIS — I480 Paroxysmal atrial fibrillation: Secondary | ICD-10-CM | POA: Diagnosis not present

## 2016-06-19 DIAGNOSIS — I2510 Atherosclerotic heart disease of native coronary artery without angina pectoris: Secondary | ICD-10-CM | POA: Diagnosis not present

## 2016-06-19 DIAGNOSIS — Z940 Kidney transplant status: Secondary | ICD-10-CM | POA: Diagnosis not present

## 2016-06-19 DIAGNOSIS — D225 Melanocytic nevi of trunk: Secondary | ICD-10-CM | POA: Diagnosis not present

## 2016-06-19 DIAGNOSIS — D6959 Other secondary thrombocytopenia: Secondary | ICD-10-CM | POA: Diagnosis not present

## 2016-06-19 DIAGNOSIS — L814 Other melanin hyperpigmentation: Secondary | ICD-10-CM | POA: Diagnosis not present

## 2016-06-19 DIAGNOSIS — E119 Type 2 diabetes mellitus without complications: Secondary | ICD-10-CM | POA: Diagnosis not present

## 2016-06-19 DIAGNOSIS — I10 Essential (primary) hypertension: Secondary | ICD-10-CM | POA: Diagnosis not present

## 2016-06-19 DIAGNOSIS — E785 Hyperlipidemia, unspecified: Secondary | ICD-10-CM | POA: Diagnosis not present

## 2016-06-19 DIAGNOSIS — D631 Anemia in chronic kidney disease: Secondary | ICD-10-CM | POA: Diagnosis not present

## 2016-06-19 DIAGNOSIS — D485 Neoplasm of uncertain behavior of skin: Secondary | ICD-10-CM | POA: Diagnosis not present

## 2016-06-19 DIAGNOSIS — N2581 Secondary hyperparathyroidism of renal origin: Secondary | ICD-10-CM | POA: Diagnosis not present

## 2016-06-19 DIAGNOSIS — N189 Chronic kidney disease, unspecified: Secondary | ICD-10-CM | POA: Diagnosis not present

## 2016-06-19 DIAGNOSIS — E559 Vitamin D deficiency, unspecified: Secondary | ICD-10-CM | POA: Diagnosis not present

## 2016-06-19 DIAGNOSIS — L570 Actinic keratosis: Secondary | ICD-10-CM | POA: Diagnosis not present

## 2016-06-19 DIAGNOSIS — L821 Other seborrheic keratosis: Secondary | ICD-10-CM | POA: Diagnosis not present

## 2016-06-19 DIAGNOSIS — E039 Hypothyroidism, unspecified: Secondary | ICD-10-CM | POA: Diagnosis not present

## 2016-06-25 DIAGNOSIS — L570 Actinic keratosis: Secondary | ICD-10-CM | POA: Diagnosis not present

## 2016-06-25 DIAGNOSIS — L821 Other seborrheic keratosis: Secondary | ICD-10-CM | POA: Diagnosis not present

## 2016-07-09 DIAGNOSIS — Z940 Kidney transplant status: Secondary | ICD-10-CM | POA: Diagnosis not present

## 2016-07-09 DIAGNOSIS — E039 Hypothyroidism, unspecified: Secondary | ICD-10-CM | POA: Diagnosis not present

## 2016-07-23 ENCOUNTER — Ambulatory Visit (INDEPENDENT_AMBULATORY_CARE_PROVIDER_SITE_OTHER): Payer: Medicare Other | Admitting: Family Medicine

## 2016-07-23 DIAGNOSIS — L820 Inflamed seborrheic keratosis: Secondary | ICD-10-CM | POA: Diagnosis not present

## 2016-07-23 DIAGNOSIS — L570 Actinic keratosis: Secondary | ICD-10-CM | POA: Diagnosis not present

## 2016-07-23 DIAGNOSIS — Z23 Encounter for immunization: Secondary | ICD-10-CM | POA: Diagnosis not present

## 2016-07-23 DIAGNOSIS — L821 Other seborrheic keratosis: Secondary | ICD-10-CM | POA: Diagnosis not present

## 2016-07-24 ENCOUNTER — Other Ambulatory Visit: Payer: Self-pay | Admitting: Family Medicine

## 2016-08-08 DIAGNOSIS — Z940 Kidney transplant status: Secondary | ICD-10-CM | POA: Diagnosis not present

## 2016-08-08 DIAGNOSIS — M109 Gout, unspecified: Secondary | ICD-10-CM | POA: Diagnosis not present

## 2016-08-16 DIAGNOSIS — Z940 Kidney transplant status: Secondary | ICD-10-CM | POA: Diagnosis not present

## 2016-08-18 ENCOUNTER — Telehealth: Payer: Self-pay | Admitting: Family Medicine

## 2016-08-18 DIAGNOSIS — E291 Testicular hypofunction: Secondary | ICD-10-CM

## 2016-08-19 NOTE — Telephone Encounter (Signed)
Did not see a recent testosterone level?

## 2016-08-20 NOTE — Telephone Encounter (Signed)
He needs to have a level drawn first. I placed the order, so he can schedule the draw

## 2016-08-20 NOTE — Telephone Encounter (Signed)
lmom for pt to call office back to schedule appt.

## 2016-08-24 ENCOUNTER — Other Ambulatory Visit: Payer: Self-pay | Admitting: Family Medicine

## 2016-09-05 ENCOUNTER — Ambulatory Visit: Payer: Medicare Other

## 2016-09-05 ENCOUNTER — Ambulatory Visit (INDEPENDENT_AMBULATORY_CARE_PROVIDER_SITE_OTHER): Payer: Medicare Other

## 2016-09-05 DIAGNOSIS — Z6832 Body mass index (BMI) 32.0-32.9, adult: Secondary | ICD-10-CM | POA: Diagnosis not present

## 2016-09-05 DIAGNOSIS — N189 Chronic kidney disease, unspecified: Secondary | ICD-10-CM | POA: Diagnosis not present

## 2016-09-05 DIAGNOSIS — R9431 Abnormal electrocardiogram [ECG] [EKG]: Secondary | ICD-10-CM | POA: Diagnosis not present

## 2016-09-05 DIAGNOSIS — I6529 Occlusion and stenosis of unspecified carotid artery: Secondary | ICD-10-CM | POA: Diagnosis not present

## 2016-09-05 DIAGNOSIS — E785 Hyperlipidemia, unspecified: Secondary | ICD-10-CM | POA: Diagnosis not present

## 2016-09-05 DIAGNOSIS — E559 Vitamin D deficiency, unspecified: Secondary | ICD-10-CM | POA: Diagnosis not present

## 2016-09-05 DIAGNOSIS — D631 Anemia in chronic kidney disease: Secondary | ICD-10-CM | POA: Diagnosis not present

## 2016-09-05 DIAGNOSIS — E119 Type 2 diabetes mellitus without complications: Secondary | ICD-10-CM | POA: Diagnosis not present

## 2016-09-05 DIAGNOSIS — Z23 Encounter for immunization: Secondary | ICD-10-CM | POA: Diagnosis not present

## 2016-09-05 DIAGNOSIS — Z940 Kidney transplant status: Secondary | ICD-10-CM | POA: Diagnosis not present

## 2016-09-05 DIAGNOSIS — I10 Essential (primary) hypertension: Secondary | ICD-10-CM | POA: Diagnosis not present

## 2016-09-05 DIAGNOSIS — E039 Hypothyroidism, unspecified: Secondary | ICD-10-CM | POA: Diagnosis not present

## 2016-09-05 DIAGNOSIS — N2581 Secondary hyperparathyroidism of renal origin: Secondary | ICD-10-CM | POA: Diagnosis not present

## 2016-09-10 DIAGNOSIS — E291 Testicular hypofunction: Secondary | ICD-10-CM | POA: Diagnosis not present

## 2016-09-10 DIAGNOSIS — E119 Type 2 diabetes mellitus without complications: Secondary | ICD-10-CM | POA: Diagnosis not present

## 2016-09-10 DIAGNOSIS — E785 Hyperlipidemia, unspecified: Secondary | ICD-10-CM | POA: Diagnosis not present

## 2016-09-10 DIAGNOSIS — D649 Anemia, unspecified: Secondary | ICD-10-CM | POA: Diagnosis not present

## 2016-09-10 DIAGNOSIS — N2581 Secondary hyperparathyroidism of renal origin: Secondary | ICD-10-CM | POA: Diagnosis not present

## 2016-09-10 DIAGNOSIS — Z940 Kidney transplant status: Secondary | ICD-10-CM | POA: Diagnosis not present

## 2016-09-10 DIAGNOSIS — E039 Hypothyroidism, unspecified: Secondary | ICD-10-CM | POA: Diagnosis not present

## 2016-09-20 ENCOUNTER — Other Ambulatory Visit: Payer: Self-pay | Admitting: Family Medicine

## 2016-10-07 DIAGNOSIS — Z940 Kidney transplant status: Secondary | ICD-10-CM | POA: Diagnosis not present

## 2016-10-07 DIAGNOSIS — E039 Hypothyroidism, unspecified: Secondary | ICD-10-CM | POA: Diagnosis not present

## 2016-10-17 DIAGNOSIS — Z940 Kidney transplant status: Secondary | ICD-10-CM | POA: Diagnosis not present

## 2016-11-03 ENCOUNTER — Other Ambulatory Visit: Payer: Self-pay | Admitting: Family Medicine

## 2016-11-04 NOTE — Telephone Encounter (Signed)
Call in #[phone] with one rf

## 2016-11-08 ENCOUNTER — Other Ambulatory Visit: Payer: Self-pay | Admitting: Family Medicine

## 2016-11-11 DIAGNOSIS — M109 Gout, unspecified: Secondary | ICD-10-CM | POA: Diagnosis not present

## 2016-11-11 DIAGNOSIS — Z940 Kidney transplant status: Secondary | ICD-10-CM | POA: Diagnosis not present

## 2016-11-11 DIAGNOSIS — D631 Anemia in chronic kidney disease: Secondary | ICD-10-CM | POA: Diagnosis not present

## 2016-11-25 ENCOUNTER — Other Ambulatory Visit: Payer: Self-pay | Admitting: Family Medicine

## 2016-11-25 DIAGNOSIS — E785 Hyperlipidemia, unspecified: Secondary | ICD-10-CM | POA: Diagnosis not present

## 2016-11-25 DIAGNOSIS — Z940 Kidney transplant status: Secondary | ICD-10-CM | POA: Diagnosis not present

## 2016-11-25 DIAGNOSIS — E039 Hypothyroidism, unspecified: Secondary | ICD-10-CM | POA: Diagnosis not present

## 2016-11-25 DIAGNOSIS — E119 Type 2 diabetes mellitus without complications: Secondary | ICD-10-CM | POA: Diagnosis not present

## 2016-11-26 NOTE — Telephone Encounter (Signed)
Call in #90 with one rf 

## 2016-11-30 DIAGNOSIS — I10 Essential (primary) hypertension: Secondary | ICD-10-CM | POA: Diagnosis not present

## 2016-11-30 DIAGNOSIS — R1110 Vomiting, unspecified: Secondary | ICD-10-CM | POA: Diagnosis not present

## 2016-11-30 DIAGNOSIS — E860 Dehydration: Secondary | ICD-10-CM | POA: Diagnosis not present

## 2016-11-30 DIAGNOSIS — R112 Nausea with vomiting, unspecified: Secondary | ICD-10-CM | POA: Diagnosis not present

## 2016-12-02 DIAGNOSIS — Z940 Kidney transplant status: Secondary | ICD-10-CM | POA: Diagnosis not present

## 2016-12-02 DIAGNOSIS — K529 Noninfective gastroenteritis and colitis, unspecified: Secondary | ICD-10-CM | POA: Diagnosis not present

## 2016-12-04 ENCOUNTER — Ambulatory Visit (INDEPENDENT_AMBULATORY_CARE_PROVIDER_SITE_OTHER): Payer: Medicare Other | Admitting: Family Medicine

## 2016-12-04 ENCOUNTER — Encounter: Payer: Self-pay | Admitting: Family Medicine

## 2016-12-04 VITALS — BP 108/73 | HR 72 | Temp 97.9°F | Ht 72.0 in | Wt 232.0 lb

## 2016-12-04 DIAGNOSIS — R42 Dizziness and giddiness: Secondary | ICD-10-CM

## 2016-12-04 DIAGNOSIS — R112 Nausea with vomiting, unspecified: Secondary | ICD-10-CM

## 2016-12-04 MED ORDER — METHYLPREDNISOLONE ACETATE 80 MG/ML IJ SUSP
120.0000 mg | Freq: Once | INTRAMUSCULAR | Status: AC
  Administered 2016-12-04: 120 mg via INTRAMUSCULAR

## 2016-12-04 NOTE — Progress Notes (Signed)
   Subjective:    Patient ID: [name] Brown, male    DOB: 04-29-1936, 81 y.o.   MRN: [id number]  HPI Here to follow up on a week of nausea and vomiting which until now had been felt to be from a gastroenteritis. This started while he was traveling in [place], [place] and he was seen an an urgent care clinic there on 11-29-16. He was given IV fluids and told to follow up as needed. Then after he got home he was not better, so he went to a [facility] urgent care clinic on 12-02-16. These he had labs drawn which revealed a normal WBC of 7.1, a stable Hgb of 11.9, and a stable creatinine of 1.8. He was again treated for a gastroenteritis and given Zofran. He is here today because he about the same as before. He still has intermittent nausea though with less vomiting, but he denies any fever or diarrhea or abdominal cramps through this whole episode. He does describe pressure in the head and dizziness, especially when he moves his head quickly. No headache.    Review of Systems  Constitutional: Negative.   HENT: Positive for congestion. Negative for ear pain, postnasal drip, sinus pain, sinus pressure and sore throat.   Eyes: Negative.   Respiratory: Negative.   Cardiovascular: Negative.   Gastrointestinal: Positive for nausea and vomiting. Negative for abdominal distention, abdominal pain, anal bleeding, blood in stool, constipation, diarrhea and rectal pain.  Genitourinary: Negative.   Neurological: Positive for dizziness. Negative for tremors, seizures, syncope, facial asymmetry, speech difficulty, weakness, light-headedness, numbness and headaches.       Objective:   Physical Exam  Constitutional: He is oriented to person, place, and time. He appears well-developed and well-nourished. No distress.  HENT:  Right Ear: External ear normal.  Left Ear: External ear normal.  Nose: Nose normal.  Mouth/Throat: Oropharynx is clear and moist.  Eyes: Conjunctivae and EOM are normal. Pupils are equal, round,  and reactive to light.  Neck: Neck supple. No thyromegaly present.  Cardiovascular: Normal rate, regular rhythm, normal heart sounds and intact distal pulses.   Pulmonary/Chest: Effort normal and breath sounds normal.  Abdominal: Soft. Bowel sounds are normal. He exhibits no distension and no mass. There is no tenderness. There is no rebound and no guarding.  Neurological: He is alert and oriented to person, place, and time. No cranial nerve deficit. He exhibits normal muscle tone. Coordination normal.          Assessment & Plan:  This is consistent with vestibular dysfunction and this would explain his nausea. This has never been a GI issue. He is given a DepoMedrol shot today and he will begin taking Mucinex and Zyrtec daily. He can still use the Zofran for nausea. Recheck prn.  Alysia Penna, MD

## 2016-12-04 NOTE — Progress Notes (Signed)
Pre visit review using our clinic review tool, if applicable. No additional management support is needed unless otherwise documented below in the visit note. 

## 2016-12-05 ENCOUNTER — Other Ambulatory Visit: Payer: Self-pay | Admitting: Family Medicine

## 2016-12-06 ENCOUNTER — Other Ambulatory Visit: Payer: Self-pay | Admitting: Family Medicine

## 2016-12-09 DIAGNOSIS — E039 Hypothyroidism, unspecified: Secondary | ICD-10-CM | POA: Diagnosis not present

## 2016-12-09 DIAGNOSIS — D631 Anemia in chronic kidney disease: Secondary | ICD-10-CM | POA: Diagnosis not present

## 2016-12-09 DIAGNOSIS — Z940 Kidney transplant status: Secondary | ICD-10-CM | POA: Diagnosis not present

## 2016-12-11 DIAGNOSIS — E1122 Type 2 diabetes mellitus with diabetic chronic kidney disease: Secondary | ICD-10-CM | POA: Diagnosis not present

## 2016-12-11 DIAGNOSIS — I4891 Unspecified atrial fibrillation: Secondary | ICD-10-CM | POA: Diagnosis not present

## 2016-12-11 DIAGNOSIS — D631 Anemia in chronic kidney disease: Secondary | ICD-10-CM | POA: Diagnosis not present

## 2016-12-11 DIAGNOSIS — Z951 Presence of aortocoronary bypass graft: Secondary | ICD-10-CM | POA: Diagnosis not present

## 2016-12-11 DIAGNOSIS — I517 Cardiomegaly: Secondary | ICD-10-CM | POA: Diagnosis not present

## 2016-12-11 DIAGNOSIS — Z794 Long term (current) use of insulin: Secondary | ICD-10-CM | POA: Diagnosis not present

## 2016-12-11 DIAGNOSIS — Z953 Presence of xenogenic heart valve: Secondary | ICD-10-CM | POA: Diagnosis not present

## 2016-12-11 DIAGNOSIS — I132 Hypertensive heart and chronic kidney disease with heart failure and with stage 5 chronic kidney disease, or end stage renal disease: Secondary | ICD-10-CM | POA: Diagnosis not present

## 2016-12-11 DIAGNOSIS — D696 Thrombocytopenia, unspecified: Secondary | ICD-10-CM | POA: Diagnosis not present

## 2016-12-11 DIAGNOSIS — I129 Hypertensive chronic kidney disease with stage 1 through stage 4 chronic kidney disease, or unspecified chronic kidney disease: Secondary | ICD-10-CM | POA: Diagnosis not present

## 2016-12-11 DIAGNOSIS — N2581 Secondary hyperparathyroidism of renal origin: Secondary | ICD-10-CM | POA: Diagnosis not present

## 2016-12-11 DIAGNOSIS — D6959 Other secondary thrombocytopenia: Secondary | ICD-10-CM | POA: Diagnosis not present

## 2016-12-11 DIAGNOSIS — I444 Left anterior fascicular block: Secondary | ICD-10-CM | POA: Diagnosis not present

## 2016-12-11 DIAGNOSIS — R7989 Other specified abnormal findings of blood chemistry: Secondary | ICD-10-CM | POA: Diagnosis not present

## 2016-12-11 DIAGNOSIS — Z6832 Body mass index (BMI) 32.0-32.9, adult: Secondary | ICD-10-CM | POA: Diagnosis not present

## 2016-12-11 DIAGNOSIS — I10 Essential (primary) hypertension: Secondary | ICD-10-CM | POA: Diagnosis not present

## 2016-12-11 DIAGNOSIS — E1121 Type 2 diabetes mellitus with diabetic nephropathy: Secondary | ICD-10-CM | POA: Diagnosis not present

## 2016-12-11 DIAGNOSIS — I2510 Atherosclerotic heart disease of native coronary artery without angina pectoris: Secondary | ICD-10-CM | POA: Diagnosis not present

## 2016-12-11 DIAGNOSIS — Z9889 Other specified postprocedural states: Secondary | ICD-10-CM | POA: Diagnosis not present

## 2016-12-11 DIAGNOSIS — E785 Hyperlipidemia, unspecified: Secondary | ICD-10-CM | POA: Diagnosis not present

## 2016-12-11 DIAGNOSIS — N189 Chronic kidney disease, unspecified: Secondary | ICD-10-CM | POA: Diagnosis not present

## 2016-12-11 DIAGNOSIS — I340 Nonrheumatic mitral (valve) insufficiency: Secondary | ICD-10-CM | POA: Diagnosis not present

## 2016-12-11 DIAGNOSIS — Z8673 Personal history of transient ischemic attack (TIA), and cerebral infarction without residual deficits: Secondary | ICD-10-CM | POA: Diagnosis not present

## 2016-12-11 DIAGNOSIS — Z952 Presence of prosthetic heart valve: Secondary | ICD-10-CM | POA: Diagnosis not present

## 2016-12-11 DIAGNOSIS — I480 Paroxysmal atrial fibrillation: Secondary | ICD-10-CM | POA: Diagnosis not present

## 2016-12-11 DIAGNOSIS — N186 End stage renal disease: Secondary | ICD-10-CM | POA: Diagnosis not present

## 2016-12-11 DIAGNOSIS — I272 Pulmonary hypertension, unspecified: Secondary | ICD-10-CM | POA: Diagnosis not present

## 2016-12-11 DIAGNOSIS — E119 Type 2 diabetes mellitus without complications: Secondary | ICD-10-CM | POA: Diagnosis not present

## 2016-12-11 DIAGNOSIS — Z7982 Long term (current) use of aspirin: Secondary | ICD-10-CM | POA: Diagnosis not present

## 2016-12-11 DIAGNOSIS — E039 Hypothyroidism, unspecified: Secondary | ICD-10-CM | POA: Diagnosis not present

## 2016-12-11 DIAGNOSIS — Z940 Kidney transplant status: Secondary | ICD-10-CM | POA: Diagnosis not present

## 2016-12-12 DIAGNOSIS — I480 Paroxysmal atrial fibrillation: Secondary | ICD-10-CM | POA: Diagnosis not present

## 2016-12-12 DIAGNOSIS — E1121 Type 2 diabetes mellitus with diabetic nephropathy: Secondary | ICD-10-CM | POA: Diagnosis not present

## 2016-12-12 DIAGNOSIS — N189 Chronic kidney disease, unspecified: Secondary | ICD-10-CM | POA: Diagnosis not present

## 2016-12-12 DIAGNOSIS — E039 Hypothyroidism, unspecified: Secondary | ICD-10-CM | POA: Diagnosis not present

## 2016-12-12 DIAGNOSIS — I129 Hypertensive chronic kidney disease with stage 1 through stage 4 chronic kidney disease, or unspecified chronic kidney disease: Secondary | ICD-10-CM | POA: Diagnosis not present

## 2016-12-12 DIAGNOSIS — I2510 Atherosclerotic heart disease of native coronary artery without angina pectoris: Secondary | ICD-10-CM | POA: Diagnosis not present

## 2016-12-12 DIAGNOSIS — M109 Gout, unspecified: Secondary | ICD-10-CM | POA: Diagnosis not present

## 2016-12-12 DIAGNOSIS — D631 Anemia in chronic kidney disease: Secondary | ICD-10-CM | POA: Diagnosis not present

## 2016-12-12 DIAGNOSIS — Z794 Long term (current) use of insulin: Secondary | ICD-10-CM | POA: Diagnosis not present

## 2016-12-12 DIAGNOSIS — I4891 Unspecified atrial fibrillation: Secondary | ICD-10-CM | POA: Diagnosis not present

## 2016-12-12 DIAGNOSIS — D696 Thrombocytopenia, unspecified: Secondary | ICD-10-CM | POA: Diagnosis not present

## 2016-12-12 DIAGNOSIS — R748 Abnormal levels of other serum enzymes: Secondary | ICD-10-CM | POA: Diagnosis not present

## 2016-12-12 DIAGNOSIS — E785 Hyperlipidemia, unspecified: Secondary | ICD-10-CM | POA: Diagnosis not present

## 2016-12-16 ENCOUNTER — Ambulatory Visit (INDEPENDENT_AMBULATORY_CARE_PROVIDER_SITE_OTHER): Payer: Medicare Other | Admitting: Cardiology

## 2016-12-16 ENCOUNTER — Telehealth: Payer: Self-pay | Admitting: Cardiology

## 2016-12-16 ENCOUNTER — Encounter: Payer: Self-pay | Admitting: Cardiology

## 2016-12-16 VITALS — BP 122/72 | HR 105 | Ht 72.0 in | Wt 234.4 lb

## 2016-12-16 DIAGNOSIS — E1121 Type 2 diabetes mellitus with diabetic nephropathy: Secondary | ICD-10-CM | POA: Diagnosis not present

## 2016-12-16 DIAGNOSIS — Z794 Long term (current) use of insulin: Secondary | ICD-10-CM

## 2016-12-16 DIAGNOSIS — I4891 Unspecified atrial fibrillation: Secondary | ICD-10-CM

## 2016-12-16 DIAGNOSIS — Z940 Kidney transplant status: Secondary | ICD-10-CM

## 2016-12-16 DIAGNOSIS — Z7901 Long term (current) use of anticoagulants: Secondary | ICD-10-CM

## 2016-12-16 DIAGNOSIS — L814 Other melanin hyperpigmentation: Secondary | ICD-10-CM | POA: Diagnosis not present

## 2016-12-16 DIAGNOSIS — L570 Actinic keratosis: Secondary | ICD-10-CM | POA: Diagnosis not present

## 2016-12-16 DIAGNOSIS — L821 Other seborrheic keratosis: Secondary | ICD-10-CM | POA: Diagnosis not present

## 2016-12-16 DIAGNOSIS — Z951 Presence of aortocoronary bypass graft: Secondary | ICD-10-CM | POA: Diagnosis not present

## 2016-12-16 DIAGNOSIS — D225 Melanocytic nevi of trunk: Secondary | ICD-10-CM | POA: Diagnosis not present

## 2016-12-16 DIAGNOSIS — D696 Thrombocytopenia, unspecified: Secondary | ICD-10-CM

## 2016-12-16 DIAGNOSIS — Z952 Presence of prosthetic heart valve: Secondary | ICD-10-CM | POA: Diagnosis not present

## 2016-12-16 DIAGNOSIS — L820 Inflamed seborrheic keratosis: Secondary | ICD-10-CM | POA: Diagnosis not present

## 2016-12-16 MED ORDER — DILTIAZEM HCL ER COATED BEADS 120 MG PO CP24: 120.0000 mg | ORAL_CAPSULE | Freq: Every day | ORAL | 3 refills | Status: DC

## 2016-12-16 NOTE — Telephone Encounter (Signed)
New message    Pt was just seen today by Kerin Ransom. He was given new medication. When he picked it up the pharmacist stated there was an interaction between his medication.    Pt c/o medication issue:  1. Name of Medication: Diltiazam 120 mg, Simvastatin 10 mg  2. How are you currently taking this medication (dosage and times per day)? I tab daily  3. Are you having a reaction (difficulty breathing--STAT)?   4. What is your medication issue? Pt was told there was a drug interaction between his medications. He is calling to find out what to do.

## 2016-12-16 NOTE — Assessment & Plan Note (Signed)
Pt had AF after CABG/AVR in 2011 and was cardioverted- none since till Feb 2018

## 2016-12-16 NOTE — Assessment & Plan Note (Signed)
Last Plt count 212 at [facility] 12/02/16. Dr Lamonte Sakai follows

## 2016-12-16 NOTE — Progress Notes (Signed)
12/16/2016 [name] Brown   [birth year]  [id number]  Primary Physician Alysia Penna, MD Primary Cardiologist: Dr Percival Spanish  HPI:  81 y/o male followed by Dr Percival Spanish with a history of CAD and AS. He had a CABG x 3 and tissue AVR in June 2011. He had AF post on and was converted to NSR a few months later. He was on Coumadin for a time but this was later changed to an ASA secondary to a history of thrombocytopenia since his surgery, Dr Lamonte Sakai follows. Other problems include renal transplant, followed at [facility], skin cancer, IDDM, moderate carotid disease, and a history of prior CVA 2011. His CHADs VASc score is 6.  He has remained in NSR till recently.   The pt went to [place] Feb 9 th. Shortly after arriving in [place] he developed nausea and vomiting. He was treated for dehydration and followed up with his PCP 12/04/16. He had a cerumen impaction and Dr Sarajane Jews felt this was causing his nausea and vomiting. His ears were cleaned out and he received a steroid injection and a decongestant. His nausea resolved but when he saw his nephrologist 12/11/16 he mentioned he had fatigue. He was found to be in AF and was sent to the ED where he stayed overnight. His rate was controlled with extra beta blocker and he was placed on Eliquis. He is in the office today for follow up. He didn't really know he was in AF last week but says he can tell his HR is irregular when he lays flat. An echo was done at [facility] 12/12/16 and is included below.    Current Outpatient Prescriptions  Medication Sig Dispense Refill  . allopurinol (ZYLOPRIM) 100 MG tablet TAKE 2 TABLETS (200 MG TOTAL) BY MOUTH 2 (TWO) TIMES DAILY. 180 tablet 0  . ALPRAZolam (XANAX) 0.5 MG tablet Take 1 tablet (0.5 mg total) by mouth every 6 (six) hours as needed. 120 tablet 0  . apixaban (ELIQUIS) 5 MG TABS tablet Take 5 mg by mouth.    Marland Kitchen aspirin 81 MG tablet Take 81 mg by mouth daily.    . B-D ULTRAFINE III SHORT PEN 31G X 8 MM MISC USE AS DIRECTED TEST 3  TO 4 TIMES PER DAY 100 each 3  . calcium carbonate (TUMS - DOSED IN MG ELEMENTAL CALCIUM) 500 MG chewable tablet Chew 1 tablet by mouth daily.     . carvedilol (COREG) 12.5 MG tablet Take 1 tablet by mouth 2 (two) times daily.    . colchicine 0.6 MG tablet Take 0.6 mg by mouth daily as needed (gout).     . Cyanocobalamin (VITAMIN B 12 PO) Take 1 tablet by mouth daily.    Mariane Baumgarten Sodium (COLACE PO) Take 1 capsule by mouth 2 (two) times daily.     . furosemide (LASIX) 40 MG tablet Take 40 mg by mouth 2 (two) times daily.     Marland Kitchen glucose blood (ONE TOUCH ULTRA TEST) test strip Test 1-3 times per day and diagnosis code is E 11.9 100 each 0  . insulin aspart (NOVOLOG FLEXPEN) 100 UNIT/ML FlexPen Inject into the skin. Was not given any directions on how many units to take    . Insulin Glargine (LANTUS SOLOSTAR) 100 UNIT/ML Solostar Pen 30 units at bedtime 15 mL 5  . Krill Oil 1000 MG CAPS Take 1,000 mg by mouth daily.    Marland Kitchen lactulose (CHRONULAC) 10 GM/15ML solution Take 10 g by mouth as needed for mild constipation.     Marland Kitchen  levothyroxine (SYNTHROID, LEVOTHROID) 25 MCG tablet Take 25 mcg by mouth daily.     . metFORMIN (GLUCOPHAGE) 1000 MG tablet TAKE 1 TABLET BY MOUTH TWICE A DAY WITH A MEAL 180 tablet 3  . mycophenolate (MYFORTIC) 180 MG EC tablet Take 1 tablet (180 mg total) by mouth 2 (two) times daily. 2 tablet 0  . omeprazole (PRILOSEC) 20 MG capsule Take 20 mg by mouth daily.     Marland Kitchen oxyCODONE (OXY IR/ROXICODONE) 5 MG immediate release tablet Take 1 tablet (5 mg total) by mouth every 6 (six) hours as needed for moderate pain. 120 tablet 0  . simvastatin (ZOCOR) 10 MG tablet Take 1 tablet (10 mg total) by mouth at bedtime. 1 tablet 0  . tacrolimus (PROGRAF) 1 MG capsule Take 3 capsules (3 mg total) by mouth 2 (two) times daily. (Patient taking differently: Take 1 mg by mouth 2 (two) times daily. ) 6 capsule 0  . tacrolimus (PROGRAF) 1 MG capsule Take 1 mg by mouth as directed. 2 tabs in the AM, 1 tab  in the Pm    . temazepam (RESTORIL) 30 MG capsule TAKE ONE CAPSULE BY MOUTH AT BEDTIME AS NEEDED FILL 12-22-14 90 capsule 1  . traMADol (ULTRAM) 50 MG tablet TAKE 2 TABLETS BY MOUTH EVERY 6 HOURS AS NEEDED 240 tablet 1  . diltiazem (CARDIZEM CD) 120 MG 24 hr capsule Take 1 capsule (120 mg total) by mouth daily. 90 capsule 3   No current facility-administered medications for this visit.     Allergies  Allergen Reactions  . Penicillins     REACTION: hives   Past Medical History:  Diagnosis Date  . Anemia 01/02/2012  . Anemia associated with chronic renal failure   . Aortic stenosis   . Atrial fibrillation ([place])   . CAD (coronary artery disease)   . Chronic kidney disease    chronic renal failure  . CVA (cerebral infarction) 05/2010  . Diabetes mellitus   . Gout   . Hyperlipidemia   . Hypertension   . Myocardial infarction   . Obesity   . Shoulder pain, left    sees Dr. Nickola Major at [facility] in [place]   . Splenomegaly 2012  . Thrombocytopenia ([place]) 2011   sees Dr. Lamonte Sakai  . Thrombocytopenia [facility])    Social History   Social History  . Marital status: Widowed    Spouse name: N/A  . Number of children: N/A  . Years of education: N/A   Occupational History  . Not on file.   Social History Main Topics  . Smoking status: Never Smoker  . Smokeless tobacco: Never Used  . Alcohol use No  . Drug use: No  . Sexual activity: Not on file   Other Topics Concern  . Not on file   Social History Narrative  . No narrative on file    Family History  Problem Relation Age of Onset  . Liver disease Father   . Cancer      colon/fhx  . Hypertension      fhx  . Coronary artery disease      fhx   Review of Systems: General: negative for chills, fever, night sweats or weight changes.  Cardiovascular: negative for chest pain, dyspnea on exertion, edema, orthopnea, palpitations, paroxysmal nocturnal dyspnea or shortness of breath Dermatological: negative for  rash Respiratory: negative for cough or wheezing Urologic: negative for hematuria Abdominal: negative for nausea, vomiting, diarrhea, bright red blood per rectum, melena, or hematemesis Neurologic:  negative for visual changes, syncope, or dizziness All other systems reviewed and are otherwise negative except as noted above.    Blood pressure 122/72, pulse (!) 105, height 6' (1.829 m), weight 234 lb 6.4 oz (106.3 kg).  General appearance: alert, cooperative, no distress and moderately obese Neck: no JVD Lungs: clear to auscultation bilaterally and kyphosis Heart: irregularly irregular rhythm and short systolic murmur LSB Abdomen: obese, non tender Extremities: no edema Skin: pale, cool, dry Neurologic: Grossly normal  EKG AF with VR 105, Q V2  Echo 12/12/16  [facility] [place], [place]. [address] Transthoracic Echocardiogram Report  Name: ERLING, ARRAZOLA Study Date: 12/12/2016 Height: 70 in MRN: [id number] Patient Location: A573 Weight: 228 lb DOB: 12-18-1935 Gender: Male BSA: 2.2 m2 Age: 55 yrsEthnicity: Caucasian BP: 132/83 mmHg Reason For Study: Atrial fibrillation with RVR ([place]) HR: 94  Ordering Physician: [id number] Trudee Grip, KRISTIN Performed By: Karrie Doffing Referring Physician: REFERRAL, SELF - HISTORY Afib, CHF, CAD, DM, HTN, Stroke, s/p kidney transplant. -  PROCEDURE Study Quality: Technically difficult. A injection of Optison contrast agent  was performed to improve image quality. Contrast approved by: Dr. Kennith Center. O2  Saturation is 21. - SUMMARY The left ventricular size is normal. There is mild concentric left ventricular hypertrophy. LV ejection fraction = 55-60%. Overall left ventricular function appears to be normal . The left ventricular wall motion is normal. The right ventricle is moderately dilated.  Right ventricular hypokinesis There is a porcine aortic bioprosthesis There is trace  aortic regurgitation. There is mild mitral regurgitation. There is moderate to severe tricuspid regurgitation. Trace to mild pulmonic valvular regurgitation. The aortic root is normal size. There is no pericardial effusion. Moderate to severe pulmonary hypertension. Compared to prior study, there has been an interval increase in tricuspid  regurgitation and pulmonary artery systolic pressure. - FINDINGS:  LEFT VENTRICLE The left ventricular size is normal. There is mild concentric left  ventricular hypertrophy. LV ejection fraction = 55-60%. Overall left  ventricular function appears to be normal . Left ventricular filling pattern  is indeterminate. The left ventricular wall motion is normal. -  RIGHT VENTRICLE The right ventricle is moderately dilated. Right ventricular hypokinesis.  LEFT ATRIUM The left atrium is moderately dilated.  RIGHT ATRIUM The right atrium is mildly to moderately dilated. - AORTIC VALVE There is trace aortic regurgitation. Aortic valve peak pressure gradient is  14 mmHg. Aortic valve mean pressure gradient is 7 mmHg. There is a porcine  aortic bioprosthesis. - MITRAL VALVE There is moderate mitral annular calcification. There is mild mitral  regurgitation. - TRICUSPID VALVE Structurally normal tricuspid valve. There is moderate to severe tricuspid  regurgitation. Moderate to severe pulmonary hypertension. Estimated right  ventricular systolic pressure is 74 mmHg. Hepatic vein spectral Doppler  systolic flow is reversed. - PULMONIC VALVE The pulmonic valve is not well visualized. Trace to mild pulmonic valvular  regurgitation. - ARTERIES The aortic root is normal size. - VENOUS Pulmonary venous flow pattern not well visualized. IVC size was mildly  dilated. - EFFUSION There is no pericardial effusion. - - MMode/2D Measurements & Calculations IVSd: 1.2 cm LVIDd: 5.3 cm LVPWd: 1.4 cm LVIDs: 4.1 cm LA diam: 4.7 cm Ao root: 3.4  cm EDV(MOD-sp4): 122.1 ml ESV(MOD-sp4): 41.9 ml asc Aorta Diam: 3.5 cm LVOT diam: 1.9 cm LVOT area: 2.9 cm2 SV(MOD-sp4): 80.2 ml SI(MOD-sp4): 36.3 ml/m2 LA area A2: 32.1 cm2 LA area A4: 37.0 cm2 LA length (vol): 6.5 cm LA vol: 155.5  ml LA vol index: 70.5 ml/m2 RA area A4: 27.2 cm2 Doppler Measurements & Calculations MV E max vel: 154.0 cm/sec MV dec time: 0.27 sec SV(LVOT): 38.6 ml Ao V2 max: 188.0 cm/sec Ao max PG: 14.1 mmHg Ao V2 mean: 123.9 cm/sec Ao mean PG: 7.0 mmHg Ao V2 VTI: 34.0 cm AVA (VTI): 1.1 cm2 LV V1 VTI: 13.5 cm TR max vel: 398.4 cm/sec TR max PG: 63.5 mmHg RVSP(TR): 73.5 mmHg RAP systole: 10.0 mmHg AS Dimensionless Index (VTI): 0.40 AVAi(VTI) cm^2/m^2: 0.52 cm2 SV index(LVOT): 17.5 ml/m2   Reading Physician: [place], [id number] 12/12/2016 10:50 AM ASSESSMENT AND PLAN:   Atrial fibrillation, new onset ([place]) Pt had AF after CABG/AVR in 2011 and was cardioverted- none since till Feb 2018  Hx of CABG 2011 LIMA-LAD, SVG-OM, SVG-PDA  S/P aortic valve replacement Tissue AVR June 2011  Type 2 diabetes mellitus with renal complication (HCC) IDDM  Thrombocytopenia Last Plt count 212 at [facility] 12/02/16. Dr Lamonte Sakai follows  Long term current use of anticoagulant CHADs2VASc=6. Eliquis added Feb 2018  H/O kidney transplant Followed at [facility], last SCr 1.8 on 12/02/16   PLAN  Discussed with Dr Stanford Breed in the office- change Norvasc to Diltiazem 120 mg. F/U EKG next week. F/U OV in 3 weeks to set up DCCV.   Kerin Ransom PA-C 12/16/2016 1:34 PM

## 2016-12-16 NOTE — Assessment & Plan Note (Signed)
LIMA-LAD, SVG-OM, SVG-PDA

## 2016-12-16 NOTE — Assessment & Plan Note (Signed)
IDDM 

## 2016-12-16 NOTE — Telephone Encounter (Signed)
Spoke with pt states that pharmacist said that there was an interaction between diltiazem and simvastatin. Ok to take med even though there is an interaction? Please advise

## 2016-12-16 NOTE — Telephone Encounter (Signed)
Pt notified that (per Luke)this is a small dose and we are not concerned with an interaction.

## 2016-12-16 NOTE — Assessment & Plan Note (Signed)
Tissue AVR June 2011

## 2016-12-16 NOTE — Patient Instructions (Signed)
Your physician has recommended you make the following change in your medication:  -- STOP amlodipine -- START diltiazem 120mg  once daily  Your physician recommends that you schedule a follow-up appointment in [place] for nurse visit EKG check (when [place], [place] is in the office)  Your physician recommends that you schedule a follow-up appointment in: [place] with Dr. Percival Spanish or Lurena Joiner, [place] (day when Dr. Percival Spanish is in the office)

## 2016-12-16 NOTE — Telephone Encounter (Signed)
Ok per [employer]

## 2016-12-16 NOTE — Assessment & Plan Note (Signed)
CHADs2VASc=6. Eliquis added Feb 2018

## 2016-12-16 NOTE — Assessment & Plan Note (Signed)
Followed at [facility], last SCr 1.8 on 12/02/16

## 2016-12-23 ENCOUNTER — Ambulatory Visit (INDEPENDENT_AMBULATORY_CARE_PROVIDER_SITE_OTHER): Payer: Medicare Other

## 2016-12-23 VITALS — BP 132/74 | HR 54

## 2016-12-23 DIAGNOSIS — I4891 Unspecified atrial fibrillation: Secondary | ICD-10-CM | POA: Diagnosis not present

## 2016-12-23 NOTE — Patient Instructions (Signed)
1.) Reason for visit: ECG  2.) Name of MD requesting visit: Kerin Ransom, PA-C   3.) H&P: h/o afib  4.) ROS related to problem: Afib continues  5.) Assessment and plan per MD: per Kerin Ransom PA-C rate and rhythm ok. Follow up as schedule 01-09-17 with Dr Percival Spanish

## 2016-12-24 NOTE — Addendum Note (Signed)
Addended by: Waylan Rocher on: 12/24/2016 11:27 AM   Modules accepted: Orders

## 2017-01-01 ENCOUNTER — Encounter: Payer: Self-pay | Admitting: Cardiology

## 2017-01-05 ENCOUNTER — Telehealth: Payer: Self-pay | Admitting: Family Medicine

## 2017-01-06 ENCOUNTER — Telehealth: Payer: Self-pay | Admitting: Cardiology

## 2017-01-06 DIAGNOSIS — Z940 Kidney transplant status: Secondary | ICD-10-CM | POA: Diagnosis not present

## 2017-01-06 DIAGNOSIS — E039 Hypothyroidism, unspecified: Secondary | ICD-10-CM | POA: Diagnosis not present

## 2017-01-06 DIAGNOSIS — D631 Anemia in chronic kidney disease: Secondary | ICD-10-CM | POA: Diagnosis not present

## 2017-01-06 NOTE — Telephone Encounter (Signed)
New message   Pt states that this Rx was supposed to be filled before the weekend and it never was. Wants to know why it was not sent because he states his heart was racing.    *STAT* If patient is at the pharmacy, call can be transferred to refill team.   1. Which medications need to be refilled? (please list name of each medication and dose if known) diltiazem (CARDIZEM CD) 120 MG 24 hr capsule  2. Which pharmacy/location (including street and city if local pharmacy) is medication to be sent to? [facility] [address] in [place]  3. Do they need a 30 day or 90 day supply? 30 day supply

## 2017-01-06 NOTE — Telephone Encounter (Signed)
Spoke with pharmacy they state that pt has rx wilth refills and that pt picked up rx for 90 days on 12-16-16.  Spoke with pt he states that he has not had a dilt 120mg  rx since before his appt on 12-16-16 with luke. Informed pt that this is a pharmacy issue since he still has current rx with refills at the pharmacy. Pt states that he will call and discuss

## 2017-01-07 MED ORDER — GLUCOSE BLOOD VI STRP: ORAL_STRIP | 1 refills | Status: DC

## 2017-01-07 NOTE — Telephone Encounter (Signed)
I spoke with pt and A1c was done on 11/25/2016 and was 6.1 %, this was missing from a copy of scanned lab report.

## 2017-01-07 NOTE — Telephone Encounter (Signed)
[facility] 

## 2017-01-07 NOTE — Addendum Note (Signed)
Addended by: Aggie Hacker A on: 01/07/2017 11:11 AM   Modules accepted: Orders

## 2017-01-07 NOTE — Telephone Encounter (Signed)
Pt would like to know why his ONE TOUCH ULTRA TEST test strip [Pharmacy Med Name: ONE TOUCH ULTRA TEST STRIPS] [  Were denied.  Pt has A1c on 2/5.  [id number] [place], [place] - [address] AT [facility]

## 2017-01-08 NOTE — Progress Notes (Signed)
HPI The patient presents for follow up of his AVR and CAD.  Since I last saw him he has done well. He status post renal transplant a few years ago. He is followed closely at [facility].  Since I last saw him he was seen by Kerin Ransom. He was found to be atrial fib and sent to [facility] and kept overnight and started on anticoagulants as below.  He had an echocardiogram [facility] was noted to have increased pulmonary pressures with increasing tricuspid regurgitation.  His RV systolic pressure was estimated to be 73.  Since he's been in atrial fibrillation he feels very fatigued. He is having some shortness of breath with mild activity such as walking level ground across his house. He's much more tired than he was previously. He has had some increased leg swelling and it looks like his weights and gone up 10 pounds since he was last year. He's not describing chest discomfort, neck or arm discomfort. He does feel his palpitations but is not anything any presyncope or syncope. He seems to be tolerating anticoagulation.   Allergies  Allergen Reactions  . Penicillins     REACTION: hives    Current Outpatient Prescriptions  Medication Sig Dispense Refill  . allopurinol (ZYLOPRIM) 100 MG tablet TAKE 2 TABLETS (200 MG TOTAL) BY MOUTH 2 (TWO) TIMES DAILY. 180 tablet 0  . ALPRAZolam (XANAX) 0.5 MG tablet Take 1 tablet (0.5 mg total) by mouth every 6 (six) hours as needed. 120 tablet 0  . apixaban (ELIQUIS) 5 MG TABS tablet Take 5 mg by mouth.    Marland Kitchen aspirin 81 MG tablet Take 81 mg by mouth daily.    . B-D ULTRAFINE III SHORT PEN 31G X 8 MM MISC USE AS DIRECTED TEST 3 TO 4 TIMES PER DAY 100 each 3  . calcium carbonate (TUMS - DOSED IN MG ELEMENTAL CALCIUM) 500 MG chewable tablet Chew 1 tablet by mouth daily.     . carvedilol (COREG) 12.5 MG tablet Take 1 tablet by mouth 2 (two) times daily.    . colchicine 0.6 MG tablet Take 0.6 mg by mouth daily as needed (gout).     . Cyanocobalamin (VITAMIN B 12 PO) Take 1  tablet by mouth daily.    Marland Kitchen diltiazem (CARDIZEM CD) 120 MG 24 hr capsule Take 1 capsule (120 mg total) by mouth daily. 90 capsule 3  . Docusate Sodium (COLACE PO) Take 1 capsule by mouth 2 (two) times daily.     . furosemide (LASIX) 40 MG tablet Take 40 mg by mouth 2 (two) times daily.     Marland Kitchen glucose blood (ONE TOUCH ULTRA TEST) test strip Test 1-3 times per day and diagnosis code is E 11.9 100 each 1  . insulin aspart (NOVOLOG FLEXPEN) 100 UNIT/ML FlexPen Inject into the skin. Was not given any directions on how many units to take    . Insulin Glargine (LANTUS SOLOSTAR) 100 UNIT/ML Solostar Pen 30 units at bedtime 15 mL 5  . Krill Oil 1000 MG CAPS Take 1,000 mg by mouth daily.    Marland Kitchen lactulose (CHRONULAC) 10 GM/15ML solution Take 10 g by mouth as needed for mild constipation.     Marland Kitchen levothyroxine (SYNTHROID, LEVOTHROID) 25 MCG tablet Take 25 mcg by mouth daily.     . metFORMIN (GLUCOPHAGE) 1000 MG tablet TAKE 1 TABLET BY MOUTH TWICE A DAY WITH A MEAL 180 tablet 3  . mycophenolate (MYFORTIC) 180 MG EC tablet Take 1 tablet (180 mg total)  by mouth 2 (two) times daily. 2 tablet 0  . omeprazole (PRILOSEC) 20 MG capsule Take 20 mg by mouth daily.     Marland Kitchen oxyCODONE (OXY IR/ROXICODONE) 5 MG immediate release tablet Take 1 tablet (5 mg total) by mouth every 6 (six) hours as needed for moderate pain. 120 tablet 0  . simvastatin (ZOCOR) 10 MG tablet Take 1 tablet (10 mg total) by mouth at bedtime. 1 tablet 0  . tacrolimus (PROGRAF) 1 MG capsule Take 3 capsules (3 mg total) by mouth 2 (two) times daily. 6 capsule 0  . tacrolimus (PROGRAF) 1 MG capsule Take 1 mg by mouth as directed. 2 tabs in the AM, 1 tab in the Pm    . temazepam (RESTORIL) 30 MG capsule TAKE ONE CAPSULE BY MOUTH AT BEDTIME AS NEEDED FILL 12-22-14 90 capsule 1  . traMADol (ULTRAM) 50 MG tablet TAKE 2 TABLETS BY MOUTH EVERY 6 HOURS AS NEEDED 240 tablet 1   No current facility-administered medications for this visit.     Past Medical History:    Diagnosis Date  . Anemia 01/02/2012  . Anemia associated with chronic renal failure   . Aortic stenosis   . Atrial fibrillation ([place])   . CAD (coronary artery disease)   . Chronic kidney disease    chronic renal failure  . CVA (cerebral infarction) 05/2010  . Diabetes mellitus   . Gout   . Hyperlipidemia   . Hypertension   . Myocardial infarction   . Obesity   . Shoulder pain, left    sees Dr. Nickola Major at [facility] in [place]   . Splenomegaly 2012  . Thrombocytopenia ([place]) 2011   sees Dr. Lamonte Sakai  . Thrombocytopenia ([place])     Past Surgical History:  Procedure Laterality Date  . AORTIC VALVE REPLACEMENT     Pericardial tissue valve  . APPENDECTOMY    . bone spurs     from right heel  . CORONARY ARTERY BYPASS GRAFT     June 2 011 Limited the LAD, SVG to OM, SVG to PDA  . KIDNEY TRANSPLANT  01-20-12   at [place]:  As stated in the HPI and negative for all other systems.  PHYSICAL EXAM BP 116/74   Pulse 60   Ht 6' (1.829 m)   Wt 245 lb (111.1 kg)   SpO2 97%   BMI 33.23 kg/m  GENERAL:  Well appearing NECK:  Positve 10 cm jugular venous distention, waveform within normal limits, carotid upstroke brisk and symmetric, no bruits, no thyromegaly LYMPHATICS:  No cervical, inguinal adenopathy LUNGS:  Clear to auscultation bilaterally CHEST:  Well healed sternotomy scar. HEART:  PMI not displaced or sustained,S1 and S2 within normal limits, no S3, no S4, no clicks, no rubs, brief apical systolic murmur ABD:  Flat, positive bowel sounds normal in frequency in pitch, no bruits, no rebound, no guarding, no midline pulsatile mass, no hepatomegaly, no splenomegaly, well healed renal transplant scar, mildly obese EXT:  2 plus pulses throughout, mild leg edema, right upper dialysis fistula, no edema, no cyanosis no clubbing SKIN:  No rashes no nodules  EKG:  Sinus rhythm, right axis deviation, poor anterior R wave progression,  rate 75, QTC slightly  prolonged compared to EKG last year, no acute ST-T wave changes.   12/23/16  ECHO:  The left ventricular size is normal. There is mild concentric left ventricular hypertrophy. LV ejection fraction = 55-60%. Overall left ventricular function appears to be  normal . The left ventricular wall motion is normal. The right ventricle is moderately dilated. Right ventricular hypokinesis There is a porcine aortic bioprosthesis There is trace aortic regurgitation. There is mild mitral regurgitation. There is moderate to severe tricuspid regurgitation. Trace to mild pulmonic valvular regurgitation. The aortic root is normal size. There is no pericardial effusion. Moderate to severe pulmonary hypertension. Compared to prior study, there has been an interval increase in tricuspid  regurgitation and pulmonary artery systolic pressure.  LABS WBC 7.7, hemoglobin 11.2, platelets 104, TSH normal, BUN 23, creatinine 1.72, sodium 138, potassium 4.6, magnesium 1.7   01/01/17  ASSESSMENT AND PLAN  S/P aortic valve replacement -  He has stable aVR recently as above.  No change in therapy is planned.    PULMONARY HTN - I will repeat an echo after he has cardioversion.  He might need right heart cath if this persists.   ATRIAL FIB - He feels poorly and I think that this is related.  I will plan a cardioversion.    CORONARY ARTERY DISEASE -  The patient has no new sypmtoms. He has had no change since his CABG. He will continue with risk reduction.  HYPERTENSION - The blood pressure is at target. No change in medications is indicated. We will continue with therapeutic lifestyle changes (TLC).;  ACUTE ON CHRONIC DIASTOLIC HF:   The patient seems to have some excess volume. He'll take 2 days of increased Lasix. I'll check his basic metabolic profile as described.  Dyslipidemia - I will order a fasting lipid profile.    CAROTID STENOSIS - This was moderate and is due for follow up in July of this year.     QTC PROLONGED - We discussed the need to avoid QT prolonging drugs in the past. He has no symptoms related to this.  This seems to be unchanged in the EKG done on 3/5.   CKD - His labs are as above.  He will need a repeat BMET at the time of his lipid profile next week.     (Greater than 40 minutes reviewing all data with greater than 50% face to face with the patient).

## 2017-01-09 ENCOUNTER — Ambulatory Visit (INDEPENDENT_AMBULATORY_CARE_PROVIDER_SITE_OTHER): Payer: Medicare Other | Admitting: Cardiology

## 2017-01-09 ENCOUNTER — Encounter: Payer: Self-pay | Admitting: Cardiology

## 2017-01-09 VITALS — BP 116/74 | HR 60 | Ht 72.0 in | Wt 245.0 lb

## 2017-01-09 DIAGNOSIS — R9431 Abnormal electrocardiogram [ECG] [EKG]: Secondary | ICD-10-CM | POA: Diagnosis not present

## 2017-01-09 DIAGNOSIS — N183 Chronic kidney disease, stage 3 unspecified: Secondary | ICD-10-CM

## 2017-01-09 DIAGNOSIS — I272 Pulmonary hypertension, unspecified: Secondary | ICD-10-CM

## 2017-01-09 DIAGNOSIS — Z79899 Other long term (current) drug therapy: Secondary | ICD-10-CM

## 2017-01-09 DIAGNOSIS — E785 Hyperlipidemia, unspecified: Secondary | ICD-10-CM | POA: Diagnosis not present

## 2017-01-09 DIAGNOSIS — I4891 Unspecified atrial fibrillation: Secondary | ICD-10-CM

## 2017-01-09 DIAGNOSIS — I5033 Acute on chronic diastolic (congestive) heart failure: Secondary | ICD-10-CM

## 2017-01-09 MED ORDER — APIXABAN 5 MG PO TABS: 5.0000 mg | ORAL_TABLET | Freq: Two times a day (BID) | ORAL | 3 refills | Status: DC

## 2017-01-09 NOTE — Patient Instructions (Addendum)
Medication Instructions:  INCREASE- Lasix 40 mg twice a day for 2 days  Labwork: Fasting Lipids and BMP next week  Testing/Procedures: Your physician has recommended that you have a Cardioversion (DCCV). Electrical Cardioversion uses a jolt of electricity to your heart either through paddles or wired patches attached to your chest. This is a controlled, usually prescheduled, procedure. Defibrillation is done under light anesthesia in the hospital, and you usually go home the day of the procedure. This is done to get your heart back into a normal rhythm. You are not awake for the procedure. Please see the instruction sheet given to you today.   Follow-Up: Your physician recommends that you schedule a follow-up appointment in: After Cardioversion   Any Other Special Instructions Will Be Listed Below (If Applicable).   If you need a refill on your cardiac medications before your next appointment, please call your pharmacy.

## 2017-01-10 ENCOUNTER — Encounter: Payer: Self-pay | Admitting: Cardiology

## 2017-01-10 DIAGNOSIS — I272 Pulmonary hypertension, unspecified: Secondary | ICD-10-CM | POA: Insufficient documentation

## 2017-01-10 DIAGNOSIS — I5033 Acute on chronic diastolic (congestive) heart failure: Secondary | ICD-10-CM | POA: Insufficient documentation

## 2017-01-10 DIAGNOSIS — I4819 Other persistent atrial fibrillation: Secondary | ICD-10-CM

## 2017-01-13 ENCOUNTER — Ambulatory Visit (HOSPITAL_COMMUNITY): Payer: Medicare Other | Admitting: Anesthesiology

## 2017-01-13 ENCOUNTER — Other Ambulatory Visit: Payer: Self-pay | Admitting: Cardiology

## 2017-01-13 ENCOUNTER — Encounter (HOSPITAL_COMMUNITY): Payer: Self-pay | Admitting: *Deleted

## 2017-01-13 ENCOUNTER — Encounter (HOSPITAL_COMMUNITY)
Admission: RE | Disposition: A | Discharge status: Home / Self Care | Payer: Self-pay | Source: Ambulatory Visit | Attending: Cardiology

## 2017-01-13 ENCOUNTER — Ambulatory Visit (HOSPITAL_COMMUNITY)
Admission: RE | Admit: 2017-01-13 | Discharge: 2017-01-13 | Disposition: A | Discharge status: Home / Self Care | Payer: Medicare Other | Source: Ambulatory Visit | Attending: Cardiology | Admitting: Cardiology

## 2017-01-13 DIAGNOSIS — M109 Gout, unspecified: Secondary | ICD-10-CM | POA: Insufficient documentation

## 2017-01-13 DIAGNOSIS — Z940 Kidney transplant status: Secondary | ICD-10-CM | POA: Insufficient documentation

## 2017-01-13 DIAGNOSIS — I130 Hypertensive heart and chronic kidney disease with heart failure and stage 1 through stage 4 chronic kidney disease, or unspecified chronic kidney disease: Secondary | ICD-10-CM | POA: Diagnosis not present

## 2017-01-13 DIAGNOSIS — I5033 Acute on chronic diastolic (congestive) heart failure: Secondary | ICD-10-CM | POA: Insufficient documentation

## 2017-01-13 DIAGNOSIS — I272 Pulmonary hypertension, unspecified: Secondary | ICD-10-CM | POA: Diagnosis not present

## 2017-01-13 DIAGNOSIS — Z951 Presence of aortocoronary bypass graft: Secondary | ICD-10-CM | POA: Diagnosis not present

## 2017-01-13 DIAGNOSIS — I6529 Occlusion and stenosis of unspecified carotid artery: Secondary | ICD-10-CM | POA: Diagnosis not present

## 2017-01-13 DIAGNOSIS — I071 Rheumatic tricuspid insufficiency: Secondary | ICD-10-CM | POA: Insufficient documentation

## 2017-01-13 DIAGNOSIS — I2510 Atherosclerotic heart disease of native coronary artery without angina pectoris: Secondary | ICD-10-CM | POA: Diagnosis not present

## 2017-01-13 DIAGNOSIS — I4581 Long QT syndrome: Secondary | ICD-10-CM | POA: Insufficient documentation

## 2017-01-13 DIAGNOSIS — Z7901 Long term (current) use of anticoagulants: Secondary | ICD-10-CM | POA: Insufficient documentation

## 2017-01-13 DIAGNOSIS — N189 Chronic kidney disease, unspecified: Secondary | ICD-10-CM | POA: Insufficient documentation

## 2017-01-13 DIAGNOSIS — I4891 Unspecified atrial fibrillation: Secondary | ICD-10-CM | POA: Diagnosis not present

## 2017-01-13 DIAGNOSIS — Z6833 Body mass index (BMI) 33.0-33.9, adult: Secondary | ICD-10-CM | POA: Insufficient documentation

## 2017-01-13 DIAGNOSIS — I252 Old myocardial infarction: Secondary | ICD-10-CM | POA: Diagnosis not present

## 2017-01-13 DIAGNOSIS — I4819 Other persistent atrial fibrillation: Secondary | ICD-10-CM

## 2017-01-13 DIAGNOSIS — I10 Essential (primary) hypertension: Secondary | ICD-10-CM | POA: Diagnosis not present

## 2017-01-13 DIAGNOSIS — E785 Hyperlipidemia, unspecified: Secondary | ICD-10-CM | POA: Diagnosis not present

## 2017-01-13 DIAGNOSIS — Z79899 Other long term (current) drug therapy: Secondary | ICD-10-CM | POA: Diagnosis not present

## 2017-01-13 DIAGNOSIS — Z7982 Long term (current) use of aspirin: Secondary | ICD-10-CM | POA: Insufficient documentation

## 2017-01-13 DIAGNOSIS — E039 Hypothyroidism, unspecified: Secondary | ICD-10-CM | POA: Insufficient documentation

## 2017-01-13 DIAGNOSIS — K219 Gastro-esophageal reflux disease without esophagitis: Secondary | ICD-10-CM | POA: Diagnosis not present

## 2017-01-13 DIAGNOSIS — E669 Obesity, unspecified: Secondary | ICD-10-CM | POA: Diagnosis not present

## 2017-01-13 DIAGNOSIS — Z794 Long term (current) use of insulin: Secondary | ICD-10-CM | POA: Diagnosis not present

## 2017-01-13 DIAGNOSIS — Z952 Presence of prosthetic heart valve: Secondary | ICD-10-CM | POA: Insufficient documentation

## 2017-01-13 DIAGNOSIS — N289 Disorder of kidney and ureter, unspecified: Secondary | ICD-10-CM | POA: Diagnosis not present

## 2017-01-13 DIAGNOSIS — E1122 Type 2 diabetes mellitus with diabetic chronic kidney disease: Secondary | ICD-10-CM | POA: Insufficient documentation

## 2017-01-13 DIAGNOSIS — Z8673 Personal history of transient ischemic attack (TIA), and cerebral infarction without residual deficits: Secondary | ICD-10-CM | POA: Insufficient documentation

## 2017-01-13 HISTORY — PX: CARDIOVERSION: SHX1299

## 2017-01-13 LAB — GLUCOSE, CAPILLARY: GLUCOSE-CAPILLARY: 104 mg/dL — AB (ref 65–99)

## 2017-01-13 SURGERY — CARDIOVERSION
Anesthesia: General

## 2017-01-13 MED ORDER — SODIUM CHLORIDE 0.9% FLUSH: 3.0000 mL | INTRAVENOUS | Status: DC | PRN

## 2017-01-13 MED ORDER — SODIUM CHLORIDE 0.9% FLUSH: 3.0000 mL | Freq: Two times a day (BID) | INTRAVENOUS | Status: DC

## 2017-01-13 MED ORDER — LIDOCAINE HCL (CARDIAC) 20 MG/ML IV SOLN
INTRAVENOUS | Status: DC | PRN
  Administered 2017-01-13: 60 mg via INTRAVENOUS

## 2017-01-13 MED ORDER — SODIUM CHLORIDE 0.9 % IV SOLN
250.0000 mL | INTRAVENOUS | Status: DC
  Administered 2017-01-13: 13:00:00 via INTRAVENOUS

## 2017-01-13 MED ORDER — PROPOFOL 10 MG/ML IV BOLUS
INTRAVENOUS | Status: DC | PRN
  Administered 2017-01-13: 50 mg via INTRAVENOUS

## 2017-01-13 NOTE — Interval H&P Note (Signed)
History and Physical Interval Note:  01/13/2017 1:53 PM  [name] Brown  has presented today for surgery, with the diagnosis of afib  The various methods of treatment have been discussed with the patient and family. After consideration of risks, benefits and other options for treatment, the patient has consented to  Procedure(s): CARDIOVERSION (N/A) as a surgical intervention .  The patient's history has been reviewed, patient examined, no change in status, stable for surgery.  I have reviewed the patient's chart and labs.  Questions were answered to the patient's satisfaction.     Jodel Mayhall [employer]

## 2017-01-13 NOTE — Transfer of Care (Signed)
Immediate Anesthesia Transfer of Care Note  Patient: [name] Brown  Procedure(s) Performed: Procedure(s): CARDIOVERSION (N/A)  Patient Location: PACU and Endoscopy Unit  Anesthesia Type:General  Level of Consciousness: awake, alert , oriented and patient cooperative  Airway & Oxygen Therapy: Patient Spontanous Breathing and Patient connected to nasal cannula oxygen  Post-op Assessment: Report given to RN and Post -op Vital signs reviewed and stable  Post vital signs: Reviewed and stable  Last Vitals:  Vitals:   01/13/17 1301  BP: (!) 152/90  Resp: (!) 24  Temp: 36.9 C    Last Pain:  Vitals:   01/13/17 1301  TempSrc: Oral         Complications: No apparent anesthesia complications

## 2017-01-13 NOTE — Procedures (Signed)
Electrical Cardioversion Procedure Note TIP ATIENZA [id number] 08-14-1936  Procedure: Electrical Cardioversion Indications:  Atrial Fibrillation.  The patient has not missed any Eliquis doses.   Procedure Details Consent: Risks of procedure as well as the alternatives and risks of each were explained to the (patient/caregiver).  Consent for procedure obtained. Time Out: Verified patient identification, verified procedure, site/side was marked, verified correct patient position, special equipment/implants available, medications/allergies/relevent history reviewed, required imaging and test results available.  Performed  Patient placed on cardiac monitor, pulse oximetry, supplemental oxygen as necessary.  Sedation given: Propofol per anesthesiology Pacer pads placed anterior and posterior chest.  Cardioverted 1 time(s).  Cardioverted at [place].  Evaluation Findings: Post procedure EKG shows: NSR Complications: None Patient did tolerate procedure well.   Loralie Champagne 01/13/2017, 1:59 PM

## 2017-01-13 NOTE — Discharge Instructions (Signed)
Electrical Cardioversion, Care After °This sheet gives you information about how to care for yourself after your procedure. Your health care provider may also give you more specific instructions. If you have problems or questions, contact your health care provider. °What can I expect after the procedure? °After the procedure, it is common to have: °· Some redness on the skin where the shocks were given. °Follow these instructions at home: °· Do not drive for 24 hours if you were given a medicine to help you relax (sedative). °· Take over-the-counter and prescription medicines only as told by your health care provider. °· Ask your health care provider how to check your pulse. Check it often. °· Rest for 48 hours after the procedure or as told by your health care provider. °· Avoid or limit your caffeine use as told by your health care provider. °Contact a health care provider if: °· You feel like your heart is beating too quickly or your pulse is not regular. °· You have a serious muscle cramp that does not go away. °Get help right away if: °· You have discomfort in your chest. °· You are dizzy or you feel faint. °· You have trouble breathing or you are short of breath. °· Your speech is slurred. °· You have trouble moving an arm or leg on one side of your body. °· Your fingers or toes turn cold or blue. °This information is not intended to replace advice given to you by your health care provider. Make sure you discuss any questions you have with your health care provider. °Document Released: 07/28/2013 Document Revised: 05/10/2016 Document Reviewed: 04/12/2016 °Elsevier Interactive Patient Education © 2017 Else[employer] Inc[employer] ° °

## 2017-01-13 NOTE — H&P (View-Only) (Signed)
HPI The patient presents for follow up of his AVR and CAD.  Since I last saw him he has done well. He status post renal transplant a few years ago. He is followed closely at [facility].  Since I last saw him he was seen by Kerin Ransom. He was found to be atrial fib and sent to [facility] and kept overnight and started on anticoagulants as below.  He had an echocardiogram [facility] was noted to have increased pulmonary pressures with increasing tricuspid regurgitation.  His RV systolic pressure was estimated to be 73.  Since he's been in atrial fibrillation he feels very fatigued. He is having some shortness of breath with mild activity such as walking level ground across his house. He's much more tired than he was previously. He has had some increased leg swelling and it looks like his weights and gone up 10 pounds since he was last year. He's not describing chest discomfort, neck or arm discomfort. He does feel his palpitations but is not anything any presyncope or syncope. He seems to be tolerating anticoagulation.   Allergies  Allergen Reactions  . Penicillins     REACTION: hives    Current Outpatient Prescriptions  Medication Sig Dispense Refill  . allopurinol (ZYLOPRIM) 100 MG tablet TAKE 2 TABLETS (200 MG TOTAL) BY MOUTH 2 (TWO) TIMES DAILY. 180 tablet 0  . ALPRAZolam (XANAX) 0.5 MG tablet Take 1 tablet (0.5 mg total) by mouth every 6 (six) hours as needed. 120 tablet 0  . apixaban (ELIQUIS) 5 MG TABS tablet Take 5 mg by mouth.    Marland Kitchen aspirin 81 MG tablet Take 81 mg by mouth daily.    . B-D ULTRAFINE III SHORT PEN 31G X 8 MM MISC USE AS DIRECTED TEST 3 TO 4 TIMES PER DAY 100 each 3  . calcium carbonate (TUMS - DOSED IN MG ELEMENTAL CALCIUM) 500 MG chewable tablet Chew 1 tablet by mouth daily.     . carvedilol (COREG) 12.5 MG tablet Take 1 tablet by mouth 2 (two) times daily.    . colchicine 0.6 MG tablet Take 0.6 mg by mouth daily as needed (gout).     . Cyanocobalamin (VITAMIN B 12 PO) Take 1  tablet by mouth daily.    Marland Kitchen diltiazem (CARDIZEM CD) 120 MG 24 hr capsule Take 1 capsule (120 mg total) by mouth daily. 90 capsule 3  . Docusate Sodium (COLACE PO) Take 1 capsule by mouth 2 (two) times daily.     . furosemide (LASIX) 40 MG tablet Take 40 mg by mouth 2 (two) times daily.     Marland Kitchen glucose blood (ONE TOUCH ULTRA TEST) test strip Test 1-3 times per day and diagnosis code is E 11.9 100 each 1  . insulin aspart (NOVOLOG FLEXPEN) 100 UNIT/ML FlexPen Inject into the skin. Was not given any directions on how many units to take    . Insulin Glargine (LANTUS SOLOSTAR) 100 UNIT/ML Solostar Pen 30 units at bedtime 15 mL 5  . Krill Oil 1000 MG CAPS Take 1,000 mg by mouth daily.    Marland Kitchen lactulose (CHRONULAC) 10 GM/15ML solution Take 10 g by mouth as needed for mild constipation.     Marland Kitchen levothyroxine (SYNTHROID, LEVOTHROID) 25 MCG tablet Take 25 mcg by mouth daily.     . metFORMIN (GLUCOPHAGE) 1000 MG tablet TAKE 1 TABLET BY MOUTH TWICE A DAY WITH A MEAL 180 tablet 3  . mycophenolate (MYFORTIC) 180 MG EC tablet Take 1 tablet (180 mg total)  by mouth 2 (two) times daily. 2 tablet 0  . omeprazole (PRILOSEC) 20 MG capsule Take 20 mg by mouth daily.     Marland Kitchen oxyCODONE (OXY IR/ROXICODONE) 5 MG immediate release tablet Take 1 tablet (5 mg total) by mouth every 6 (six) hours as needed for moderate pain. 120 tablet 0  . simvastatin (ZOCOR) 10 MG tablet Take 1 tablet (10 mg total) by mouth at bedtime. 1 tablet 0  . tacrolimus (PROGRAF) 1 MG capsule Take 3 capsules (3 mg total) by mouth 2 (two) times daily. 6 capsule 0  . tacrolimus (PROGRAF) 1 MG capsule Take 1 mg by mouth as directed. 2 tabs in the AM, 1 tab in the Pm    . temazepam (RESTORIL) 30 MG capsule TAKE ONE CAPSULE BY MOUTH AT BEDTIME AS NEEDED FILL 12-22-14 90 capsule 1  . traMADol (ULTRAM) 50 MG tablet TAKE 2 TABLETS BY MOUTH EVERY 6 HOURS AS NEEDED 240 tablet 1   No current facility-administered medications for this visit.     Past Medical History:    Diagnosis Date  . Anemia 01/02/2012  . Anemia associated with chronic renal failure   . Aortic stenosis   . Atrial fibrillation ([place])   . CAD (coronary artery disease)   . Chronic kidney disease    chronic renal failure  . CVA (cerebral infarction) 05/2010  . Diabetes mellitus   . Gout   . Hyperlipidemia   . Hypertension   . Myocardial infarction   . Obesity   . Shoulder pain, left    sees Dr. Nickola Major at [facility] in [place]   . Splenomegaly 2012  . Thrombocytopenia ([place]) 2011   sees Dr. Lamonte Sakai  . Thrombocytopenia ([place])     Past Surgical History:  Procedure Laterality Date  . AORTIC VALVE REPLACEMENT     Pericardial tissue valve  . APPENDECTOMY    . bone spurs     from right heel  . CORONARY ARTERY BYPASS GRAFT     June 2 011 Limited the LAD, SVG to OM, SVG to PDA  . KIDNEY TRANSPLANT  01-20-12   at [place]:  As stated in the HPI and negative for all other systems.  PHYSICAL EXAM BP 116/74   Pulse 60   Ht 6' (1.829 m)   Wt 245 lb (111.1 kg)   SpO2 97%   BMI 33.23 kg/m  GENERAL:  Well appearing NECK:  Positve 10 cm jugular venous distention, waveform within normal limits, carotid upstroke brisk and symmetric, no bruits, no thyromegaly LYMPHATICS:  No cervical, inguinal adenopathy LUNGS:  Clear to auscultation bilaterally CHEST:  Well healed sternotomy scar. HEART:  PMI not displaced or sustained,S1 and S2 within normal limits, no S3, no S4, no clicks, no rubs, brief apical systolic murmur ABD:  Flat, positive bowel sounds normal in frequency in pitch, no bruits, no rebound, no guarding, no midline pulsatile mass, no hepatomegaly, no splenomegaly, well healed renal transplant scar, mildly obese EXT:  2 plus pulses throughout, mild leg edema, right upper dialysis fistula, no edema, no cyanosis no clubbing SKIN:  No rashes no nodules  EKG:  Sinus rhythm, right axis deviation, poor anterior R wave progression,  rate 75, QTC slightly  prolonged compared to EKG last year, no acute ST-T wave changes.   12/23/16  ECHO:  The left ventricular size is normal. There is mild concentric left ventricular hypertrophy. LV ejection fraction = 55-60%. Overall left ventricular function appears to be  normal . The left ventricular wall motion is normal. The right ventricle is moderately dilated. Right ventricular hypokinesis There is a porcine aortic bioprosthesis There is trace aortic regurgitation. There is mild mitral regurgitation. There is moderate to severe tricuspid regurgitation. Trace to mild pulmonic valvular regurgitation. The aortic root is normal size. There is no pericardial effusion. Moderate to severe pulmonary hypertension. Compared to prior study, there has been an interval increase in tricuspid  regurgitation and pulmonary artery systolic pressure.  LABS WBC 7.7, hemoglobin 11.2, platelets 104, TSH normal, BUN 23, creatinine 1.72, sodium 138, potassium 4.6, magnesium 1.7   01/01/17  ASSESSMENT AND PLAN  S/P aortic valve replacement -  He has stable aVR recently as above.  No change in therapy is planned.    PULMONARY HTN - I will repeat an echo after he has cardioversion.  He might need right heart cath if this persists.   ATRIAL FIB - He feels poorly and I think that this is related.  I will plan a cardioversion.    CORONARY ARTERY DISEASE -  The patient has no new sypmtoms. He has had no change since his CABG. He will continue with risk reduction.  HYPERTENSION - The blood pressure is at target. No change in medications is indicated. We will continue with therapeutic lifestyle changes (TLC).;  ACUTE ON CHRONIC DIASTOLIC HF:   The patient seems to have some excess volume. He'll take 2 days of increased Lasix. I'll check his basic metabolic profile as described.  Dyslipidemia - I will order a fasting lipid profile.    CAROTID STENOSIS - This was moderate and is due for follow up in July of this year.     QTC PROLONGED - We discussed the need to avoid QT prolonging drugs in the past. He has no symptoms related to this.  This seems to be unchanged in the EKG done on 3/5.   CKD - His labs are as above.  He will need a repeat BMET at the time of his lipid profile next week.     (Greater than 40 minutes reviewing all data with greater than 50% face to face with the patient).

## 2017-01-13 NOTE — Anesthesia Postprocedure Evaluation (Signed)
Anesthesia Post Note  Patient: Makarios Madlock Brandner  Procedure(s) Performed: Procedure(s) (LRB): CARDIOVERSION (N/A)  Patient location during evaluation: PACU Anesthesia Type: General Level of consciousness: awake and alert Pain management: pain level controlled Vital Signs Assessment: post-procedure vital signs reviewed and stable Respiratory status: spontaneous breathing, nonlabored ventilation and respiratory function stable Cardiovascular status: blood pressure returned to baseline and stable Postop Assessment: no signs of nausea or vomiting Anesthetic complications: no       Last Vitals:  Vitals:   01/13/17 1430 01/13/17 1437  BP: (!) 158/75   Pulse: 77 78  Resp: (!) 21 (!) 22  Temp:      Last Pain:  Vitals:   01/13/17 1404  TempSrc: Oral                 Linnae Rasool,W. EDMOND

## 2017-01-13 NOTE — Anesthesia Preprocedure Evaluation (Addendum)
Anesthesia Evaluation  Patient identified by MRN, date of birth, ID band Patient awake    Reviewed: Allergy & Precautions, NPO status , Patient's Chart, lab work & pertinent test results, reviewed documented beta blocker date and time   Airway Mallampati: II  TM Distance: >3 FB Neck ROM: Full    Dental  (+) Teeth Intact, Dental Advisory Given   Pulmonary neg pulmonary ROS,    Pulmonary exam normal breath sounds clear to auscultation       Cardiovascular hypertension, Pt. on home beta blockers + CAD, + Past MI, + CABG and +CHF  + dysrhythmias Atrial Fibrillation + Valvular Problems/Murmurs AS  Rhythm:Irregular Rate:Tachycardia  s/p CABG, AVR  Echo 11/15/15: Study Conclusions  - Left ventricle: The cavity size was normal. Wall thickness was increased in a pattern of moderate LVH. Systolic function was normal. The estimated ejection fraction was in the range of 60% to 65%. Wall motion was normal; there were no regional wall motion abnormalities. - Aortic valve: A bioprosthesis was present. - Mitral valve: Moderately calcified annulus. Moderately thickened, moderately calcified leaflets . The findings are consistent with moderate stenosis. - Left atrium: The atrium was moderately dilated. - Right atrium: The atrium was mildly dilated. - Pulmonary arteries: PA peak pressure: 31 mm Hg (S).   Neuro/Psych PSYCHIATRIC DISORDERS Depression CVA    GI/Hepatic Neg liver ROS, GERD  Medicated,  Endo/Other  diabetes, Type 2, Insulin Dependent, Oral Hypoglycemic AgentsHypothyroidism Obesity   Renal/GU Renal InsufficiencyRenal diseases/p Kidney transplant     Musculoskeletal  (+) Arthritis , Osteoarthritis,    Abdominal   Peds  Hematology  (+) Blood dyscrasia (thrombocytopenia; Eliquis therapy), anemia ,   Anesthesia Other Findings Day of surgery medications reviewed with the patient.  Reproductive/Obstetrics                             Anesthesia Physical Anesthesia Plan  ASA: III  Anesthesia Plan: General   Post-op Pain Management:    Induction: Intravenous  Airway Management Planned: Mask  Additional Equipment:   Intra-op Plan:   Post-operative Plan:   Informed Consent: I have reviewed the patients History and Physical, chart, labs and discussed the procedure including the risks, benefits and alternatives for the proposed anesthesia with the patient or authorized representative who has indicated his/her understanding and acceptance.   Dental advisory given  Plan Discussed with: CRNA  Anesthesia Plan Comments:         Anesthesia Quick Evaluation

## 2017-01-13 NOTE — Addendum Note (Signed)
Addended by: Minus Breeding on: 01/13/2017 06:28 AM   Modules accepted: Orders, SmartSet

## 2017-01-14 ENCOUNTER — Encounter (HOSPITAL_COMMUNITY): Payer: Self-pay | Admitting: Cardiology

## 2017-01-20 DIAGNOSIS — Z79899 Other long term (current) drug therapy: Secondary | ICD-10-CM | POA: Diagnosis not present

## 2017-01-20 DIAGNOSIS — E785 Hyperlipidemia, unspecified: Secondary | ICD-10-CM | POA: Diagnosis not present

## 2017-01-20 LAB — BASIC METABOLIC PANEL
BUN: 34 mg/dL — AB (ref 7–25)
CO2: 28 mmol/L (ref 20–31)
Calcium: 9 mg/dL (ref 8.6–10.3)
Chloride: 102 mmol/L (ref 98–110)
Creat: 2.06 mg/dL — ABNORMAL HIGH (ref 0.70–1.18)
GLUCOSE: 129 mg/dL — AB (ref 65–99)
POTASSIUM: 4.5 mmol/L (ref 3.5–5.3)
SODIUM: 140 mmol/L (ref 135–146)

## 2017-01-20 LAB — LIPID PANEL
CHOL/HDL RATIO: 2.2 ratio (ref <5.0)
CHOLESTEROL: 119 mg/dL (ref <200)
HDL: 54 mg/dL (ref >40)
LDL Cholesterol: 51 mg/dL (ref <100)
Triglycerides: 69 mg/dL (ref <150)
VLDL: 14 mg/dL (ref <30)

## 2017-02-03 ENCOUNTER — Telehealth: Payer: Self-pay | Admitting: Cardiology

## 2017-02-03 NOTE — Progress Notes (Signed)
HPI The patient presents for follow up of his AVR and CAD.  Since I last saw him he had DCCV for atrial fib.   He said he felt well after cardioversion and he was in sinus rhythm. However, he had a vomiting episode about a week ago when he thinks he went back in atrial fibrillation. This is similar to what happened to him earlier in the year when he was in [place] and vomited and went back into fibrillation. He does note that his weight is down and remains down despite probably being in fibrillation for the last week. It goes up and down but it has trended down and he doesn't have as much edema or dyspnea as he had. He doesn't think he has as much exercise tolerance as he did when he was converted him back in sinus rhythm. He has a consistent fatigue. He associates tiredness with fibrillation. He does not have any new acute shortness of breath, PND or orthopnea. He really doesn't notice palpitations as much as he did previously and would not been able to state that he was definitely in fibrillation but he thought something changed last week. He denies any chest pressure, neck or arm discomfort. He is not clear why he's had a couple of nausea episodes.  Allergies  Allergen Reactions  . Penicillins     REACTION: hives    Current Outpatient Prescriptions  Medication Sig Dispense Refill  . allopurinol (ZYLOPRIM) 100 MG tablet TAKE 2 TABLETS (200 MG TOTAL) BY MOUTH 2 (TWO) TIMES DAILY. 180 tablet 0  . ALPRAZolam (XANAX) 0.5 MG tablet Take 1 tablet (0.5 mg total) by mouth every 6 (six) hours as needed. 120 tablet 0  . apixaban (ELIQUIS) 5 MG TABS tablet Take 1 tablet (5 mg total) by mouth 2 (two) times daily. 180 tablet 3  . aspirin 81 MG tablet Take 81 mg by mouth daily.    . B-D ULTRAFINE III SHORT PEN 31G X 8 MM MISC USE AS DIRECTED TEST 3 TO 4 TIMES PER DAY 100 each 3  . calcium carbonate (TUMS - DOSED IN MG ELEMENTAL CALCIUM) 500 MG chewable tablet Chew 1 tablet by mouth daily.     . carvedilol  (COREG) 12.5 MG tablet Take 1 tablet by mouth 2 (two) times daily.    . colchicine 0.6 MG tablet Take 0.6 mg by mouth daily as needed (gout).     . Cyanocobalamin (VITAMIN B 12 PO) Take 1 tablet by mouth daily.    Marland Kitchen diltiazem (CARDIZEM CD) 120 MG 24 hr capsule Take 1 capsule (120 mg total) by mouth daily. 90 capsule 3  . Docusate Sodium (COLACE PO) Take 1 capsule by mouth 2 (two) times daily.     . furosemide (LASIX) 40 MG tablet Take 40 mg by mouth daily.     Marland Kitchen glucose blood (ONE TOUCH ULTRA TEST) test strip Test 1-3 times per day and diagnosis code is E 11.9 100 each 1  . insulin aspart (NOVOLOG FLEXPEN) 100 UNIT/ML FlexPen Inject into the skin. Was not given any directions on how many units to take    . Insulin Glargine (LANTUS SOLOSTAR) 100 UNIT/ML Solostar Pen 30 units at bedtime 15 mL 5  . Krill Oil 1000 MG CAPS Take 1,000 mg by mouth daily.    Marland Kitchen lactulose (CHRONULAC) 10 GM/15ML solution Take 10 g by mouth as needed for mild constipation.     Marland Kitchen levothyroxine (SYNTHROID, LEVOTHROID) 25 MCG tablet Take 25 mcg by  mouth daily.     . metFORMIN (GLUCOPHAGE) 1000 MG tablet TAKE 1 TABLET BY MOUTH TWICE A DAY WITH A MEAL 180 tablet 3  . mycophenolate (MYFORTIC) 180 MG EC tablet Take 1 tablet (180 mg total) by mouth 2 (two) times daily. 2 tablet 0  . omeprazole (PRILOSEC) 20 MG capsule Take 20 mg by mouth daily.     . ondansetron (ZOFRAN) 4 MG tablet Take 1 tablet (4 mg total) by mouth every 8 (eight) hours as needed for nausea or vomiting. 20 tablet 2  . oxyCODONE (OXY IR/ROXICODONE) 5 MG immediate release tablet Take 1 tablet (5 mg total) by mouth every 6 (six) hours as needed for moderate pain. 120 tablet 0  . simvastatin (ZOCOR) 10 MG tablet Take 1 tablet (10 mg total) by mouth at bedtime. 1 tablet 0  . tacrolimus (PROGRAF) 1 MG capsule Take 1 mg by mouth as directed. 2 tabs in the AM, 1 tab in the Pm    . temazepam (RESTORIL) 30 MG capsule TAKE ONE CAPSULE BY MOUTH AT BEDTIME AS NEEDED FILL  12-22-14 90 capsule 1  . traMADol (ULTRAM) 50 MG tablet TAKE 2 TABLETS BY MOUTH EVERY 6 HOURS AS NEEDED 240 tablet 1   No current facility-administered medications for this visit.     Past Medical History:  Diagnosis Date  . Anemia 01/02/2012  . Anemia associated with chronic renal failure   . Aortic stenosis   . Atrial fibrillation ([place])   . CAD (coronary artery disease)   . Chronic kidney disease    chronic renal failure  . CVA (cerebral infarction) 05/2010  . Diabetes mellitus   . Gout   . Hyperlipidemia   . Hypertension   . Myocardial infarction ([place])   . Obesity   . Shoulder pain, left    sees Dr. Nickola [facility] at [facility] in [place]   . Splenomegaly 2012  . Thrombocytopenia ([place]) 2011   sees Dr. Lamonte Sakai  . Thrombocytopenia ([place])     Past Surgical History:  Procedure Laterality Date  . AORTIC VALVE REPLACEMENT     Pericardial tissue valve  . APPENDECTOMY    . bone spurs     from right heel  . CARDIOVERSION N/A 01/13/2017   Procedure: CARDIOVERSION;  Surgeon: Larey Dresser, MD;  Location: [facility] ENDOSCOPY;  Service: Cardiovascular;  Laterality: N/A;  . CORONARY ARTERY BYPASS GRAFT     June 2 011 Limited the LAD, SVG to OM, SVG to PDA  . KIDNEY TRANSPLANT  01-20-12   at [facility]:   As stated in the HPI and negative for all other systems.  PHYSICAL EXAM BP 108/78   Pulse 78   Ht 5\' 11"  (1.803 m)   Wt 232 lb 6.4 oz (105.4 kg)   BMI 32.41 kg/m  GENERAL:  Mildly chronically ill appearing NECK:  Positve 6 cm jugular venous distention, waveform within normal limits, carotid upstroke brisk and symmetric, no bruits, no thyromegaly LYMPHATICS:  No cervical, inguinal adenopathy LUNGS:  Clear to auscultation bilaterally CHEST:  Well healed sternotomy scar. HEART:  PMI not displaced or sustained,S1 and S2 within normal limits, no S3, no clicks, no rubs, brief apical systolic murmur, irregular  ABD:  Flat, positive bowel sounds normal in frequency  in pitch, no bruits, no rebound, no guarding, no midline pulsatile mass, no hepatomegaly, no splenomegaly, well healed renal transplant scar, mildly obese EXT:  2 plus pulses throughout, trace leg edema, right upper dialysis  fistula, no cyanosis no clubbing SKIN:  No rashes no nodules.  Bruising.    EKG:  Atrial fibrillation, rate 65, left axis deviation, left anterior fascicular block, poor anterior R wave progression, prolonged QT, no change from previous.  Lab Results  Component Value Date   CHOL 119 01/20/2017   TRIG 69 01/20/2017   HDL 54 01/20/2017   LDLCALC 51 01/20/2017   LDLDIRECT 110.1 01/02/2009   Lab Results  Component Value Date   CREATININE 2.06 (H) 01/20/2017    ASSESSMENT AND PLAN  S/P aortic valve replacement -  He has stable AVR with recent echo at [facility].  No change in therapy is planned.   PULMONARY HTN - I will follow up an echo in about six months.    ATRIAL FIB - I'm not sure that this is contributing to any symptoms. However, he thinks it is. He would not be a candidate for  IIb meds. He would not be a good candidate for Tikosyn with his QT and renal insufficiency.   I'm not sure sotalol is a reasonable option for him and he has had some lung problems so amiodarone would be slightly difficult but possible. Alternatively we could consider ablation although again I told him I am no guarantee of much different he will feel if we can achieve sinus rhythm and maintain it. I will refer him to pulmonary for further evaluation.     CORONARY ARTERY DISEASE -  The patient has no new sypmtoms. He has had no change since his CABG. He will continue with risk reduction. No change in meds is planned  HYPERTENSION - The blood pressure is at target. He will remain on meds as listed.   CHRONIC DIASTOLIC HF:   The patient seems to be much better as far as his volume goes.  He will continue on current therapy.   Dyslipidemia - He had an excellent lipid profile as above.   We reviewed this today.   CAROTID STENOSIS - This was moderate and is due for follow up in July of this year.   I will arrange this.   QTC PROLONGED - We discussed the need to avoid QT prolonging drugs in the past.  Today the QT is as above.  He has no symptoms related to this.   CKD - His creat was 2.06 as above.  We will follow this along with renal.

## 2017-02-03 NOTE — Telephone Encounter (Signed)
Pt says he have been having irregular heart beats,this started several days ago.He had a vomiting episode,after that the irregular heart beats started. I gave pt an appointment for tomorrow. Please call to advise.

## 2017-02-03 NOTE — Telephone Encounter (Signed)
Spoke with pt, he had an episode while in [place] of vomiting, since then he has noticed his pulse is irregular. It feels different than the fluttering he felt with the atrial fib. He can not tell me his heart rate. He denies chest pain and reports the SOB he has is his usual. He continues to have nausea but no more vomiting. He was given an appointment to see dr hochrein tomorrow and is fine with that appointment. Patient voiced understanding to call if symptoms change or worsen prior to appointment.

## 2017-02-04 ENCOUNTER — Ambulatory Visit (INDEPENDENT_AMBULATORY_CARE_PROVIDER_SITE_OTHER): Payer: Medicare Other | Admitting: Cardiology

## 2017-02-04 ENCOUNTER — Encounter: Payer: Self-pay | Admitting: Cardiology

## 2017-02-04 ENCOUNTER — Ambulatory Visit: Payer: Medicare Other | Admitting: Cardiology

## 2017-02-04 ENCOUNTER — Other Ambulatory Visit: Payer: Self-pay | Admitting: Family Medicine

## 2017-02-04 VITALS — BP 108/78 | HR 78 | Ht 71.0 in | Wt 232.4 lb

## 2017-02-04 DIAGNOSIS — I481 Persistent atrial fibrillation: Secondary | ICD-10-CM | POA: Diagnosis not present

## 2017-02-04 DIAGNOSIS — N183 Chronic kidney disease, stage 3 unspecified: Secondary | ICD-10-CM

## 2017-02-04 DIAGNOSIS — R9431 Abnormal electrocardiogram [ECG] [EKG]: Secondary | ICD-10-CM | POA: Diagnosis not present

## 2017-02-04 DIAGNOSIS — I4819 Other persistent atrial fibrillation: Secondary | ICD-10-CM

## 2017-02-04 MED ORDER — ONDANSETRON HCL 4 MG PO TABS: 4.0000 mg | ORAL_TABLET | Freq: Three times a day (TID) | ORAL | 2 refills | Status: DC | PRN

## 2017-02-04 NOTE — Patient Instructions (Signed)
Medication Instructions:  Continue current medications  Labwork: None Ordered  Testing/Procedures: None Ordered  Follow-Up: You have been referred to Dr Curt Bears or Dr [name]   Any Other Special Instructions Will Be Listed Below (If Applicable).   If you need a refill on your cardiac medications before your next appointment, please call your pharmacy.

## 2017-02-05 DIAGNOSIS — D631 Anemia in chronic kidney disease: Secondary | ICD-10-CM | POA: Diagnosis not present

## 2017-02-05 DIAGNOSIS — Z940 Kidney transplant status: Secondary | ICD-10-CM | POA: Diagnosis not present

## 2017-02-05 DIAGNOSIS — E039 Hypothyroidism, unspecified: Secondary | ICD-10-CM | POA: Diagnosis not present

## 2017-02-05 DIAGNOSIS — M109 Gout, unspecified: Secondary | ICD-10-CM | POA: Diagnosis not present

## 2017-02-06 ENCOUNTER — Other Ambulatory Visit: Payer: Self-pay | Admitting: Family Medicine

## 2017-02-06 NOTE — Addendum Note (Signed)
Addended by: Vennie Homans on: 02/06/2017 08:52 AM   Modules accepted: Orders

## 2017-02-07 NOTE — Telephone Encounter (Signed)
Rx was filled yesterday.

## 2017-02-12 ENCOUNTER — Ambulatory Visit (INDEPENDENT_AMBULATORY_CARE_PROVIDER_SITE_OTHER): Payer: Medicare Other | Admitting: Cardiology

## 2017-02-12 ENCOUNTER — Encounter: Payer: Self-pay | Admitting: Cardiology

## 2017-02-12 ENCOUNTER — Telehealth: Payer: Self-pay | Admitting: Cardiology

## 2017-02-12 VITALS — BP 130/82 | HR 70 | Ht 71.0 in | Wt 235.6 lb

## 2017-02-12 DIAGNOSIS — I481 Persistent atrial fibrillation: Secondary | ICD-10-CM | POA: Diagnosis not present

## 2017-02-12 DIAGNOSIS — I4819 Other persistent atrial fibrillation: Secondary | ICD-10-CM

## 2017-02-12 NOTE — Patient Instructions (Signed)
Medication Instructions:   Your physician has recommended you make the following change in your medication:  1) START Amiodarone -- Armel Rabbani, RN will call you to instruct when to begin this medication  Labwork:  None ordered  Testing/Procedures: Your physician has recommended that you have a Cardioversion (DCCV) - after starting Amiodarone. Electrical Cardioversion uses a jolt of electricity to your heart either through paddles or wired patches attached to your chest. This is a controlled, usually prescheduled, procedure. Defibrillation is done under light anesthesia in the hospital, and you usually go home the day of the procedure. This is done to get your heart back into a normal rhythm. You are not awake for the procedure.   Brentley Horrell, RN will call you to schedule this procedure.  Follow-Up:  Your physician recommends that you schedule a follow-up appointment in: 3 months with Dr. Curt Bears.  - If you need a refill on your cardiac medications before your next appointment, please call your pharmacy.     Thank you for choosing [facility]!!   Trinidad Curet, RN [phone]  Any Other Special Instructions Will Be Listed Below (If Applicable).  Amiodarone tablets What is this medicine? AMIODARONE (a MEE oh da rone) is an antiarrhythmic drug. It helps make your heart beat regularly. Because of the side effects caused by this medicine, it is only used when other medicines have not worked. It is usually used for heartbeat problems that may be life threatening. This medicine may be used for other purposes; ask your health care provider or pharmacist if you have questions. COMMON BRAND NAME(S): Cordarone, Pacerone What should I tell my health care provider before I take this medicine? They need to know if you have any of these conditions: -liver disease -lung disease -other heart problems -thyroid disease -an unusual or allergic reaction to amiodarone, iodine, other medicines, foods,  dyes, or preservatives -pregnant or trying to get pregnant -breast-feeding How should I use this medicine? Take this medicine by mouth with a glass of water. Follow the directions on the prescription label. You can take this medicine with or without food. However, you should always take it the same way each time. Take your doses at regular intervals. Do not take your medicine more often than directed. Do not stop taking except on the advice of your doctor or health care professional. A special MedGuide will be given to you by the pharmacist with each prescription and refill. Be sure to read this information carefully each time. Talk to your pediatrician regarding the use of this medicine in children. Special care may be needed. Overdosage: If you think you have taken too much of this medicine contact a poison control center or emergency room at once. NOTE: This medicine is only for you. Do not share this medicine with others. What if I miss a dose? If you miss a dose, take it as soon as you can. If it is almost time for your next dose, take only that dose. Do not take double or extra doses. What may interact with this medicine? Do not take this medicine with any of the following medications: -abarelix -apomorphine -arsenic trioxide -certain antibiotics like erythromycin, gemifloxacin, levofloxacin, pentamidine -certain medicines for depression like amoxapine, tricyclic antidepressants -certain medicines for fungal infections like fluconazole, itraconazole, ketoconazole, posaconazole, voriconazole -certain medicines for irregular heart beat like disopyramide, dofetilide, dronedarone, ibutilide, propafenone, sotalol -certain medicines for malaria like chloroquine, halofantrine -cisapride -droperidol -haloperidol -hawthorn -maprotiline -methadone -phenothiazines like chlorpromazine, mesoridazine, thioridazine -pimozide -ranolazine -red yeast  rice -vardenafil -ziprasidone This medicine  may also interact with the following medications: -antiviral medicines for HIV or AIDS -certain medicines for blood pressure, heart disease, irregular heart beat -certain medicines for cholesterol like atorvastatin, cerivastatin, lovastatin, simvastatin -certain medicines for hepatitis C like sofosbuvir and ledipasvir; sofosbuvir -certain medicines for seizures like phenytoin -certain medicines for thyroid problems -certain medicines that treat or prevent blood clots like warfarin -cholestyramine -cimetidine -clopidogrel -cyclosporine -dextromethorphan -diuretics -fentanyl -general anesthetics -grapefruit juice -lidocaine -loratadine -methotrexate -other medicines that prolong the QT interval (cause an abnormal heart rhythm) -procainamide -quinidine -rifabutin, rifampin, or rifapentine -St. John's Wort -trazodone This list may not describe all possible interactions. Give your health care provider a list of all the medicines, herbs, non-prescription drugs, or dietary supplements you use. Also tell them if you smoke, drink alcohol, or use illegal drugs. Some items may interact with your medicine. What should I watch for while using this medicine? Your condition will be monitored closely when you first begin therapy. Often, this drug is first started in a hospital or other monitored health care setting. Once you are on maintenance therapy, visit your doctor or health care professional for regular checks on your progress. Because your condition and use of this medicine carry some risk, it is a good idea to carry an identification card, necklace or bracelet with details of your condition, medications, and doctor or health care professional. Dennis Bast may get drowsy or dizzy. Do not drive, use machinery, or do anything that needs mental alertness until you know how this medicine affects you. Do not stand or sit up quickly, especially if you are an older patient. This reduces the risk of dizzy or  fainting spells. This medicine can make you more sensitive to the sun. Keep out of the sun. If you cannot avoid being in the sun, wear protective clothing and use sunscreen. Do not use sun lamps or tanning beds/booths. You should have regular eye exams before and during treatment. Call your doctor if you have blurred vision, see halos, or your eyes become sensitive to light. Your eyes may get dry. It may be helpful to use a lubricating eye solution or artificial tears solution. If you are going to have surgery or a procedure that requires contrast dyes, tell your doctor or health care professional that you are taking this medicine. What side effects may I notice from receiving this medicine? Side effects that you should report to your doctor or health care professional as soon as possible: -allergic reactions like skin rash, itching or hives, swelling of the face, lips, or tongue -blue-gray coloring of the skin -blurred vision, seeing blue green halos, increased sensitivity of the eyes to light -breathing problems -chest pain -dark urine -fast, irregular heartbeat -feeling faint or light-headed -intolerance to heat or cold -nausea or vomiting -pain and swelling of the scrotum -pain, tingling, numbness in feet, hands -redness, blistering, peeling or loosening of the skin, including inside the mouth -spitting up blood -stomach pain -sweating -unusual or uncontrolled movements of body -unusually weak or tired -weight gain or loss -yellowing of the eyes or skin Side effects that usually do not require medical attention (report to your doctor or health care professional if they continue or are bothersome): -change in sex drive or performance -constipation -dizziness -headache -loss of appetite -trouble sleeping This list may not describe all possible side effects. Call your doctor for medical advice about side effects. You may report side effects to FDA at 1-800-FDA-1088. Where should I  keep my medicine? Keep out of the reach of children. Store at room temperature between 20 and 25 degrees C (68 and 77 degrees F). Protect from light. Keep container tightly closed. Throw away any unused medicine after the expiration date. NOTE: This sheet is a summary. It may not cover all possible information. If you have questions about this medicine, talk to your doctor, pharmacist, or health care provider.  2018 Elsevier/Gold Standard (2014-01-10 19:48:11)

## 2017-02-12 NOTE — Progress Notes (Signed)
Electrophysiology Office Note   Date:  02/12/2017   ID:  [name] Brown, DOB 15-Sep-1936, MRN [id number]  PCP:  Alysia Penna, MD  Cardiologist:  [place] Primary Electrophysiologist:  Salle Brandle Meredith Leeds, MD    Chief Complaint  Patient presents with  . Advice Only    PAF/discuss ablation     History of Present Illness: [name] Brown is a 81 y.o. male who is being seen today for the evaluation of atrial fibrillation at the request of Laurey Morale, MD. Presenting today for electrophysiology evaluation. Has a history of coronary artery disease status post CABG and aVR. He also has a history of atrial fibrillation status post cardioversion. One week after his cardioversion, he had an episode of nausea and vomiting and one back in atrial fibrillation. Cardioversion was on 01/13/17. His main complaint with her atrial fibrillation is due to fatigue and decreased exercise tolerance. He denies chest pain, neck or arm pain, PND, or orthopnea.    Past Medical History:  Diagnosis Date  . Anemia 01/02/2012  . Anemia associated with chronic renal failure   . Aortic stenosis   . Atrial fibrillation ([place])   . CAD (coronary artery disease)   . Chronic kidney disease    chronic renal failure  . CVA (cerebral infarction) 05/2010  . Diabetes mellitus   . Gout   . Hyperlipidemia   . Hypertension   . Myocardial infarction ([place])   . Obesity   . Shoulder pain, left    sees Dr. Nickola Major at [facility] in [place]   . Splenomegaly 2012  . Thrombocytopenia ([place]) 2011   sees Dr. Lamonte Sakai  . Thrombocytopenia ([place])    Past Surgical History:  Procedure Laterality Date  . AORTIC VALVE REPLACEMENT     Pericardial tissue valve  . APPENDECTOMY    . bone spurs     from right heel  . CARDIOVERSION N/A 01/13/2017   Procedure: CARDIOVERSION;  Surgeon: Larey Dresser, MD;  Location: [facility] ENDOSCOPY;  Service: Cardiovascular;  Laterality: N/A;  . CORONARY ARTERY BYPASS GRAFT     June 2  011 Limited the LAD, SVG to OM, SVG to PDA  . KIDNEY TRANSPLANT  01-20-12   at [facility]      Current Outpatient Prescriptions  Medication Sig Dispense Refill  . allopurinol (ZYLOPRIM) 100 MG tablet TAKE 2 TABLETS (200 MG TOTAL) BY MOUTH 2 (TWO) TIMES DAILY. 180 tablet 0  . ALPRAZolam (XANAX) 0.5 MG tablet Take 1 tablet (0.5 mg total) by mouth every 6 (six) hours as needed. 120 tablet 0  . apixaban (ELIQUIS) 5 MG TABS tablet Take 1 tablet (5 mg total) by mouth 2 (two) times daily. 180 tablet 3  . aspirin 81 MG tablet Take 81 mg by mouth daily.    . B-D ULTRAFINE III SHORT PEN 31G X 8 MM MISC USE AS DIRECTED TEST 3 TO 4 TIMES PER DAY 100 each 0  . calcium carbonate (TUMS - DOSED IN MG ELEMENTAL CALCIUM) 500 MG chewable tablet Chew 1 tablet by mouth daily.     . carvedilol (COREG) 12.5 MG tablet Take 1 tablet by mouth 2 (two) times daily.    . colchicine 0.6 MG tablet Take 0.6 mg by mouth daily as needed (gout).     . Cyanocobalamin (VITAMIN B 12 PO) Take 1 tablet by mouth daily.    Marland Kitchen diltiazem (CARDIZEM CD) 120 MG 24 hr capsule Take 1 capsule (120 mg total) by mouth daily. 90 capsule  3  . Docusate Sodium (COLACE PO) Take 1 capsule by mouth 2 (two) times daily.     . furosemide (LASIX) 40 MG tablet Take 40 mg by mouth daily.     Marland Kitchen glucose blood (ONE TOUCH ULTRA TEST) test strip Test 1-3 times per day and diagnosis code is E 11.9 100 each 1  . insulin aspart (NOVOLOG FLEXPEN) 100 UNIT/ML FlexPen Inject into the skin. Was not given any directions on how many units to take    . Insulin Glargine (LANTUS SOLOSTAR) 100 UNIT/ML Solostar Pen 30 units at bedtime 15 mL 5  . Krill Oil 1000 MG CAPS Take 1,000 mg by mouth daily.    Marland Kitchen lactulose (CHRONULAC) 10 GM/15ML solution Take 10 g by mouth as needed for mild constipation.     Marland Kitchen levothyroxine (SYNTHROID, LEVOTHROID) 25 MCG tablet Take 25 mcg by mouth daily.     . metFORMIN (GLUCOPHAGE) 1000 MG tablet TAKE 1 TABLET BY MOUTH TWICE A DAY WITH A MEAL  180 tablet 3  . mycophenolate (MYFORTIC) 180 MG EC tablet Take 1 tablet (180 mg total) by mouth 2 (two) times daily. 2 tablet 0  . omeprazole (PRILOSEC) 20 MG capsule Take 20 mg by mouth daily.     . ondansetron (ZOFRAN) 4 MG tablet Take 1 tablet (4 mg total) by mouth every 8 (eight) hours as needed for nausea or vomiting. 20 tablet 2  . oxyCODONE (OXY IR/ROXICODONE) 5 MG immediate release tablet Take 1 tablet (5 mg total) by mouth every 6 (six) hours as needed for moderate pain. 120 tablet 0  . simvastatin (ZOCOR) 10 MG tablet Take 1 tablet (10 mg total) by mouth at bedtime. 1 tablet 0  . tacrolimus (PROGRAF) 1 MG capsule Take 1 mg by mouth as directed. 2 tabs in the AM, 1 tab in the Pm    . temazepam (RESTORIL) 30 MG capsule TAKE ONE CAPSULE BY MOUTH AT BEDTIME AS NEEDED FILL 12-22-14 90 capsule 1  . traMADol (ULTRAM) 50 MG tablet TAKE 2 TABLETS BY MOUTH EVERY 6 HOURS AS NEEDED 240 tablet 1   No current facility-administered medications for this visit.     Allergies:   Penicillins   Social History:  The patient  reports that he has never smoked. He has never used smokeless tobacco. He reports that he does not drink alcohol or use drugs.   Family History:  The patient's family history includes Liver disease in his father.    ROS:  Please see the history of present illness.   Otherwise, review of systems is positive for Fatigue, palpitations, shortness of breath with exertion, easy bruising.   All other systems are reviewed and negative.    PHYSICAL EXAM: VS:  BP 130/82   Pulse 70   Ht 5\' 11"  (1.803 m)   Wt 235 lb 9.6 oz (106.9 kg)   BMI 32.86 kg/m  , BMI Body mass index is 32.86 kg/m. GEN: Well nourished, well developed, in no acute distress  HEENT: normal  Neck: no JVD, carotid bruits, or masses Cardiac: iRRR; no murmurs, rubs, or gallops,no edema  Respiratory:  clear to auscultation bilaterally, normal work of breathing GI: soft, nontender, nondistended, + BS MS: no deformity or  atrophy  Skin: warm and dry Neuro:  Strength and sensation are intact Psych: euthymic mood, full affect  EKG:  EKG is not ordered today. Personal review of the ekg ordered shows Atrial fibrillation, rate 65, left bundle branch block, QTC 482 ms  Recent Labs: 01/20/2017: BUN 34; Creat 2.06; Potassium 4.5; Sodium 140    Lipid Panel     Component Value Date/Time   CHOL 119 01/20/2017 0849   TRIG 69 01/20/2017 0849   TRIG 221 (HH) 10/28/2006 1046   HDL 54 01/20/2017 0849   CHOLHDL 2.2 01/20/2017 0849   VLDL 14 01/20/2017 0849   LDLCALC 51 01/20/2017 0849   LDLDIRECT 110.1 01/02/2009 1117     Wt Readings from Last 3 Encounters:  02/12/17 235 lb 9.6 oz (106.9 kg)  02/04/17 232 lb 6.4 oz (105.4 kg)  01/13/17 245 lb (111.1 kg)      Other studies Reviewed: Additional studies/ records that were reviewed today include: TTE 11/15/15  Review of the above records today demonstrates:  - Left ventricle: The cavity size was normal. Wall thickness was   increased in a pattern of moderate LVH. Systolic function was   normal. The estimated ejection fraction was in the range of 60%   to 65%. Wall motion was normal; there were no regional wall   motion abnormalities. - Aortic valve: A bioprosthesis was present. - Mitral valve: Moderately calcified annulus. Moderately thickened,   moderately calcified leaflets . The findings are consistent with   moderate stenosis. - Left atrium: The atrium was moderately dilated. - Right atrium: The atrium was mildly dilated. - Pulmonary arteries: PA peak pressure: 31 mm Hg (S).   ASSESSMENT AND PLAN:  1.  Persistent atrial fibrillation: Currently on Eliquis for anticoagulation. Due to his persistent atrial fibrillation, we'll plan to start amiodarone. We did discuss the other option of ablation, but he would like to try to avoid procedures at this time. After the amiodarone has been started, Caylei Sperry plan for cardioversion. We Mckensi Redinger discuss with pharmacy any  erections remain amiodarone and his transplant medications.  This patients CHA2DS2-VASc Score and unadjusted Ischemic Stroke Rate (% per year) is equal to 11.2 % stroke rate/year from a score of 7  Above score calculated as 1 point each if present [CHF, HTN, DM, Vascular=MI/PAD/Aortic Plaque, Age if 65-74, or Male] Above score calculated as 2 points each if present [Age > 75, or Stroke/TIA/TE]  2. Coronary artery disease: Status post CABG. No current chest pain.  3. Hypertension: Blood pressure ago. No medication changes.  4. Diastolic heart failure: Volume status is stable.     Current medicines are reviewed at length with the patient today.   The patient does not have concerns regarding his medicines.  The following changes were made today:  Start amiodarone  Labs/ tests ordered today include:  No orders of the defined types were placed in this encounter.    Disposition:   FU with Jennae Hakeem 3 months  Signed, Jupiter Kabir Meredith Leeds, MD  02/12/2017 11:51 AM     [facility] [address] [place] [address] [phone] (office) [phone] (fax)

## 2017-02-12 NOTE — Telephone Encounter (Signed)
Follow Up:   Pt said he was on the medicine you were thinking about putting him on.He was put on that medicine in 2013 and his kidney doctor  took him off of it in the last year.She thought it was keeping his blood pressure too low. He is not not sure it might have been keeping him out of Atrial Fib at the same time.[name] Brown

## 2017-02-13 NOTE — Telephone Encounter (Signed)
Pt understands I will review w/ pharmacy and call him next week.

## 2017-02-20 ENCOUNTER — Encounter: Payer: Self-pay | Admitting: Cardiology

## 2017-02-20 ENCOUNTER — Other Ambulatory Visit: Payer: Self-pay | Admitting: Family Medicine

## 2017-02-20 NOTE — Telephone Encounter (Signed)
Pt aware I will call him next week with instructions after discussing with Dr. Curt Bears.

## 2017-02-20 NOTE — Telephone Encounter (Signed)
This encounter was created in error - please disregard.

## 2017-02-20 NOTE — Telephone Encounter (Signed)
[name] Brown at 02/20/2017 8:48 AM   Status: Signed    Patient calling states that Dr. Elisabeth Most (kidney doctor) gave her permission for him to start the Amiodarone medication. Please call to discuss, thanks

## 2017-02-20 NOTE — Telephone Encounter (Signed)
Patient calling states that Dr. Elisabeth Most (kidney doctor) gave her permission for him to start the Amiodarone medication. Please call to discuss, thanks.

## 2017-02-27 NOTE — Telephone Encounter (Signed)
Apologized that Dr. Curt Bears and I did not speak about this week.  Informed that he had a limited time in the office this week and then flew out for an EP conference. He understands I will call him next week once reviewed.  Pt voice understanding and stated all was ok and that next week would be fine.

## 2017-03-04 ENCOUNTER — Telehealth: Payer: Self-pay | Admitting: Cardiology

## 2017-03-04 MED ORDER — AMIODARONE HCL 200 MG PO TABS: 200.0000 mg | ORAL_TABLET | Freq: Every day | ORAL | 3 refills | Status: DC

## 2017-03-04 MED ORDER — AMIODARONE HCL 200 MG PO TABS: ORAL_TABLET | ORAL | 0 refills | Status: DC

## 2017-03-04 NOTE — Telephone Encounter (Signed)
Instructed to begin Amiodarone.  Take 400 mg BID x 2 weeks, then reduce and take 400 mg once daily x 2 weeks, then reduce and take 200 mg once daily. Patient verbalized understanding and agreeable to plan.  [facility].  Pt understands I will call him this week or next to arrange DCCV.

## 2017-03-04 NOTE — Telephone Encounter (Signed)
New Message  Pt voiced following up on MD's decision.

## 2017-03-05 DIAGNOSIS — I4891 Unspecified atrial fibrillation: Secondary | ICD-10-CM | POA: Diagnosis not present

## 2017-03-05 DIAGNOSIS — Z7901 Long term (current) use of anticoagulants: Secondary | ICD-10-CM | POA: Diagnosis not present

## 2017-03-05 DIAGNOSIS — N186 End stage renal disease: Secondary | ICD-10-CM | POA: Diagnosis not present

## 2017-03-05 DIAGNOSIS — D8989 Other specified disorders involving the immune mechanism, not elsewhere classified: Secondary | ICD-10-CM | POA: Diagnosis not present

## 2017-03-05 DIAGNOSIS — I2510 Atherosclerotic heart disease of native coronary artery without angina pectoris: Secondary | ICD-10-CM | POA: Diagnosis not present

## 2017-03-05 DIAGNOSIS — E119 Type 2 diabetes mellitus without complications: Secondary | ICD-10-CM | POA: Diagnosis not present

## 2017-03-05 DIAGNOSIS — Z952 Presence of prosthetic heart valve: Secondary | ICD-10-CM | POA: Diagnosis not present

## 2017-03-05 DIAGNOSIS — R609 Edema, unspecified: Secondary | ICD-10-CM | POA: Diagnosis not present

## 2017-03-05 DIAGNOSIS — Z794 Long term (current) use of insulin: Secondary | ICD-10-CM | POA: Diagnosis not present

## 2017-03-05 DIAGNOSIS — Z4822 Encounter for aftercare following kidney transplant: Secondary | ICD-10-CM | POA: Diagnosis not present

## 2017-03-05 DIAGNOSIS — I132 Hypertensive heart and chronic kidney disease with heart failure and with stage 5 chronic kidney disease, or end stage renal disease: Secondary | ICD-10-CM | POA: Diagnosis not present

## 2017-03-05 DIAGNOSIS — Z792 Long term (current) use of antibiotics: Secondary | ICD-10-CM | POA: Diagnosis not present

## 2017-03-05 DIAGNOSIS — E785 Hyperlipidemia, unspecified: Secondary | ICD-10-CM | POA: Diagnosis not present

## 2017-03-05 DIAGNOSIS — Z951 Presence of aortocoronary bypass graft: Secondary | ICD-10-CM | POA: Diagnosis not present

## 2017-03-05 DIAGNOSIS — Z8673 Personal history of transient ischemic attack (TIA), and cerebral infarction without residual deficits: Secondary | ICD-10-CM | POA: Diagnosis not present

## 2017-03-05 DIAGNOSIS — Z953 Presence of xenogenic heart valve: Secondary | ICD-10-CM | POA: Diagnosis not present

## 2017-03-05 DIAGNOSIS — Z940 Kidney transplant status: Secondary | ICD-10-CM | POA: Diagnosis not present

## 2017-03-05 DIAGNOSIS — Z9889 Other specified postprocedural states: Secondary | ICD-10-CM | POA: Diagnosis not present

## 2017-03-05 DIAGNOSIS — Z85828 Personal history of other malignant neoplasm of skin: Secondary | ICD-10-CM | POA: Diagnosis not present

## 2017-03-05 DIAGNOSIS — E039 Hypothyroidism, unspecified: Secondary | ICD-10-CM | POA: Diagnosis not present

## 2017-03-05 DIAGNOSIS — Z7982 Long term (current) use of aspirin: Secondary | ICD-10-CM | POA: Diagnosis not present

## 2017-03-05 DIAGNOSIS — I509 Heart failure, unspecified: Secondary | ICD-10-CM | POA: Diagnosis not present

## 2017-03-05 DIAGNOSIS — Z79899 Other long term (current) drug therapy: Secondary | ICD-10-CM | POA: Diagnosis not present

## 2017-03-05 DIAGNOSIS — E1122 Type 2 diabetes mellitus with diabetic chronic kidney disease: Secondary | ICD-10-CM | POA: Diagnosis not present

## 2017-03-05 DIAGNOSIS — I10 Essential (primary) hypertension: Secondary | ICD-10-CM | POA: Diagnosis not present

## 2017-03-05 DIAGNOSIS — E1121 Type 2 diabetes mellitus with diabetic nephropathy: Secondary | ICD-10-CM | POA: Diagnosis not present

## 2017-03-05 DIAGNOSIS — D899 Disorder involving the immune mechanism, unspecified: Secondary | ICD-10-CM | POA: Diagnosis not present

## 2017-03-05 LAB — HEMOGLOBIN A1C: HEMOGLOBIN A1C: 6

## 2017-03-06 ENCOUNTER — Other Ambulatory Visit: Payer: Self-pay | Admitting: Family Medicine

## 2017-03-10 ENCOUNTER — Other Ambulatory Visit: Payer: Self-pay | Admitting: Family Medicine

## 2017-03-10 ENCOUNTER — Other Ambulatory Visit: Payer: Self-pay | Admitting: Cardiology

## 2017-03-10 NOTE — Telephone Encounter (Signed)
Advised to decrease Amiodarone to 200 mg TID thru next Thursday.  Pt is agreeable to try. Informed DCCV scheduled for 5/31 and we will discuss instructions by the end of the week. Patient verbalized understanding and agreeable to plan.

## 2017-03-10 NOTE — Telephone Encounter (Signed)
Pt would like to schedule DCCV for 5/31. He also is having severe stomach upset/nausea w/ loading dosing of Amiodarone.  Dr. Curt Bears advised to try to take 200 mg TID and if that doesn't work decrease to 200 mg BID -- I will inform pt when I call him back

## 2017-03-12 ENCOUNTER — Encounter: Payer: Self-pay | Admitting: *Deleted

## 2017-03-12 ENCOUNTER — Other Ambulatory Visit: Payer: Self-pay | Admitting: Family Medicine

## 2017-03-12 ENCOUNTER — Other Ambulatory Visit: Payer: Self-pay | Admitting: Cardiology

## 2017-03-12 ENCOUNTER — Telehealth: Payer: Self-pay | Admitting: Cardiology

## 2017-03-12 NOTE — Telephone Encounter (Signed)
DCCV instructions reviewed with patient. Instructions:    1/2 Lantus dose at bedtime the night before procedure  May have clear liquid breakfast prior to 6am morning of   NPO after 6am morning of  May take morning  medications except Lasix, Insulin, Metformin  Must have a responsible driver to drive him home after procedure  Pre procedure labs will be done in the hospital prior to procedure  Patient verbalized understanding and agreeable to plan.

## 2017-03-18 DIAGNOSIS — D631 Anemia in chronic kidney disease: Secondary | ICD-10-CM | POA: Diagnosis not present

## 2017-03-18 DIAGNOSIS — Z940 Kidney transplant status: Secondary | ICD-10-CM | POA: Diagnosis not present

## 2017-03-18 DIAGNOSIS — E039 Hypothyroidism, unspecified: Secondary | ICD-10-CM | POA: Diagnosis not present

## 2017-03-19 ENCOUNTER — Telehealth: Payer: Self-pay | Admitting: Cardiology

## 2017-03-19 DIAGNOSIS — E119 Type 2 diabetes mellitus without complications: Secondary | ICD-10-CM | POA: Diagnosis present

## 2017-03-19 DIAGNOSIS — R1013 Epigastric pain: Secondary | ICD-10-CM | POA: Diagnosis not present

## 2017-03-19 DIAGNOSIS — D631 Anemia in chronic kidney disease: Secondary | ICD-10-CM | POA: Diagnosis not present

## 2017-03-19 DIAGNOSIS — Z79899 Other long term (current) drug therapy: Secondary | ICD-10-CM | POA: Diagnosis not present

## 2017-03-19 DIAGNOSIS — K5900 Constipation, unspecified: Secondary | ICD-10-CM | POA: Diagnosis not present

## 2017-03-19 DIAGNOSIS — D696 Thrombocytopenia, unspecified: Secondary | ICD-10-CM | POA: Diagnosis not present

## 2017-03-19 DIAGNOSIS — E785 Hyperlipidemia, unspecified: Secondary | ICD-10-CM | POA: Diagnosis present

## 2017-03-19 DIAGNOSIS — R34 Anuria and oliguria: Secondary | ICD-10-CM | POA: Diagnosis not present

## 2017-03-19 DIAGNOSIS — S301XXA Contusion of abdominal wall, initial encounter: Secondary | ICD-10-CM | POA: Diagnosis not present

## 2017-03-19 DIAGNOSIS — Z6835 Body mass index (BMI) 35.0-35.9, adult: Secondary | ICD-10-CM | POA: Diagnosis not present

## 2017-03-19 DIAGNOSIS — I480 Paroxysmal atrial fibrillation: Secondary | ICD-10-CM | POA: Diagnosis not present

## 2017-03-19 DIAGNOSIS — R5383 Other fatigue: Secondary | ICD-10-CM | POA: Diagnosis present

## 2017-03-19 DIAGNOSIS — Z940 Kidney transplant status: Secondary | ICD-10-CM | POA: Diagnosis not present

## 2017-03-19 DIAGNOSIS — Z794 Long term (current) use of insulin: Secondary | ICD-10-CM | POA: Diagnosis not present

## 2017-03-19 DIAGNOSIS — D6959 Other secondary thrombocytopenia: Secondary | ICD-10-CM | POA: Diagnosis present

## 2017-03-19 DIAGNOSIS — I5030 Unspecified diastolic (congestive) heart failure: Secondary | ICD-10-CM | POA: Diagnosis not present

## 2017-03-19 DIAGNOSIS — E1122 Type 2 diabetes mellitus with diabetic chronic kidney disease: Secondary | ICD-10-CM | POA: Diagnosis not present

## 2017-03-19 DIAGNOSIS — I517 Cardiomegaly: Secondary | ICD-10-CM | POA: Diagnosis not present

## 2017-03-19 DIAGNOSIS — Z952 Presence of prosthetic heart valve: Secondary | ICD-10-CM | POA: Diagnosis not present

## 2017-03-19 DIAGNOSIS — Z5181 Encounter for therapeutic drug level monitoring: Secondary | ICD-10-CM | POA: Diagnosis not present

## 2017-03-19 DIAGNOSIS — N179 Acute kidney failure, unspecified: Secondary | ICD-10-CM | POA: Diagnosis not present

## 2017-03-19 DIAGNOSIS — M7981 Nontraumatic hematoma of soft tissue: Secondary | ICD-10-CM | POA: Diagnosis not present

## 2017-03-19 DIAGNOSIS — I110 Hypertensive heart disease with heart failure: Secondary | ICD-10-CM | POA: Diagnosis present

## 2017-03-19 DIAGNOSIS — R3912 Poor urinary stream: Secondary | ICD-10-CM | POA: Diagnosis not present

## 2017-03-19 DIAGNOSIS — R2240 Localized swelling, mass and lump, unspecified lower limb: Secondary | ICD-10-CM | POA: Diagnosis not present

## 2017-03-19 DIAGNOSIS — Z951 Presence of aortocoronary bypass graft: Secondary | ICD-10-CM | POA: Diagnosis not present

## 2017-03-19 DIAGNOSIS — Z8673 Personal history of transient ischemic attack (TIA), and cerebral infarction without residual deficits: Secondary | ICD-10-CM | POA: Diagnosis not present

## 2017-03-19 DIAGNOSIS — I447 Left bundle-branch block, unspecified: Secondary | ICD-10-CM | POA: Diagnosis present

## 2017-03-19 DIAGNOSIS — J90 Pleural effusion, not elsewhere classified: Secondary | ICD-10-CM | POA: Diagnosis not present

## 2017-03-19 DIAGNOSIS — I454 Nonspecific intraventricular block: Secondary | ICD-10-CM | POA: Diagnosis not present

## 2017-03-19 DIAGNOSIS — K8689 Other specified diseases of pancreas: Secondary | ICD-10-CM | POA: Diagnosis not present

## 2017-03-19 DIAGNOSIS — T8612 Kidney transplant failure: Secondary | ICD-10-CM | POA: Diagnosis not present

## 2017-03-19 DIAGNOSIS — I10 Essential (primary) hypertension: Secondary | ICD-10-CM | POA: Diagnosis not present

## 2017-03-19 DIAGNOSIS — I272 Pulmonary hypertension, unspecified: Secondary | ICD-10-CM | POA: Diagnosis present

## 2017-03-19 DIAGNOSIS — I2510 Atherosclerotic heart disease of native coronary artery without angina pectoris: Secondary | ICD-10-CM | POA: Diagnosis not present

## 2017-03-19 DIAGNOSIS — I4891 Unspecified atrial fibrillation: Secondary | ICD-10-CM | POA: Diagnosis not present

## 2017-03-19 DIAGNOSIS — Z7901 Long term (current) use of anticoagulants: Secondary | ICD-10-CM | POA: Diagnosis not present

## 2017-03-19 DIAGNOSIS — I1311 Hypertensive heart and chronic kidney disease without heart failure, with stage 5 chronic kidney disease, or end stage renal disease: Secondary | ICD-10-CM | POA: Diagnosis not present

## 2017-03-19 DIAGNOSIS — R0989 Other specified symptoms and signs involving the circulatory and respiratory systems: Secondary | ICD-10-CM | POA: Diagnosis not present

## 2017-03-19 DIAGNOSIS — K862 Cyst of pancreas: Secondary | ICD-10-CM | POA: Diagnosis not present

## 2017-03-19 DIAGNOSIS — I132 Hypertensive heart and chronic kidney disease with heart failure and with stage 5 chronic kidney disease, or end stage renal disease: Secondary | ICD-10-CM | POA: Diagnosis not present

## 2017-03-19 DIAGNOSIS — I5033 Acute on chronic diastolic (congestive) heart failure: Secondary | ICD-10-CM | POA: Diagnosis not present

## 2017-03-19 DIAGNOSIS — I481 Persistent atrial fibrillation: Secondary | ICD-10-CM | POA: Diagnosis not present

## 2017-03-19 DIAGNOSIS — R0602 Shortness of breath: Secondary | ICD-10-CM | POA: Diagnosis not present

## 2017-03-19 DIAGNOSIS — I120 Hypertensive chronic kidney disease with stage 5 chronic kidney disease or end stage renal disease: Secondary | ICD-10-CM | POA: Diagnosis not present

## 2017-03-19 DIAGNOSIS — R188 Other ascites: Secondary | ICD-10-CM | POA: Diagnosis not present

## 2017-03-19 DIAGNOSIS — N186 End stage renal disease: Secondary | ICD-10-CM | POA: Diagnosis not present

## 2017-03-19 LAB — BASIC METABOLIC PANEL
BUN: 4 (ref 4–21)
CREATININE: 3.6 — AB (ref <=1.3)
GLUCOSE: 157
POTASSIUM: 5.2 (ref 3.4–5.3)
Sodium: 128 — AB (ref 137–147)

## 2017-03-19 LAB — HEPATIC FUNCTION PANEL
ALT: 8 — AB (ref 10–40)
AST: 14 (ref 14–40)
Alkaline Phosphatase: 78 (ref 25–125)
Bilirubin, Total: 0.6

## 2017-03-19 NOTE — Telephone Encounter (Signed)
Pt tells me that he is currently still at [facility] and not sure how long he will be there.  States to cancel DCCV tomorrow as he is for sure staying overnight, but not sure how long is will be admitted for. Details of admission not discussed w/ pt Pt and I will follow up next week to see how he is doing and if/when DCCV be rescheduled. Pt agreeable to plan.

## 2017-03-19 NOTE — Telephone Encounter (Signed)
New message    Ralene Muskrat PA at [facility] is calling stating she saw pt in office today. He was not feeling well and his kidney function is worse, so she sent him to [facility] ER. She states that pt is scheduled for procedure tomorrow but he may not be able to make it.

## 2017-03-20 ENCOUNTER — Ambulatory Visit (HOSPITAL_COMMUNITY): Admission: RE | Admit: 2017-03-20 | Payer: Medicare Other | Source: Ambulatory Visit | Admitting: Internal Medicine

## 2017-03-20 ENCOUNTER — Encounter (HOSPITAL_COMMUNITY): Admission: RE | Payer: Self-pay | Source: Ambulatory Visit

## 2017-03-20 SURGERY — CARDIOVERSION
Anesthesia: Monitor Anesthesia Care

## 2017-03-23 LAB — CBC AND DIFFERENTIAL
HCT: 27 — AB (ref 41–53)
Hemoglobin: 9.1 — AB (ref 13.5–17.5)
Platelets: 99 — AB (ref 150–399)
WBC: 4.8

## 2017-03-28 DIAGNOSIS — Z940 Kidney transplant status: Secondary | ICD-10-CM | POA: Diagnosis not present

## 2017-03-30 ENCOUNTER — Other Ambulatory Visit: Payer: Self-pay | Admitting: Cardiology

## 2017-03-31 ENCOUNTER — Encounter: Payer: Self-pay | Admitting: Family Medicine

## 2017-03-31 ENCOUNTER — Other Ambulatory Visit: Payer: Self-pay | Admitting: Cardiology

## 2017-03-31 NOTE — Telephone Encounter (Signed)
Medication Detail    Disp Refills Start End   amiodarone (PACERONE) 200 MG tablet 90 tablet 3 03/04/2017    Sig - Route: Take 1 tablet (200 mg total) by mouth daily. - Oral   Notes to Pharmacy: This is the maintenance dose. Please fill after patient has completed the loading dose sent in.   E-Prescribing Status: Receipt confirmed by pharmacy (03/04/2017 6:12 PM EDT)   Pharmacy   [id number] [place], [address] AT [place]

## 2017-04-01 ENCOUNTER — Encounter: Payer: Self-pay | Admitting: Family Medicine

## 2017-04-01 DIAGNOSIS — I509 Heart failure, unspecified: Secondary | ICD-10-CM | POA: Diagnosis not present

## 2017-04-01 DIAGNOSIS — Z4822 Encounter for aftercare following kidney transplant: Secondary | ICD-10-CM | POA: Diagnosis not present

## 2017-04-01 DIAGNOSIS — Z951 Presence of aortocoronary bypass graft: Secondary | ICD-10-CM | POA: Diagnosis not present

## 2017-04-01 DIAGNOSIS — Z9889 Other specified postprocedural states: Secondary | ICD-10-CM | POA: Diagnosis not present

## 2017-04-01 DIAGNOSIS — I4891 Unspecified atrial fibrillation: Secondary | ICD-10-CM | POA: Diagnosis not present

## 2017-04-01 DIAGNOSIS — E119 Type 2 diabetes mellitus without complications: Secondary | ICD-10-CM | POA: Diagnosis not present

## 2017-04-01 DIAGNOSIS — I129 Hypertensive chronic kidney disease with stage 1 through stage 4 chronic kidney disease, or unspecified chronic kidney disease: Secondary | ICD-10-CM | POA: Diagnosis not present

## 2017-04-01 DIAGNOSIS — E1121 Type 2 diabetes mellitus with diabetic nephropathy: Secondary | ICD-10-CM | POA: Diagnosis not present

## 2017-04-01 DIAGNOSIS — Z940 Kidney transplant status: Secondary | ICD-10-CM | POA: Diagnosis not present

## 2017-04-01 DIAGNOSIS — Z952 Presence of prosthetic heart valve: Secondary | ICD-10-CM | POA: Diagnosis not present

## 2017-04-01 DIAGNOSIS — E039 Hypothyroidism, unspecified: Secondary | ICD-10-CM | POA: Diagnosis not present

## 2017-04-01 DIAGNOSIS — Z794 Long term (current) use of insulin: Secondary | ICD-10-CM | POA: Diagnosis not present

## 2017-04-01 DIAGNOSIS — I10 Essential (primary) hypertension: Secondary | ICD-10-CM | POA: Diagnosis not present

## 2017-04-01 DIAGNOSIS — N186 End stage renal disease: Secondary | ICD-10-CM | POA: Diagnosis not present

## 2017-04-01 DIAGNOSIS — D8989 Other specified disorders involving the immune mechanism, not elsewhere classified: Secondary | ICD-10-CM | POA: Diagnosis not present

## 2017-04-01 DIAGNOSIS — I2510 Atherosclerotic heart disease of native coronary artery without angina pectoris: Secondary | ICD-10-CM | POA: Diagnosis not present

## 2017-04-01 DIAGNOSIS — Z8673 Personal history of transient ischemic attack (TIA), and cerebral infarction without residual deficits: Secondary | ICD-10-CM | POA: Diagnosis not present

## 2017-04-01 DIAGNOSIS — R609 Edema, unspecified: Secondary | ICD-10-CM | POA: Diagnosis not present

## 2017-04-01 DIAGNOSIS — E785 Hyperlipidemia, unspecified: Secondary | ICD-10-CM | POA: Diagnosis not present

## 2017-04-01 DIAGNOSIS — E1122 Type 2 diabetes mellitus with diabetic chronic kidney disease: Secondary | ICD-10-CM | POA: Diagnosis not present

## 2017-04-01 DIAGNOSIS — I132 Hypertensive heart and chronic kidney disease with heart failure and with stage 5 chronic kidney disease, or end stage renal disease: Secondary | ICD-10-CM | POA: Diagnosis not present

## 2017-04-01 DIAGNOSIS — N179 Acute kidney failure, unspecified: Secondary | ICD-10-CM | POA: Diagnosis not present

## 2017-04-01 DIAGNOSIS — Z953 Presence of xenogenic heart valve: Secondary | ICD-10-CM | POA: Diagnosis not present

## 2017-04-01 DIAGNOSIS — Z79899 Other long term (current) drug therapy: Secondary | ICD-10-CM | POA: Diagnosis not present

## 2017-04-01 DIAGNOSIS — Z7982 Long term (current) use of aspirin: Secondary | ICD-10-CM | POA: Diagnosis not present

## 2017-04-01 DIAGNOSIS — Z792 Long term (current) use of antibiotics: Secondary | ICD-10-CM | POA: Diagnosis not present

## 2017-04-01 DIAGNOSIS — D899 Disorder involving the immune mechanism, unspecified: Secondary | ICD-10-CM | POA: Diagnosis not present

## 2017-04-01 DIAGNOSIS — Z7901 Long term (current) use of anticoagulants: Secondary | ICD-10-CM | POA: Diagnosis not present

## 2017-04-01 NOTE — Telephone Encounter (Signed)
amiodarone (PACERONE) 200 MG tablet  Medication  Date: 03/04/2017 Department: [address] Office Ordering/Authorizing: Constance Haw, MD  Order Providers   Prescribing Provider Encounter Provider  Constance Haw, MD Constance Haw, MD  Medication Detail    Disp Refills Start End   amiodarone (PACERONE) 200 MG tablet 90 tablet 3 03/04/2017    Sig - Route: Take 1 tablet (200 mg total) by mouth daily. - Oral   Notes to Pharmacy: This is the maintenance dose. Please fill after patient has completed the loading dose sent in.   E-Prescribing Status: Receipt confirmed by pharmacy (03/04/2017 6:12 PM EDT)   Pharmacy   [id number] [place], [address] AT [place] Encounter  Priority and Order Details   Maintenance Dose sent as a separate RX. Refill is not appropriate on the RX

## 2017-04-04 ENCOUNTER — Other Ambulatory Visit: Payer: Self-pay | Admitting: Cardiology

## 2017-04-04 DIAGNOSIS — R600 Localized edema: Secondary | ICD-10-CM | POA: Diagnosis not present

## 2017-04-04 DIAGNOSIS — I132 Hypertensive heart and chronic kidney disease with heart failure and with stage 5 chronic kidney disease, or end stage renal disease: Secondary | ICD-10-CM | POA: Diagnosis not present

## 2017-04-04 DIAGNOSIS — I151 Hypertension secondary to other renal disorders: Secondary | ICD-10-CM | POA: Diagnosis not present

## 2017-04-04 DIAGNOSIS — E039 Hypothyroidism, unspecified: Secondary | ICD-10-CM | POA: Diagnosis not present

## 2017-04-04 DIAGNOSIS — Z794 Long term (current) use of insulin: Secondary | ICD-10-CM | POA: Diagnosis not present

## 2017-04-04 DIAGNOSIS — Z952 Presence of prosthetic heart valve: Secondary | ICD-10-CM | POA: Diagnosis not present

## 2017-04-04 DIAGNOSIS — N186 End stage renal disease: Secondary | ICD-10-CM | POA: Diagnosis not present

## 2017-04-04 DIAGNOSIS — E1121 Type 2 diabetes mellitus with diabetic nephropathy: Secondary | ICD-10-CM | POA: Diagnosis not present

## 2017-04-04 DIAGNOSIS — N2889 Other specified disorders of kidney and ureter: Secondary | ICD-10-CM | POA: Diagnosis not present

## 2017-04-04 DIAGNOSIS — B349 Viral infection, unspecified: Secondary | ICD-10-CM | POA: Diagnosis not present

## 2017-04-04 DIAGNOSIS — Z7901 Long term (current) use of anticoagulants: Secondary | ICD-10-CM | POA: Diagnosis not present

## 2017-04-04 DIAGNOSIS — D696 Thrombocytopenia, unspecified: Secondary | ICD-10-CM | POA: Diagnosis not present

## 2017-04-04 DIAGNOSIS — Z953 Presence of xenogenic heart valve: Secondary | ICD-10-CM | POA: Diagnosis not present

## 2017-04-04 DIAGNOSIS — D899 Disorder involving the immune mechanism, unspecified: Secondary | ICD-10-CM | POA: Diagnosis not present

## 2017-04-04 DIAGNOSIS — Z7982 Long term (current) use of aspirin: Secondary | ICD-10-CM | POA: Diagnosis not present

## 2017-04-04 DIAGNOSIS — I509 Heart failure, unspecified: Secondary | ICD-10-CM | POA: Diagnosis not present

## 2017-04-04 DIAGNOSIS — I4891 Unspecified atrial fibrillation: Secondary | ICD-10-CM | POA: Diagnosis not present

## 2017-04-04 DIAGNOSIS — Z951 Presence of aortocoronary bypass graft: Secondary | ICD-10-CM | POA: Diagnosis not present

## 2017-04-04 DIAGNOSIS — Z4822 Encounter for aftercare following kidney transplant: Secondary | ICD-10-CM | POA: Diagnosis not present

## 2017-04-04 DIAGNOSIS — E669 Obesity, unspecified: Secondary | ICD-10-CM | POA: Diagnosis not present

## 2017-04-04 DIAGNOSIS — I2510 Atherosclerotic heart disease of native coronary artery without angina pectoris: Secondary | ICD-10-CM | POA: Diagnosis not present

## 2017-04-04 DIAGNOSIS — Z6834 Body mass index (BMI) 34.0-34.9, adult: Secondary | ICD-10-CM | POA: Diagnosis not present

## 2017-04-04 DIAGNOSIS — Z940 Kidney transplant status: Secondary | ICD-10-CM | POA: Diagnosis not present

## 2017-04-04 DIAGNOSIS — Z79899 Other long term (current) drug therapy: Secondary | ICD-10-CM | POA: Diagnosis not present

## 2017-04-04 DIAGNOSIS — N179 Acute kidney failure, unspecified: Secondary | ICD-10-CM | POA: Diagnosis not present

## 2017-04-04 DIAGNOSIS — I10 Essential (primary) hypertension: Secondary | ICD-10-CM | POA: Diagnosis not present

## 2017-04-04 DIAGNOSIS — E1122 Type 2 diabetes mellitus with diabetic chronic kidney disease: Secondary | ICD-10-CM | POA: Diagnosis not present

## 2017-04-04 DIAGNOSIS — E785 Hyperlipidemia, unspecified: Secondary | ICD-10-CM | POA: Diagnosis not present

## 2017-04-04 DIAGNOSIS — Z8673 Personal history of transient ischemic attack (TIA), and cerebral infarction without residual deficits: Secondary | ICD-10-CM | POA: Diagnosis not present

## 2017-04-08 DIAGNOSIS — S301XXA Contusion of abdominal wall, initial encounter: Secondary | ICD-10-CM | POA: Diagnosis not present

## 2017-04-08 DIAGNOSIS — E119 Type 2 diabetes mellitus without complications: Secondary | ICD-10-CM | POA: Diagnosis not present

## 2017-04-08 DIAGNOSIS — I10 Essential (primary) hypertension: Secondary | ICD-10-CM | POA: Diagnosis not present

## 2017-04-08 DIAGNOSIS — Z940 Kidney transplant status: Secondary | ICD-10-CM | POA: Diagnosis not present

## 2017-04-08 DIAGNOSIS — D631 Anemia in chronic kidney disease: Secondary | ICD-10-CM | POA: Diagnosis not present

## 2017-04-08 DIAGNOSIS — I5040 Unspecified combined systolic (congestive) and diastolic (congestive) heart failure: Secondary | ICD-10-CM | POA: Diagnosis not present

## 2017-04-08 DIAGNOSIS — I481 Persistent atrial fibrillation: Secondary | ICD-10-CM | POA: Diagnosis not present

## 2017-04-08 LAB — CBC AND DIFFERENTIAL
HCT: 30 — AB (ref 41–53)
Hemoglobin: 10 — AB (ref 13.5–17.5)
NEUTROS ABS: 5
PLATELETS: 132 — AB (ref 150–399)
WBC: 6.3

## 2017-04-08 LAB — BASIC METABOLIC PANEL
BUN: 36 — AB (ref 4–21)
Creatinine: 3 — AB (ref 0.6–1.3)
GLUCOSE: 199
POTASSIUM: 4.7 (ref 3.4–5.3)
SODIUM: 134 — AB (ref 137–147)

## 2017-04-08 LAB — HEPATIC FUNCTION PANEL
ALK PHOS: 107 (ref 25–125)
ALT: 7 — AB (ref 10–40)
AST: 14 (ref 14–40)
Bilirubin, Total: 0.7

## 2017-04-08 NOTE — Telephone Encounter (Signed)
Pt had kidney transplant 6/15 Will follow up with patient in several weeks

## 2017-04-10 ENCOUNTER — Telehealth: Payer: Self-pay | Admitting: Family Medicine

## 2017-04-10 NOTE — Telephone Encounter (Signed)
Pts daughter is calling stating that the pt is needing a referral to see Dr. Jacinto Reap need to be seen as soon as possible.

## 2017-04-10 NOTE — Telephone Encounter (Signed)
He is already a patient in the cardiology office so he does not need another referral . Just call them and make an appt

## 2017-04-10 NOTE — Telephone Encounter (Signed)
Pt needs new rxs lantus solostar pen and novolog flex pen send to [facility] out patient pharm phone [phone]

## 2017-04-10 NOTE — Telephone Encounter (Signed)
I sent pt a my chart message with below information.  

## 2017-04-11 MED ORDER — INSULIN GLARGINE 100 UNIT/ML SOLOSTAR PEN: PEN_INJECTOR | SUBCUTANEOUS | 3 refills | Status: DC

## 2017-04-11 MED ORDER — INSULIN ASPART 100 UNIT/ML FLEXPEN: 5.0000 [IU] | PEN_INJECTOR | Freq: Two times a day (BID) | SUBCUTANEOUS | 3 refills | Status: DC

## 2017-04-11 NOTE — Telephone Encounter (Signed)
I spoke with pt and sent both scripts e-scribe to requested pharmacy, per pt takes 5-10 units bid with meals per sliding scale.

## 2017-04-14 ENCOUNTER — Encounter: Payer: Self-pay | Admitting: Family Medicine

## 2017-04-15 ENCOUNTER — Telehealth: Payer: Self-pay | Admitting: Cardiology

## 2017-04-15 MED ORDER — APIXABAN 5 MG PO TABS: 5.0000 mg | ORAL_TABLET | Freq: Two times a day (BID) | ORAL | 1 refills | Status: DC

## 2017-04-15 NOTE — Telephone Encounter (Signed)
New message     *STAT* If patient is at the pharmacy, call can be transferred to refill team.   1. Which medications need to be refilled? (please list name of each medication and dose if known) Eliquis 2.5 mg  2. Which pharmacy/location (including street and city if local pharmacy) is medication to be sent to? [place]  3. Do they need a 30 day or 90 day supply? 30 days

## 2017-04-17 ENCOUNTER — Other Ambulatory Visit (HOSPITAL_COMMUNITY): Payer: Self-pay

## 2017-04-17 ENCOUNTER — Telehealth: Payer: Self-pay | Admitting: *Deleted

## 2017-04-17 MED ORDER — APIXABAN 2.5 MG PO TABS: 2.5000 mg | ORAL_TABLET | Freq: Two times a day (BID) | ORAL | 2 refills | Status: DC

## 2017-04-17 NOTE — Telephone Encounter (Signed)
Dr. Mliss Sax, nephrologist w/ [facility], called to advise on Eliquis dosing for patient.  Per their d/c instructions (post kidney transplant on 6/15) pt was to be on reduced dose of  2.5 secondary to kidney fxn.  Reviewed with Dr. Curt Bears who agrees with reduced dose.  Will send in new rx for 2.5 and continue to monitor pt and kidney fxn to determine when we can increase back to full dose once kidney fxn improves.  Forwarding to Dr. Curt Bears for advisement on ASA.  Should pt be taking ASA and Eliquis??

## 2017-04-18 ENCOUNTER — Ambulatory Visit (HOSPITAL_COMMUNITY)
Admission: RE | Admit: 2017-04-18 | Discharge: 2017-04-18 | Disposition: A | Discharge status: Home / Self Care | Payer: Medicare Other | Source: Ambulatory Visit | Attending: Nephrology | Admitting: Nephrology

## 2017-04-18 ENCOUNTER — Telehealth: Payer: Self-pay | Admitting: *Deleted

## 2017-04-18 ENCOUNTER — Other Ambulatory Visit (HOSPITAL_COMMUNITY): Payer: Self-pay

## 2017-04-18 ENCOUNTER — Telehealth (HOSPITAL_COMMUNITY): Payer: Self-pay | Admitting: Vascular Surgery

## 2017-04-18 DIAGNOSIS — D631 Anemia in chronic kidney disease: Secondary | ICD-10-CM | POA: Insufficient documentation

## 2017-04-18 DIAGNOSIS — N189 Chronic kidney disease, unspecified: Secondary | ICD-10-CM | POA: Insufficient documentation

## 2017-04-18 DIAGNOSIS — I481 Persistent atrial fibrillation: Secondary | ICD-10-CM | POA: Diagnosis not present

## 2017-04-18 DIAGNOSIS — S301XXA Contusion of abdominal wall, initial encounter: Secondary | ICD-10-CM | POA: Diagnosis not present

## 2017-04-18 DIAGNOSIS — Z940 Kidney transplant status: Secondary | ICD-10-CM | POA: Insufficient documentation

## 2017-04-18 DIAGNOSIS — I5040 Unspecified combined systolic (congestive) and diastolic (congestive) heart failure: Secondary | ICD-10-CM | POA: Diagnosis not present

## 2017-04-18 DIAGNOSIS — E119 Type 2 diabetes mellitus without complications: Secondary | ICD-10-CM | POA: Diagnosis not present

## 2017-04-18 LAB — RENAL FUNCTION PANEL
ALBUMIN: 4.2 g/dL (ref 3.5–5.0)
ANION GAP: 9 (ref 5–15)
BUN: 33 mg/dL — AB (ref 6–20)
CHLORIDE: 101 mmol/L (ref 101–111)
CO2: 24 mmol/L (ref 22–32)
Calcium: 8.9 mg/dL (ref 8.9–10.3)
Creatinine, Ser: 3.22 mg/dL — ABNORMAL HIGH (ref 0.61–1.24)
GFR calc Af Amer: 20 mL/min — ABNORMAL LOW (ref >60)
GFR calc non Af Amer: 17 mL/min — ABNORMAL LOW (ref >60)
GLUCOSE: 146 mg/dL — AB (ref 65–99)
PHOSPHORUS: 4.7 mg/dL — AB (ref 2.5–4.6)
POTASSIUM: 4.4 mmol/L (ref 3.5–5.1)
Sodium: 134 mmol/L — ABNORMAL LOW (ref 135–145)

## 2017-04-18 LAB — CBC
HEMATOCRIT: 32.8 % — AB (ref 39.0–52.0)
HEMOGLOBIN: 10.5 g/dL — AB (ref 13.0–17.0)
MCH: 29.9 pg (ref 26.0–34.0)
MCHC: 32 g/dL (ref 30.0–36.0)
MCV: 93.4 fL (ref 78.0–100.0)
Platelets: 110 10*3/uL — ABNORMAL LOW (ref 150–400)
RBC: 3.51 MIL/uL — ABNORMAL LOW (ref 4.22–5.81)
RDW: 17.4 % — AB (ref 11.5–15.5)
WBC: 6 10*3/uL (ref 4.0–10.5)

## 2017-04-18 MED ORDER — SODIUM CHLORIDE 0.9 % IV SOLN
510.0000 mg | Freq: Once | INTRAVENOUS | Status: AC
  Administered 2017-04-18: 510 mg via INTRAVENOUS
  Filled 2017-04-18: qty 17

## 2017-04-18 MED ORDER — APIXABAN 2.5 MG PO TABS: 2.5000 mg | ORAL_TABLET | Freq: Two times a day (BID) | ORAL | 0 refills | Status: DC

## 2017-04-18 NOTE — Telephone Encounter (Signed)
New Message      *STAT* If patient is at the pharmacy, call can be transferred to refill team.   1. Which medications need to be refilled? (please list name of each medication and dose if known)  Eluquis 2.5 1 tab 2x daily  2. Which pharmacy/location (including street and city if local pharmacy) is medication to be sent to? [place]   3. Do they need a 30 day or 90 day supply?  [place]

## 2017-04-18 NOTE — Discharge Instructions (Signed)

## 2017-04-18 NOTE — Telephone Encounter (Signed)
Left pt message to make (WORK IN)NP appt w/ [facility] ON 04/25/17

## 2017-04-18 NOTE — Telephone Encounter (Signed)
Specialty pharmacy at [facility] left a msg on the refill vm requesting an rx for the patients eliquis. Please call [phone] or fax [phone]. They were going to send this out to the patient today since patient has been without this medication. Thanks, MI

## 2017-04-23 ENCOUNTER — Other Ambulatory Visit: Payer: Self-pay | Admitting: Cardiology

## 2017-04-25 ENCOUNTER — Other Ambulatory Visit: Payer: Self-pay | Admitting: Family Medicine

## 2017-04-25 ENCOUNTER — Other Ambulatory Visit (HOSPITAL_COMMUNITY): Payer: Self-pay

## 2017-04-25 ENCOUNTER — Encounter (HOSPITAL_COMMUNITY): Payer: Self-pay | Admitting: Internal Medicine

## 2017-04-25 ENCOUNTER — Encounter (HOSPITAL_COMMUNITY): Payer: Self-pay | Admitting: Cardiology

## 2017-04-25 ENCOUNTER — Ambulatory Visit (HOSPITAL_COMMUNITY)
Admission: RE | Admit: 2017-04-25 | Discharge: 2017-04-25 | Disposition: A | Discharge status: Home / Self Care | Payer: Medicare Other | Source: Ambulatory Visit | Attending: Internal Medicine | Admitting: Internal Medicine

## 2017-04-25 VITALS — BP 124/60 | HR 65 | Ht 71.0 in | Wt 225.0 lb

## 2017-04-25 DIAGNOSIS — Z953 Presence of xenogenic heart valve: Secondary | ICD-10-CM | POA: Insufficient documentation

## 2017-04-25 DIAGNOSIS — Z8249 Family history of ischemic heart disease and other diseases of the circulatory system: Secondary | ICD-10-CM | POA: Insufficient documentation

## 2017-04-25 DIAGNOSIS — Z9889 Other specified postprocedural states: Secondary | ICD-10-CM | POA: Insufficient documentation

## 2017-04-25 DIAGNOSIS — E785 Hyperlipidemia, unspecified: Secondary | ICD-10-CM | POA: Insufficient documentation

## 2017-04-25 DIAGNOSIS — Z6831 Body mass index (BMI) 31.0-31.9, adult: Secondary | ICD-10-CM | POA: Diagnosis not present

## 2017-04-25 DIAGNOSIS — I5040 Unspecified combined systolic (congestive) and diastolic (congestive) heart failure: Secondary | ICD-10-CM | POA: Diagnosis not present

## 2017-04-25 DIAGNOSIS — Z7902 Long term (current) use of antithrombotics/antiplatelets: Secondary | ICD-10-CM | POA: Diagnosis not present

## 2017-04-25 DIAGNOSIS — R0683 Snoring: Secondary | ICD-10-CM | POA: Diagnosis not present

## 2017-04-25 DIAGNOSIS — Z7982 Long term (current) use of aspirin: Secondary | ICD-10-CM | POA: Diagnosis not present

## 2017-04-25 DIAGNOSIS — I480 Paroxysmal atrial fibrillation: Secondary | ICD-10-CM | POA: Insufficient documentation

## 2017-04-25 DIAGNOSIS — I132 Hypertensive heart and chronic kidney disease with heart failure and with stage 5 chronic kidney disease, or end stage renal disease: Secondary | ICD-10-CM | POA: Insufficient documentation

## 2017-04-25 DIAGNOSIS — I252 Old myocardial infarction: Secondary | ICD-10-CM | POA: Insufficient documentation

## 2017-04-25 DIAGNOSIS — I272 Pulmonary hypertension, unspecified: Secondary | ICD-10-CM | POA: Diagnosis not present

## 2017-04-25 DIAGNOSIS — S301XXA Contusion of abdominal wall, initial encounter: Secondary | ICD-10-CM | POA: Diagnosis not present

## 2017-04-25 DIAGNOSIS — E1122 Type 2 diabetes mellitus with diabetic chronic kidney disease: Secondary | ICD-10-CM | POA: Insufficient documentation

## 2017-04-25 DIAGNOSIS — Z940 Kidney transplant status: Secondary | ICD-10-CM | POA: Diagnosis not present

## 2017-04-25 DIAGNOSIS — E669 Obesity, unspecified: Secondary | ICD-10-CM | POA: Diagnosis not present

## 2017-04-25 DIAGNOSIS — Z800 Family history of malignant neoplasm of digestive organs: Secondary | ICD-10-CM | POA: Insufficient documentation

## 2017-04-25 DIAGNOSIS — I5033 Acute on chronic diastolic (congestive) heart failure: Secondary | ICD-10-CM

## 2017-04-25 DIAGNOSIS — Z8673 Personal history of transient ischemic attack (TIA), and cerebral infarction without residual deficits: Secondary | ICD-10-CM | POA: Diagnosis not present

## 2017-04-25 DIAGNOSIS — Z951 Presence of aortocoronary bypass graft: Secondary | ICD-10-CM | POA: Diagnosis not present

## 2017-04-25 DIAGNOSIS — N186 End stage renal disease: Secondary | ICD-10-CM | POA: Insufficient documentation

## 2017-04-25 DIAGNOSIS — I350 Nonrheumatic aortic (valve) stenosis: Secondary | ICD-10-CM | POA: Diagnosis not present

## 2017-04-25 DIAGNOSIS — N183 Chronic kidney disease, stage 3 unspecified: Secondary | ICD-10-CM

## 2017-04-25 DIAGNOSIS — M109 Gout, unspecified: Secondary | ICD-10-CM | POA: Diagnosis not present

## 2017-04-25 DIAGNOSIS — I481 Persistent atrial fibrillation: Secondary | ICD-10-CM | POA: Diagnosis not present

## 2017-04-25 DIAGNOSIS — E119 Type 2 diabetes mellitus without complications: Secondary | ICD-10-CM | POA: Diagnosis not present

## 2017-04-25 DIAGNOSIS — I2510 Atherosclerotic heart disease of native coronary artery without angina pectoris: Secondary | ICD-10-CM | POA: Diagnosis not present

## 2017-04-25 DIAGNOSIS — Z880 Allergy status to penicillin: Secondary | ICD-10-CM | POA: Diagnosis not present

## 2017-04-25 DIAGNOSIS — Z794 Long term (current) use of insulin: Secondary | ICD-10-CM | POA: Insufficient documentation

## 2017-04-25 DIAGNOSIS — D631 Anemia in chronic kidney disease: Secondary | ICD-10-CM | POA: Insufficient documentation

## 2017-04-25 DIAGNOSIS — R0600 Dyspnea, unspecified: Secondary | ICD-10-CM

## 2017-04-25 MED ORDER — CARVEDILOL 6.25 MG PO TABS: 6.2500 mg | ORAL_TABLET | Freq: Two times a day (BID) | ORAL | 3 refills | Status: DC

## 2017-04-25 NOTE — Progress Notes (Signed)
i

## 2017-04-25 NOTE — Progress Notes (Signed)
ADVANCED HF CLINIC CONSULT NOTE  Referring Physician: Dr. Lorrene Reid   HPI:  [name] Brown is a 81 y.o. male with diabetes, HTN, ESRD (secondary to DM2 & ATN after CABG /bioprosthetic aortic valve replacement in 2011), CAD and PAF. He is s/p kidney transplant on 02/17/2012. He is referred by Dr. Lorrene Reid for worsening HF>   As above, he underwent bioprosthetic AVR/CABG in 2011. Complicated by progressive renal failure and was placed on HD. Was on HD for 2 years via RUE AVF. Underwent kidney transplant in 4/13 at [facility] and did well.   Unfortunately, developed AF in 2/18 and creatinine jumped from 1.5->2.0. Underwent DC-CV but did not hold. Saw Dr. Curt Bears and was placed on amiodarone with plans for repeat DC-CV in May.   However at the end of May was admitted [facility] for diastiolic HF with question of cardiorenal syndrome. Creatine peaked close to 4.. Echo during that admit showed normal EF with Pulmonary HTN and RV dysfunction. During that admit also developed RP bleed and Eliquis dose subsequently reduced.  Weight on admit to [facility] 251 pounds. Weight on d/c was 238.4 and felt to be still overloaded. still with weight on board. Has been followed closely by Dr. Josephine Igo since that time. On lasix 80 bid lost 6 pounds  Weight actually 225 now but still doesn't feel right. Creatinine 3.2  Tells me that he is SOB with very mild activity. No orthopnea or PND. But struggles with ADLs. Hard to get to the mailbox. Mild ankle edema. Lives alone. Doesn't know if he snores. Very compliant with fluid restrict. SBP 120s. Is adamant that he doesn't want to restart HD. Remains in AF.   Studies: Echo 03/20/17 Left ventricular systolic function is normal.  LV ejection fraction = 55-60%.  The right ventricle is moderately dilated.  The right ventricular systolic function is mildly reduced.  The left atrium is mildly dilated.  The right atrium is mildly dilated.  There is a bioprosthetic aortic valve.  There is  moderate to severe mitral annular calcification.  There is moderate tricuspid regurgitation.  Estimated right ventricular systolic pressure is 75 mmHg.  Estimated right atrial pressure is 10 mmHg..  Moderate to severe pulmonary hypertension.  There is no pericardial effusion.  Probably no significant change in comparison with the prior study noted    ECG: AF 66 LBBB  Review of Systems: [y] = yes, [ ]  = no   General: Weight gain [ ] ; Weight loss Blue.Reese ]; Anorexia [ ] ; Fatigue Blue.Reese ]; Fever [ ] ; Chills [ ] ; Weakness [ ]   Cardiac: Chest pain/pressure [ ] ; Resting SOB [ ] ; Exertional SOB [ y]; Orthopnea [ ] ; Pedal Edema Blue.Reese ]; Palpitations [ ] ; Syncope [ ] ; Presyncope [ ] ; Paroxysmal nocturnal dyspnea[ ]   Pulmonary: Cough [ ] ; Wheezing[ ] ; Hemoptysis[ ] ; Sputum [ ] ; Snoring [ ]   GI: Vomiting[ ] ; Dysphagia[ ] ; Melena[ ] ; Hematochezia [ ] ; Heartburn[ ] ; Abdominal pain [ ] ; Constipation [ ] ; Diarrhea [ ] ; BRBPR [ ]   GU: Hematuria[ ] ; Dysuria [ ] ; Nocturia[ ]   Vascular: Pain in legs with walking [ ] ; Pain in feet with lying flat [ ] ; Non-healing sores [ ] ; Stroke [ ] ; TIA [ ] ; Slurred speech [ ] ;  Neuro: Headaches[ ] ; Vertigo[ ] ; Seizures[ ] ; Paresthesias[ ] ;Blurred vision [ ] ; Diplopia [ ] ; Vision changes [ ]   Ortho/Skin: Arthritis Blue.Reese ]; Joint pain Blue.Reese ]; Muscle pain [ ] ; Joint swelling [ ] ; Back Pain [ ] ; Rash [ ]   Psych: Depression[y ]; Anxiety[ ]   Heme: Bleeding problems [ ] ; Clotting disorders [ ] ; Anemia [ ]   Endocrine: Diabetes Blue.Reese ]; Thyroid dysfunction[ ]    Past Medical History:  Diagnosis Date  . Anemia 01/02/2012  . Anemia associated with chronic renal failure   . Aortic stenosis   . Atrial fibrillation ([place])   . CAD (coronary artery disease)   . Chronic kidney disease    chronic renal failure  . CVA (cerebral infarction) 05/2010  . Diabetes mellitus   . Gout   . Hyperlipidemia   . Hypertension   . Myocardial infarction ([place])   . Obesity   . Shoulder pain, left    sees Dr.  Nickola Major at [facility] in [place]   . Splenomegaly 2012  . Thrombocytopenia ([place]) 2011   sees Dr. Lamonte Sakai  . Thrombocytopenia ([place])     Current Outpatient Prescriptions  Medication Sig Dispense Refill  . allopurinol (ZYLOPRIM) 100 MG tablet Take 300 mg by mouth daily.    Marland Kitchen ALPRAZolam (XANAX) 0.5 MG tablet Take 1 tablet (0.5 mg total) by mouth every 6 (six) hours as needed. (Patient taking differently: Take 0.5 mg by mouth every 6 (six) hours as needed for anxiety. ) 120 tablet 0  . amiodarone (PACERONE) 200 MG tablet Take 1 tablet (200 mg total) by mouth daily. 90 tablet 3  . apixaban (ELIQUIS) 2.5 MG TABS tablet Take 1 tablet (2.5 mg total) by mouth 2 (two) times daily. 180 tablet 0  . aspirin 81 MG tablet Take 81 mg by mouth daily.    . B-D ULTRAFINE III SHORT PEN 31G X 8 MM MISC USE AS DIRECTED TEST 3 TO 4 TIMES PER DAY 100 each 1  . calcium carbonate (TUMS - DOSED IN MG ELEMENTAL CALCIUM) 500 MG chewable tablet Chew 500-1,000 mg by mouth daily after lunch. 1-2 depends on if feeling more gassy will take 2    . carvedilol (COREG) 6.25 MG tablet Take 1 tablet (6.25 mg total) by mouth 2 (two) times daily. 60 tablet 3  . Docusate Sodium (COLACE PO) Take 1 capsule by mouth 2 (two) times daily.     . furosemide (LASIX) 80 MG tablet Take 80 mg by mouth 2 (two) times daily.    . insulin aspart (NOVOLOG FLEXPEN) 100 UNIT/ML FlexPen Inject 5-10 Units into the skin 2 (two) times daily with a meal. 15 mL 3  . Insulin Glargine (LANTUS SOLOSTAR) 100 UNIT/ML Solostar Pen INJECT 30 UNITS AT BEDTIME 15 mL 3  . Krill Oil 1000 MG CAPS Take 1,000 mg by mouth daily at 3 pm.     . levothyroxine (SYNTHROID, LEVOTHROID) 25 MCG tablet Take 25 mcg by mouth every evening.     . mycophenolate (MYFORTIC) 180 MG EC tablet Take 1 tablet (180 mg total) by mouth 2 (two) times daily. 2 tablet 0  . omeprazole (PRILOSEC) 20 MG capsule Take 20 mg by mouth at bedtime.     . ondansetron (ZOFRAN) 4 MG tablet Take  1 tablet (4 mg total) by mouth every 8 (eight) hours as needed for nausea or vomiting. 20 tablet 2  . ONE TOUCH ULTRA TEST test strip TEST 1-3 TIMES PER DAY AND DIAGNOSIS CODE IS E 11.9 100 each 1  . oxyCODONE (OXY IR/ROXICODONE) 5 MG immediate release tablet Take 1 tablet (5 mg total) by mouth every 6 (six) hours as needed for moderate pain. 120 tablet 0  . simvastatin (ZOCOR) 10 MG tablet Take 1 tablet (10 mg  total) by mouth at bedtime. (Patient taking differently: Take 10 mg by mouth daily at 3 pm. ) 1 tablet 0  . tacrolimus (PROGRAF) 0.5 MG capsule Take 0.5 mg by mouth 2 (two) times daily.    . temazepam (RESTORIL) 30 MG capsule TAKE ONE CAPSULE BY MOUTH AT BEDTIME AS NEEDED FILL 12-22-14 (Patient taking differently: TAKE ONE CAPSULE BY MOUTH AT BEDTIME AS NEEDED for sleep FILL 12-22-14) 90 capsule 1  . traMADol (ULTRAM) 50 MG tablet TAKE 2 TABLETS BY MOUTH EVERY 6 HOURS AS NEEDED (Patient taking differently: TAKE 2 TABLETS BY MOUTH EVERY 6 HOURS AS NEEDED for pain) 240 tablet 1  . amiodarone (PACERONE) 200 MG tablet TAKE 2 TABLETS TWICE DAILY X2 WEEKS, THEN REDUCE TO 2 TABLETS ONCE DAILY FOR 2 WEEK (Patient not taking: Reported on 04/25/2017) 84 tablet 0  . carvedilol (COREG) 12.5 MG tablet Take 6.25 mg by mouth 2 (two) times daily with a meal.    . diltiazem (CARDIZEM CD) 120 MG 24 hr capsule Take 1 capsule (120 mg total) by mouth daily. 90 capsule 3  . diltiazem (CARDIZEM CD) 120 MG 24 hr capsule Take 120 mg by mouth at bedtime.    . docusate sodium (COLACE) 100 MG capsule Take 100 mg by mouth at bedtime.    . polyethylene glycol powder (GLYCOLAX/MIRALAX) powder Take 17 g by mouth daily.  0  . sulfamethoxazole-trimethoprim (BACTRIM,SEPTRA) 400-80 MG tablet Take 1 tablet by mouth every Monday, Wednesday, and Friday. Takes for kidney rejection  5   No current facility-administered medications for this encounter.     Allergies  Allergen Reactions  . Penicillins Hives    Pt was 81 years old  Has  patient had a PCN reaction causing immediate rash, facial/tongue/throat swelling, SOB or lightheadedness with hypotension: Unknown Has patient had a PCN reaction causing severe rash involving mucus membranes or skin necrosis: Unknown Has patient had a PCN reaction that required hospitalization: No Has patient had a PCN reaction occurring within the last 10 years: No If all of the above answers are "NO", then may proceed with Cephalosporin use.       Social History   Social History  . Marital status: Widowed    Spouse name: N/A  . Number of children: N/A  . Years of education: N/A   Occupational History  . Not on file.   Social History Main Topics  . Smoking status: Never Smoker  . Smokeless tobacco: Never Used  . Alcohol use No  . Drug use: No  . Sexual activity: Not on file   Other Topics Concern  . Not on file   Social History Narrative  . No narrative on file      Family History  Problem Relation Age of Onset  . Liver disease Father   . Cancer Unknown        colon/fhx  . Hypertension Unknown        fhx  . Coronary artery disease Unknown        fhx    Vitals:   04/25/17 1132  BP: 124/60  Pulse: 65  SpO2: 96%  Weight: 225 lb (102.1 kg)  Height: 5\' 11"  (1.803 m)    PHYSICAL EXAM: General:  Fatigued appearing. No respiratory difficulty HEENT: normal Neck: supple. JVP to jaw  Carotids 2+ bilat; no bruits. No lymphadenopathy or thryomegaly appreciated. Cor: PMI nondisplaced. Irregular rate & rhythm. 2/6 SEM RUSB S2 crisp Lungs: clear Abdomen: obese soft, nontender, nondistended. No hepatosplenomegaly. No  bruits or masses. Good bowel sounds. Extremities: no cyanosis, clubbing, rash, 1+ edema. Large RUE AVF with thrill Neuro: alert & oriented x 3, cranial nerves grossly intact. moves all 4 extremities w/o difficulty. Affect pleasant.   ReDS vest = 45% lung water (normal 20-35%)  ASSESSMENT & PLAN:  1. Acute on chronic diastolic/RV heart failure --NYHA  III-IIIB symptoms --Symptoms and renal function have clearly deteriorated with development of AF so suspect component of cardiorenal syndrome. Unfortunately not sure how much of his renal function we will be able to recover. --Despite recent weight loss of 25 pounds he remains volume overloaded on exam and by ReDS Vest.  --Will plan RHC on Monday to assess R/L sided pressures as well as cardiac output. If low output will admit for short course of inotropic support to try and improve renal function. Matheny numbers will be affected by presence of large AVF.  --With immunosuppression will not be good candidate for chronic PICC and home inotropes --Plan TEE and repeat DC-CV later in week   2. Paroxysmal AF --CHADSVASc = 5 --As above, this has clearly led to worsening of symptoms and renal function --Failed DC-CV in 2/18. Now on amio,. Will repeat DC-CV next week --Eliquis dose dose reduce due to RP bleed byt almost meets criteria for dose reduction anyway with age and CKD. For safety's sake will proceed with TEE prior to DC-CV  3. CAD --stable. No s/s of ischemia   4. AVR --stable on exam and by recent echo  5. ESRD s/p renal transplant in 2013 --followed by Dr. Lorrene Reid --above plan discussed with her at length,.   6. Possible OSA --will need sleep study  Glori Bickers, MD  9:59 PM

## 2017-04-25 NOTE — Patient Instructions (Addendum)
DECREASE Coreg to 6.25 mg, one tab twice a day  Your physician has requested that you have a cardiac catheterization. Cardiac catheterization is used to diagnose and/or treat various heart conditions. Doctors may recommend this procedure for a number of different reasons. The most common reason is to evaluate chest pain. Chest pain can be a symptom of coronary artery disease (CAD), and cardiac catheterization can show whether plaque is narrowing or blocking your heart's arteries. This procedure is also used to evaluate the valves, as well as measure the blood flow and oxygen levels in different parts of your heart. For further information please visit [email or web address]. Please follow instruction sheet, as given.  Your physician has requested that you have a TEE/Cardioversion. During a TEE, sound waves are used to create images of your heart. It provides your doctor with information about the size and shape of your heart and how well your heart's chambers and valves are working. In this test, a transducer is attached to the end of a flexible tube that is guided down you throat and into your esophagus (the tube leading from your mouth to your stomach) to get a more detailed image of your heart. Once the TEE has determined that a blood clot is not present, the cardioversion begins. Electrical Cardioversion uses a jolt of electricity to your heart either through paddles or wired patches attached to your chest. This is a controlled, usually prescheduled, procedure. This procedure is done at the hospital and you are not awake during the procedure. You usually go home the day of the procedure. Please see the instruction sheet given to you today for more information.  Your physician has recommended that you have a sleep study. This test records several body functions during sleep, including: brain activity, eye movement, oxygen and carbon dioxide blood levels, heart rate and rhythm, breathing rate and rhythm, the  flow of air through your mouth and nose, snoring, body muscle movements, and chest and belly movement.  Your physician recommends that you schedule a follow-up appointment in: 6 weeks with Dr Haroldine Laws

## 2017-04-25 NOTE — Progress Notes (Signed)
    ReDS Vest - 04/25/17 1100      ReDS Vest   MR  No   Fitting Posture Standing   Height Marker Tall   Ruler Value 12   Center Strip Shifted   ReDS Value 45

## 2017-04-28 ENCOUNTER — Ambulatory Visit (HOSPITAL_COMMUNITY)
Admission: RE | Admit: 2017-04-28 | Discharge: 2017-04-28 | Disposition: A | Discharge status: Home / Self Care | Payer: Medicare Other | Source: Ambulatory Visit | Attending: Internal Medicine | Admitting: Internal Medicine

## 2017-04-28 ENCOUNTER — Encounter (HOSPITAL_COMMUNITY): Payer: Self-pay | Admitting: *Deleted

## 2017-04-28 ENCOUNTER — Encounter (HOSPITAL_COMMUNITY)
Admission: RE | Disposition: A | Discharge status: Home / Self Care | Payer: Self-pay | Source: Ambulatory Visit | Attending: Internal Medicine

## 2017-04-28 DIAGNOSIS — I252 Old myocardial infarction: Secondary | ICD-10-CM | POA: Diagnosis not present

## 2017-04-28 DIAGNOSIS — Z953 Presence of xenogenic heart valve: Secondary | ICD-10-CM | POA: Insufficient documentation

## 2017-04-28 DIAGNOSIS — Z8673 Personal history of transient ischemic attack (TIA), and cerebral infarction without residual deficits: Secondary | ICD-10-CM | POA: Diagnosis not present

## 2017-04-28 DIAGNOSIS — I132 Hypertensive heart and chronic kidney disease with heart failure and with stage 5 chronic kidney disease, or end stage renal disease: Secondary | ICD-10-CM | POA: Diagnosis not present

## 2017-04-28 DIAGNOSIS — M109 Gout, unspecified: Secondary | ICD-10-CM | POA: Diagnosis not present

## 2017-04-28 DIAGNOSIS — Z7982 Long term (current) use of aspirin: Secondary | ICD-10-CM | POA: Diagnosis not present

## 2017-04-28 DIAGNOSIS — I5033 Acute on chronic diastolic (congestive) heart failure: Secondary | ICD-10-CM | POA: Insufficient documentation

## 2017-04-28 DIAGNOSIS — R0600 Dyspnea, unspecified: Secondary | ICD-10-CM

## 2017-04-28 DIAGNOSIS — E669 Obesity, unspecified: Secondary | ICD-10-CM | POA: Insufficient documentation

## 2017-04-28 DIAGNOSIS — Z940 Kidney transplant status: Secondary | ICD-10-CM | POA: Insufficient documentation

## 2017-04-28 DIAGNOSIS — E1122 Type 2 diabetes mellitus with diabetic chronic kidney disease: Secondary | ICD-10-CM | POA: Insufficient documentation

## 2017-04-28 DIAGNOSIS — N186 End stage renal disease: Secondary | ICD-10-CM | POA: Diagnosis not present

## 2017-04-28 DIAGNOSIS — D696 Thrombocytopenia, unspecified: Secondary | ICD-10-CM | POA: Insufficient documentation

## 2017-04-28 DIAGNOSIS — I272 Pulmonary hypertension, unspecified: Secondary | ICD-10-CM | POA: Insufficient documentation

## 2017-04-28 DIAGNOSIS — Z794 Long term (current) use of insulin: Secondary | ICD-10-CM | POA: Insufficient documentation

## 2017-04-28 DIAGNOSIS — Z951 Presence of aortocoronary bypass graft: Secondary | ICD-10-CM | POA: Insufficient documentation

## 2017-04-28 DIAGNOSIS — D631 Anemia in chronic kidney disease: Secondary | ICD-10-CM | POA: Diagnosis not present

## 2017-04-28 DIAGNOSIS — I5032 Chronic diastolic (congestive) heart failure: Secondary | ICD-10-CM | POA: Diagnosis not present

## 2017-04-28 DIAGNOSIS — E785 Hyperlipidemia, unspecified: Secondary | ICD-10-CM | POA: Insufficient documentation

## 2017-04-28 DIAGNOSIS — Z6831 Body mass index (BMI) 31.0-31.9, adult: Secondary | ICD-10-CM | POA: Insufficient documentation

## 2017-04-28 DIAGNOSIS — Z880 Allergy status to penicillin: Secondary | ICD-10-CM | POA: Insufficient documentation

## 2017-04-28 DIAGNOSIS — Z7901 Long term (current) use of anticoagulants: Secondary | ICD-10-CM | POA: Insufficient documentation

## 2017-04-28 DIAGNOSIS — I2510 Atherosclerotic heart disease of native coronary artery without angina pectoris: Secondary | ICD-10-CM | POA: Diagnosis not present

## 2017-04-28 DIAGNOSIS — I480 Paroxysmal atrial fibrillation: Secondary | ICD-10-CM | POA: Insufficient documentation

## 2017-04-28 HISTORY — PX: RIGHT HEART CATH: CATH118263

## 2017-04-28 LAB — POCT I-STAT 3, VENOUS BLOOD GAS (G3P V)
Acid-Base Excess: 1 mmol/L (ref 0.0–2.0)
Acid-Base Excess: 2 mmol/L (ref 0.0–2.0)
Acid-Base Excess: 3 mmol/L — ABNORMAL HIGH (ref 0.0–2.0)
BICARBONATE: 27.5 mmol/L (ref 20.0–28.0)
BICARBONATE: 27.7 mmol/L (ref 20.0–28.0)
Bicarbonate: 25.3 mmol/L (ref 20.0–28.0)
O2 Saturation: 72 %
O2 Saturation: 73 %
O2 Saturation: 80 %
PH VEN: 7.409 (ref 7.250–7.430)
PH VEN: 7.42 (ref 7.250–7.430)
PO2 VEN: 44 mmHg (ref 32.0–45.0)
TCO2: 26 mmol/L (ref 0–100)
TCO2: 29 mmol/L (ref 0–100)
TCO2: 29 mmol/L (ref 0–100)
pCO2, Ven: 40.1 mmHg — ABNORMAL LOW (ref 44.0–60.0)
pCO2, Ven: 42.8 mmHg — ABNORMAL LOW (ref 44.0–60.0)
pCO2, Ven: 43.5 mmHg — ABNORMAL LOW (ref 44.0–60.0)
pH, Ven: 7.408 (ref 7.250–7.430)
pO2, Ven: 38 mmHg (ref 32.0–45.0)
pO2, Ven: 38 mmHg (ref 32.0–45.0)

## 2017-04-28 LAB — PROTIME-INR
INR: 1.57
Prothrombin Time: 18.9 seconds — ABNORMAL HIGH (ref 11.4–15.2)

## 2017-04-28 LAB — BASIC METABOLIC PANEL
ANION GAP: 11 (ref 5–15)
BUN: 45 mg/dL — ABNORMAL HIGH (ref 6–20)
CALCIUM: 9 mg/dL (ref 8.9–10.3)
CO2: 28 mmol/L (ref 22–32)
Chloride: 102 mmol/L (ref 101–111)
Creatinine, Ser: 3.1 mg/dL — ABNORMAL HIGH (ref 0.61–1.24)
GFR calc non Af Amer: 18 mL/min — ABNORMAL LOW (ref >60)
GFR, EST AFRICAN AMERICAN: 21 mL/min — AB (ref >60)
Glucose, Bld: 145 mg/dL — ABNORMAL HIGH (ref 65–99)
POTASSIUM: 4.2 mmol/L (ref 3.5–5.1)
Sodium: 141 mmol/L (ref 135–145)

## 2017-04-28 LAB — CBC
HEMATOCRIT: 33.2 % — AB (ref 39.0–52.0)
HEMOGLOBIN: 10.3 g/dL — AB (ref 13.0–17.0)
MCH: 29.5 pg (ref 26.0–34.0)
MCHC: 31 g/dL (ref 30.0–36.0)
MCV: 95.1 fL (ref 78.0–100.0)
Platelets: 91 10*3/uL — ABNORMAL LOW (ref 150–400)
RBC: 3.49 MIL/uL — AB (ref 4.22–5.81)
RDW: 17.6 % — ABNORMAL HIGH (ref 11.5–15.5)
WBC: 5.6 10*3/uL (ref 4.0–10.5)

## 2017-04-28 LAB — GLUCOSE, CAPILLARY: Glucose-Capillary: 147 mg/dL — ABNORMAL HIGH (ref 65–99)

## 2017-04-28 SURGERY — RIGHT HEART CATH
Anesthesia: LOCAL

## 2017-04-28 MED ORDER — SODIUM CHLORIDE 0.9 % IV SOLN: 250.0000 mL | INTRAVENOUS | Status: DC | PRN

## 2017-04-28 MED ORDER — SODIUM CHLORIDE 0.9% FLUSH: 3.0000 mL | Freq: Two times a day (BID) | INTRAVENOUS | Status: DC

## 2017-04-28 MED ORDER — MIDAZOLAM HCL 2 MG/2ML IJ SOLN
INTRAMUSCULAR | Status: AC
  Filled 2017-04-28: qty 2

## 2017-04-28 MED ORDER — SODIUM CHLORIDE 0.9% FLUSH: 3.0000 mL | INTRAVENOUS | Status: DC | PRN

## 2017-04-28 MED ORDER — INSULIN ASPART 100 UNIT/ML ~~LOC~~ SOLN: 0.0000 [IU] | SUBCUTANEOUS | Status: DC

## 2017-04-28 MED ORDER — LIDOCAINE HCL (PF) 1 % IJ SOLN
INTRAMUSCULAR | Status: DC | PRN
  Administered 2017-04-28: 15 mL

## 2017-04-28 MED ORDER — MIDAZOLAM HCL 2 MG/2ML IJ SOLN
INTRAMUSCULAR | Status: DC | PRN
  Administered 2017-04-28: 1 mg via INTRAVENOUS

## 2017-04-28 MED ORDER — LIDOCAINE HCL (PF) 1 % IJ SOLN
INTRAMUSCULAR | Status: AC
  Filled 2017-04-28: qty 30

## 2017-04-28 MED ORDER — SODIUM CHLORIDE 0.9 % IV SOLN
INTRAVENOUS | Status: DC
  Administered 2017-04-28: 08:00:00 via INTRAVENOUS

## 2017-04-28 MED ORDER — ONDANSETRON HCL 4 MG/2ML IJ SOLN: 4.0000 mg | Freq: Four times a day (QID) | INTRAMUSCULAR | Status: DC | PRN

## 2017-04-28 MED ORDER — HEPARIN (PORCINE) IN NACL 2-0.9 UNIT/ML-% IJ SOLN
INTRAMUSCULAR | Status: AC
  Filled 2017-04-28: qty 500

## 2017-04-28 MED ORDER — FENTANYL CITRATE (PF) 100 MCG/2ML IJ SOLN
INTRAMUSCULAR | Status: DC | PRN
  Administered 2017-04-28: 25 ug via INTRAVENOUS

## 2017-04-28 MED ORDER — HEPARIN (PORCINE) IN NACL 2-0.9 UNIT/ML-% IJ SOLN
INTRAMUSCULAR | Status: AC | PRN
  Administered 2017-04-28: 500 mL

## 2017-04-28 MED ORDER — ACETAMINOPHEN 325 MG PO TABS: 650.0000 mg | ORAL_TABLET | ORAL | Status: DC | PRN

## 2017-04-28 MED ORDER — FENTANYL CITRATE (PF) 100 MCG/2ML IJ SOLN
INTRAMUSCULAR | Status: AC
  Filled 2017-04-28: qty 2

## 2017-04-28 SURGICAL SUPPLY — 10 items
CATH SWAN GANZ 7F STRAIGHT (CATHETERS) ×1 IMPLANT
COVER PRB 48X5XTLSCP FOLD TPE (BAG) IMPLANT
COVER PROBE 5X48 (BAG) ×2
KIT HEART LEFT (KITS) ×2 IMPLANT
PACK CARDIAC CATHETERIZATION (CUSTOM PROCEDURE TRAY) ×2 IMPLANT
SHEATH PINNACLE 7F 10CM (SHEATH) ×1 IMPLANT
TRANSDUCER W/STOPCOCK (MISCELLANEOUS) ×2 IMPLANT
TUBING ART PRESS 72  MALE/FEM (TUBING) ×1
TUBING ART PRESS 72 MALE/FEM (TUBING) IMPLANT
WIRE EMERALD 3MM-J .025X260CM (WIRE) ×1 IMPLANT

## 2017-04-28 NOTE — H&P (View-Only) (Signed)
ADVANCED HF CLINIC CONSULT NOTE  Referring Physician: Dr. Lorrene Reid   HPI:  [name] Brown is a 81 y.o. male with diabetes, HTN, ESRD (secondary to DM2 & ATN after CABG /bioprosthetic aortic valve replacement in 2011), CAD and PAF. He is s/p kidney transplant on 02/17/2012. He is referred by Dr. Lorrene Reid for worsening HF>   As above, he underwent bioprosthetic AVR/CABG in 2011. Complicated by progressive renal failure and was placed on HD. Was on HD for 2 years via RUE AVF. Underwent kidney transplant in 4/13 at [facility] and did well.   Unfortunately, developed AF in 2/18 and creatinine jumped from 1.5->2.0. Underwent DC-CV but did not hold. Saw Dr. Curt Bears and was placed on amiodarone with plans for repeat DC-CV in May.   However at the end of May was admitted [facility] for diastiolic HF with question of cardiorenal syndrome. Creatine peaked close to 4.. Echo during that admit showed normal EF with Pulmonary HTN and RV dysfunction. During that admit also developed RP bleed and Eliquis dose subsequently reduced.  Weight on admit to [facility] 251 pounds. Weight on d/c was 238.4 and felt to be still overloaded. still with weight on board. Has been followed closely by Dr. Josephine Igo since that time. On lasix 80 bid lost 6 pounds  Weight actually 225 now but still doesn't feel right. Creatinine 3.2  Tells me that he is SOB with very mild activity. No orthopnea or PND. But struggles with ADLs. Hard to get to the mailbox. Mild ankle edema. Lives alone. Doesn't know if he snores. Very compliant with fluid restrict. SBP 120s. Is adamant that he doesn't want to restart HD. Remains in AF.   Studies: Echo 03/20/17 Left ventricular systolic function is normal.  LV ejection fraction = 55-60%.  The right ventricle is moderately dilated.  The right ventricular systolic function is mildly reduced.  The left atrium is mildly dilated.  The right atrium is mildly dilated.  There is a bioprosthetic aortic valve.  There is  moderate to severe mitral annular calcification.  There is moderate tricuspid regurgitation.  Estimated right ventricular systolic pressure is 75 mmHg.  Estimated right atrial pressure is 10 mmHg..  Moderate to severe pulmonary hypertension.  There is no pericardial effusion.  Probably no significant change in comparison with the prior study noted    ECG: AF 66 LBBB  Review of Systems: [y] = yes, [ ]  = no   General: Weight gain [ ] ; Weight loss Blue.Reese ]; Anorexia [ ] ; Fatigue Blue.Reese ]; Fever [ ] ; Chills [ ] ; Weakness [ ]   Cardiac: Chest pain/pressure [ ] ; Resting SOB [ ] ; Exertional SOB [ y]; Orthopnea [ ] ; Pedal Edema Blue.Reese ]; Palpitations [ ] ; Syncope [ ] ; Presyncope [ ] ; Paroxysmal nocturnal dyspnea[ ]   Pulmonary: Cough [ ] ; Wheezing[ ] ; Hemoptysis[ ] ; Sputum [ ] ; Snoring [ ]   GI: Vomiting[ ] ; Dysphagia[ ] ; Melena[ ] ; Hematochezia [ ] ; Heartburn[ ] ; Abdominal pain [ ] ; Constipation [ ] ; Diarrhea [ ] ; BRBPR [ ]   GU: Hematuria[ ] ; Dysuria [ ] ; Nocturia[ ]   Vascular: Pain in legs with walking [ ] ; Pain in feet with lying flat [ ] ; Non-healing sores [ ] ; Stroke [ ] ; TIA [ ] ; Slurred speech [ ] ;  Neuro: Headaches[ ] ; Vertigo[ ] ; Seizures[ ] ; Paresthesias[ ] ;Blurred vision [ ] ; Diplopia [ ] ; Vision changes [ ]   Ortho/Skin: Arthritis Blue.Reese ]; Joint pain Blue.Reese ]; Muscle pain [ ] ; Joint swelling [ ] ; Back Pain [ ] ; Rash [ ]   Psych: Depression[y ]; Anxiety[ ]   Heme: Bleeding problems [ ] ; Clotting disorders [ ] ; Anemia [ ]   Endocrine: Diabetes Blue.Reese ]; Thyroid dysfunction[ ]    Past Medical History:  Diagnosis Date  . Anemia 01/02/2012  . Anemia associated with chronic renal failure   . Aortic stenosis   . Atrial fibrillation ([place])   . CAD (coronary artery disease)   . Chronic kidney disease    chronic renal failure  . CVA (cerebral infarction) 05/2010  . Diabetes mellitus   . Gout   . Hyperlipidemia   . Hypertension   . Myocardial infarction ([place])   . Obesity   . Shoulder pain, left    sees Dr.  Nickola Major at [facility] in [place]   . Splenomegaly 2012  . Thrombocytopenia ([place]) 2011   sees Dr. Lamonte Sakai  . Thrombocytopenia ([place])     Current Outpatient Prescriptions  Medication Sig Dispense Refill  . allopurinol (ZYLOPRIM) 100 MG tablet Take 300 mg by mouth daily.    Marland Kitchen ALPRAZolam (XANAX) 0.5 MG tablet Take 1 tablet (0.5 mg total) by mouth every 6 (six) hours as needed. (Patient taking differently: Take 0.5 mg by mouth every 6 (six) hours as needed for anxiety. ) 120 tablet 0  . amiodarone (PACERONE) 200 MG tablet Take 1 tablet (200 mg total) by mouth daily. 90 tablet 3  . apixaban (ELIQUIS) 2.5 MG TABS tablet Take 1 tablet (2.5 mg total) by mouth 2 (two) times daily. 180 tablet 0  . aspirin 81 MG tablet Take 81 mg by mouth daily.    . B-D ULTRAFINE III SHORT PEN 31G X 8 MM MISC USE AS DIRECTED TEST 3 TO 4 TIMES PER DAY 100 each 1  . calcium carbonate (TUMS - DOSED IN MG ELEMENTAL CALCIUM) 500 MG chewable tablet Chew 500-1,000 mg by mouth daily after lunch. 1-2 depends on if feeling more gassy will take 2    . carvedilol (COREG) 6.25 MG tablet Take 1 tablet (6.25 mg total) by mouth 2 (two) times daily. 60 tablet 3  . Docusate Sodium (COLACE PO) Take 1 capsule by mouth 2 (two) times daily.     . furosemide (LASIX) 80 MG tablet Take 80 mg by mouth 2 (two) times daily.    . insulin aspart (NOVOLOG FLEXPEN) 100 UNIT/ML FlexPen Inject 5-10 Units into the skin 2 (two) times daily with a meal. 15 mL 3  . Insulin Glargine (LANTUS SOLOSTAR) 100 UNIT/ML Solostar Pen INJECT 30 UNITS AT BEDTIME 15 mL 3  . Krill Oil 1000 MG CAPS Take 1,000 mg by mouth daily at 3 pm.     . levothyroxine (SYNTHROID, LEVOTHROID) 25 MCG tablet Take 25 mcg by mouth every evening.     . mycophenolate (MYFORTIC) 180 MG EC tablet Take 1 tablet (180 mg total) by mouth 2 (two) times daily. 2 tablet 0  . omeprazole (PRILOSEC) 20 MG capsule Take 20 mg by mouth at bedtime.     . ondansetron (ZOFRAN) 4 MG tablet Take  1 tablet (4 mg total) by mouth every 8 (eight) hours as needed for nausea or vomiting. 20 tablet 2  . ONE TOUCH ULTRA TEST test strip TEST 1-3 TIMES PER DAY AND DIAGNOSIS CODE IS E 11.9 100 each 1  . oxyCODONE (OXY IR/ROXICODONE) 5 MG immediate release tablet Take 1 tablet (5 mg total) by mouth every 6 (six) hours as needed for moderate pain. 120 tablet 0  . simvastatin (ZOCOR) 10 MG tablet Take 1 tablet (10 mg  total) by mouth at bedtime. (Patient taking differently: Take 10 mg by mouth daily at 3 pm. ) 1 tablet 0  . tacrolimus (PROGRAF) 0.5 MG capsule Take 0.5 mg by mouth 2 (two) times daily.    . temazepam (RESTORIL) 30 MG capsule TAKE ONE CAPSULE BY MOUTH AT BEDTIME AS NEEDED FILL 12-22-14 (Patient taking differently: TAKE ONE CAPSULE BY MOUTH AT BEDTIME AS NEEDED for sleep FILL 12-22-14) 90 capsule 1  . traMADol (ULTRAM) 50 MG tablet TAKE 2 TABLETS BY MOUTH EVERY 6 HOURS AS NEEDED (Patient taking differently: TAKE 2 TABLETS BY MOUTH EVERY 6 HOURS AS NEEDED for pain) 240 tablet 1  . amiodarone (PACERONE) 200 MG tablet TAKE 2 TABLETS TWICE DAILY X2 WEEKS, THEN REDUCE TO 2 TABLETS ONCE DAILY FOR 2 WEEK (Patient not taking: Reported on 04/25/2017) 84 tablet 0  . carvedilol (COREG) 12.5 MG tablet Take 6.25 mg by mouth 2 (two) times daily with a meal.    . diltiazem (CARDIZEM CD) 120 MG 24 hr capsule Take 1 capsule (120 mg total) by mouth daily. 90 capsule 3  . diltiazem (CARDIZEM CD) 120 MG 24 hr capsule Take 120 mg by mouth at bedtime.    . docusate sodium (COLACE) 100 MG capsule Take 100 mg by mouth at bedtime.    . polyethylene glycol powder (GLYCOLAX/MIRALAX) powder Take 17 g by mouth daily.  0  . sulfamethoxazole-trimethoprim (BACTRIM,SEPTRA) 400-80 MG tablet Take 1 tablet by mouth every Monday, Wednesday, and Friday. Takes for kidney rejection  5   No current facility-administered medications for this encounter.     Allergies  Allergen Reactions  . Penicillins Hives    Pt was 81 years old  Has  patient had a PCN reaction causing immediate rash, facial/tongue/throat swelling, SOB or lightheadedness with hypotension: Unknown Has patient had a PCN reaction causing severe rash involving mucus membranes or skin necrosis: Unknown Has patient had a PCN reaction that required hospitalization: No Has patient had a PCN reaction occurring within the last 10 years: No If all of the above answers are "NO", then may proceed with Cephalosporin use.       Social History   Social History  . Marital status: Widowed    Spouse name: N/A  . Number of children: N/A  . Years of education: N/A   Occupational History  . Not on file.   Social History Main Topics  . Smoking status: Never Smoker  . Smokeless tobacco: Never Used  . Alcohol use No  . Drug use: No  . Sexual activity: Not on file   Other Topics Concern  . Not on file   Social History Narrative  . No narrative on file      Family History  Problem Relation Age of Onset  . Liver disease Father   . Cancer Unknown        colon/fhx  . Hypertension Unknown        fhx  . Coronary artery disease Unknown        fhx    Vitals:   04/25/17 1132  BP: 124/60  Pulse: 65  SpO2: 96%  Weight: 225 lb (102.1 kg)  Height: 5\' 11"  (1.803 m)    PHYSICAL EXAM: General:  Fatigued appearing. No respiratory difficulty HEENT: normal Neck: supple. JVP to jaw  Carotids 2+ bilat; no bruits. No lymphadenopathy or thryomegaly appreciated. Cor: PMI nondisplaced. Irregular rate & rhythm. 2/6 SEM RUSB S2 crisp Lungs: clear Abdomen: obese soft, nontender, nondistended. No hepatosplenomegaly. No  bruits or masses. Good bowel sounds. Extremities: no cyanosis, clubbing, rash, 1+ edema. Large RUE AVF with thrill Neuro: alert & oriented x 3, cranial nerves grossly intact. moves all 4 extremities w/o difficulty. Affect pleasant.   ReDS vest = 45% lung water (normal 20-35%)  ASSESSMENT & PLAN:  1. Acute on chronic diastolic/RV heart failure --NYHA  III-IIIB symptoms --Symptoms and renal function have clearly deteriorated with development of AF so suspect component of cardiorenal syndrome. Unfortunately not sure how much of his renal function we will be able to recover. --Despite recent weight loss of 25 pounds he remains volume overloaded on exam and by ReDS Vest.  --Will plan RHC on Monday to assess R/L sided pressures as well as cardiac output. If low output will admit for short course of inotropic support to try and improve renal function. [place] numbers will be affected by presence of large AVF.  --With immunosuppression will not be good candidate for chronic PICC and home inotropes --Plan TEE and repeat DC-CV later in week   2. Paroxysmal AF --CHADSVASc = 5 --As above, this has clearly led to worsening of symptoms and renal function --Failed DC-CV in 2/18. Now on amio,. Will repeat DC-CV next week --Eliquis dose dose reduce due to RP bleed byt almost meets criteria for dose reduction anyway with age and CKD. For safety's sake will proceed with TEE prior to DC-CV  3. CAD --stable. No s/s of ischemia   4. AVR --stable on exam and by recent echo  5. ESRD s/p renal transplant in 2013 --followed by Dr. Lorrene Reid --above plan discussed with her at length,.   6. Possible OSA --will need sleep study  Glori Bickers, MD  9:59 PM

## 2017-04-28 NOTE — Discharge Instructions (Signed)
Remove transparent dressing to right neck after 24 hours.  Notify provider if you have any questions or concerns.  Notify provider if there is any bleeding or drainage from site or if you develop fevers or chills.   Per Cephus Shelling, PA for Dr. Haroldine Laws, patient can resume home medications tomorrow including Aspirin and Eliquis.

## 2017-04-28 NOTE — Interval H&P Note (Signed)
History and Physical Interval Note:  04/28/2017 9:04 AM  [name] Brown  has presented today for surgery, with the diagnosis of heart failure  The various methods of treatment have been discussed with the patient and family. After consideration of risks, benefits and other options for treatment, the patient has consented to  Procedure(s): Right Heart Cath (N/A) as a surgical intervention .  The patient's history has been reviewed, patient examined, no change in status, stable for surgery.  I have reviewed the patient's chart and labs.  Questions were answered to the patient's satisfaction.     Nikeria Kalman, Quillian Quince

## 2017-04-29 ENCOUNTER — Encounter (HOSPITAL_COMMUNITY): Payer: Self-pay | Admitting: Internal Medicine

## 2017-05-01 ENCOUNTER — Encounter (HOSPITAL_COMMUNITY): Payer: Self-pay

## 2017-05-01 ENCOUNTER — Ambulatory Visit (HOSPITAL_COMMUNITY)
Admission: RE | Admit: 2017-05-01 | Discharge: 2017-05-01 | Disposition: A | Discharge status: Home / Self Care | Payer: Medicare Other | Source: Ambulatory Visit | Attending: Internal Medicine | Admitting: Internal Medicine

## 2017-05-01 ENCOUNTER — Ambulatory Visit (HOSPITAL_COMMUNITY): Payer: Medicare Other | Admitting: Anesthesiology

## 2017-05-01 ENCOUNTER — Encounter (HOSPITAL_COMMUNITY)
Admission: RE | Disposition: A | Discharge status: Home / Self Care | Payer: Self-pay | Source: Ambulatory Visit | Attending: Internal Medicine

## 2017-05-01 ENCOUNTER — Ambulatory Visit (HOSPITAL_BASED_OUTPATIENT_CLINIC_OR_DEPARTMENT_OTHER): Payer: Medicare Other

## 2017-05-01 DIAGNOSIS — Z940 Kidney transplant status: Secondary | ICD-10-CM | POA: Diagnosis not present

## 2017-05-01 DIAGNOSIS — E669 Obesity, unspecified: Secondary | ICD-10-CM | POA: Insufficient documentation

## 2017-05-01 DIAGNOSIS — Q211 Atrial septal defect: Secondary | ICD-10-CM | POA: Diagnosis not present

## 2017-05-01 DIAGNOSIS — Z880 Allergy status to penicillin: Secondary | ICD-10-CM | POA: Insufficient documentation

## 2017-05-01 DIAGNOSIS — I5033 Acute on chronic diastolic (congestive) heart failure: Secondary | ICD-10-CM | POA: Insufficient documentation

## 2017-05-01 DIAGNOSIS — I480 Paroxysmal atrial fibrillation: Secondary | ICD-10-CM | POA: Diagnosis not present

## 2017-05-01 DIAGNOSIS — Z8673 Personal history of transient ischemic attack (TIA), and cerebral infarction without residual deficits: Secondary | ICD-10-CM | POA: Insufficient documentation

## 2017-05-01 DIAGNOSIS — I252 Old myocardial infarction: Secondary | ICD-10-CM | POA: Insufficient documentation

## 2017-05-01 DIAGNOSIS — Z7982 Long term (current) use of aspirin: Secondary | ICD-10-CM | POA: Diagnosis not present

## 2017-05-01 DIAGNOSIS — Z794 Long term (current) use of insulin: Secondary | ICD-10-CM | POA: Insufficient documentation

## 2017-05-01 DIAGNOSIS — I132 Hypertensive heart and chronic kidney disease with heart failure and with stage 5 chronic kidney disease, or end stage renal disease: Secondary | ICD-10-CM | POA: Diagnosis not present

## 2017-05-01 DIAGNOSIS — Z953 Presence of xenogenic heart valve: Secondary | ICD-10-CM | POA: Diagnosis not present

## 2017-05-01 DIAGNOSIS — E1122 Type 2 diabetes mellitus with diabetic chronic kidney disease: Secondary | ICD-10-CM | POA: Diagnosis not present

## 2017-05-01 DIAGNOSIS — I340 Nonrheumatic mitral (valve) insufficiency: Secondary | ICD-10-CM | POA: Diagnosis not present

## 2017-05-01 DIAGNOSIS — Z951 Presence of aortocoronary bypass graft: Secondary | ICD-10-CM | POA: Diagnosis not present

## 2017-05-01 DIAGNOSIS — M109 Gout, unspecified: Secondary | ICD-10-CM | POA: Insufficient documentation

## 2017-05-01 DIAGNOSIS — I4891 Unspecified atrial fibrillation: Secondary | ICD-10-CM

## 2017-05-01 DIAGNOSIS — N186 End stage renal disease: Secondary | ICD-10-CM | POA: Diagnosis not present

## 2017-05-01 DIAGNOSIS — Z7901 Long term (current) use of anticoagulants: Secondary | ICD-10-CM | POA: Diagnosis not present

## 2017-05-01 DIAGNOSIS — I110 Hypertensive heart disease with heart failure: Secondary | ICD-10-CM | POA: Diagnosis not present

## 2017-05-01 DIAGNOSIS — I2510 Atherosclerotic heart disease of native coronary artery without angina pectoris: Secondary | ICD-10-CM | POA: Diagnosis not present

## 2017-05-01 DIAGNOSIS — I272 Pulmonary hypertension, unspecified: Secondary | ICD-10-CM | POA: Insufficient documentation

## 2017-05-01 DIAGNOSIS — E785 Hyperlipidemia, unspecified: Secondary | ICD-10-CM | POA: Diagnosis not present

## 2017-05-01 DIAGNOSIS — I361 Nonrheumatic tricuspid (valve) insufficiency: Secondary | ICD-10-CM | POA: Diagnosis not present

## 2017-05-01 DIAGNOSIS — D631 Anemia in chronic kidney disease: Secondary | ICD-10-CM | POA: Insufficient documentation

## 2017-05-01 DIAGNOSIS — Z6831 Body mass index (BMI) 31.0-31.9, adult: Secondary | ICD-10-CM | POA: Diagnosis not present

## 2017-05-01 HISTORY — PX: CARDIOVERSION: SHX1299

## 2017-05-01 HISTORY — PX: TEE WITHOUT CARDIOVERSION: SHX5443

## 2017-05-01 LAB — POCT I-STAT, CHEM 8
BUN: 45 mg/dL — ABNORMAL HIGH (ref 6–20)
Calcium, Ion: 1.07 mmol/L — ABNORMAL LOW (ref 1.15–1.40)
Chloride: 102 mmol/L (ref 101–111)
Creatinine, Ser: 2.9 mg/dL — ABNORMAL HIGH (ref 0.61–1.24)
Glucose, Bld: 123 mg/dL — ABNORMAL HIGH (ref 65–99)
HEMATOCRIT: 31 % — AB (ref 39.0–52.0)
HEMOGLOBIN: 10.5 g/dL — AB (ref 13.0–17.0)
POTASSIUM: 4.2 mmol/L (ref 3.5–5.1)
SODIUM: 141 mmol/L (ref 135–145)
TCO2: 26 mmol/L (ref 0–100)

## 2017-05-01 LAB — POCT I-STAT 4, (NA,K, GLUC, HGB,HCT)
GLUCOSE: 122 mg/dL — AB (ref 65–99)
HEMATOCRIT: 30 % — AB (ref 39.0–52.0)
Hemoglobin: 10.2 g/dL — ABNORMAL LOW (ref 13.0–17.0)
Potassium: 4.1 mmol/L (ref 3.5–5.1)
Sodium: 140 mmol/L (ref 135–145)

## 2017-05-01 LAB — GLUCOSE, CAPILLARY: Glucose-Capillary: 112 mg/dL — ABNORMAL HIGH (ref 65–99)

## 2017-05-01 SURGERY — ECHOCARDIOGRAM, TRANSESOPHAGEAL
Anesthesia: Monitor Anesthesia Care

## 2017-05-01 MED ORDER — SODIUM CHLORIDE 0.9 % IV SOLN: 250.0000 mL | INTRAVENOUS | Status: DC

## 2017-05-01 MED ORDER — BUTAMBEN-TETRACAINE-BENZOCAINE 2-2-14 % EX AERO
INHALATION_SPRAY | CUTANEOUS | Status: DC | PRN
  Administered 2017-05-01: 2 via TOPICAL

## 2017-05-01 MED ORDER — SODIUM CHLORIDE 0.9% FLUSH: 3.0000 mL | INTRAVENOUS | Status: DC | PRN

## 2017-05-01 MED ORDER — PHENYLEPHRINE HCL 10 MG/ML IJ SOLN
INTRAMUSCULAR | Status: DC | PRN
  Administered 2017-05-01: 80 ug via INTRAVENOUS

## 2017-05-01 MED ORDER — SODIUM CHLORIDE 0.9% FLUSH: 3.0000 mL | Freq: Two times a day (BID) | INTRAVENOUS | Status: DC

## 2017-05-01 MED ORDER — PROPOFOL 500 MG/50ML IV EMUL
INTRAVENOUS | Status: DC | PRN
  Administered 2017-05-01: 100 ug/kg/min via INTRAVENOUS

## 2017-05-01 MED ORDER — SODIUM CHLORIDE 0.9 % IV SOLN: INTRAVENOUS | Status: DC

## 2017-05-01 MED ORDER — HYDROCORTISONE 1 % EX CREA: 1.0000 "application " | TOPICAL_CREAM | Freq: Three times a day (TID) | CUTANEOUS | Status: DC | PRN

## 2017-05-01 MED ORDER — SODIUM CHLORIDE 0.9 % IV SOLN
INTRAVENOUS | Status: DC
Start: 2017-05-01 — End: 2017-05-01

## 2017-05-01 MED ORDER — LIDOCAINE HCL (CARDIAC) 20 MG/ML IV SOLN
INTRAVENOUS | Status: DC | PRN
  Administered 2017-05-01: 60 mg via INTRATRACHEAL

## 2017-05-01 MED ORDER — SODIUM CHLORIDE 0.9 % IV SOLN
INTRAVENOUS | Status: DC | PRN
  Administered 2017-05-01: 14:00:00 via INTRAVENOUS

## 2017-05-01 NOTE — Discharge Instructions (Signed)
Electrical Cardioversion, Care After °This sheet gives you information about how to care for yourself after your procedure. Your health care provider may also give you more specific instructions. If you have problems or questions, contact your health care provider. °What can I expect after the procedure? °After the procedure, it is common to have: °· Some redness on the skin where the shocks were given. ° °Follow these instructions at home: °· Do not drive for 24 hours if you were given a medicine to help you relax (sedative). °· Take over-the-counter and prescription medicines only as told by your health care provider. °· Ask your health care provider how to check your pulse. Check it often. °· Rest for 48 hours after the procedure or as told by your health care provider. °· Avoid or limit your caffeine use as told by your health care provider. °Contact a health care provider if: °· You feel like your heart is beating too quickly or your pulse is not regular. °· You have a serious muscle cramp that does not go away. °Get help right away if: °· You have discomfort in your chest. °· You are dizzy or you feel faint. °· You have trouble breathing or you are short of breath. °· Your speech is slurred. °· You have trouble moving an arm or leg on one side of your body. °· Your fingers or toes turn cold or blue. °This information is not intended to replace advice given to you by your health care provider. Make sure you discuss any questions you have with your health care provider. °Document Released: 07/28/2013 Document Revised: 05/10/2016 Document Reviewed: 04/12/2016 °Elsevier Interactive Patient Education © 2018 [employer] ° °

## 2017-05-01 NOTE — Anesthesia Postprocedure Evaluation (Signed)
Anesthesia Post Note  Patient: [name] Brown  Procedure(s) Performed: Procedure(s) (LRB): TRANSESOPHAGEAL ECHOCARDIOGRAM (TEE) (N/A) CARDIOVERSION (N/A)     Patient location during evaluation: PACU Anesthesia Type: General Level of consciousness: awake and alert Pain management: pain level controlled Vital Signs Assessment: post-procedure vital signs reviewed and stable Respiratory status: spontaneous breathing, nonlabored ventilation, respiratory function stable and patient connected to nasal cannula oxygen Cardiovascular status: stable and blood pressure returned to baseline Anesthetic complications: no    Last Vitals:  Vitals:   05/01/17 1455 05/01/17 1505  BP: (!) 119/48 140/61  Pulse: 61 60  Resp: 16 18  Temp:      Last Pain:  Vitals:   05/01/17 1440  TempSrc: Oral                 Davin Archuletta DAVID

## 2017-05-01 NOTE — CV Procedure (Signed)
   TRANSESOPHAGEAL ECHOCARDIOGRAM GUIDED DIRECT CURRENT CARDIOVERSION  NAME:  [name] Brown   MRN: [id number] DOB:  September 21, 1936   ADMIT DATE: 05/01/2017  INDICATIONS: Atrial fibrillation   PROCEDURE:   Informed consent was obtained prior to the procedure. The risks, benefits and alternatives for the procedure were discussed and the patient comprehended these risks.  Risks include, but are not limited to, cough, sore throat, vomiting, nausea, somnolence, esophageal and stomach trauma or perforation, bleeding, low blood pressure, aspiration, pneumonia, infection, trauma to the teeth and death.    After a procedural time-out, the oropharynx was anesthetized and the patient was sedated by the anesthesia service. The transesophageal probe was inserted in the esophagus and stomach without difficulty and multiple views were obtained.   FINDINGS:  LEFT VENTRICLE: EF = 50%  RIGHT VENTRICLE: Mild HK   LEFT ATRIUM: Markedly dilated diameter 6.2 cm  LEFT ATRIAL APPENDAGE: No clot  RIGHT ATRIUM: Markedly dilated. +eustachian valve  AORTIC VALVE:  Bioprosthetic aortic valve. Well functioning. No AS/AI  MITRAL VALVE:    Moderately thickened.  Mild MR  TRICUSPID VALVE: Dilated annulus. Severe TR  PULMONIC VALVE: Normal No PR  INTERATRIAL SEPTUM: Small PFO  PERICARDIUM: No effusion  DESCENDING AORTA: Moderate plaque    CARDIOVERSION:     Indications:  Atrial Fibrillation  Procedure Details:  Once the TEE was complete, the patient had the defibrillator pads placed in the anterior and posterior position. Once an appropriate level of sedation was achieved, the patient received a single biphasic, synchronized 200J shock with prompt conversion to sinus rhythm. No apparent complications.   Glori Bickers, MD  2:32 PM

## 2017-05-01 NOTE — Addendum Note (Signed)
Addendum  created 05/01/17 1857 by Lillia Abed, MD   Sign clinical note, SmartForm saved

## 2017-05-01 NOTE — Anesthesia Preprocedure Evaluation (Addendum)
Anesthesia Evaluation  Patient identified by MRN, date of birth, ID band Patient awake    Reviewed: Allergy & Precautions, H&P , NPO status , Patient's Chart, lab work & pertinent test results, reviewed documented beta blocker date and time   Airway Mallampati: III  TM Distance: >3 FB Neck ROM: Full    Dental no notable dental hx. (+) Poor Dentition, Dental Advisory Given   Pulmonary neg pulmonary ROS,    Pulmonary exam normal breath sounds clear to auscultation       Cardiovascular hypertension, Pt. on medications and Pt. on home beta blockers + CAD, + Past MI, + CABG and +CHF  Normal cardiovascular exam+ dysrhythmias Atrial Fibrillation + Valvular Problems/Murmurs  Rhythm:Irregular Rate:Normal     Neuro/Psych Depression negative neurological ROS     GI/Hepatic negative GI ROS, Neg liver ROS,   Endo/Other  diabetes, Insulin Dependent  Renal/GU Renal disease  negative genitourinary   Musculoskeletal  (+) Arthritis , Osteoarthritis,    Abdominal   Peds  Hematology negative hematology ROS (+) anemia ,   Anesthesia Other Findings   Reproductive/Obstetrics negative OB ROS                           Anesthesia Physical Anesthesia Plan  ASA: III  Anesthesia Plan: General   Post-op Pain Management:    Induction: Intravenous  PONV Risk Score and Plan: 2 and Propofol and Treatment may vary due to age or medical condition  Airway Management Planned: Nasal Cannula  Additional Equipment:   Intra-op Plan:   Post-operative Plan:   Informed Consent: I have reviewed the patients History and Physical, chart, labs and discussed the procedure including the risks, benefits and alternatives for the proposed anesthesia with the patient or authorized representative who has indicated his/her understanding and acceptance.   Dental advisory given  Plan Discussed with: CRNA and Surgeon  Anesthesia  Plan Comments:        Anesthesia Quick Evaluation

## 2017-05-01 NOTE — Transfer of Care (Signed)
Immediate Anesthesia Transfer of Care Note  Patient: [name] Brown  Procedure(s) Performed: Procedure(s): TRANSESOPHAGEAL ECHOCARDIOGRAM (TEE) (N/A) CARDIOVERSION (N/A)  Patient Location: Endoscopy Unit  Anesthesia Type:MAC  Level of Consciousness: awake, alert , oriented and sedated  Airway & Oxygen Therapy: Patient Spontanous Breathing and Patient connected to nasal cannula oxygen  Post-op Assessment: Report given to RN, Post -op Vital signs reviewed and stable and Patient moving all extremities X 4  Post vital signs: Reviewed and stable  Last Vitals:  Vitals:   05/01/17 1020  BP: (!) 132/57  Pulse: 61  Resp: 14  Temp: 36.9 C    Last Pain:  Vitals:   05/01/17 1020  TempSrc: Oral         Complications: No apparent anesthesia complications

## 2017-05-01 NOTE — Anesthesia Postprocedure Evaluation (Signed)
Anesthesia Post Note  Patient: Dominque Marlin Decelle  Procedure(s) Performed: Procedure(s) (LRB): TRANSESOPHAGEAL ECHOCARDIOGRAM (TEE) (N/A) CARDIOVERSION (N/A)     Patient location during evaluation: PACU Anesthesia Type: MAC Level of consciousness: awake and alert Pain management: pain level controlled Vital Signs Assessment: post-procedure vital signs reviewed and stable Respiratory status: spontaneous breathing, nonlabored ventilation, respiratory function stable and patient connected to nasal cannula oxygen Cardiovascular status: stable and blood pressure returned to baseline Anesthetic complications: no    Last Vitals:  Vitals:   05/01/17 1455 05/01/17 1505  BP: (!) 119/48 140/61  Pulse: 61 60  Resp: 16 18  Temp:      Last Pain:  Vitals:   05/01/17 1440  TempSrc: Oral                 Chinyere Galiano DAVID

## 2017-05-02 DIAGNOSIS — Z6831 Body mass index (BMI) 31.0-31.9, adult: Secondary | ICD-10-CM | POA: Diagnosis not present

## 2017-05-02 DIAGNOSIS — E119 Type 2 diabetes mellitus without complications: Secondary | ICD-10-CM | POA: Diagnosis not present

## 2017-05-02 DIAGNOSIS — S301XXA Contusion of abdominal wall, initial encounter: Secondary | ICD-10-CM | POA: Diagnosis not present

## 2017-05-02 DIAGNOSIS — Z940 Kidney transplant status: Secondary | ICD-10-CM | POA: Diagnosis not present

## 2017-05-02 DIAGNOSIS — D631 Anemia in chronic kidney disease: Secondary | ICD-10-CM | POA: Diagnosis not present

## 2017-05-02 DIAGNOSIS — I481 Persistent atrial fibrillation: Secondary | ICD-10-CM | POA: Diagnosis not present

## 2017-05-02 DIAGNOSIS — I5040 Unspecified combined systolic (congestive) and diastolic (congestive) heart failure: Secondary | ICD-10-CM | POA: Diagnosis not present

## 2017-05-02 NOTE — Interval H&P Note (Signed)
History and Physical Interval Note:  05/02/2017 5:14 PM  [name] Brown  has presented today for surgery, with the diagnosis of AFIB  The various methods of treatment have been discussed with the patient and family. After consideration of risks, benefits and other options for treatment, the patient has consented to  Procedure(s): TRANSESOPHAGEAL ECHOCARDIOGRAM (TEE) (N/A) CARDIOVERSION (N/A) as a surgical intervention .  The patient's history has been reviewed, patient examined, no change in status, stable for surgery.  I have reviewed the patient's chart and labs.  Questions were answered to the patient's satisfaction.     Bensimhon, Quillian Quince

## 2017-05-02 NOTE — H&P (View-Only) (Signed)
ADVANCED HF CLINIC CONSULT NOTE  Referring Physician: Dr. Lorrene Reid   HPI:  [name] Brown is a 81 y.o. male with diabetes, HTN, ESRD (secondary to DM2 & ATN after CABG /bioprosthetic aortic valve replacement in 2011), CAD and PAF. He is s/p kidney transplant on 02/17/2012. He is referred by Dr. Lorrene Reid for worsening HF>   As above, he underwent bioprosthetic AVR/CABG in 2011. Complicated by progressive renal failure and was placed on HD. Was on HD for 2 years via RUE AVF. Underwent kidney transplant in 4/13 at [facility] and did well.   Unfortunately, developed AF in 2/18 and creatinine jumped from 1.5->2.0. Underwent DC-CV but did not hold. Saw Dr. Curt Bears and was placed on amiodarone with plans for repeat DC-CV in May.   However at the end of May was admitted [facility] for diastiolic HF with question of cardiorenal syndrome. Creatine peaked close to 4.. Echo during that admit showed normal EF with Pulmonary HTN and RV dysfunction. During that admit also developed RP bleed and Eliquis dose subsequently reduced.  Weight on admit to [facility] 251 pounds. Weight on d/c was 238.4 and felt to be still overloaded. still with weight on board. Has been followed closely by Dr. Josephine Igo since that time. On lasix 80 bid lost 6 pounds  Weight actually 225 now but still doesn't feel right. Creatinine 3.2  Tells me that he is SOB with very mild activity. No orthopnea or PND. But struggles with ADLs. Hard to get to the mailbox. Mild ankle edema. Lives alone. Doesn't know if he snores. Very compliant with fluid restrict. SBP 120s. Is adamant that he doesn't want to restart HD. Remains in AF.   Studies: Echo 03/20/17 Left ventricular systolic function is normal.  LV ejection fraction = 55-60%.  The right ventricle is moderately dilated.  The right ventricular systolic function is mildly reduced.  The left atrium is mildly dilated.  The right atrium is mildly dilated.  There is a bioprosthetic aortic valve.  There is  moderate to severe mitral annular calcification.  There is moderate tricuspid regurgitation.  Estimated right ventricular systolic pressure is 75 mmHg.  Estimated right atrial pressure is 10 mmHg..  Moderate to severe pulmonary hypertension.  There is no pericardial effusion.  Probably no significant change in comparison with the prior study noted    ECG: AF 66 LBBB  Review of Systems: [y] = yes, [ ]  = no   General: Weight gain [ ] ; Weight loss Blue.Reese ]; Anorexia [ ] ; Fatigue Blue.Reese ]; Fever [ ] ; Chills [ ] ; Weakness [ ]   Cardiac: Chest pain/pressure [ ] ; Resting SOB [ ] ; Exertional SOB [ y]; Orthopnea [ ] ; Pedal Edema Blue.Reese ]; Palpitations [ ] ; Syncope [ ] ; Presyncope [ ] ; Paroxysmal nocturnal dyspnea[ ]   Pulmonary: Cough [ ] ; Wheezing[ ] ; Hemoptysis[ ] ; Sputum [ ] ; Snoring [ ]   GI: Vomiting[ ] ; Dysphagia[ ] ; Melena[ ] ; Hematochezia [ ] ; Heartburn[ ] ; Abdominal pain [ ] ; Constipation [ ] ; Diarrhea [ ] ; BRBPR [ ]   GU: Hematuria[ ] ; Dysuria [ ] ; Nocturia[ ]   Vascular: Pain in legs with walking [ ] ; Pain in feet with lying flat [ ] ; Non-healing sores [ ] ; Stroke [ ] ; TIA [ ] ; Slurred speech [ ] ;  Neuro: Headaches[ ] ; Vertigo[ ] ; Seizures[ ] ; Paresthesias[ ] ;Blurred vision [ ] ; Diplopia [ ] ; Vision changes [ ]   Ortho/Skin: Arthritis Blue.Reese ]; Joint pain Blue.Reese ]; Muscle pain [ ] ; Joint swelling [ ] ; Back Pain [ ] ; Rash [ ]   Psych: Depression[y ]; Anxiety[ ]   Heme: Bleeding problems [ ] ; Clotting disorders [ ] ; Anemia [ ]   Endocrine: Diabetes Blue.Reese ]; Thyroid dysfunction[ ]    Past Medical History:  Diagnosis Date  . Anemia 01/02/2012  . Anemia associated with chronic renal failure   . Aortic stenosis   . Atrial fibrillation ([place])   . CAD (coronary artery disease)   . Chronic kidney disease    chronic renal failure  . CVA (cerebral infarction) 05/2010  . Diabetes mellitus   . Gout   . Hyperlipidemia   . Hypertension   . Myocardial infarction ([place])   . Obesity   . Shoulder pain, left    sees Dr.  Nickola Major at [facility] in [place]   . Splenomegaly 2012  . Thrombocytopenia ([place]) 2011   sees Dr. Lamonte Sakai  . Thrombocytopenia ([place])     Current Outpatient Prescriptions  Medication Sig Dispense Refill  . allopurinol (ZYLOPRIM) 100 MG tablet Take 300 mg by mouth daily.    Marland Kitchen ALPRAZolam (XANAX) 0.5 MG tablet Take 1 tablet (0.5 mg total) by mouth every 6 (six) hours as needed. (Patient taking differently: Take 0.5 mg by mouth every 6 (six) hours as needed for anxiety. ) 120 tablet 0  . amiodarone (PACERONE) 200 MG tablet Take 1 tablet (200 mg total) by mouth daily. 90 tablet 3  . apixaban (ELIQUIS) 2.5 MG TABS tablet Take 1 tablet (2.5 mg total) by mouth 2 (two) times daily. 180 tablet 0  . aspirin 81 MG tablet Take 81 mg by mouth daily.    . B-D ULTRAFINE III SHORT PEN 31G X 8 MM MISC USE AS DIRECTED TEST 3 TO 4 TIMES PER DAY 100 each 1  . calcium carbonate (TUMS - DOSED IN MG ELEMENTAL CALCIUM) 500 MG chewable tablet Chew 500-1,000 mg by mouth daily after lunch. 1-2 depends on if feeling more gassy will take 2    . carvedilol (COREG) 6.25 MG tablet Take 1 tablet (6.25 mg total) by mouth 2 (two) times daily. 60 tablet 3  . Docusate Sodium (COLACE PO) Take 1 capsule by mouth 2 (two) times daily.     . furosemide (LASIX) 80 MG tablet Take 80 mg by mouth 2 (two) times daily.    . insulin aspart (NOVOLOG FLEXPEN) 100 UNIT/ML FlexPen Inject 5-10 Units into the skin 2 (two) times daily with a meal. 15 mL 3  . Insulin Glargine (LANTUS SOLOSTAR) 100 UNIT/ML Solostar Pen INJECT 30 UNITS AT BEDTIME 15 mL 3  . Krill Oil 1000 MG CAPS Take 1,000 mg by mouth daily at 3 pm.     . levothyroxine (SYNTHROID, LEVOTHROID) 25 MCG tablet Take 25 mcg by mouth every evening.     . mycophenolate (MYFORTIC) 180 MG EC tablet Take 1 tablet (180 mg total) by mouth 2 (two) times daily. 2 tablet 0  . omeprazole (PRILOSEC) 20 MG capsule Take 20 mg by mouth at bedtime.     . ondansetron (ZOFRAN) 4 MG tablet Take  1 tablet (4 mg total) by mouth every 8 (eight) hours as needed for nausea or vomiting. 20 tablet 2  . ONE TOUCH ULTRA TEST test strip TEST 1-3 TIMES PER DAY AND DIAGNOSIS CODE IS E 11.9 100 each 1  . oxyCODONE (OXY IR/ROXICODONE) 5 MG immediate release tablet Take 1 tablet (5 mg total) by mouth every 6 (six) hours as needed for moderate pain. 120 tablet 0  . simvastatin (ZOCOR) 10 MG tablet Take 1 tablet (10 mg  total) by mouth at bedtime. (Patient taking differently: Take 10 mg by mouth daily at 3 pm. ) 1 tablet 0  . tacrolimus (PROGRAF) 0.5 MG capsule Take 0.5 mg by mouth 2 (two) times daily.    . temazepam (RESTORIL) 30 MG capsule TAKE ONE CAPSULE BY MOUTH AT BEDTIME AS NEEDED FILL 12-22-14 (Patient taking differently: TAKE ONE CAPSULE BY MOUTH AT BEDTIME AS NEEDED for sleep FILL 12-22-14) 90 capsule 1  . traMADol (ULTRAM) 50 MG tablet TAKE 2 TABLETS BY MOUTH EVERY 6 HOURS AS NEEDED (Patient taking differently: TAKE 2 TABLETS BY MOUTH EVERY 6 HOURS AS NEEDED for pain) 240 tablet 1  . amiodarone (PACERONE) 200 MG tablet TAKE 2 TABLETS TWICE DAILY X2 WEEKS, THEN REDUCE TO 2 TABLETS ONCE DAILY FOR 2 WEEK (Patient not taking: Reported on 04/25/2017) 84 tablet 0  . carvedilol (COREG) 12.5 MG tablet Take 6.25 mg by mouth 2 (two) times daily with a meal.    . diltiazem (CARDIZEM CD) 120 MG 24 hr capsule Take 1 capsule (120 mg total) by mouth daily. 90 capsule 3  . diltiazem (CARDIZEM CD) 120 MG 24 hr capsule Take 120 mg by mouth at bedtime.    . docusate sodium (COLACE) 100 MG capsule Take 100 mg by mouth at bedtime.    . polyethylene glycol powder (GLYCOLAX/MIRALAX) powder Take 17 g by mouth daily.  0  . sulfamethoxazole-trimethoprim (BACTRIM,SEPTRA) 400-80 MG tablet Take 1 tablet by mouth every Monday, Wednesday, and Friday. Takes for kidney rejection  5   No current facility-administered medications for this encounter.     Allergies  Allergen Reactions  . Penicillins Hives    Pt was 81 years old  Has  patient had a PCN reaction causing immediate rash, facial/tongue/throat swelling, SOB or lightheadedness with hypotension: Unknown Has patient had a PCN reaction causing severe rash involving mucus membranes or skin necrosis: Unknown Has patient had a PCN reaction that required hospitalization: No Has patient had a PCN reaction occurring within the last 10 years: No If all of the above answers are "NO", then may proceed with Cephalosporin use.       Social History   Social History  . Marital status: Widowed    Spouse name: N/A  . Number of children: N/A  . Years of education: N/A   Occupational History  . Not on file.   Social History Main Topics  . Smoking status: Never Smoker  . Smokeless tobacco: Never Used  . Alcohol use No  . Drug use: No  . Sexual activity: Not on file   Other Topics Concern  . Not on file   Social History Narrative  . No narrative on file      Family History  Problem Relation Age of Onset  . Liver disease Father   . Cancer Unknown        colon/fhx  . Hypertension Unknown        fhx  . Coronary artery disease Unknown        fhx    Vitals:   04/25/17 1132  BP: 124/60  Pulse: 65  SpO2: 96%  Weight: 225 lb (102.1 kg)  Height: 5\' 11"  (1.803 m)    PHYSICAL EXAM: General:  Fatigued appearing. No respiratory difficulty HEENT: normal Neck: supple. JVP to jaw  Carotids 2+ bilat; no bruits. No lymphadenopathy or thryomegaly appreciated. Cor: PMI nondisplaced. Irregular rate & rhythm. 2/6 SEM RUSB S2 crisp Lungs: clear Abdomen: obese soft, nontender, nondistended. No hepatosplenomegaly. No  bruits or masses. Good bowel sounds. Extremities: no cyanosis, clubbing, rash, 1+ edema. Large RUE AVF with thrill Neuro: alert & oriented x 3, cranial nerves grossly intact. moves all 4 extremities w/o difficulty. Affect pleasant.   ReDS vest = 45% lung water (normal 20-35%)  ASSESSMENT & PLAN:  1. Acute on chronic diastolic/RV heart failure --NYHA  III-IIIB symptoms --Symptoms and renal function have clearly deteriorated with development of AF so suspect component of cardiorenal syndrome. Unfortunately not sure how much of his renal function we will be able to recover. --Despite recent weight loss of 25 pounds he remains volume overloaded on exam and by ReDS Vest.  --Will plan RHC on Monday to assess R/L sided pressures as well as cardiac output. If low output will admit for short course of inotropic support to try and improve renal function. [place] numbers will be affected by presence of large AVF.  --With immunosuppression will not be good candidate for chronic PICC and home inotropes --Plan TEE and repeat DC-CV later in week   2. Paroxysmal AF --CHADSVASc = 5 --As above, this has clearly led to worsening of symptoms and renal function --Failed DC-CV in 2/18. Now on amio,. Will repeat DC-CV next week --Eliquis dose dose reduce due to RP bleed byt almost meets criteria for dose reduction anyway with age and CKD. For safety's sake will proceed with TEE prior to DC-CV  3. CAD --stable. No s/s of ischemia   4. AVR --stable on exam and by recent echo  5. ESRD s/p renal transplant in 2013 --followed by Dr. Lorrene Reid --above plan discussed with her at length,.   6. Possible OSA --will need sleep study  Glori Bickers, MD  9:59 PM

## 2017-05-05 ENCOUNTER — Encounter: Payer: Self-pay | Admitting: *Deleted

## 2017-05-06 ENCOUNTER — Other Ambulatory Visit: Payer: Self-pay | Admitting: Family Medicine

## 2017-05-08 ENCOUNTER — Other Ambulatory Visit (HOSPITAL_COMMUNITY): Payer: Self-pay | Admitting: *Deleted

## 2017-05-09 ENCOUNTER — Ambulatory Visit (HOSPITAL_COMMUNITY)
Admission: RE | Admit: 2017-05-09 | Discharge: 2017-05-09 | Disposition: A | Discharge status: Home / Self Care | Payer: Medicare Other | Source: Ambulatory Visit | Attending: Nephrology | Admitting: Nephrology

## 2017-05-09 DIAGNOSIS — I481 Persistent atrial fibrillation: Secondary | ICD-10-CM | POA: Diagnosis not present

## 2017-05-09 DIAGNOSIS — Z940 Kidney transplant status: Secondary | ICD-10-CM | POA: Insufficient documentation

## 2017-05-09 DIAGNOSIS — D631 Anemia in chronic kidney disease: Secondary | ICD-10-CM | POA: Insufficient documentation

## 2017-05-09 DIAGNOSIS — N189 Chronic kidney disease, unspecified: Secondary | ICD-10-CM | POA: Diagnosis not present

## 2017-05-09 DIAGNOSIS — E119 Type 2 diabetes mellitus without complications: Secondary | ICD-10-CM | POA: Diagnosis not present

## 2017-05-09 DIAGNOSIS — I5040 Unspecified combined systolic (congestive) and diastolic (congestive) heart failure: Secondary | ICD-10-CM | POA: Diagnosis not present

## 2017-05-09 DIAGNOSIS — S301XXA Contusion of abdominal wall, initial encounter: Secondary | ICD-10-CM | POA: Diagnosis not present

## 2017-05-09 LAB — URINALYSIS, COMPLETE (UACMP) WITH MICROSCOPIC
BACTERIA UA: NONE SEEN
BILIRUBIN URINE: NEGATIVE
Glucose, UA: NEGATIVE mg/dL
HGB URINE DIPSTICK: NEGATIVE
KETONES UR: NEGATIVE mg/dL
Leukocytes, UA: NEGATIVE
NITRITE: NEGATIVE
Protein, ur: NEGATIVE mg/dL
Specific Gravity, Urine: 1.009 (ref 1.005–1.030)
Squamous Epithelial / LPF: NONE SEEN
pH: 6 (ref 5.0–8.0)

## 2017-05-09 LAB — COMPREHENSIVE METABOLIC PANEL
ALK PHOS: 99 U/L (ref 38–126)
ALT: 14 U/L — ABNORMAL LOW (ref 17–63)
ANION GAP: 11 (ref 5–15)
AST: 23 U/L (ref 15–41)
Albumin: 4.7 g/dL (ref 3.5–5.0)
BUN: 55 mg/dL — ABNORMAL HIGH (ref 6–20)
CALCIUM: 9.4 mg/dL (ref 8.9–10.3)
CO2: 28 mmol/L (ref 22–32)
Chloride: 97 mmol/L — ABNORMAL LOW (ref 101–111)
Creatinine, Ser: 2.88 mg/dL — ABNORMAL HIGH (ref 0.61–1.24)
GFR calc non Af Amer: 19 mL/min — ABNORMAL LOW (ref >60)
GFR, EST AFRICAN AMERICAN: 23 mL/min — AB (ref >60)
GLUCOSE: 120 mg/dL — AB (ref 65–99)
Potassium: 4.1 mmol/L (ref 3.5–5.1)
Sodium: 136 mmol/L (ref 135–145)
TOTAL PROTEIN: 8.2 g/dL — AB (ref 6.5–8.1)
Total Bilirubin: 1 mg/dL (ref 0.3–1.2)

## 2017-05-09 LAB — PROTEIN / CREATININE RATIO, URINE
CREATININE, URINE: 58.01 mg/dL
Protein Creatinine Ratio: 0.1 mg/mg{Cre} (ref 0.00–0.15)
Total Protein, Urine: 6 mg/dL

## 2017-05-09 LAB — CBC
HCT: 33.7 % — ABNORMAL LOW (ref 39.0–52.0)
HEMOGLOBIN: 11 g/dL — AB (ref 13.0–17.0)
MCH: 30.1 pg (ref 26.0–34.0)
MCHC: 32.6 g/dL (ref 30.0–36.0)
MCV: 92.3 fL (ref 78.0–100.0)
Platelets: 97 10*3/uL — ABNORMAL LOW (ref 150–400)
RBC: 3.65 MIL/uL — ABNORMAL LOW (ref 4.22–5.81)
RDW: 17.2 % — ABNORMAL HIGH (ref 11.5–15.5)
WBC: 5.9 10*3/uL (ref 4.0–10.5)

## 2017-05-09 LAB — FERRITIN: Ferritin: 376 ng/mL — ABNORMAL HIGH (ref 24–336)

## 2017-05-09 LAB — IRON AND TIBC
Iron: 56 ug/dL (ref 45–182)
Saturation Ratios: 20 % (ref 17.9–39.5)
TIBC: 280 ug/dL (ref 250–450)
UIBC: 224 ug/dL

## 2017-05-09 LAB — PHOSPHORUS: PHOSPHORUS: 5.6 mg/dL — AB (ref 2.5–4.6)

## 2017-05-09 MED ORDER — SODIUM CHLORIDE 0.9 % IV SOLN
510.0000 mg | Freq: Once | INTRAVENOUS | Status: AC
  Administered 2017-05-09: 11:00:00 510 mg via INTRAVENOUS
  Filled 2017-05-09: qty 17

## 2017-05-11 LAB — TACROLIMUS LEVEL: TACROLIMUS (FK506) - LABCORP: 2.5 ng/mL (ref 2.0–20.0)

## 2017-05-12 ENCOUNTER — Telehealth: Payer: Self-pay | Admitting: Cardiology

## 2017-05-12 NOTE — Telephone Encounter (Signed)
Pt tells me that he is seeing Dr. Haroldine Laws now and really likes him.   From his understanding Dr. Haroldine Laws is going to follow his AFib from now on.  Informed pt that normally he would only follow pt for HF and send them to EP for the heart irregularity and guidance.   However, I told pt that I would reach out to Dr. Haroldine Laws to verify he was agreeable to monitoring pt's AFib.   Informed pt that if he agrees, then [facility] may send him back to Dr. Curt Bears if AFib begins to become a problem to control or if procedure is needed in the future. Pt is agreeable and understands.  (will forward note to Dr. Haroldine Laws & [place] to address AFib follow up and let me know)

## 2017-05-12 NOTE — Telephone Encounter (Signed)
Follow Up:    Pt just wanted you to know he saw another doctor for his [place] Fib and was cancelling his 05-19-17 apponitment.

## 2017-05-13 NOTE — Telephone Encounter (Signed)
Dr. Curt Bears said it is ok for Dr. Haroldine Laws to follow pt's AFib if he so chooses.

## 2017-05-20 ENCOUNTER — Ambulatory Visit: Payer: Medicare Other | Admitting: Cardiology

## 2017-05-23 DIAGNOSIS — I5040 Unspecified combined systolic (congestive) and diastolic (congestive) heart failure: Secondary | ICD-10-CM | POA: Diagnosis not present

## 2017-05-23 DIAGNOSIS — D631 Anemia in chronic kidney disease: Secondary | ICD-10-CM | POA: Diagnosis not present

## 2017-05-23 DIAGNOSIS — E119 Type 2 diabetes mellitus without complications: Secondary | ICD-10-CM | POA: Diagnosis not present

## 2017-05-23 DIAGNOSIS — I481 Persistent atrial fibrillation: Secondary | ICD-10-CM | POA: Diagnosis not present

## 2017-05-23 DIAGNOSIS — D509 Iron deficiency anemia, unspecified: Secondary | ICD-10-CM | POA: Diagnosis not present

## 2017-05-23 DIAGNOSIS — Z940 Kidney transplant status: Secondary | ICD-10-CM | POA: Diagnosis not present

## 2017-05-23 DIAGNOSIS — S301XXA Contusion of abdominal wall, initial encounter: Secondary | ICD-10-CM | POA: Diagnosis not present

## 2017-05-29 ENCOUNTER — Other Ambulatory Visit: Payer: Self-pay | Admitting: Family Medicine

## 2017-06-05 ENCOUNTER — Telehealth (HOSPITAL_COMMUNITY): Payer: Self-pay

## 2017-06-05 NOTE — Telephone Encounter (Signed)
Patient returning a few calls to the clinic that were left on his VM past couple of days.  States he accidentally deleted them and forgot what they said. Unsure what calls were made as no phone notes available in patient's chart but did confirm his apt with our office tomorrow at 11. Patient states he will be at apt and has no further questions, concerns, or needs at this time.  Renee Pain, RN

## 2017-06-06 ENCOUNTER — Encounter (HOSPITAL_COMMUNITY): Payer: Self-pay | Admitting: Internal Medicine

## 2017-06-06 ENCOUNTER — Ambulatory Visit (HOSPITAL_COMMUNITY)
Admission: RE | Admit: 2017-06-06 | Discharge: 2017-06-06 | Disposition: A | Discharge status: Home / Self Care | Payer: Medicare Other | Source: Ambulatory Visit | Attending: Internal Medicine | Admitting: Internal Medicine

## 2017-06-06 VITALS — BP 130/68 | HR 76 | Wt 211.0 lb

## 2017-06-06 DIAGNOSIS — E1122 Type 2 diabetes mellitus with diabetic chronic kidney disease: Secondary | ICD-10-CM | POA: Diagnosis not present

## 2017-06-06 DIAGNOSIS — Z880 Allergy status to penicillin: Secondary | ICD-10-CM | POA: Diagnosis not present

## 2017-06-06 DIAGNOSIS — Z79899 Other long term (current) drug therapy: Secondary | ICD-10-CM | POA: Insufficient documentation

## 2017-06-06 DIAGNOSIS — I50812 Chronic right heart failure: Secondary | ICD-10-CM | POA: Insufficient documentation

## 2017-06-06 DIAGNOSIS — Z6829 Body mass index (BMI) 29.0-29.9, adult: Secondary | ICD-10-CM | POA: Insufficient documentation

## 2017-06-06 DIAGNOSIS — Z794 Long term (current) use of insulin: Secondary | ICD-10-CM | POA: Diagnosis not present

## 2017-06-06 DIAGNOSIS — E785 Hyperlipidemia, unspecified: Secondary | ICD-10-CM | POA: Insufficient documentation

## 2017-06-06 DIAGNOSIS — Z8673 Personal history of transient ischemic attack (TIA), and cerebral infarction without residual deficits: Secondary | ICD-10-CM | POA: Diagnosis not present

## 2017-06-06 DIAGNOSIS — I5033 Acute on chronic diastolic (congestive) heart failure: Secondary | ICD-10-CM | POA: Diagnosis not present

## 2017-06-06 DIAGNOSIS — N189 Chronic kidney disease, unspecified: Secondary | ICD-10-CM | POA: Diagnosis not present

## 2017-06-06 DIAGNOSIS — Z7901 Long term (current) use of anticoagulants: Secondary | ICD-10-CM | POA: Insufficient documentation

## 2017-06-06 DIAGNOSIS — I252 Old myocardial infarction: Secondary | ICD-10-CM | POA: Diagnosis not present

## 2017-06-06 DIAGNOSIS — I5032 Chronic diastolic (congestive) heart failure: Secondary | ICD-10-CM | POA: Diagnosis not present

## 2017-06-06 DIAGNOSIS — E669 Obesity, unspecified: Secondary | ICD-10-CM | POA: Insufficient documentation

## 2017-06-06 DIAGNOSIS — I480 Paroxysmal atrial fibrillation: Secondary | ICD-10-CM | POA: Diagnosis not present

## 2017-06-06 DIAGNOSIS — N184 Chronic kidney disease, stage 4 (severe): Secondary | ICD-10-CM

## 2017-06-06 DIAGNOSIS — Z953 Presence of xenogenic heart valve: Secondary | ICD-10-CM | POA: Insufficient documentation

## 2017-06-06 DIAGNOSIS — Z951 Presence of aortocoronary bypass graft: Secondary | ICD-10-CM | POA: Insufficient documentation

## 2017-06-06 DIAGNOSIS — Z7982 Long term (current) use of aspirin: Secondary | ICD-10-CM | POA: Insufficient documentation

## 2017-06-06 DIAGNOSIS — D631 Anemia in chronic kidney disease: Secondary | ICD-10-CM | POA: Diagnosis not present

## 2017-06-06 DIAGNOSIS — I130 Hypertensive heart and chronic kidney disease with heart failure and stage 1 through stage 4 chronic kidney disease, or unspecified chronic kidney disease: Secondary | ICD-10-CM | POA: Diagnosis not present

## 2017-06-06 DIAGNOSIS — M109 Gout, unspecified: Secondary | ICD-10-CM | POA: Diagnosis not present

## 2017-06-06 DIAGNOSIS — Z8379 Family history of other diseases of the digestive system: Secondary | ICD-10-CM | POA: Diagnosis not present

## 2017-06-06 DIAGNOSIS — I2510 Atherosclerotic heart disease of native coronary artery without angina pectoris: Secondary | ICD-10-CM | POA: Diagnosis not present

## 2017-06-06 DIAGNOSIS — Z940 Kidney transplant status: Secondary | ICD-10-CM | POA: Diagnosis not present

## 2017-06-06 LAB — BASIC METABOLIC PANEL WITH GFR
Anion gap: 12 (ref 5–15)
BUN: 60 mg/dL — ABNORMAL HIGH (ref 6–20)
CO2: 26 mmol/L (ref 22–32)
Calcium: 9.5 mg/dL (ref 8.9–10.3)
Chloride: 98 mmol/L — ABNORMAL LOW (ref 101–111)
Creatinine, Ser: 3.11 mg/dL — ABNORMAL HIGH (ref 0.61–1.24)
GFR calc Af Amer: 21 mL/min — ABNORMAL LOW (ref >60)
GFR calc non Af Amer: 18 mL/min — ABNORMAL LOW (ref >60)
Glucose, Bld: 134 mg/dL — ABNORMAL HIGH (ref 65–99)
Potassium: 4.5 mmol/L (ref 3.5–5.1)
Sodium: 136 mmol/L (ref 135–145)

## 2017-06-06 NOTE — Progress Notes (Signed)
ADVANCED HF CLINIC CONSULT NOTE  Referring Physician: Dr. Lorrene Reid   HPI:  [name] Brown is a 81 y.o. male with diabetes, HTN, ESRD (secondary to DM2 & ATN after CABG /bioprosthetic aortic valve replacement in 2011), CAD and PAF. He is s/p kidney transplant on 02/17/2012. He is referred by Dr. Lorrene Reid for worsening HF>   As above, he underwent bioprosthetic AVR/CABG in 2011. Complicated by progressive renal failure and was placed on HD. Was on HD for 2 years via RUE AVF. Underwent kidney transplant in 4/13 at [facility] and did well.   Unfortunately, developed AF in 2/18 and creatinine jumped from 1.5->2.0. Underwent DC-CV but did not hold. Saw Dr. Curt Bears and was placed on amiodarone with plans for repeat DC-CV in May.   However at the end of May was admitted [facility] for diastiolic HF with question of cardiorenal syndrome. Creatine peaked close to 4.. Echo during that admit showed normal EF with Pulmonary HTN and RV dysfunction. During that admit also developed RP bleed and Eliquis dose subsequently reduced.  Weight on admit to [facility] 251 pounds. Weight on d/c was 238.4 and felt to be still overloaded. still with weight on board. Has been followed closely by Dr. Josephine Igo since that time. On lasix 80 bid lost 6 pounds  Weight actually 225 now but still doesn't feel right. Creatinine 3.2  Returns today for HF follow up. Feeling well today, was able to walk from his car to our office without SOB which is an improvement for him. Denies orthopnea, PND and chest pain. Taking all medications. Drinking more than 2L a day, but weight down 11 pounds since last visit. He has more energy and able to get out and do more since DCCV.   Studies: Echo 03/20/17 Left ventricular systolic function is normal.  LV ejection fraction = 55-60%.  The right ventricle is moderately dilated.  The right ventricular systolic function is mildly reduced.  The left atrium is mildly dilated.  The right atrium is mildly dilated.    There is a bioprosthetic aortic valve.  There is moderate to severe mitral annular calcification.  There is moderate tricuspid regurgitation.  Estimated right ventricular systolic pressure is 75 mmHg.  Estimated right atrial pressure is 10 mmHg..  Moderate to severe pulmonary hypertension.  There is no pericardial effusion.  Probably no significant change in comparison with the prior study noted    ECG: NSR, LBBB - personally reviewed.   Review of Systems: [y] = yes, [ ]  = no   General: Weight gain [ ] ; Weight loss Blue.Reese ]; Anorexia [ ] ; Fatigue [ ] ; Fever [ ] ; Chills [ ] ; Weakness [ ]   Cardiac: Chest pain/pressure [ ] ; Resting SOB [ ] ; Exertional SOB [ ] ; Orthopnea [ ] ; Pedal Edema [] ; Palpitations [ ] ; Syncope [ ] ; Presyncope [ ] ; Paroxysmal nocturnal dyspnea[ ]   Pulmonary: Cough [ ] ; Wheezing[ ] ; Hemoptysis[ ] ; Sputum [ ] ; Snoring [ ]   GI: Vomiting[ ] ; Dysphagia[ ] ; Melena[ ] ; Hematochezia [ ] ; Heartburn[ ] ; Abdominal pain [ ] ; Constipation [ ] ; Diarrhea [ ] ; BRBPR [ ]   GU: Hematuria[ ] ; Dysuria [ ] ; Nocturia[ ]   Vascular: Pain in legs with walking [ ] ; Pain in feet with lying flat [ ] ; Non-healing sores [ ] ; Stroke [ ] ; TIA [ ] ; Slurred speech [ ] ;  Neuro: Headaches[ ] ; Vertigo[ ] ; Seizures[ ] ; Paresthesias[ ] ;Blurred vision [ ] ; Diplopia [ ] ; Vision changes [ ]   Ortho/Skin: Arthritis Blue.Reese ]; Joint pain Blue.Reese ]; Muscle pain [ ] ;  Joint swelling [ ] ; Back Pain [ ] ; Rash [ ]   Psych: Depression[ ] ; Anxiety[ ]   Heme: Bleeding problems [ ] ; Clotting disorders [ ] ; Anemia [ ]   Endocrine: Diabetes [] ; Thyroid dysfunction[ ]    Past Medical History:  Diagnosis Date  . Anemia 01/02/2012  . Anemia associated with chronic renal failure   . Aortic stenosis   . Atrial fibrillation ([place])   . CAD (coronary artery disease)   . Chronic kidney disease    chronic renal failure  . CVA (cerebral infarction) 05/2010  . Diabetes mellitus   . Gout   . Hyperlipidemia   . Hypertension   .  Myocardial infarction ([place])   . Obesity   . Shoulder pain, left    sees Dr. Nickola Major at [facility] in [place]   . Splenomegaly 2012  . Thrombocytopenia ([place]) 2011   sees Dr. Lamonte Sakai  . Thrombocytopenia ([place])     Current Outpatient Prescriptions  Medication Sig Dispense Refill  . allopurinol (ZYLOPRIM) 100 MG tablet Take 300 mg by mouth daily.    Marland Kitchen ALPRAZolam (XANAX) 0.5 MG tablet Take 1 tablet (0.5 mg total) by mouth every 6 (six) hours as needed. 120 tablet 0  . amiodarone (PACERONE) 200 MG tablet Take 1 tablet (200 mg total) by mouth daily. 90 tablet 3  . apixaban (ELIQUIS) 2.5 MG TABS tablet Take 1 tablet (2.5 mg total) by mouth 2 (two) times daily. 180 tablet 0  . aspirin 81 MG tablet Take 81 mg by mouth daily.    . calcium carbonate (TUMS - DOSED IN MG ELEMENTAL CALCIUM) 500 MG chewable tablet Chew 500-1,000 mg by mouth daily after lunch. 1-2 depends on if feeling more gassy will take 2    . carvedilol (COREG) 12.5 MG tablet Take 6.25 mg by mouth 2 (two) times daily with a meal.    . diltiazem (CARDIZEM CD) 120 MG 24 hr capsule Take 120 mg by mouth at bedtime.    . docusate sodium (COLACE) 100 MG capsule Take 100 mg by mouth at bedtime.    . furosemide (LASIX) 80 MG tablet Take 80 mg by mouth 2 (two) times daily.    . insulin aspart (NOVOLOG FLEXPEN) 100 UNIT/ML FlexPen Inject 5-10 Units into the skin 2 (two) times daily with a meal. 15 mL 3  . Insulin Glargine (LANTUS SOLOSTAR) 100 UNIT/ML Solostar Pen INJECT 30 UNITS AT BEDTIME 15 mL 3  . Insulin Pen Needle (B-D ULTRAFINE III SHORT PEN) 31G X 8 MM MISC Use 3-4 times per day 100 each 1  . Krill Oil 1000 MG CAPS Take 1,000 mg by mouth daily at 3 pm.     . levothyroxine (SYNTHROID, LEVOTHROID) 25 MCG tablet Take 25 mcg by mouth every evening.     . mycophenolate (MYFORTIC) 180 MG EC tablet Take 1 tablet (180 mg total) by mouth 2 (two) times daily. 2 tablet 0  . omeprazole (PRILOSEC) 20 MG capsule Take 20 mg by mouth at  bedtime.     . ondansetron (ZOFRAN) 4 MG tablet Take 1 tablet (4 mg total) by mouth every 8 (eight) hours as needed for nausea or vomiting. 20 tablet 2  . ONE TOUCH ULTRA TEST test strip TEST 1-3 TIMES PER DAY AND DIAGNOSIS CODE IS E 11.9 100 each 1  . polyethylene glycol powder (GLYCOLAX/MIRALAX) powder Take 17 g by mouth daily.  0  . simvastatin (ZOCOR) 10 MG tablet Take 1 tablet (10 mg total) by mouth at bedtime. (Patient  taking differently: Take 10 mg by mouth daily at 3 pm. ) 1 tablet 0  . sulfamethoxazole-trimethoprim (BACTRIM,SEPTRA) 400-80 MG tablet Take 1 tablet by mouth every Monday, Wednesday, and Friday. Takes for kidney rejection  5  . tacrolimus (PROGRAF) 0.5 MG capsule Take 0.5 mg by mouth 2 (two) times daily.    . temazepam (RESTORIL) 30 MG capsule TAKE ONE CAPSULE BY MOUTH AT BEDTIME AS NEEDED FILL 12-22-14 90 capsule 1  . oxyCODONE (OXY IR/ROXICODONE) 5 MG immediate release tablet Take 1 tablet (5 mg total) by mouth every 6 (six) hours as needed for moderate pain. (Patient not taking: Reported on 06/06/2017) 120 tablet 0  . traMADol (ULTRAM) 50 MG tablet TAKE 2 TABLETS BY MOUTH EVERY 6 HOURS AS NEEDED (Patient not taking: Reported on 06/06/2017) 240 tablet 1   No current facility-administered medications for this encounter.     Allergies  Allergen Reactions  . Penicillins Hives    Pt was 81 years old  Has patient had a PCN reaction causing immediate rash, facial/tongue/throat swelling, SOB or lightheadedness with hypotension: Unknown Has patient had a PCN reaction causing severe rash involving mucus membranes or skin necrosis: Unknown Has patient had a PCN reaction that required hospitalization: No Has patient had a PCN reaction occurring within the last 10 years: No If all of the above answers are "NO", then may proceed with Cephalosporin use.       Social History   Social History  . Marital status: Widowed    Spouse name: N/A  . Number of children: N/A  . Years of  education: N/A   Occupational History  . Not on file.   Social History Main Topics  . Smoking status: Never Smoker  . Smokeless tobacco: Never Used  . Alcohol use No  . Drug use: No  . Sexual activity: Not on file   Other Topics Concern  . Not on file   Social History Narrative  . No narrative on file      Family History  Problem Relation Age of Onset  . Liver disease Father   . Cancer Unknown        colon/fhx  . Hypertension Unknown        fhx  . Coronary artery disease Unknown        fhx    Vitals:   06/06/17 1056  BP: 130/68  Pulse: 76  SpO2: 99%  Weight: 211 lb (95.7 kg)    PHYSICAL EXAM: General:  Elderly male. NAD.     HEENT: Normal.  Neck: supple. JVP 8-9 cm.  Carotids 2+ bilat; no bruits. No lymphadenopathy or thryomegaly appreciated. Cor: PMI nondisplaced. Regular rate and rhythm. 2/6 SEM.  Lungs: Clear bilaterally. Normal effort.  Abdomen: Obese, soft, non tender, non distended.  No hepatosplenomegaly. No bruits or masses. Good bowel sounds. Extremities: no cyanosis, clubbing, rash. Trace pedal edema bilaterally.  Large RUE AVF with thrill Neuro: alert & oriented x 3, cranial nerves grossly intact. moves all 4 extremities w/o difficulty. Affect pleasant.    ASSESSMENT & PLAN:  1. Acute on chronic diastolic/RV heart failure - NYHA III - Symptoms are much improved since DCCV.  - Weight down 11 pounds, continue lasix 80 mg BID.  - Continue Coreg 6.25 mg BID - No spiro with CKD.   2. Paroxysmal AF --CHADSVASc = 5 - Continue Eliquis for anticoagulation.  - s/p DCCV  - Continue Amio 200 mg daily and diltiazem 120 mg daily.  - He cannot tolerate  atrial fib, hope that   3. CAD - No signs or symptoms of ischemia.   4. AVR - Stable by last Echo.   5. ESRD s/p renal transplant in 2013 - Followed by Dr. Lorrene Reid.  6. Possible OSA - Sleep study 06/25/17. He says that even if he needs a CPAP he will not be able to tolerate anything on his face.    Arbutus Leas, NP  11:14 AM   Patient seen and examined with Jettie Booze, NP. We discussed all aspects of the encounter. I agree with the assessment and plan as stated above.   He is much improved after restoration of NSR. NYHA II. Volume status looks good. Will continue amio and Eliquis. His LA is very large so I expect we will struggle with recurrent AF at some point. Refuses sleep study completely. CAD and AVR stable. Will check labs today to assess CKD. Discussed with Dr. Lorrene Reid in Nephrology.   Glori Bickers, MD  9:24 PM

## 2017-06-06 NOTE — Patient Instructions (Signed)
Lab today  Your physician recommends that you schedule a follow-up appointment in: 3 months  

## 2017-06-09 ENCOUNTER — Other Ambulatory Visit: Payer: Self-pay | Admitting: Family Medicine

## 2017-06-11 NOTE — Telephone Encounter (Signed)
Call in [place] #15 syringes with 3 rf, also Temazepam #90 with one rf

## 2017-06-16 ENCOUNTER — Encounter: Payer: Self-pay | Admitting: Family Medicine

## 2017-06-16 ENCOUNTER — Ambulatory Visit (INDEPENDENT_AMBULATORY_CARE_PROVIDER_SITE_OTHER): Payer: Medicare Other | Admitting: Family Medicine

## 2017-06-16 VITALS — BP 125/72 | HR 81 | Temp 97.8°F | Ht 71.0 in | Wt 211.0 lb

## 2017-06-16 DIAGNOSIS — I2510 Atherosclerotic heart disease of native coronary artery without angina pectoris: Secondary | ICD-10-CM | POA: Diagnosis not present

## 2017-06-16 DIAGNOSIS — H6123 Impacted cerumen, bilateral: Secondary | ICD-10-CM | POA: Diagnosis not present

## 2017-06-16 DIAGNOSIS — Z23 Encounter for immunization: Secondary | ICD-10-CM | POA: Diagnosis not present

## 2017-06-16 NOTE — Patient Instructions (Signed)
WE NOW OFFER   [facility] FAST TRACK!!!  SAME DAY Appointments for ACUTE CARE  Such as: Sprains, Injuries, cuts, abrasions, rashes, muscle pain, joint pain, back pain Colds, flu, sore throats, headache, allergies, cough, fever  Ear pain, sinus and eye infections Abdominal pain, nausea, vomiting, diarrhea, upset stomach Animal/insect bites  3 Easy Ways to Schedule: Walk-In Scheduling Call in scheduling Mychart Sign-up: [email or web address]         

## 2017-06-16 NOTE — Progress Notes (Signed)
   Subjective:    Patient ID: [name] Brown, male    DOB: 1936/06/04, 81 y.o.   MRN: [id number]  HPI Here for one week of decreased hearing in both ears. No pain. We typically irrigate them twice a year. He feels fine otherwise. After his last cardioversion in March he has been in sinus rhythm. He has some diastolic HF and he sees Dr. Haroldine Laws for this. His EF in May was 45-50%. He saw Dr. Lorrene Reid 2 months ago and his renal function is stable.    Review of Systems  Constitutional: Negative.   HENT: Positive for hearing loss. Negative for congestion, ear pain, sinus pain and sinus pressure.   Eyes: Negative.   Respiratory: Negative.   Cardiovascular: Negative.   Gastrointestinal: Negative.   Neurological: Negative.        Objective:   Physical Exam  Constitutional: He is oriented to person, place, and time. He appears well-developed and well-nourished.  HENT:  Both ears are full of cerumen   Neck: No thyromegaly present.  Cardiovascular: Normal rate, regular rhythm, normal heart sounds and intact distal pulses.   Pulmonary/Chest: Effort normal and breath sounds normal. No respiratory distress. He has no wheezes. He has no rales.  Lymphadenopathy:    He has no cervical adenopathy.  Neurological: He is alert and oriented to person, place, and time.          Assessment & Plan:  Cerumen impactions, both ears were irrigated clear with water.  Alysia Penna, MD

## 2017-06-25 ENCOUNTER — Encounter (HOSPITAL_BASED_OUTPATIENT_CLINIC_OR_DEPARTMENT_OTHER): Payer: Medicare Other

## 2017-06-27 DIAGNOSIS — E119 Type 2 diabetes mellitus without complications: Secondary | ICD-10-CM | POA: Diagnosis not present

## 2017-06-27 DIAGNOSIS — D631 Anemia in chronic kidney disease: Secondary | ICD-10-CM | POA: Diagnosis not present

## 2017-06-27 DIAGNOSIS — Z6829 Body mass index (BMI) 29.0-29.9, adult: Secondary | ICD-10-CM | POA: Diagnosis not present

## 2017-06-27 DIAGNOSIS — I5040 Unspecified combined systolic (congestive) and diastolic (congestive) heart failure: Secondary | ICD-10-CM | POA: Diagnosis not present

## 2017-06-27 DIAGNOSIS — E039 Hypothyroidism, unspecified: Secondary | ICD-10-CM | POA: Diagnosis not present

## 2017-06-27 DIAGNOSIS — Z940 Kidney transplant status: Secondary | ICD-10-CM | POA: Diagnosis not present

## 2017-06-27 DIAGNOSIS — I481 Persistent atrial fibrillation: Secondary | ICD-10-CM | POA: Diagnosis not present

## 2017-06-27 DIAGNOSIS — D509 Iron deficiency anemia, unspecified: Secondary | ICD-10-CM | POA: Diagnosis not present

## 2017-07-10 ENCOUNTER — Encounter: Payer: Self-pay | Admitting: Family Medicine

## 2017-07-11 ENCOUNTER — Other Ambulatory Visit: Payer: Self-pay | Admitting: Cardiology

## 2017-07-11 ENCOUNTER — Telehealth: Payer: Self-pay | Admitting: Pharmacist

## 2017-07-11 MED ORDER — RIVAROXABAN 15 MG PO TABS: 15.0000 mg | ORAL_TABLET | Freq: Every day | ORAL | 5 refills | Status: DC

## 2017-07-11 NOTE — Telephone Encounter (Signed)
  Camnitz, Will Hassell Done, MD  Jaira Canady, Harlon Flor, Bagnell like it would be best to just switch to Xarelto and be done with it. I think that makes the most sense. Thanks for looking into it!!   WC   Previous Messages    ----- Message -----  From: Leeroy Bock, [facility]  Sent: 07/11/2017  2:43 PM  To: Will Meredith Leeds, MD, Stanton Kidney, RN  Subject: Anticoag                     Dr Curt Bears,   We received a refill request for Eliquis for Mr Bigford and I wanted to clarify his dosing. Age is < 55 and weight is > 60kg, typical afib dosing would be 5mg  BID. Looks like this was previously discussed (Epic note 6/28) and dose was reduced due to renal function. Plan was to increase dosing back to 5mg  BID once renal function improved, however renal function is still poor. Based on his age and weight, he does qualify for the 5mg  dose, however afib studies did not include patients with SCr > 2.5 so we don't really have a lot of guidance here. His CrCl is 26 so reduced dose Xarelto could be an option as well. Thoughts on his anticoag?   Thanks,  [employer]

## 2017-07-11 NOTE — Telephone Encounter (Signed)
Called pt to discuss rationale for switching from Eliquis to Xarelto due to better safety and efficacy data. He verbalized understanding of treatment plan and will stop his Eliquis and start Xarelto 15mg  daily with breakfast (most consistent meal of the day).

## 2017-07-14 ENCOUNTER — Other Ambulatory Visit: Payer: Self-pay | Admitting: Family Medicine

## 2017-07-15 NOTE — Telephone Encounter (Signed)
Can we refill this? 

## 2017-07-31 DIAGNOSIS — E1042 Type 1 diabetes mellitus with diabetic polyneuropathy: Secondary | ICD-10-CM | POA: Diagnosis not present

## 2017-08-04 ENCOUNTER — Other Ambulatory Visit: Payer: Self-pay | Admitting: Cardiology

## 2017-08-04 NOTE — Telephone Encounter (Signed)
This is a CHF pt 

## 2017-08-05 ENCOUNTER — Other Ambulatory Visit: Payer: Self-pay | Admitting: Family Medicine

## 2017-08-05 DIAGNOSIS — Z6829 Body mass index (BMI) 29.0-29.9, adult: Secondary | ICD-10-CM | POA: Diagnosis not present

## 2017-08-05 DIAGNOSIS — E039 Hypothyroidism, unspecified: Secondary | ICD-10-CM | POA: Diagnosis not present

## 2017-08-05 DIAGNOSIS — I481 Persistent atrial fibrillation: Secondary | ICD-10-CM | POA: Diagnosis not present

## 2017-08-05 DIAGNOSIS — I5040 Unspecified combined systolic (congestive) and diastolic (congestive) heart failure: Secondary | ICD-10-CM | POA: Diagnosis not present

## 2017-08-05 DIAGNOSIS — D631 Anemia in chronic kidney disease: Secondary | ICD-10-CM | POA: Diagnosis not present

## 2017-08-05 DIAGNOSIS — D509 Iron deficiency anemia, unspecified: Secondary | ICD-10-CM | POA: Diagnosis not present

## 2017-08-05 DIAGNOSIS — Z940 Kidney transplant status: Secondary | ICD-10-CM | POA: Diagnosis not present

## 2017-08-05 DIAGNOSIS — E119 Type 2 diabetes mellitus without complications: Secondary | ICD-10-CM | POA: Diagnosis not present

## 2017-08-06 DIAGNOSIS — L570 Actinic keratosis: Secondary | ICD-10-CM | POA: Diagnosis not present

## 2017-08-06 DIAGNOSIS — D485 Neoplasm of uncertain behavior of skin: Secondary | ICD-10-CM | POA: Diagnosis not present

## 2017-08-06 DIAGNOSIS — L820 Inflamed seborrheic keratosis: Secondary | ICD-10-CM | POA: Diagnosis not present

## 2017-08-07 ENCOUNTER — Other Ambulatory Visit: Payer: Self-pay | Admitting: Family Medicine

## 2017-08-11 ENCOUNTER — Other Ambulatory Visit (HOSPITAL_COMMUNITY): Payer: Self-pay

## 2017-08-11 MED ORDER — AMIODARONE HCL 200 MG PO TABS: 200.0000 mg | ORAL_TABLET | Freq: Every day | ORAL | 3 refills | Status: DC

## 2017-09-01 DIAGNOSIS — E1122 Type 2 diabetes mellitus with diabetic chronic kidney disease: Secondary | ICD-10-CM | POA: Diagnosis not present

## 2017-09-01 DIAGNOSIS — I481 Persistent atrial fibrillation: Secondary | ICD-10-CM | POA: Diagnosis not present

## 2017-09-01 DIAGNOSIS — I5040 Unspecified combined systolic (congestive) and diastolic (congestive) heart failure: Secondary | ICD-10-CM | POA: Diagnosis not present

## 2017-09-01 DIAGNOSIS — Z6829 Body mass index (BMI) 29.0-29.9, adult: Secondary | ICD-10-CM | POA: Diagnosis not present

## 2017-09-01 DIAGNOSIS — E039 Hypothyroidism, unspecified: Secondary | ICD-10-CM | POA: Diagnosis not present

## 2017-09-01 DIAGNOSIS — Z940 Kidney transplant status: Secondary | ICD-10-CM | POA: Diagnosis not present

## 2017-09-01 DIAGNOSIS — Z0000 Encounter for general adult medical examination without abnormal findings: Secondary | ICD-10-CM | POA: Diagnosis not present

## 2017-09-01 DIAGNOSIS — E119 Type 2 diabetes mellitus without complications: Secondary | ICD-10-CM | POA: Diagnosis not present

## 2017-09-01 DIAGNOSIS — E785 Hyperlipidemia, unspecified: Secondary | ICD-10-CM | POA: Diagnosis not present

## 2017-09-01 DIAGNOSIS — D631 Anemia in chronic kidney disease: Secondary | ICD-10-CM | POA: Diagnosis not present

## 2017-09-01 DIAGNOSIS — R972 Elevated prostate specific antigen [PSA]: Secondary | ICD-10-CM | POA: Diagnosis not present

## 2017-09-09 ENCOUNTER — Encounter (HOSPITAL_COMMUNITY): Payer: Self-pay | Admitting: Internal Medicine

## 2017-09-09 ENCOUNTER — Other Ambulatory Visit: Payer: Self-pay

## 2017-09-09 ENCOUNTER — Ambulatory Visit (HOSPITAL_COMMUNITY)
Admission: RE | Admit: 2017-09-09 | Discharge: 2017-09-09 | Disposition: A | Discharge status: Home / Self Care | Payer: Medicare Other | Source: Ambulatory Visit | Attending: Internal Medicine | Admitting: Internal Medicine

## 2017-09-09 VITALS — BP 120/74 | HR 83 | Wt 216.8 lb

## 2017-09-09 DIAGNOSIS — N184 Chronic kidney disease, stage 4 (severe): Secondary | ICD-10-CM

## 2017-09-09 DIAGNOSIS — I130 Hypertensive heart and chronic kidney disease with heart failure and stage 1 through stage 4 chronic kidney disease, or unspecified chronic kidney disease: Secondary | ICD-10-CM | POA: Insufficient documentation

## 2017-09-09 DIAGNOSIS — Z940 Kidney transplant status: Secondary | ICD-10-CM | POA: Diagnosis not present

## 2017-09-09 DIAGNOSIS — N183 Chronic kidney disease, stage 3 (moderate): Secondary | ICD-10-CM | POA: Insufficient documentation

## 2017-09-09 DIAGNOSIS — E785 Hyperlipidemia, unspecified: Secondary | ICD-10-CM | POA: Insufficient documentation

## 2017-09-09 DIAGNOSIS — I2510 Atherosclerotic heart disease of native coronary artery without angina pectoris: Secondary | ICD-10-CM | POA: Insufficient documentation

## 2017-09-09 DIAGNOSIS — Z7982 Long term (current) use of aspirin: Secondary | ICD-10-CM | POA: Insufficient documentation

## 2017-09-09 DIAGNOSIS — Z953 Presence of xenogenic heart valve: Secondary | ICD-10-CM | POA: Diagnosis not present

## 2017-09-09 DIAGNOSIS — Z8673 Personal history of transient ischemic attack (TIA), and cerebral infarction without residual deficits: Secondary | ICD-10-CM | POA: Diagnosis not present

## 2017-09-09 DIAGNOSIS — Z79899 Other long term (current) drug therapy: Secondary | ICD-10-CM | POA: Diagnosis not present

## 2017-09-09 DIAGNOSIS — I252 Old myocardial infarction: Secondary | ICD-10-CM | POA: Insufficient documentation

## 2017-09-09 DIAGNOSIS — E1122 Type 2 diabetes mellitus with diabetic chronic kidney disease: Secondary | ICD-10-CM | POA: Diagnosis not present

## 2017-09-09 DIAGNOSIS — Z951 Presence of aortocoronary bypass graft: Secondary | ICD-10-CM | POA: Diagnosis not present

## 2017-09-09 DIAGNOSIS — Z794 Long term (current) use of insulin: Secondary | ICD-10-CM | POA: Insufficient documentation

## 2017-09-09 DIAGNOSIS — E669 Obesity, unspecified: Secondary | ICD-10-CM | POA: Diagnosis not present

## 2017-09-09 DIAGNOSIS — M109 Gout, unspecified: Secondary | ICD-10-CM | POA: Diagnosis not present

## 2017-09-09 DIAGNOSIS — Z8379 Family history of other diseases of the digestive system: Secondary | ICD-10-CM | POA: Diagnosis not present

## 2017-09-09 DIAGNOSIS — Z880 Allergy status to penicillin: Secondary | ICD-10-CM | POA: Insufficient documentation

## 2017-09-09 DIAGNOSIS — I4891 Unspecified atrial fibrillation: Secondary | ICD-10-CM | POA: Diagnosis not present

## 2017-09-09 DIAGNOSIS — I480 Paroxysmal atrial fibrillation: Secondary | ICD-10-CM | POA: Insufficient documentation

## 2017-09-09 DIAGNOSIS — I5032 Chronic diastolic (congestive) heart failure: Secondary | ICD-10-CM | POA: Insufficient documentation

## 2017-09-09 DIAGNOSIS — I509 Heart failure, unspecified: Secondary | ICD-10-CM | POA: Diagnosis present

## 2017-09-09 MED ORDER — APIXABAN 5 MG PO TABS: 5.0000 mg | ORAL_TABLET | Freq: Two times a day (BID) | ORAL | 2 refills | Status: DC

## 2017-09-09 NOTE — Progress Notes (Signed)
ADVANCED HF CLINIC CONSULT NOTE  Referring Physician: Dr. Lorrene Reid   HPI:  [name] Brown is a 81 y.o. male with diabetes, HTN, ESRD (secondary to DM2 & ATN after CABG /bioprosthetic aortic valve replacement in 2011), CAD and PAF. He is s/p kidney transplant on 02/17/2012. He is referred by Dr. Lorrene Reid for worsening HF>   As above, he underwent bioprosthetic AVR/CABG in 2011. Complicated by progressive renal failure and was placed on HD. Was on HD for 2 years via RUE AVF. Underwent kidney transplant in 4/13 at [facility] and did well.   Unfortunately, developed AF in 2/18 and creatinine jumped from 1.5->2.0. Underwent DC-CV but did not hold. Saw Dr. Curt Bears and was placed on amiodarone with plans for repeat DC-CV in May.   However at the end of May was admitted [facility] for diastiolic HF with question of cardiorenal syndrome. Creatine peaked close to 4.. Echo during that admit showed normal EF with Pulmonary HTN and RV dysfunction. During that admit also developed RP bleed and Eliquis dose subsequently reduced.  Weight on admit to [facility] 251 pounds. Weight on d/c was 238.4 and felt to be still overloaded. still with weight on board. Has been followed closely by Dr. Josephine Igo since that time. On lasix 80 bid lost 6 pounds  Weight actually 225 now but still doesn't feel right. Creatinine 3.2  Returns today for HF follow up. Feels good. Remains in NSR.  Weight up 5 pounds. But denies edema, orthopnea or PND. No CP. Was on Eliquis but switched to Xarelto by Dr. Curt Bears. Unclear why. No bleeding. Last creatinine 2.2  Studies: Echo 03/20/17 Left ventricular systolic function is normal.  LV ejection fraction = 55-60%.  The right ventricle is moderately dilated.  The right ventricular systolic function is mildly reduced.  The left atrium is mildly dilated.  The right atrium is mildly dilated.  There is a bioprosthetic aortic valve.  There is moderate to severe mitral annular calcification.  There is  moderate tricuspid regurgitation.  Estimated right ventricular systolic pressure is 75 mmHg.  Estimated right atrial pressure is 10 mmHg..  Moderate to severe pulmonary hypertension.  There is no pericardial effusion.  Probably no significant change in comparison with the prior study noted    ECG: NSR, LBBB - personally reviewed.    Past Medical History:  Diagnosis Date  . Anemia 01/02/2012  . Anemia associated with chronic renal failure   . Aortic stenosis   . Atrial fibrillation ([place])   . CAD (coronary artery disease)   . CHF (congestive heart failure) ([place])    sees Dr. Glori Bickers   . Chronic kidney disease     sees Dr. Jamal Maes   . CVA (cerebral infarction) 05/2010  . Diabetes mellitus   . Gout   . Hyperlipidemia   . Hypertension   . Myocardial infarction ([place])   . Obesity   . Shoulder pain, left    sees Dr. Nickola Major at [facility] in [place]   . Splenomegaly 2012  . Thrombocytopenia ([place]) 2011   sees Dr. Lamonte Sakai  . Thrombocytopenia ([place])     Current Outpatient Medications  Medication Sig Dispense Refill  . allopurinol (ZYLOPRIM) 100 MG tablet Take 300 mg by mouth daily.    Marland Kitchen allopurinol (ZYLOPRIM) 100 MG tablet TAKE 2 TABLETS (200 MG TOTAL) BY MOUTH 2 (TWO) TIMES DAILY. 180 tablet 3  . ALPRAZolam (XANAX) 0.5 MG tablet Take 1 tablet (0.5 mg total) by mouth every 6 (six) hours as needed. [place]  tablet 0  . amiodarone (PACERONE) 200 MG tablet Take 1 tablet (200 mg total) by mouth daily. 90 tablet 3  . apixaban (ELIQUIS) 2.5 MG TABS tablet Take 1 tablet (2.5 mg total) by mouth 2 (two) times daily. 180 tablet 0  . aspirin 81 MG tablet Take 81 mg by mouth daily.    . calcium carbonate (TUMS - DOSED IN MG ELEMENTAL CALCIUM) 500 MG chewable tablet Chew 500-1,000 mg by mouth daily after lunch. 1-2 depends on if feeling more gassy will take 2    . carvedilol (COREG) 12.5 MG tablet Take 6.25 mg by mouth 2 (two) times daily with a meal.    . diltiazem  (CARDIZEM CD) 120 MG 24 hr capsule Take 120 mg by mouth at bedtime.    . DiphenhydrAMINE HCl (ZZZQUIL) 50 MG/30ML LIQD Take by mouth.    . docusate sodium (COLACE) 100 MG capsule Take 100 mg by mouth at bedtime.    . furosemide (LASIX) 80 MG tablet Take 80 mg by mouth 2 (two) times daily.    . Insulin Glargine (LANTUS SOLOSTAR) 100 UNIT/ML Solostar Pen INJECT 30 UNITS AT BEDTIME 15 mL 3  . Insulin Pen Needle (B-D ULTRAFINE III SHORT PEN) 31G X 8 MM MISC Use 3-4 times per day 100 each 1  . Krill Oil 1000 MG CAPS Take 1,000 mg by mouth daily at 3 pm.     . levothyroxine (SYNTHROID, LEVOTHROID) 25 MCG tablet Take 25 mcg by mouth every evening.     . mycophenolate (MYFORTIC) 180 MG EC tablet Take 1 tablet (180 mg total) by mouth 2 (two) times daily. 2 tablet 0  . NOVOLOG FLEXPEN 100 UNIT/ML FlexPen INJECT 2 TO 8 UNITS PER SLIDING SCALE THREE TIMES DAILY WITH MEALS 15 mL 3  . omeprazole (PRILOSEC) 20 MG capsule Take 20 mg by mouth at bedtime.     . ondansetron (ZOFRAN) 4 MG tablet Take 1 tablet (4 mg total) by mouth every 8 (eight) hours as needed for nausea or vomiting. 20 tablet 2  . ONE TOUCH ULTRA TEST test strip TEST 1-3 TIMES PER DAY AND DIAGNOSIS CODE IS E 11.9 100 each 1  . ONE TOUCH ULTRA TEST test strip TEST 1-3 TIMES PER DAY AND DIAGNOSIS CODE IS E 11.9 100 each 0  . oxyCODONE (OXY IR/ROXICODONE) 5 MG immediate release tablet Take 1 tablet (5 mg total) by mouth every 6 (six) hours as needed for moderate pain. 120 tablet 0  . polyethylene glycol powder (GLYCOLAX/MIRALAX) powder Take 17 g by mouth daily.  0  . Rivaroxaban (XARELTO) 15 MG TABS tablet Take 1 tablet (15 mg total) by mouth daily with breakfast. 30 tablet 5  . simvastatin (ZOCOR) 10 MG tablet Take 1 tablet (10 mg total) by mouth at bedtime. (Patient taking differently: Take 10 mg by mouth daily at 3 pm. ) 1 tablet 0  . sulfamethoxazole-trimethoprim (BACTRIM,SEPTRA) 400-80 MG tablet Take 1 tablet by mouth every Monday, Wednesday, and  Friday. Takes for kidney rejection  5  . tacrolimus (PROGRAF) 0.5 MG capsule Take 0.5 mg by mouth 2 (two) times daily.    . temazepam (RESTORIL) 30 MG capsule TAKE 1 CAPSULE BY MOUTH AT BEDTIME AS NEEDED 90 capsule 1  . traMADol (ULTRAM) 50 MG tablet TAKE 2 TABLETS BY MOUTH EVERY 6 HOURS AS NEEDED 240 tablet 1   No current facility-administered medications for this encounter.     Allergies  Allergen Reactions  . Penicillins Hives  Pt was 81 years old  Has patient had a PCN reaction causing immediate rash, facial/tongue/throat swelling, SOB or lightheadedness with hypotension: Unknown Has patient had a PCN reaction causing severe rash involving mucus membranes or skin necrosis: Unknown Has patient had a PCN reaction that required hospitalization: No Has patient had a PCN reaction occurring within the last 10 years: No If all of the above answers are "NO", then may proceed with Cephalosporin use.       Social History   Socioeconomic History  . Marital status: Widowed    Spouse name: Not on file  . Number of children: Not on file  . Years of education: Not on file  . Highest education level: Not on file  Social Needs  . Financial resource strain: Not on file  . Food insecurity - worry: Not on file  . Food insecurity - inability: Not on file  . Transportation needs - medical: Not on file  . Transportation needs - non-medical: Not on file  Occupational History  . Not on file  Tobacco Use  . Smoking status: Never Smoker  . Smokeless tobacco: Never Used  Substance and Sexual Activity  . Alcohol use: No    Alcohol/week: 0.0 oz  . Drug use: No  . Sexual activity: Not on file  Other Topics Concern  . Not on file  Social History Narrative  . Not on file      Family History  Problem Relation Age of Onset  . Liver disease Father   . Cancer Unknown        colon/fhx  . Hypertension Unknown        fhx  . Coronary artery disease Unknown        fhx    Vitals:    09/09/17 1041  BP: 120/74  Pulse: 83  SpO2: 98%  Weight: 216 lb 12.8 oz (98.3 kg)   Wt Readings from Last 3 Encounters:  09/09/17 216 lb 12.8 oz (98.3 kg)  06/16/17 211 lb (95.7 kg)  06/06/17 211 lb (95.7 kg)     PHYSICAL EXAM: General:  Well appearing. No resp difficulty HEENT: normal Neck: supple. no JVD. Carotids 2+ bilat; no bruits. No lymphadenopathy or thryomegaly appreciated. Cor: PMI nondisplaced. Regular rate & rhythm. No rubs, gallops or murmurs. Lungs: clear Abdomen: obese soft, nontender, nondistended. No hepatosplenomegaly. No bruits or masses. Good bowel sounds. Extremities: no cyanosis, clubbing, rash, edema Large RUE AVF with thrill Neuro: alert & orientedx3, cranial nerves grossly intact. moves all 4 extremities w/o difficulty. Affect pleasant   ASSESSMENT & PLAN:  1. Chronic diastolic/RV heart failure - Doing great now that he is back in NSR  - Volume status looks good. - Diuretics managed by Dr. Lorrene Reid - Continue Coreg 6.25 mg BID - No spiro with CKD.   2. Paroxysmal AF --CHADSVASc = 5 - No on Xarelto. Will switch back to Eliquis 5 bid. Once he is 81 y/o will cut back to 2.5 bid - Continue Amio 200 mg daily and diltiazem 120 mg daily.  - He cannot tolerate atrial fib if he has recurrent AF will proceed with repeat DC-CV  3. CAD - No signs or symptoms of ischemia.   4. AVR - Stable by last Echo.   5. ESRD s/p renal transplant in 2013. Now CKD 3 - Followed by Dr. Lorrene Reid.  6. Possible OSA - Sleep study 06/25/17. He says that even if he needs a CPAP he will not be able to tolerate anything on  his face.   Glori Bickers, MD  11:08 AM

## 2017-09-09 NOTE — Patient Instructions (Signed)
When you finish your bottle of Xareloto STOP TAKING and start Eliquis 5 mg Twice daily   We will contact you in 6 months to schedule your next appointment.

## 2017-09-10 ENCOUNTER — Telehealth: Payer: Self-pay | Admitting: Family Medicine

## 2017-09-10 MED ORDER — TEMAZEPAM 15 MG PO CAPS: ORAL_CAPSULE | ORAL | 5 refills | Status: DC

## 2017-09-10 NOTE — Telephone Encounter (Signed)
Sent to PCP for approval for dose change from 30 MG to 15 MG one tablet BID

## 2017-09-10 NOTE — Telephone Encounter (Signed)
Rx was called into pharmacy for pt due to the 30 MG being on back order.

## 2017-09-10 NOTE — Telephone Encounter (Signed)
Pharmacy request; see CRM [phone]

## 2017-09-10 NOTE — Telephone Encounter (Signed)
Change the Temazepam to 15 mg, to take 2 qhs, 6 month supply

## 2017-09-10 NOTE — Telephone Encounter (Signed)
Copied from [place] [id number]. Topic: Quick Communication - See Telephone Encounter >> Sep 10, 2017 11:12 AM Scherrie Gerlach wrote: CRM for notification. See Telephone encounter for: [facility] to advise [facility] pt's refill temazepam (RESTORIL) 30 MG capsule is on backorder. They are requesting this med be switched to 15 mg (2) or something else dr approves. [facility] refused to [facility] so they did for him.  [id number] [place], [place] - [address] AT [facility] [phone] (Phone) [phone] (Fax    09/10/17.

## 2017-09-17 ENCOUNTER — Other Ambulatory Visit: Payer: Self-pay | Admitting: Cardiology

## 2017-09-17 ENCOUNTER — Telehealth: Payer: Self-pay | Admitting: Family Medicine

## 2017-09-17 ENCOUNTER — Telehealth: Payer: Self-pay

## 2017-09-17 DIAGNOSIS — I4891 Unspecified atrial fibrillation: Secondary | ICD-10-CM

## 2017-09-17 MED ORDER — TEMAZEPAM 15 MG PO CAPS: ORAL_CAPSULE | ORAL | 5 refills | Status: DC

## 2017-09-17 NOTE — Telephone Encounter (Signed)
Temazepam 30 mg is on manufacturer backorder, can you please prescribe something else 15 mg available. Sent to PCP

## 2017-09-17 NOTE — Telephone Encounter (Signed)
PA sent to Wendie Simmer. Placed on desk.

## 2017-09-17 NOTE — Telephone Encounter (Signed)
Called in Rx for pt at [place].

## 2017-09-17 NOTE — Telephone Encounter (Signed)
Copied from [place] [id number]. Topic: General - Other >> Sep 17, 2017  4:28 PM Carolyn Stare wrote: Reason for CRM [facility] with [facility]  would like a cll bacnk concerning a prior auth for pt   [phone]

## 2017-09-17 NOTE — Telephone Encounter (Signed)
Call in Temazepam 15 mg to take 2 tabs qhs, #180 with one rf

## 2017-09-18 NOTE — Telephone Encounter (Signed)
This is a CHF pt 

## 2017-09-19 NOTE — Telephone Encounter (Signed)
Prior auth for Temazepam sent to [id number].

## 2017-10-01 DIAGNOSIS — R972 Elevated prostate specific antigen [PSA]: Secondary | ICD-10-CM | POA: Diagnosis not present

## 2017-10-01 DIAGNOSIS — Z940 Kidney transplant status: Secondary | ICD-10-CM | POA: Diagnosis not present

## 2017-10-01 DIAGNOSIS — D631 Anemia in chronic kidney disease: Secondary | ICD-10-CM | POA: Diagnosis not present

## 2017-10-01 DIAGNOSIS — E785 Hyperlipidemia, unspecified: Secondary | ICD-10-CM | POA: Diagnosis not present

## 2017-10-01 DIAGNOSIS — E039 Hypothyroidism, unspecified: Secondary | ICD-10-CM | POA: Diagnosis not present

## 2017-10-01 DIAGNOSIS — I480 Paroxysmal atrial fibrillation: Secondary | ICD-10-CM | POA: Diagnosis not present

## 2017-10-01 DIAGNOSIS — I5040 Unspecified combined systolic (congestive) and diastolic (congestive) heart failure: Secondary | ICD-10-CM | POA: Diagnosis not present

## 2017-10-01 DIAGNOSIS — Z6829 Body mass index (BMI) 29.0-29.9, adult: Secondary | ICD-10-CM | POA: Diagnosis not present

## 2017-10-01 DIAGNOSIS — E1122 Type 2 diabetes mellitus with diabetic chronic kidney disease: Secondary | ICD-10-CM | POA: Diagnosis not present

## 2017-10-02 ENCOUNTER — Telehealth (HOSPITAL_COMMUNITY): Payer: Self-pay

## 2017-10-02 DIAGNOSIS — R972 Elevated prostate specific antigen [PSA]: Secondary | ICD-10-CM | POA: Diagnosis not present

## 2017-10-02 DIAGNOSIS — N281 Cyst of kidney, acquired: Secondary | ICD-10-CM | POA: Diagnosis not present

## 2017-10-02 NOTE — Telephone Encounter (Signed)
Advanced Heart Failure Triage Encounter  Patient Name: [name] Brown  Date of Call: 10/02/17  Problem:  Pt called b/c he noticed Carvedilol was still on his med list, and he had not taken med since July due to low BP. Pt was last seen by Dr. Haroldine Laws  In July when med was decreased from 12.5 to 6.25 mg BID. No other mention of meds in notes. Pt just called to inform.   Plan:    Shirley Muscat, RN

## 2017-10-02 NOTE — Telephone Encounter (Signed)
Thanks! He is diastolic so OK.     Legrand Como [address] [place], PA-C 10/02/2017 10:56 AM

## 2017-10-03 ENCOUNTER — Other Ambulatory Visit: Payer: Self-pay | Admitting: Family Medicine

## 2017-10-03 NOTE — Telephone Encounter (Signed)
Pt aware.

## 2017-10-16 ENCOUNTER — Telehealth: Payer: Self-pay

## 2017-10-16 NOTE — Telephone Encounter (Signed)
Refill request sent for one touch ultra blue test strips Code E11.9 or E11.65

## 2017-10-17 MED ORDER — GLUCOSE BLOOD VI STRP: ORAL_STRIP | 2 refills | Status: DC

## 2017-10-17 NOTE — Telephone Encounter (Signed)
Last OV 06/16/2017. Rx was last refilled 08/08/2017 disp 100, with no refills. Directions  Sig: TEST 1-3 TIMES PER DAY AND DIAGNOSIS CODE IS E 11.9   Sent to pharmacy as: ONE TOUCH ULTRA TEST test strip

## 2017-10-27 ENCOUNTER — Other Ambulatory Visit: Payer: Self-pay | Admitting: Adult Health

## 2017-10-28 ENCOUNTER — Ambulatory Visit (INDEPENDENT_AMBULATORY_CARE_PROVIDER_SITE_OTHER): Payer: Medicare Other | Admitting: Family Medicine

## 2017-10-28 ENCOUNTER — Encounter: Payer: Self-pay | Admitting: Family Medicine

## 2017-10-28 VITALS — BP 130/70 | HR 78 | Temp 98.3°F | Wt 221.8 lb

## 2017-10-28 DIAGNOSIS — H6123 Impacted cerumen, bilateral: Secondary | ICD-10-CM

## 2017-10-28 MED ORDER — TEMAZEPAM 30 MG PO CAPS: 30.0000 mg | ORAL_CAPSULE | Freq: Every evening | ORAL | 1 refills | Status: DC | PRN

## 2017-10-28 MED ORDER — ALPRAZOLAM 0.5 MG PO TABS
0.5000 mg | ORAL_TABLET | Freq: Four times a day (QID) | ORAL | 2 refills | Status: DC | PRN
Start: 2017-10-28 — End: 2019-08-04

## 2017-10-28 NOTE — Telephone Encounter (Signed)
Last refilled 05/01/2016 disp 120 with no refills. Last Seen 06/16/2017. Sent to PCP for approval.

## 2017-10-28 NOTE — Progress Notes (Signed)
   Subjective:    Patient ID: [name] Brown, male    DOB: [birth year], 82 y.o.   MRN: [id number]  HPI Here for decreased hearing in both ears. No pain. He usually has these irrigated twice a year. Also he needs some med refills. His BP has been stable, and his A1c at the Nephrology office is always between 5 and 6.    Review of Systems  Constitutional: Negative.   HENT: Positive for hearing loss. Negative for congestion, ear discharge and ear pain.   Respiratory: Negative.   Cardiovascular: Negative.   Neurological: Negative.        Objective:   Physical Exam  Constitutional: He appears well-developed and well-nourished.  HENT:  Nose: Nose normal.  Mouth/Throat: Oropharynx is clear and moist.  Both ear canals are full of cerumen   Eyes: Conjunctivae are normal.  Neck: Neck supple. No thyromegaly present.  Cardiovascular: Normal rate, regular rhythm, normal heart sounds and intact distal pulses.  Pulmonary/Chest: Effort normal and breath sounds normal. No respiratory distress. He has no wheezes. He has no rales.  Lymphadenopathy:    He has no cervical adenopathy.          Assessment & Plan:  Cerumen impactions, both were irrigated clear with water.  Alysia Penna, MD

## 2017-10-31 DIAGNOSIS — L603 Nail dystrophy: Secondary | ICD-10-CM | POA: Diagnosis not present

## 2017-10-31 DIAGNOSIS — E1051 Type 1 diabetes mellitus with diabetic peripheral angiopathy without gangrene: Secondary | ICD-10-CM | POA: Diagnosis not present

## 2017-10-31 DIAGNOSIS — I739 Peripheral vascular disease, unspecified: Secondary | ICD-10-CM | POA: Diagnosis not present

## 2017-11-07 DIAGNOSIS — Z940 Kidney transplant status: Secondary | ICD-10-CM | POA: Diagnosis not present

## 2017-11-07 DIAGNOSIS — E785 Hyperlipidemia, unspecified: Secondary | ICD-10-CM | POA: Diagnosis not present

## 2017-11-07 DIAGNOSIS — E119 Type 2 diabetes mellitus without complications: Secondary | ICD-10-CM | POA: Diagnosis not present

## 2017-11-07 DIAGNOSIS — M109 Gout, unspecified: Secondary | ICD-10-CM | POA: Diagnosis not present

## 2017-11-07 DIAGNOSIS — I5040 Unspecified combined systolic (congestive) and diastolic (congestive) heart failure: Secondary | ICD-10-CM | POA: Diagnosis not present

## 2017-11-07 DIAGNOSIS — E1122 Type 2 diabetes mellitus with diabetic chronic kidney disease: Secondary | ICD-10-CM | POA: Diagnosis not present

## 2017-11-07 DIAGNOSIS — R972 Elevated prostate specific antigen [PSA]: Secondary | ICD-10-CM | POA: Diagnosis not present

## 2017-11-07 DIAGNOSIS — E039 Hypothyroidism, unspecified: Secondary | ICD-10-CM | POA: Diagnosis not present

## 2017-11-07 DIAGNOSIS — I480 Paroxysmal atrial fibrillation: Secondary | ICD-10-CM | POA: Diagnosis not present

## 2017-11-07 DIAGNOSIS — Z6829 Body mass index (BMI) 29.0-29.9, adult: Secondary | ICD-10-CM | POA: Diagnosis not present

## 2017-11-07 DIAGNOSIS — D631 Anemia in chronic kidney disease: Secondary | ICD-10-CM | POA: Diagnosis not present

## 2017-12-08 DIAGNOSIS — D631 Anemia in chronic kidney disease: Secondary | ICD-10-CM | POA: Diagnosis not present

## 2017-12-08 DIAGNOSIS — R972 Elevated prostate specific antigen [PSA]: Secondary | ICD-10-CM | POA: Diagnosis not present

## 2017-12-08 DIAGNOSIS — Z940 Kidney transplant status: Secondary | ICD-10-CM | POA: Diagnosis not present

## 2017-12-08 DIAGNOSIS — Z6829 Body mass index (BMI) 29.0-29.9, adult: Secondary | ICD-10-CM | POA: Diagnosis not present

## 2017-12-08 DIAGNOSIS — I480 Paroxysmal atrial fibrillation: Secondary | ICD-10-CM | POA: Diagnosis not present

## 2017-12-08 DIAGNOSIS — E039 Hypothyroidism, unspecified: Secondary | ICD-10-CM | POA: Diagnosis not present

## 2017-12-08 DIAGNOSIS — M109 Gout, unspecified: Secondary | ICD-10-CM | POA: Diagnosis not present

## 2017-12-08 DIAGNOSIS — E1122 Type 2 diabetes mellitus with diabetic chronic kidney disease: Secondary | ICD-10-CM | POA: Diagnosis not present

## 2017-12-08 DIAGNOSIS — E785 Hyperlipidemia, unspecified: Secondary | ICD-10-CM | POA: Diagnosis not present

## 2017-12-08 DIAGNOSIS — I5040 Unspecified combined systolic (congestive) and diastolic (congestive) heart failure: Secondary | ICD-10-CM | POA: Diagnosis not present

## 2018-01-12 ENCOUNTER — Other Ambulatory Visit: Payer: Self-pay | Admitting: Family Medicine

## 2018-01-13 ENCOUNTER — Other Ambulatory Visit: Payer: Self-pay | Admitting: Family Medicine

## 2018-01-15 DIAGNOSIS — D631 Anemia in chronic kidney disease: Secondary | ICD-10-CM | POA: Diagnosis not present

## 2018-01-15 DIAGNOSIS — E1122 Type 2 diabetes mellitus with diabetic chronic kidney disease: Secondary | ICD-10-CM | POA: Diagnosis not present

## 2018-01-15 DIAGNOSIS — I480 Paroxysmal atrial fibrillation: Secondary | ICD-10-CM | POA: Diagnosis not present

## 2018-01-15 DIAGNOSIS — R972 Elevated prostate specific antigen [PSA]: Secondary | ICD-10-CM | POA: Diagnosis not present

## 2018-01-15 DIAGNOSIS — E039 Hypothyroidism, unspecified: Secondary | ICD-10-CM | POA: Diagnosis not present

## 2018-01-15 DIAGNOSIS — Z940 Kidney transplant status: Secondary | ICD-10-CM | POA: Diagnosis not present

## 2018-01-15 DIAGNOSIS — E785 Hyperlipidemia, unspecified: Secondary | ICD-10-CM | POA: Diagnosis not present

## 2018-01-15 DIAGNOSIS — M109 Gout, unspecified: Secondary | ICD-10-CM | POA: Diagnosis not present

## 2018-01-15 DIAGNOSIS — I5040 Unspecified combined systolic (congestive) and diastolic (congestive) heart failure: Secondary | ICD-10-CM | POA: Diagnosis not present

## 2018-01-27 ENCOUNTER — Other Ambulatory Visit: Payer: Self-pay | Admitting: Family Medicine

## 2018-01-28 DIAGNOSIS — Z940 Kidney transplant status: Secondary | ICD-10-CM | POA: Diagnosis not present

## 2018-01-28 NOTE — Telephone Encounter (Signed)
Last OV 10/28/2017   Last refilled 11/04/2016 disp 241 with 1 refill   Sent to PCP for approval

## 2018-01-28 NOTE — Telephone Encounter (Signed)
Call in #[phone] with one rf

## 2018-01-30 DIAGNOSIS — E1042 Type 1 diabetes mellitus with diabetic polyneuropathy: Secondary | ICD-10-CM | POA: Diagnosis not present

## 2018-02-04 DIAGNOSIS — L821 Other seborrheic keratosis: Secondary | ICD-10-CM | POA: Diagnosis not present

## 2018-02-04 DIAGNOSIS — L820 Inflamed seborrheic keratosis: Secondary | ICD-10-CM | POA: Diagnosis not present

## 2018-02-04 DIAGNOSIS — D225 Melanocytic nevi of trunk: Secondary | ICD-10-CM | POA: Diagnosis not present

## 2018-02-04 DIAGNOSIS — L814 Other melanin hyperpigmentation: Secondary | ICD-10-CM | POA: Diagnosis not present

## 2018-02-18 DIAGNOSIS — E039 Hypothyroidism, unspecified: Secondary | ICD-10-CM | POA: Diagnosis not present

## 2018-02-18 DIAGNOSIS — R972 Elevated prostate specific antigen [PSA]: Secondary | ICD-10-CM | POA: Diagnosis not present

## 2018-02-18 DIAGNOSIS — Z940 Kidney transplant status: Secondary | ICD-10-CM | POA: Diagnosis not present

## 2018-02-18 DIAGNOSIS — I480 Paroxysmal atrial fibrillation: Secondary | ICD-10-CM | POA: Diagnosis not present

## 2018-02-18 DIAGNOSIS — E785 Hyperlipidemia, unspecified: Secondary | ICD-10-CM | POA: Diagnosis not present

## 2018-02-18 DIAGNOSIS — D631 Anemia in chronic kidney disease: Secondary | ICD-10-CM | POA: Diagnosis not present

## 2018-02-18 DIAGNOSIS — E1122 Type 2 diabetes mellitus with diabetic chronic kidney disease: Secondary | ICD-10-CM | POA: Diagnosis not present

## 2018-02-18 DIAGNOSIS — M109 Gout, unspecified: Secondary | ICD-10-CM | POA: Diagnosis not present

## 2018-03-04 DIAGNOSIS — D631 Anemia in chronic kidney disease: Secondary | ICD-10-CM | POA: Diagnosis not present

## 2018-03-04 DIAGNOSIS — Z940 Kidney transplant status: Secondary | ICD-10-CM | POA: Diagnosis not present

## 2018-03-04 DIAGNOSIS — E119 Type 2 diabetes mellitus without complications: Secondary | ICD-10-CM | POA: Diagnosis not present

## 2018-03-11 DIAGNOSIS — Z794 Long term (current) use of insulin: Secondary | ICD-10-CM | POA: Diagnosis not present

## 2018-03-11 DIAGNOSIS — E119 Type 2 diabetes mellitus without complications: Secondary | ICD-10-CM | POA: Diagnosis not present

## 2018-03-11 DIAGNOSIS — Z792 Long term (current) use of antibiotics: Secondary | ICD-10-CM | POA: Diagnosis not present

## 2018-03-11 DIAGNOSIS — Z4822 Encounter for aftercare following kidney transplant: Secondary | ICD-10-CM | POA: Diagnosis not present

## 2018-03-11 DIAGNOSIS — I4891 Unspecified atrial fibrillation: Secondary | ICD-10-CM | POA: Diagnosis not present

## 2018-03-11 DIAGNOSIS — N186 End stage renal disease: Secondary | ICD-10-CM | POA: Diagnosis not present

## 2018-03-11 DIAGNOSIS — Z79899 Other long term (current) drug therapy: Secondary | ICD-10-CM | POA: Diagnosis not present

## 2018-03-11 DIAGNOSIS — I132 Hypertensive heart and chronic kidney disease with heart failure and with stage 5 chronic kidney disease, or end stage renal disease: Secondary | ICD-10-CM | POA: Diagnosis not present

## 2018-03-11 DIAGNOSIS — E1122 Type 2 diabetes mellitus with diabetic chronic kidney disease: Secondary | ICD-10-CM | POA: Diagnosis not present

## 2018-03-11 DIAGNOSIS — Z7901 Long term (current) use of anticoagulants: Secondary | ICD-10-CM | POA: Diagnosis not present

## 2018-03-11 DIAGNOSIS — I10 Essential (primary) hypertension: Secondary | ICD-10-CM | POA: Diagnosis not present

## 2018-03-11 DIAGNOSIS — Z951 Presence of aortocoronary bypass graft: Secondary | ICD-10-CM | POA: Diagnosis not present

## 2018-03-11 DIAGNOSIS — I509 Heart failure, unspecified: Secondary | ICD-10-CM | POA: Diagnosis not present

## 2018-03-11 DIAGNOSIS — E871 Hypo-osmolality and hyponatremia: Secondary | ICD-10-CM | POA: Diagnosis not present

## 2018-03-11 DIAGNOSIS — Z940 Kidney transplant status: Secondary | ICD-10-CM | POA: Diagnosis not present

## 2018-03-11 DIAGNOSIS — R609 Edema, unspecified: Secondary | ICD-10-CM | POA: Diagnosis not present

## 2018-03-11 DIAGNOSIS — E878 Other disorders of electrolyte and fluid balance, not elsewhere classified: Secondary | ICD-10-CM | POA: Diagnosis not present

## 2018-03-11 DIAGNOSIS — D8989 Other specified disorders involving the immune mechanism, not elsewhere classified: Secondary | ICD-10-CM | POA: Diagnosis not present

## 2018-03-18 DIAGNOSIS — Z940 Kidney transplant status: Secondary | ICD-10-CM | POA: Diagnosis not present

## 2018-03-19 ENCOUNTER — Other Ambulatory Visit: Payer: Self-pay | Admitting: Family Medicine

## 2018-03-31 ENCOUNTER — Other Ambulatory Visit: Payer: Self-pay | Admitting: Family Medicine

## 2018-03-31 NOTE — Telephone Encounter (Signed)
Call in #60 with 5 rf 

## 2018-03-31 NOTE — Telephone Encounter (Signed)
prescription has been called into pt's chart

## 2018-03-31 NOTE — Telephone Encounter (Signed)
Last OV 10/28/2017   Sent to PCP to advise

## 2018-04-16 DIAGNOSIS — R972 Elevated prostate specific antigen [PSA]: Secondary | ICD-10-CM | POA: Diagnosis not present

## 2018-04-16 DIAGNOSIS — E1122 Type 2 diabetes mellitus with diabetic chronic kidney disease: Secondary | ICD-10-CM | POA: Diagnosis not present

## 2018-04-16 DIAGNOSIS — I480 Paroxysmal atrial fibrillation: Secondary | ICD-10-CM | POA: Diagnosis not present

## 2018-04-16 DIAGNOSIS — E785 Hyperlipidemia, unspecified: Secondary | ICD-10-CM | POA: Diagnosis not present

## 2018-04-16 DIAGNOSIS — M109 Gout, unspecified: Secondary | ICD-10-CM | POA: Diagnosis not present

## 2018-04-16 DIAGNOSIS — D631 Anemia in chronic kidney disease: Secondary | ICD-10-CM | POA: Diagnosis not present

## 2018-04-16 DIAGNOSIS — Z940 Kidney transplant status: Secondary | ICD-10-CM | POA: Diagnosis not present

## 2018-04-16 DIAGNOSIS — E039 Hypothyroidism, unspecified: Secondary | ICD-10-CM | POA: Diagnosis not present

## 2018-04-23 ENCOUNTER — Other Ambulatory Visit: Payer: Self-pay | Admitting: Family Medicine

## 2018-05-04 DIAGNOSIS — E1051 Type 1 diabetes mellitus with diabetic peripheral angiopathy without gangrene: Secondary | ICD-10-CM | POA: Diagnosis not present

## 2018-05-04 DIAGNOSIS — I739 Peripheral vascular disease, unspecified: Secondary | ICD-10-CM | POA: Diagnosis not present

## 2018-05-04 DIAGNOSIS — L603 Nail dystrophy: Secondary | ICD-10-CM | POA: Diagnosis not present

## 2018-05-27 ENCOUNTER — Ambulatory Visit (INDEPENDENT_AMBULATORY_CARE_PROVIDER_SITE_OTHER): Payer: Medicare Other | Admitting: Family Medicine

## 2018-05-27 ENCOUNTER — Encounter: Payer: Self-pay | Admitting: Family Medicine

## 2018-05-27 VITALS — BP 138/68 | HR 83 | Temp 98.7°F | Ht 71.0 in | Wt 235.2 lb

## 2018-05-27 DIAGNOSIS — G8929 Other chronic pain: Secondary | ICD-10-CM | POA: Diagnosis not present

## 2018-05-27 DIAGNOSIS — H5310 Unspecified subjective visual disturbances: Secondary | ICD-10-CM | POA: Diagnosis not present

## 2018-05-27 DIAGNOSIS — M545 Low back pain: Secondary | ICD-10-CM

## 2018-05-27 DIAGNOSIS — H6123 Impacted cerumen, bilateral: Secondary | ICD-10-CM

## 2018-05-27 MED ORDER — OXYCODONE HCL 5 MG PO TABS: 5.0000 mg | ORAL_TABLET | ORAL | 0 refills | Status: AC | PRN

## 2018-05-27 NOTE — Progress Notes (Signed)
   Subjective:    Patient ID: ORI TREJOS, male    DOB: 09-06-1936, 82 y.o.   MRN: [id number]  HPI Here for several issues. First he has frequent cerumen build ups in the ears and both of them are full again. Second he had transient pain above the left eye for 6 days which only appeared when he looked down toward the floor. Otherwise there was no pain. No blurred vision or double vision. Now today he has no pain at all. 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 Lastly he asks for a refill on Oxycodone for back pain (last refilled in Feb 2017).    Review of Systems  Constitutional: Negative.   HENT: Positive for hearing loss and postnasal drip. Negative for congestion, ear discharge, ear pain, sinus pressure and sinus pain.   Eyes: Positive for pain. Negative for photophobia, discharge, redness, itching and visual disturbance.  Respiratory: Negative.   Cardiovascular: Negative.   Musculoskeletal: Positive for back pain.  Neurological: Negative.        Objective:   Physical Exam  Constitutional: He is oriented to person, place, and time. He appears well-developed and well-nourished.  HENT:  Nose: Nose normal.  Mouth/Throat: Oropharynx is clear and moist.  Both ear canals are full of cerumen   Eyes: Pupils are equal, round, and reactive to light. Conjunctivae are normal.  Neck: No thyromegaly present.  Cardiovascular: Normal rate, regular rhythm, normal heart sounds and intact distal pulses.  Pulmonary/Chest: Effort normal and breath sounds normal.  Lymphadenopathy:    He has no cervical adenopathy.  Neurological: He is alert and oriented to person, place, and time.          Assessment & Plan:  Both ears have cerumen impactions and these were irrigated clear with water. Oxycodone was refilled for his back pain. The eye pain is most consistent with a strain of an extraocular muscle, which has resolved. I  advised him to take frequent breaks from his computer work to rest his eyes.  Alysia Penna, MD

## 2018-06-18 DIAGNOSIS — Z940 Kidney transplant status: Secondary | ICD-10-CM | POA: Diagnosis not present

## 2018-06-18 DIAGNOSIS — E039 Hypothyroidism, unspecified: Secondary | ICD-10-CM | POA: Diagnosis not present

## 2018-06-18 DIAGNOSIS — M109 Gout, unspecified: Secondary | ICD-10-CM | POA: Diagnosis not present

## 2018-06-18 DIAGNOSIS — D631 Anemia in chronic kidney disease: Secondary | ICD-10-CM | POA: Diagnosis not present

## 2018-06-18 DIAGNOSIS — E785 Hyperlipidemia, unspecified: Secondary | ICD-10-CM | POA: Diagnosis not present

## 2018-06-18 DIAGNOSIS — R972 Elevated prostate specific antigen [PSA]: Secondary | ICD-10-CM | POA: Diagnosis not present

## 2018-06-18 DIAGNOSIS — E1122 Type 2 diabetes mellitus with diabetic chronic kidney disease: Secondary | ICD-10-CM | POA: Diagnosis not present

## 2018-06-18 DIAGNOSIS — I5022 Chronic systolic (congestive) heart failure: Secondary | ICD-10-CM | POA: Diagnosis not present

## 2018-06-18 DIAGNOSIS — I480 Paroxysmal atrial fibrillation: Secondary | ICD-10-CM | POA: Diagnosis not present

## 2018-06-30 ENCOUNTER — Other Ambulatory Visit: Payer: Self-pay | Admitting: Family Medicine

## 2018-07-04 ENCOUNTER — Other Ambulatory Visit (HOSPITAL_COMMUNITY): Payer: Self-pay | Admitting: Internal Medicine

## 2018-07-21 DIAGNOSIS — Z23 Encounter for immunization: Secondary | ICD-10-CM | POA: Diagnosis not present

## 2018-08-06 DIAGNOSIS — L821 Other seborrheic keratosis: Secondary | ICD-10-CM | POA: Diagnosis not present

## 2018-08-06 DIAGNOSIS — Z85828 Personal history of other malignant neoplasm of skin: Secondary | ICD-10-CM | POA: Diagnosis not present

## 2018-08-06 DIAGNOSIS — D692 Other nonthrombocytopenic purpura: Secondary | ICD-10-CM | POA: Diagnosis not present

## 2018-08-10 DIAGNOSIS — E1042 Type 1 diabetes mellitus with diabetic polyneuropathy: Secondary | ICD-10-CM | POA: Diagnosis not present

## 2018-08-19 DIAGNOSIS — D631 Anemia in chronic kidney disease: Secondary | ICD-10-CM | POA: Diagnosis not present

## 2018-08-19 DIAGNOSIS — I5022 Chronic systolic (congestive) heart failure: Secondary | ICD-10-CM | POA: Diagnosis not present

## 2018-08-19 DIAGNOSIS — E119 Type 2 diabetes mellitus without complications: Secondary | ICD-10-CM | POA: Diagnosis not present

## 2018-08-19 DIAGNOSIS — E785 Hyperlipidemia, unspecified: Secondary | ICD-10-CM | POA: Diagnosis not present

## 2018-08-19 DIAGNOSIS — N189 Chronic kidney disease, unspecified: Secondary | ICD-10-CM | POA: Diagnosis not present

## 2018-08-19 DIAGNOSIS — I480 Paroxysmal atrial fibrillation: Secondary | ICD-10-CM | POA: Diagnosis not present

## 2018-08-19 DIAGNOSIS — E039 Hypothyroidism, unspecified: Secondary | ICD-10-CM | POA: Diagnosis not present

## 2018-08-19 DIAGNOSIS — Z940 Kidney transplant status: Secondary | ICD-10-CM | POA: Diagnosis not present

## 2018-08-19 DIAGNOSIS — M109 Gout, unspecified: Secondary | ICD-10-CM | POA: Diagnosis not present

## 2018-09-01 DIAGNOSIS — Z940 Kidney transplant status: Secondary | ICD-10-CM | POA: Diagnosis not present

## 2018-09-01 DIAGNOSIS — E875 Hyperkalemia: Secondary | ICD-10-CM | POA: Diagnosis not present

## 2018-09-10 ENCOUNTER — Other Ambulatory Visit: Payer: Self-pay | Admitting: Internal Medicine

## 2018-09-10 DIAGNOSIS — I4891 Unspecified atrial fibrillation: Secondary | ICD-10-CM

## 2018-09-26 ENCOUNTER — Other Ambulatory Visit: Payer: Self-pay | Admitting: Family Medicine

## 2018-09-30 NOTE — Telephone Encounter (Signed)
Patient calling to find out if his medication will be approved on this 3rd business day?

## 2018-10-01 NOTE — Telephone Encounter (Signed)
Pt called again to find out the status of refill request of temazepam originally put in on 09/26/18. Please advise

## 2018-10-02 MED ORDER — TEMAZEPAM 30 MG PO CAPS: 30.0000 mg | ORAL_CAPSULE | Freq: Every evening | ORAL | 1 refills | Status: DC | PRN

## 2018-10-02 NOTE — Telephone Encounter (Signed)
Cancel Temazepam 15 mg. Call in Temazepam 30 mg qhs, #90 with one rf

## 2018-10-02 NOTE — Telephone Encounter (Signed)
Called the pharmacy and left on the VM to stop the refills of the temazepam 15 mg  And to change this to 30 mg  #90 with 1 refill.  Nothing further is needed.

## 2018-10-06 ENCOUNTER — Other Ambulatory Visit (HOSPITAL_COMMUNITY): Payer: Self-pay | Admitting: Cardiology

## 2018-10-12 ENCOUNTER — Encounter: Payer: Self-pay | Admitting: Family Medicine

## 2018-10-12 ENCOUNTER — Ambulatory Visit (INDEPENDENT_AMBULATORY_CARE_PROVIDER_SITE_OTHER): Payer: Medicare Other

## 2018-10-12 ENCOUNTER — Ambulatory Visit (INDEPENDENT_AMBULATORY_CARE_PROVIDER_SITE_OTHER): Payer: Medicare Other | Admitting: Family Medicine

## 2018-10-12 VITALS — BP 124/70 | HR 75 | Temp 98.2°F | Wt 242.0 lb

## 2018-10-12 DIAGNOSIS — M25551 Pain in right hip: Secondary | ICD-10-CM

## 2018-10-12 DIAGNOSIS — M160 Bilateral primary osteoarthritis of hip: Secondary | ICD-10-CM | POA: Diagnosis not present

## 2018-10-12 MED ORDER — TRAMADOL HCL 50 MG PO TABS: 100.0000 mg | ORAL_TABLET | Freq: Four times a day (QID) | ORAL | 1 refills | Status: DC | PRN

## 2018-10-12 NOTE — Progress Notes (Signed)
   Subjective:    Patient ID: LATHANIEL LEGATE, male    DOB: 1936/08/09, 82 y.o.   MRN: [id number]  HPI Here for 3 weeks of pain in the anterior right hip and now one week of pain in the right knee. No swelling. No recent trauma. He is using a cane at times and taking Tramadol. No back pain.     Review of Systems  Constitutional: Negative.   Respiratory: Negative.   Cardiovascular: Negative.   Musculoskeletal: Positive for arthralgias.       Objective:   Physical Exam Constitutional:      Comments: Limping a bit   Cardiovascular:     Rate and Rhythm: Normal rate and regular rhythm.     Pulses: Normal pulses.     Heart sounds: Normal heart sounds.  Pulmonary:     Effort: Pulmonary effort is normal.     Breath sounds: Normal breath sounds.  Musculoskeletal:     Comments: The right hip has full ROM but there is pain with internal and external rotation, no tenderness. The right knee is normal on exam   Neurological:     Mental Status: He is alert.           Assessment & Plan:  Right hip pain, likely from arthritis. We will get Xrays of the hips today. The knee pain is likely the result of an altered gait. Use Tramadol prn. Alysia Penna, MD

## 2018-10-15 ENCOUNTER — Encounter: Payer: Self-pay | Admitting: *Deleted

## 2018-10-15 NOTE — Addendum Note (Signed)
Addended by: Alysia Penna A on: 10/15/2018 03:24 PM   Modules accepted: Orders

## 2018-10-16 ENCOUNTER — Telehealth: Payer: Self-pay | Admitting: *Deleted

## 2018-10-16 DIAGNOSIS — M25551 Pain in right hip: Secondary | ICD-10-CM

## 2018-10-16 NOTE — Telephone Encounter (Signed)
Hilda Blades, can you please change the Orthopedics referral from [place] to someone in [place]? Thanks

## 2018-10-16 NOTE — Telephone Encounter (Signed)
Copied from [place] [phone] [phone]. Topic: General - Other >> Oct 16, 2018  8:54 AM Alanda Slim E wrote: Reason for CRM: Pt called in for results to his xray and stated he received a call from orthopedics but did not schedule an appt with them because he wasn't made aware of if anything was wrong with his results. Pt asked for Marlowe Kays to call him.   My chart message was sent to the pt and he is aware of ortho appt referral.

## 2018-10-16 NOTE — Telephone Encounter (Signed)
Dr. Sarajane Jews   The pt got a call from ortho but did not schedule the appt  He is requesting that the referral be sent to ortho in [place]---so he does not have to drive so far for the appointment.  Please advise. Thanks

## 2018-10-23 ENCOUNTER — Other Ambulatory Visit: Payer: Self-pay | Admitting: Family Medicine

## 2018-10-23 MED ORDER — INSULIN PEN NEEDLE 31G X 8 MM MISC: 1 refills | Status: DC

## 2018-10-23 NOTE — Telephone Encounter (Signed)
Copied from [place] [phone]. Topic: Quick Communication - Rx Refill/Question >> Oct 23, 2018  4:40 PM Windy Kalata wrote: Medication: Insulin Pen Needle (B-D ULTRAFINE III SHORT PEN) 31G X 8 MM MISC  Has the patient contacted their pharmacy? Yes.   (Agent: If no, request that the patient contact the pharmacy for the refill.) (Agent: If yes, when and what did the pharmacy advise?) [facility] is calling checking status of the refill   Preferred Pharmacy (with phone number or street name):  Agent: Please be advised that RX refills may take up to 3 business days. We ask that you follow-up with your pharmacy.

## 2018-10-27 DIAGNOSIS — I5022 Chronic systolic (congestive) heart failure: Secondary | ICD-10-CM | POA: Diagnosis not present

## 2018-10-27 DIAGNOSIS — E119 Type 2 diabetes mellitus without complications: Secondary | ICD-10-CM | POA: Diagnosis not present

## 2018-10-27 DIAGNOSIS — I480 Paroxysmal atrial fibrillation: Secondary | ICD-10-CM | POA: Diagnosis not present

## 2018-10-27 DIAGNOSIS — E785 Hyperlipidemia, unspecified: Secondary | ICD-10-CM | POA: Diagnosis not present

## 2018-10-27 DIAGNOSIS — E039 Hypothyroidism, unspecified: Secondary | ICD-10-CM | POA: Diagnosis not present

## 2018-10-27 DIAGNOSIS — N189 Chronic kidney disease, unspecified: Secondary | ICD-10-CM | POA: Diagnosis not present

## 2018-10-27 DIAGNOSIS — Z940 Kidney transplant status: Secondary | ICD-10-CM | POA: Diagnosis not present

## 2018-10-27 DIAGNOSIS — M109 Gout, unspecified: Secondary | ICD-10-CM | POA: Diagnosis not present

## 2018-10-27 DIAGNOSIS — M160 Bilateral primary osteoarthritis of hip: Secondary | ICD-10-CM | POA: Diagnosis not present

## 2018-10-27 DIAGNOSIS — D631 Anemia in chronic kidney disease: Secondary | ICD-10-CM | POA: Diagnosis not present

## 2018-10-27 NOTE — Telephone Encounter (Signed)
Patient called in and stated he would like yo know information on and ortho in [place] and what is the status. Please call pat  CB# [phone]

## 2018-10-27 NOTE — Telephone Encounter (Signed)
The last referral was placed but pt then wanted to see someone in [place] where he lives.  New referral placed today for this.  Pt is aware.

## 2018-10-27 NOTE — Addendum Note (Signed)
Addended by: Elie Confer on: 10/27/2018 03:19 PM   Modules accepted: Orders

## 2018-10-28 ENCOUNTER — Other Ambulatory Visit (HOSPITAL_COMMUNITY): Payer: Self-pay | Admitting: Internal Medicine

## 2018-10-29 DIAGNOSIS — M25551 Pain in right hip: Secondary | ICD-10-CM | POA: Diagnosis not present

## 2018-11-06 ENCOUNTER — Other Ambulatory Visit (HOSPITAL_COMMUNITY): Payer: Self-pay

## 2018-11-09 ENCOUNTER — Ambulatory Visit (HOSPITAL_COMMUNITY)
Admission: RE | Admit: 2018-11-09 | Discharge: 2018-11-09 | Disposition: A | Discharge status: Home / Self Care | Payer: Medicare Other | Source: Ambulatory Visit | Attending: Nephrology | Admitting: Nephrology

## 2018-11-09 DIAGNOSIS — D631 Anemia in chronic kidney disease: Secondary | ICD-10-CM | POA: Diagnosis not present

## 2018-11-09 MED ORDER — SODIUM CHLORIDE 0.9 % IV SOLN
510.0000 mg | Freq: Once | INTRAVENOUS | Status: AC
  Administered 2018-11-09: 510 mg via INTRAVENOUS
  Filled 2018-11-09: qty 510

## 2018-11-10 DIAGNOSIS — E1051 Type 1 diabetes mellitus with diabetic peripheral angiopathy without gangrene: Secondary | ICD-10-CM | POA: Diagnosis not present

## 2018-11-10 DIAGNOSIS — L603 Nail dystrophy: Secondary | ICD-10-CM | POA: Diagnosis not present

## 2018-11-10 DIAGNOSIS — I739 Peripheral vascular disease, unspecified: Secondary | ICD-10-CM | POA: Diagnosis not present

## 2018-11-13 DIAGNOSIS — M25551 Pain in right hip: Secondary | ICD-10-CM | POA: Diagnosis not present

## 2018-11-19 ENCOUNTER — Other Ambulatory Visit: Payer: Self-pay | Admitting: Family Medicine

## 2018-11-19 MED ORDER — GLUCOSE BLOOD VI STRP: ORAL_STRIP | 1 refills | Status: DC

## 2018-11-19 NOTE — Telephone Encounter (Signed)
Copied from [place] [phone]. Topic: Quick Communication - Rx Refill/Question >> Nov 19, 2018 12:01 PM [name] Brown wrote: Medication: ONE TOUCH ULTRA TEST test strip [[id number]  Has the patient contacted their pharmacy? Yes  (Agent: If no, request that the patient contact the pharmacy for the refill.) (Agent: If yes, when and what did the pharmacy advise?)  Preferred Pharmacy (with phone number or street name):[id number] [place], [address] AT [facility] [phone] (Phone)  Agent: Please be advised that RX refills may take up to 3 business days. We ask that you follow-up with your pharmacy.

## 2018-11-20 DIAGNOSIS — Y998 Other external cause status: Secondary | ICD-10-CM | POA: Diagnosis not present

## 2018-11-20 DIAGNOSIS — X58XXXA Exposure to other specified factors, initial encounter: Secondary | ICD-10-CM | POA: Diagnosis not present

## 2018-11-20 DIAGNOSIS — E11649 Type 2 diabetes mellitus with hypoglycemia without coma: Secondary | ICD-10-CM | POA: Diagnosis not present

## 2018-11-20 DIAGNOSIS — D649 Anemia, unspecified: Secondary | ICD-10-CM | POA: Diagnosis not present

## 2018-11-20 DIAGNOSIS — Z794 Long term (current) use of insulin: Secondary | ICD-10-CM | POA: Diagnosis not present

## 2018-11-20 DIAGNOSIS — T383X1A Poisoning by insulin and oral hypoglycemic [antidiabetic] drugs, accidental (unintentional), initial encounter: Secondary | ICD-10-CM | POA: Diagnosis not present

## 2018-11-20 DIAGNOSIS — E119 Type 2 diabetes mellitus without complications: Secondary | ICD-10-CM | POA: Diagnosis not present

## 2018-11-21 MED ORDER — DEXTROSE 10 % IV SOLN
250.00 | INTRAVENOUS | Status: DC
Start: ? — End: 2018-11-21

## 2018-11-21 MED ORDER — ACETAMINOPHEN 325 MG PO TABS
650.00 | ORAL_TABLET | ORAL | Status: DC
Start: ? — End: 2018-11-21

## 2018-11-26 ENCOUNTER — Other Ambulatory Visit (HOSPITAL_COMMUNITY): Payer: Self-pay | Admitting: Internal Medicine

## 2018-11-26 MED ORDER — AMIODARONE HCL 200 MG PO TABS: 200.0000 mg | ORAL_TABLET | Freq: Every day | ORAL | 1 refills | Status: DC

## 2018-11-26 NOTE — Telephone Encounter (Signed)
Pt needs appt for refills [phone]

## 2018-12-21 ENCOUNTER — Telehealth: Payer: Self-pay | Admitting: Family Medicine

## 2018-12-21 MED ORDER — GLUCOSE BLOOD VI STRP: ORAL_STRIP | 11 refills | Status: DC

## 2018-12-21 NOTE — Telephone Encounter (Signed)
Copied from [place] [phone]. Topic: Quick Communication - Rx Refill/Question >> Dec 21, 2018 10:33 AM Alanda Slim E wrote: Medication: glucose blood (ONE TOUCH ULTRA TEST) test strip - a new scipt needs to be sent to the pharmacy with a sig of TEST 3 TIMES A DAY for medicare part B coverage. Can not say 1-3/ please advise   Has the patient contacted their pharmacy? Yes - pharmacy called   Preferred Pharmacy (with phone number or street name): [id number] [place], [address] AT [facility] [phone] (Phone) [phone] (Fax)    Agent: Please be advised that RX refills may take up to 3 business days. We ask that you follow-up with your pharmacy.

## 2018-12-24 DIAGNOSIS — M109 Gout, unspecified: Secondary | ICD-10-CM | POA: Diagnosis not present

## 2018-12-24 DIAGNOSIS — E119 Type 2 diabetes mellitus without complications: Secondary | ICD-10-CM | POA: Diagnosis not present

## 2018-12-24 DIAGNOSIS — I480 Paroxysmal atrial fibrillation: Secondary | ICD-10-CM | POA: Diagnosis not present

## 2018-12-24 DIAGNOSIS — Z940 Kidney transplant status: Secondary | ICD-10-CM | POA: Diagnosis not present

## 2018-12-24 DIAGNOSIS — I5022 Chronic systolic (congestive) heart failure: Secondary | ICD-10-CM | POA: Diagnosis not present

## 2018-12-24 DIAGNOSIS — E039 Hypothyroidism, unspecified: Secondary | ICD-10-CM | POA: Diagnosis not present

## 2018-12-24 DIAGNOSIS — N189 Chronic kidney disease, unspecified: Secondary | ICD-10-CM | POA: Diagnosis not present

## 2018-12-24 DIAGNOSIS — D631 Anemia in chronic kidney disease: Secondary | ICD-10-CM | POA: Diagnosis not present

## 2018-12-24 DIAGNOSIS — M160 Bilateral primary osteoarthritis of hip: Secondary | ICD-10-CM | POA: Diagnosis not present

## 2018-12-24 DIAGNOSIS — E785 Hyperlipidemia, unspecified: Secondary | ICD-10-CM | POA: Diagnosis not present

## 2018-12-25 ENCOUNTER — Other Ambulatory Visit (HOSPITAL_COMMUNITY): Payer: Self-pay | Admitting: Internal Medicine

## 2018-12-28 ENCOUNTER — Encounter (HOSPITAL_COMMUNITY): Payer: Medicare Other | Admitting: Internal Medicine

## 2019-01-01 ENCOUNTER — Other Ambulatory Visit (HOSPITAL_COMMUNITY): Payer: Self-pay | Admitting: *Deleted

## 2019-01-01 MED ORDER — ALLOPURINOL 300 MG PO TABS: 300.0000 mg | ORAL_TABLET | Freq: Every day | ORAL | 0 refills | Status: DC

## 2019-01-06 ENCOUNTER — Encounter (HOSPITAL_COMMUNITY): Payer: Medicare Other | Admitting: Internal Medicine

## 2019-01-12 ENCOUNTER — Other Ambulatory Visit: Payer: Self-pay | Admitting: Family Medicine

## 2019-01-12 MED ORDER — INSULIN GLARGINE 100 UNIT/ML SOLOSTAR PEN: PEN_INJECTOR | SUBCUTANEOUS | 5 refills | Status: DC

## 2019-01-18 ENCOUNTER — Telehealth: Payer: Self-pay | Admitting: *Deleted

## 2019-01-18 NOTE — Telephone Encounter (Signed)
Copied from [place] [phone]. Topic: General - Other >> Jan 18, 2019  9:23 AM Carolyn Stare wrote: People would like to have his ears cleaned out and is asking when he may be able to come in and have this done

## 2019-01-19 ENCOUNTER — Encounter: Payer: Self-pay | Admitting: *Deleted

## 2019-01-26 ENCOUNTER — Other Ambulatory Visit (HOSPITAL_COMMUNITY): Payer: Self-pay | Admitting: Internal Medicine

## 2019-01-27 ENCOUNTER — Other Ambulatory Visit: Payer: Self-pay | Admitting: Family Medicine

## 2019-02-23 ENCOUNTER — Other Ambulatory Visit (HOSPITAL_COMMUNITY): Payer: Self-pay | Admitting: Internal Medicine

## 2019-02-24 ENCOUNTER — Ambulatory Visit (HOSPITAL_COMMUNITY)
Admission: RE | Admit: 2019-02-24 | Discharge: 2019-02-24 | Disposition: A | Discharge status: Home / Self Care | Payer: Medicare Other | Source: Ambulatory Visit | Attending: Internal Medicine | Admitting: Internal Medicine

## 2019-02-24 ENCOUNTER — Encounter (HOSPITAL_COMMUNITY): Payer: Self-pay | Admitting: *Deleted

## 2019-02-24 ENCOUNTER — Other Ambulatory Visit: Payer: Self-pay

## 2019-02-24 DIAGNOSIS — I480 Paroxysmal atrial fibrillation: Secondary | ICD-10-CM

## 2019-02-24 DIAGNOSIS — N183 Chronic kidney disease, stage 3 unspecified: Secondary | ICD-10-CM

## 2019-02-24 DIAGNOSIS — I5032 Chronic diastolic (congestive) heart failure: Secondary | ICD-10-CM

## 2019-02-24 MED ORDER — APIXABAN 2.5 MG PO TABS: 2.5000 mg | ORAL_TABLET | Freq: Two times a day (BID) | ORAL | 11 refills | Status: DC

## 2019-02-24 NOTE — Patient Instructions (Signed)
Decrease Eliquis to 2.5 mg Twice daily   A refill for your Amiodarone and new Eliquis [facility]  Please call our office in September to schedule your follow up appointment and echocardiogram from November.  If you have any questions or concerns before your next appointment please send [place] a message through [place] or call our office at [phone].

## 2019-02-24 NOTE — Addendum Note (Signed)
Encounter addended by: Scarlette Calico, RN on: 02/24/2019 11:50 AM  Actions taken: Pharmacy for encounter modified, Order list changed, Diagnosis association updated, Clinical Note Signed

## 2019-02-24 NOTE — Progress Notes (Signed)
Heart Failure TeleHealth Note  Due to national recommendations of social distancing due to [place] 19, Audio/video telehealth visit is felt to be most appropriate for this patient at this time.  See MyChart message from today for patient consent regarding telehealth for [facility].  Date:  02/24/2019   ID:  [name] Brown, DOB Mar 10, 1936, MRN [id number]  Location: Home  Provider location: [facility] Type of Visit: Established patient  PCP:  Laurey Morale, MD  Cardiologist:  No primary care provider on file. Primary HF: Dj Senteno  Chief Complaint: Heart Failure follow-up   History of Present Illness:  [name] Brown is a 83 y.o. male with diabetes, HTN, ESRD (secondary to DM2 & ATN after CABG /bioprosthetic aortic valve replacement in 2011), CAD and PAF. He is s/p kidney transplant on 02/17/2012. He is the father of [name] Brown (one of our ICU RNs)  As above, he underwent bioprosthetic AVR/CABG in 2011. Complicated by progressive renal failure and was placed on HD. Was on HD for 2 years via RUE AVF. Underwent kidney transplant in 4/13 at [facility] and did well.   Unfortunately, developed AF in 2/18 and creatinine jumped from 1.5->2.0. Underwent DC-CV but did not hold.In May 2018 was admitted [facility] for diastiolic HF with question of cardiorenal syndrome. Creatine peaked close to 4.. Echo during that admit showed normal EF with Pulmonary HTN and RV dysfunction. During that admit also developed RP bleed and Eliquis dose subsequently reduced.  We repeated DC-CV in 7/18 with amio support and he has maintianed NSR.   He presents via Engineer, civil (consulting) for a telehealth visit today. Feeling fine. Remains in NSR. Saw Dr. Lorrene Reid in 320 and creatinine stable  Weight s down to  224 pounds. Vitals today   BP 129/77 HR 84  Says he is inactive as he needs hip and knee replacement  Denies CP,edema, orthopnea or PND. Remains on Eliquis. No bleeding.  Studies:  Echo 03/20/17 Left ventricular systolic function is normal.  LV ejection fraction = 55-60%.  The right ventricle is moderately dilated.  The right ventricular systolic function is mildly reduced.  The left atrium is mildly dilated.  The right atrium is mildly dilated.  There is a bioprosthetic aortic valve.  There is moderate to severe mitral annular calcification.  There is moderate tricuspid regurgitation.  Estimated right ventricular systolic pressure is 75 mmHg.  Estimated right atrial pressure is 10 mmHg..  Moderate to severe pulmonary hypertension.  There is no pericardial effusion.  Probably no significant change in comparison with the prior study noted   RHC 7/18 RA = 16 RV = 59/12 PA = 64/24 (39) PCW = 22 (v- waves to 35) Fick cardiac output/index = 8.1/3.7 Thermo CO/CI = 7.4/3.4 PVR = 2.1 WU Ao sat = 99% PA sat = 72%, 73% SVC sat = 80%   [name] Brown denies symptoms worrisome for COVID 19.   Past Medical History:  Diagnosis Date  . Anemia 01/02/2012  . Anemia associated with chronic renal failure   . Aortic stenosis   . Atrial fibrillation ([place])   . CAD (coronary artery disease)   . CHF (congestive heart failure) ([place])    sees Dr. Glori Bickers   . Chronic kidney disease     sees Dr. Jamal Maes   . CVA (cerebral infarction) 05/2010  . Diabetes mellitus   . Gout   . Hyperlipidemia   . Hypertension   . Myocardial infarction ([place])   .  Obesity   . Shoulder pain, left    sees Dr. Nickola Major at [facility] in [place]   . Splenomegaly 2012  . Thrombocytopenia ([place]) 2011   sees Dr. Lamonte Sakai  . Thrombocytopenia ([place])    Past Surgical History:  Procedure Laterality Date  . AORTIC VALVE REPLACEMENT     Pericardial tissue valve  . APPENDECTOMY    . bone spurs     from right heel  . CARDIOVERSION N/A 01/13/2017   Procedure: CARDIOVERSION;  Surgeon: Larey Dresser, MD;  Location: [place];  Service: Cardiovascular;   Laterality: N/A;  . CARDIOVERSION N/A 05/01/2017   Procedure: CARDIOVERSION;  Surgeon: Jolaine Artist, MD;  Location: [facility] ENDOSCOPY;  Service: Cardiovascular;  Laterality: N/A;  . CORONARY ARTERY BYPASS GRAFT     June 2 011 Limited the LAD, SVG to OM, SVG to PDA  . KIDNEY TRANSPLANT  01-20-12   at [facility] N/A 04/28/2017   Procedure: Right Heart Cath;  Surgeon: Jolaine Artist, MD;  Location: [place] CV LAB;  Service: Cardiovascular;  Laterality: N/A;  . TEE WITHOUT CARDIOVERSION N/A 05/01/2017   Procedure: TRANSESOPHAGEAL ECHOCARDIOGRAM (TEE);  Surgeon: Jolaine Artist, MD;  Location: [facility] ENDOSCOPY;  Service: Cardiovascular;  Laterality: N/A;     Current Outpatient Medications  Medication Sig Dispense Refill  . allopurinol (ZYLOPRIM) 300 MG tablet Take 1 tablet (300 mg total) by mouth daily. 30 tablet 0  . ALPRAZolam (XANAX) 0.5 MG tablet Take 1 tablet (0.5 mg total) by mouth every 6 (six) hours as needed for anxiety. 120 tablet 2  . amiodarone (PACERONE) 200 MG tablet TAKE 1 TABLET BY MOUTH EVERY DAY 30 tablet 0  . aspirin 81 MG tablet Take 81 mg by mouth daily.    . calcium carbonate (TUMS - DOSED IN MG ELEMENTAL CALCIUM) 500 MG chewable tablet Chew 500-1,000 mg by mouth daily after lunch. 1-2 depends on if feeling more gassy will take 2    . diltiazem (CARDIZEM CD) 120 MG 24 hr capsule TAKE 1 CAPSULE BY MOUTH EVERY DAY 90 capsule 3  . DiphenhydrAMINE HCl (ZZZQUIL) 50 MG/30ML LIQD Take by mouth.    [name] Brown 5 MG TABS tablet TAKE 1 TABLET BY MOUTH TWICE A DAY 180 tablet 2  . furosemide (LASIX) 40 MG tablet Take 40 mg by mouth 2 (two) times daily.    Marland Kitchen glucose blood (ONE TOUCH ULTRA TEST) test strip TEST 3 TIMES PER DAY AND DIAGNOSIS CODE IS E 11.9 100 each 11  . Insulin Glargine (LANTUS SOLOSTAR) 100 UNIT/ML Solostar Pen INJECT 30 UNITS AT BEDTIME 15 mL 5  . Insulin Pen Needle (B-D ULTRAFINE III SHORT PEN) 31G X 8 MM MISC USE 3-4 TIMES PER DAY 100  each 1  . Krill Oil 1000 MG CAPS Take 1,000 mg by mouth daily at 3 pm.     . levothyroxine (SYNTHROID, LEVOTHROID) 25 MCG tablet Take 25 mcg by mouth every evening.     . mycophenolate (MYFORTIC) 180 MG EC tablet Take 1 tablet (180 mg total) by mouth 2 (two) times daily. 2 tablet 0  . NOVOLOG FLEXPEN 100 UNIT/ML FlexPen INJECT 2 TO 8 UNITS PER SLIDING SCALE THREE TIMES DAILY WITH MEALS 15 mL 3  . omeprazole (PRILOSEC) 20 MG capsule Take 20 mg by mouth at bedtime.     . ondansetron (ZOFRAN) 4 MG tablet Take 1 tablet (4 mg total) by mouth every 8 (eight) hours as needed for  nausea or vomiting. 20 tablet 2  . ONE TOUCH ULTRA TEST test strip TEST 1-3 TIMES PER DAY AND DIAGNOSIS CODE IS E 11.9 100 each 0  . polyethylene glycol powder (GLYCOLAX/MIRALAX) powder Take 17 g by mouth daily.  0  . simvastatin (ZOCOR) 10 MG tablet Take 1 tablet (10 mg total) by mouth at bedtime. (Patient taking differently: Take 10 mg by mouth daily at 3 pm. ) 1 tablet 0  . sulfamethoxazole-trimethoprim (BACTRIM,SEPTRA) 400-80 MG tablet Take 1 tablet by mouth every Monday, Wednesday, and Friday. Takes for kidney rejection  5  . tacrolimus (PROGRAF) 0.5 MG capsule Take 0.5 mg by mouth daily.    . tacrolimus (PROGRAF) 1 MG capsule Take 1 mg by mouth daily.    . temazepam (RESTORIL) 30 MG capsule Take 1 capsule (30 mg total) by mouth at bedtime as needed. 90 capsule 1  . traMADol (ULTRAM) 50 MG tablet Take 2 tablets (100 mg total) by mouth every 6 (six) hours as needed. 240 tablet 1   No current facility-administered medications for this encounter.     Allergies:   Penicillins   Social History:  The patient  reports that he has never smoked. He has never used smokeless tobacco. He reports that he does not drink alcohol or use drugs.   Family History:  The patient's family history includes Cancer in his unknown relative; Coronary artery disease in his unknown relative; Hypertension in his unknown relative; Liver disease in his  father.   ROS:  Please see the history of present illness.   All other systems are personally reviewed and negative.   Exam:  (Video/Tele Health Call; Exam is subjective and or/visual.) General:  Speaks in full sentences. No resp difficulty. Lungs: Normal respiratory effort with conversation.  Abdomen: Non-distended per patient report Extremities: Pt denies edema. Neuro: Alert & oriented x 3.   Recent Labs: No results found for requested labs within last 8760 hours.  Personally reviewed   Wt Readings from Last 3 Encounters:  11/09/18 106.1 kg (234 lb)  10/12/18 109.8 kg (242 lb)  05/27/18 106.7 kg (235 lb 3.2 oz)      ASSESSMENT AND PLAN:  ASSESSMENT & PLAN:  1. Chronic diastolic/RV heart failure - Cotninues to do great now that he is back in NSR  - Volume status stable - Diuretics managed by Dr. Lorrene Reid - Continue Coreg 6.25 mg BID - No spiro with CKD.   2. Paroxysmal AF - CHADSVASc = 5 - With age 82 and creatinine > 1.5 will decrease dose to 2.5 bid  - Continue Amio 200 mg daily and diltiazem 120 mg daily.  - He cannot tolerate atrial fib if he has recurrent AF will proceed with repeat DC-CV  3. CAD - No s/s of ischemia.   4. AVR - Stable by last Echo 7/18 - Aware of SBE prophylaxis  5. ESRD s/p renal transplant in 2013. Now CKD 3 - Followed by Dr. Lorrene Reid.  6. Possible OSA - Sleep study 06/25/17. He says that even if he needs a CPAP he will not be able to tolerate anything on his face.   COVID screen The patient does not have any symptoms that suggest any further testing/ screening at this time.  Social distancing reinforced today.  Recommended follow-up:  As above  Relevant cardiac medications were reviewed at length with the patient today.   The patient does not have concerns regarding their medications at this time.   The following changes were made  today:  As above  Today, I have spent 17 minutes with the patient with telehealth technology  discussing the above issues .    Signed, Glori Bickers, MD  02/24/2019 11:09 AM  Advanced [facility] Failure [address] [place] [address] [phone] (office) [phone] (fax)

## 2019-02-24 NOTE — Progress Notes (Signed)
AVS sent to pt via mychart. 

## 2019-03-01 ENCOUNTER — Ambulatory Visit: Payer: Medicare Other | Admitting: Family Medicine

## 2019-03-01 ENCOUNTER — Ambulatory Visit (INDEPENDENT_AMBULATORY_CARE_PROVIDER_SITE_OTHER): Payer: Medicare Other | Admitting: Family Medicine

## 2019-03-01 ENCOUNTER — Other Ambulatory Visit: Payer: Self-pay

## 2019-03-01 ENCOUNTER — Encounter: Payer: Self-pay | Admitting: Family Medicine

## 2019-03-01 ENCOUNTER — Ambulatory Visit (INDEPENDENT_AMBULATORY_CARE_PROVIDER_SITE_OTHER): Payer: Medicare Other

## 2019-03-01 VITALS — BP 118/76 | HR 79 | Temp 98.0°F

## 2019-03-01 DIAGNOSIS — H6122 Impacted cerumen, left ear: Secondary | ICD-10-CM

## 2019-03-01 DIAGNOSIS — I509 Heart failure, unspecified: Secondary | ICD-10-CM | POA: Diagnosis not present

## 2019-03-01 DIAGNOSIS — I480 Paroxysmal atrial fibrillation: Secondary | ICD-10-CM | POA: Diagnosis not present

## 2019-03-01 DIAGNOSIS — I10 Essential (primary) hypertension: Secondary | ICD-10-CM | POA: Diagnosis not present

## 2019-03-01 DIAGNOSIS — S8392XA Sprain of unspecified site of left knee, initial encounter: Secondary | ICD-10-CM

## 2019-03-01 DIAGNOSIS — M25462 Effusion, left knee: Secondary | ICD-10-CM | POA: Diagnosis not present

## 2019-03-01 MED ORDER — HYDROCODONE-ACETAMINOPHEN 5-325 MG PO TABS: 1.0000 | ORAL_TABLET | ORAL | 0 refills | Status: DC | PRN

## 2019-03-01 NOTE — Progress Notes (Signed)
   Subjective:    Patient ID: [name] Brown, male    DOB: April 02, 1936, 83 y.o.   MRN: [id number]  HPI Here for several issues. First he injured his left knee last night at home. He bent over to straighten a rug out and he felt a sudden pop in the left knee. He immediately felt a sharp pain and the knee buckled under him. He was able to catch himself on a table before he actually fell to the floor. Since then he has had pain in the back of the knee, and he has trouble bearing weight on it. No swelling. He applied heat and took some Tramadol. Also he has had increased trouble hearing in both ears and we often often have to clean out wax from them. No ear pain.    Review of Systems  Constitutional: Negative.   Respiratory: Negative.   Cardiovascular: Negative.   Musculoskeletal: Positive for arthralgias.       Objective:   Physical Exam Constitutional:      Comments: In a wheelchair   HENT:     Right Ear: There is impacted cerumen.     Left Ear: Tympanic membrane, ear canal and external ear normal.  Cardiovascular:     Rate and Rhythm: Normal rate. Rhythm irregular.     Pulses: Normal pulses.     Heart sounds: Normal heart sounds.  Pulmonary:     Effort: Pulmonary effort is normal.     Breath sounds: Normal breath sounds.  Musculoskeletal:     Comments: The left knee is not swollen or warm or red. He is tender in both joint spaces. ROM is limited by pain and crepitus is present. Anterior drawer is negative. McMurrays is negative. Xrays of the left knee are normal.   Neurological:     Mental Status: He is alert.           Assessment & Plan:  Left knee sprain, possible meniscus tear. He will stay off the leg, apply ice, wear an elastic support sleeve, and use Norco or Tramadol for pain. He will let me know in one week how he is doing. If there is no improvement we may consider a referral to Orthopedics. The cerumen impaction was irrigated clear with water.  Alysia Penna, MD

## 2019-03-05 ENCOUNTER — Telehealth: Payer: Self-pay | Admitting: Family Medicine

## 2019-03-05 DIAGNOSIS — S8392XD Sprain of unspecified site of left knee, subsequent encounter: Secondary | ICD-10-CM

## 2019-03-05 NOTE — Telephone Encounter (Signed)
My dad, [name] Brown, saw Dr Sarajane Jews on Monday morning. It was discussed that if needed physical and occupational therapies could be ordered to facilitate strengthening and helping ease ADLs. We would like to proceed with these since my dads knee is feeling better. His dob is 07-31-2036. I appreciate your settings these therapies up for him.  Dr. Sarajane Jews please advise. thanks

## 2019-03-05 NOTE — Telephone Encounter (Signed)
I ordered PT

## 2019-03-31 ENCOUNTER — Other Ambulatory Visit: Payer: Self-pay | Admitting: Family Medicine

## 2019-04-06 NOTE — Telephone Encounter (Signed)
Dr. Fry please advise of refill. Thanks 

## 2019-04-07 NOTE — Telephone Encounter (Signed)
temazepam (RESTORIL) 30 MG capsule  Pt received a call from Pharmacy that provider has not responded to refill request / please advise

## 2019-04-07 NOTE — Telephone Encounter (Signed)
Patient calling to check the status of getting this medication sent to the pharmacy. States that it has been waiting for a week and he had been out. Would like a call back from the office tomorrow morning to discuss. Please advise.

## 2019-04-08 NOTE — Telephone Encounter (Signed)
Call in #90 with one rf 

## 2019-04-08 NOTE — Telephone Encounter (Signed)
Refill has been called to the pharmacy and left on the VM.  

## 2019-04-08 NOTE — Telephone Encounter (Signed)
Pt is getting upset, this has not been done and he says he has called 4 times. Please FU with pt at [phone]. Needs a call to pt at this point from Dr Barbie Banner nurse on what the issue may be.

## 2019-04-15 DIAGNOSIS — E039 Hypothyroidism, unspecified: Secondary | ICD-10-CM | POA: Diagnosis not present

## 2019-04-15 DIAGNOSIS — Z940 Kidney transplant status: Secondary | ICD-10-CM | POA: Diagnosis not present

## 2019-04-15 DIAGNOSIS — M109 Gout, unspecified: Secondary | ICD-10-CM | POA: Diagnosis not present

## 2019-04-15 DIAGNOSIS — E119 Type 2 diabetes mellitus without complications: Secondary | ICD-10-CM | POA: Diagnosis not present

## 2019-04-15 DIAGNOSIS — I5022 Chronic systolic (congestive) heart failure: Secondary | ICD-10-CM | POA: Diagnosis not present

## 2019-04-15 DIAGNOSIS — E785 Hyperlipidemia, unspecified: Secondary | ICD-10-CM | POA: Diagnosis not present

## 2019-04-15 DIAGNOSIS — M160 Bilateral primary osteoarthritis of hip: Secondary | ICD-10-CM | POA: Diagnosis not present

## 2019-04-15 DIAGNOSIS — I480 Paroxysmal atrial fibrillation: Secondary | ICD-10-CM | POA: Diagnosis not present

## 2019-04-15 DIAGNOSIS — N189 Chronic kidney disease, unspecified: Secondary | ICD-10-CM | POA: Diagnosis not present

## 2019-04-15 DIAGNOSIS — D631 Anemia in chronic kidney disease: Secondary | ICD-10-CM | POA: Diagnosis not present

## 2019-05-03 DIAGNOSIS — E1042 Type 1 diabetes mellitus with diabetic polyneuropathy: Secondary | ICD-10-CM | POA: Diagnosis not present

## 2019-05-24 ENCOUNTER — Other Ambulatory Visit: Payer: Self-pay

## 2019-06-16 DIAGNOSIS — E785 Hyperlipidemia, unspecified: Secondary | ICD-10-CM | POA: Diagnosis not present

## 2019-06-16 DIAGNOSIS — M160 Bilateral primary osteoarthritis of hip: Secondary | ICD-10-CM | POA: Diagnosis not present

## 2019-06-16 DIAGNOSIS — I480 Paroxysmal atrial fibrillation: Secondary | ICD-10-CM | POA: Diagnosis not present

## 2019-06-16 DIAGNOSIS — D631 Anemia in chronic kidney disease: Secondary | ICD-10-CM | POA: Diagnosis not present

## 2019-06-16 DIAGNOSIS — N189 Chronic kidney disease, unspecified: Secondary | ICD-10-CM | POA: Diagnosis not present

## 2019-06-16 DIAGNOSIS — Z940 Kidney transplant status: Secondary | ICD-10-CM | POA: Diagnosis not present

## 2019-06-16 DIAGNOSIS — E119 Type 2 diabetes mellitus without complications: Secondary | ICD-10-CM | POA: Diagnosis not present

## 2019-06-16 DIAGNOSIS — E039 Hypothyroidism, unspecified: Secondary | ICD-10-CM | POA: Diagnosis not present

## 2019-06-16 DIAGNOSIS — M109 Gout, unspecified: Secondary | ICD-10-CM | POA: Diagnosis not present

## 2019-06-16 DIAGNOSIS — I5022 Chronic systolic (congestive) heart failure: Secondary | ICD-10-CM | POA: Diagnosis not present

## 2019-06-16 LAB — CBC AND DIFFERENTIAL
HCT: 31 — AB (ref 41–53)
Hemoglobin: 10.4 — AB (ref 13.5–17.5)
Platelets: 128 — AB (ref 150–399)
WBC: 7.4

## 2019-06-16 LAB — HEPATIC FUNCTION PANEL
ALT: 7 — AB (ref 10–40)
AST: 16 (ref 14–40)
Bilirubin, Total: 0.6

## 2019-06-16 LAB — IRON,TIBC AND FERRITIN PANEL: Iron: 41

## 2019-06-16 LAB — BASIC METABOLIC PANEL
BUN: 41 — AB (ref 4–21)
Creatinine: 2.7 — AB (ref 0.6–1.3)
Glucose: 91
Potassium: 4.6 (ref 3.4–5.3)

## 2019-06-21 ENCOUNTER — Encounter: Payer: Self-pay | Admitting: Family Medicine

## 2019-07-16 ENCOUNTER — Encounter: Payer: Self-pay | Admitting: Family Medicine

## 2019-07-16 ENCOUNTER — Other Ambulatory Visit: Payer: Self-pay

## 2019-07-16 ENCOUNTER — Ambulatory Visit (INDEPENDENT_AMBULATORY_CARE_PROVIDER_SITE_OTHER): Payer: Medicare Other | Admitting: Family Medicine

## 2019-07-16 VITALS — BP 120/82 | HR 68 | Temp 98.0°F | Ht 71.0 in | Wt 220.0 lb

## 2019-07-16 DIAGNOSIS — H6123 Impacted cerumen, bilateral: Secondary | ICD-10-CM

## 2019-07-16 DIAGNOSIS — Z23 Encounter for immunization: Secondary | ICD-10-CM | POA: Diagnosis not present

## 2019-07-16 NOTE — Progress Notes (Signed)
   Subjective:    Patient ID: [name] Brown, male    DOB: Oct 04, 1936, 83 y.o.   MRN: [id number]  HPI Here to have his ears cleaned out. He tends to accumulate cerumen quickly. Otherwise he feels well. He recently saw Dr. Lorrene Reid and his renal status is stable. His BP is stable and his A1c was excellent at 6.3.    Review of Systems  Constitutional: Negative.   HENT: Positive for hearing loss. Negative for ear pain.   Eyes: Negative.   Respiratory: Negative.   Cardiovascular: Negative.        Objective:   Physical Exam Constitutional:      Appearance: Normal appearance.  HENT:     Ears:     Comments: Both ear canals are full of cerumen  Cardiovascular:     Rate and Rhythm: Normal rate. Rhythm irregular.     Pulses: Normal pulses.     Comments: 2/6 SM as usual  Pulmonary:     Effort: Pulmonary effort is normal.     Breath sounds: Normal breath sounds.  Neurological:     Mental Status: He is alert.           Assessment & Plan:  Cerumen impaction, both canals were irrigated clear with water. Given a flu shot.  Alysia Penna, MD

## 2019-07-16 NOTE — Patient Instructions (Signed)
Health Maintenance Due  Topic Date Due  . OPHTHALMOLOGY EXAM  07/20/1948  . URINE MICROALBUMIN  12/07/2010  . FOOT EXAM  02/25/2015  . HEMOGLOBIN A1C  09/05/2017  . INFLUENZA VACCINE  05/22/2019    Depression screen PHQ 2/9 12/09/2014  Decreased Interest 0  Down, Depressed, Hopeless 0  PHQ - 2 Score 0  Some recent data might be hidden

## 2019-07-30 ENCOUNTER — Other Ambulatory Visit: Payer: Self-pay | Admitting: Family Medicine

## 2019-08-02 ENCOUNTER — Other Ambulatory Visit (HOSPITAL_COMMUNITY): Payer: Self-pay

## 2019-08-02 DIAGNOSIS — I4891 Unspecified atrial fibrillation: Secondary | ICD-10-CM

## 2019-08-02 MED ORDER — DILTIAZEM HCL ER COATED BEADS 120 MG PO CP24: ORAL_CAPSULE | ORAL | 1 refills | Status: DC

## 2019-08-04 DIAGNOSIS — L603 Nail dystrophy: Secondary | ICD-10-CM | POA: Diagnosis not present

## 2019-08-04 DIAGNOSIS — E1051 Type 1 diabetes mellitus with diabetic peripheral angiopathy without gangrene: Secondary | ICD-10-CM | POA: Diagnosis not present

## 2019-08-04 DIAGNOSIS — I739 Peripheral vascular disease, unspecified: Secondary | ICD-10-CM | POA: Diagnosis not present

## 2019-08-30 ENCOUNTER — Other Ambulatory Visit: Payer: Self-pay | Admitting: Family Medicine

## 2019-08-30 ENCOUNTER — Ambulatory Visit: Payer: Self-pay | Admitting: *Deleted

## 2019-08-30 NOTE — Telephone Encounter (Signed)
Pt has been scheduled.  °

## 2019-08-30 NOTE — Telephone Encounter (Signed)
Okay for refill?  

## 2019-08-30 NOTE — Telephone Encounter (Signed)
Patient calls with nasal congestion and runny nose, runs mostly down back of throat causing a sore throat. Has a mild dry cough. These symptoms began occurring about 2 weeks ago. Tried zyrtec first then Nasonex neither has helped. Denies fever/SOB/CP/HA/eye or facial pressure. No wheezing. Patient requesting something for sinuses. Encouraged fluids and rest. Has significant medical hx and stated he has not been in contact with anyone and has not left his home. Pharmacy on file. Routing to provider for further advice for patient.  Reason for Disposition . [1] Nasal discharge AND [2] present > 10 days  Answer Assessment - Initial Assessment Questions 1. ONSET: "When did the nasal discharge start?"     About 2 weeks. 2. AMOUNT: "How much discharge is there?"      Mainly down the throat 3. COUGH: "Do you have a cough?" If yes, ask: "Describe the color of your sputum" (clear, white, yellow, green)     Dry cough with a tickle 4. RESPIRATORY DISTRESS: "Describe your breathing."     No difficulty breathing 5. FEVER: "Do you have a fever?" If so, ask: "What is your temperature, how was it measured, and when did it start?"    No fever 6. SEVERITY: "Overall, how bad are you feeling right now?" (e.g., doesn't interfere with normal activities, staying home from school/work, staying in bed)      Cough does not keep him awake. 7. OTHER SYMPTOMS: "Do you have any other symptoms?" (e.g., sore throat, earache, wheezing, vomiting)    Sore throat, runny nose and congestion only 8. PREGNANCY: "Is there any chance you are pregnant?" "When was your last menstrual period?"     na  Protocols used: COMMON COLD-A-AH

## 2019-08-31 ENCOUNTER — Other Ambulatory Visit: Payer: Self-pay

## 2019-08-31 ENCOUNTER — Telehealth (INDEPENDENT_AMBULATORY_CARE_PROVIDER_SITE_OTHER): Payer: Medicare Other | Admitting: Family Medicine

## 2019-08-31 ENCOUNTER — Encounter: Payer: Self-pay | Admitting: Family Medicine

## 2019-08-31 ENCOUNTER — Telehealth: Payer: Medicare Other | Admitting: Physician Assistant

## 2019-08-31 DIAGNOSIS — J019 Acute sinusitis, unspecified: Secondary | ICD-10-CM | POA: Diagnosis not present

## 2019-08-31 DIAGNOSIS — J310 Chronic rhinitis: Secondary | ICD-10-CM | POA: Diagnosis not present

## 2019-08-31 MED ORDER — AZITHROMYCIN 250 MG PO TABS: ORAL_TABLET | ORAL | 0 refills | Status: DC

## 2019-08-31 MED ORDER — AZELASTINE HCL 0.1 % NA SOLN: 1.0000 | Freq: Two times a day (BID) | NASAL | 12 refills | Status: DC

## 2019-08-31 NOTE — Progress Notes (Signed)
E visit for Rhinitis We are sorry that you are not feeling well.  Here is how we plan to help!  Based on what you have shared with me it looks like you have Rhinitis.  Most types of rhinitis are caused by an inflammation and are associated with symptoms in the eyes ears or throat. There are several types of rhinitis.  The most common are acute rhinitis, which is usually caused by a viral illness, allergic or seasonal rhinitis, and nonallergic or year-round rhinitis.  Nasal allergies occur certain times of the year.  Allergic rhinitis is caused when allergens in the air trigger the release of histamine in the body.  Histamine causes itching, swelling, and fluid to build up in the fragile linings of the nasal passages, sinuses and eyelids.  An itchy nose and clear discharge are common.  Stop using Nasacort. Start Azelastine.   I recommend the following over the counter treatments: Allegra 60 mg twice daily  I also would recommend a nasal spray: Saline 1 spray into each nostril as needed   HOME CARE:   You can use an over-the-counter saline nasal spray as needed  Avoid areas where there is heavy dust, mites, or molds  Stay indoors on windy days during the pollen season  Keep windows closed in home, at least in bedroom; use air conditioner.  Use high-efficiency house air filter  Keep windows closed in car, turn AC on re-circulate  Avoid playing out with dog during pollen season  GET HELP RIGHT AWAY IF:   If your symptoms do not improve within 10 days  You become short of breath  You develop yellow or green discharge from your nose for over 3 days  You have coughing fits  MAKE SURE YOU:   Understand these instructions  Will watch your condition  Will get help right away if you are not doing well or get worse  Thank you for choosing an e-visit. Your e-visit answers were reviewed by a board certified advanced clinical practitioner to complete your personal care plan.  Depending upon the condition, your plan could have included both over the counter or prescription medications. Please review your pharmacy choice. Be sure that the pharmacy you have chosen is open so that you can pick up your prescription now.  If there is a problem you may message your provider in [place] to have the prescription routed to another pharmacy. Your safety is important to [place]. If you have drug allergies check your prescription carefully.  For the next 24 hours, you can use MyChart to ask questions about today's visit, request a non-urgent call back, or ask for a work or school excuse from your e-visit provider. You will get an email in the next two days asking about your experience. I hope that your e-visit has been valuable and will speed your recovery.     Greater than 5 minutes, yet less than 10 minutes of time have been spent researching, coordinating and implementing care for this patient today.

## 2019-08-31 NOTE — Progress Notes (Signed)
Virtual Visit via Telephone Note  I connected with the patient on 08/31/19 at 11:30 AM EST by telephone and verified that I am speaking with the correct person using two identifiers. We attempted to connect virtually but we had technical difficulties with the audio and video.     I discussed the limitations, risks, security and privacy concerns of performing an evaluation and management service by telephone and the availability of in person appointments. I also discussed with the patient that there may be a patient responsible charge related to this service. The patient expressed understanding and agreed to proceed.  Location patient: home Location provider: work or home office Participants present for the call: patient, provider Patient did not have a visit in the prior 7 days to address this/these issue(s).   History of Present Illness: Here for 2 weeks of stuffy  Head, PND, ST, and a dry cough. No fever or headache or SOB. No body aches or NVD. Using Nasacort daily.    Observations/Objective: Patient sounds cheerful and well on the phone. I do not appreciate any SOB. Speech and thought processing are grossly intact. Patient reported vitals:  Assessment and Plan: Sinusitis, treat with a Zpack. He will also add Claritin daily. Recheck prn.  Alysia Penna, MD   Follow Up Instructions:     [phone] 5-10 [phone] 11-20 [phone] 21-30 I did not refer this patient for an OV in the next 24 hours for this/these issue(s).  I discussed the assessment and treatment plan with the patient. The patient was provided an opportunity to ask questions and all were answered. The patient agreed with the plan and demonstrated an understanding of the instructions.   The patient was advised to call back or seek an in-person evaluation if the symptoms worsen or if the condition fails to improve as anticipated.  I provided 13 minutes of non-face-to-face time during this encounter.   Alysia Penna, MD

## 2019-09-02 ENCOUNTER — Telehealth: Payer: Self-pay

## 2019-09-02 NOTE — Telephone Encounter (Signed)
Copied from [place] [id number]. Topic: General - Other >> Sep 02, 2019 11:45 AM Yvette Rack wrote: Reason for CRM: Pt stated he was advised by the pharmacist that there could be an interaction with him taking the azithromycin (ZITHROMAX Z-PAK) 250 MG tablet and amiodarone (PACERONE) 200 MG tablet. Pt stated he wanted to make Dr. Sarajane Jews aware of this and requests a call back.

## 2019-09-03 MED ORDER — DOXYCYCLINE HYCLATE 100 MG PO TABS: 100.0000 mg | ORAL_TABLET | Freq: Two times a day (BID) | ORAL | 0 refills | Status: DC

## 2019-09-03 NOTE — Telephone Encounter (Signed)
I understand. Cancel the Zpack and call in Doxycycline 100 mg bid for 10 days

## 2019-09-03 NOTE — Telephone Encounter (Signed)
Rx sent to pharmacy. Pt notified of update. 

## 2019-09-15 ENCOUNTER — Other Ambulatory Visit: Payer: Self-pay

## 2019-09-20 DIAGNOSIS — I5022 Chronic systolic (congestive) heart failure: Secondary | ICD-10-CM | POA: Diagnosis not present

## 2019-09-20 DIAGNOSIS — I480 Paroxysmal atrial fibrillation: Secondary | ICD-10-CM | POA: Diagnosis not present

## 2019-09-20 DIAGNOSIS — E039 Hypothyroidism, unspecified: Secondary | ICD-10-CM | POA: Diagnosis not present

## 2019-09-20 DIAGNOSIS — Z940 Kidney transplant status: Secondary | ICD-10-CM | POA: Diagnosis not present

## 2019-09-20 DIAGNOSIS — D631 Anemia in chronic kidney disease: Secondary | ICD-10-CM | POA: Diagnosis not present

## 2019-09-20 DIAGNOSIS — M160 Bilateral primary osteoarthritis of hip: Secondary | ICD-10-CM | POA: Diagnosis not present

## 2019-09-20 DIAGNOSIS — E785 Hyperlipidemia, unspecified: Secondary | ICD-10-CM | POA: Diagnosis not present

## 2019-09-20 DIAGNOSIS — N189 Chronic kidney disease, unspecified: Secondary | ICD-10-CM | POA: Diagnosis not present

## 2019-09-20 DIAGNOSIS — E119 Type 2 diabetes mellitus without complications: Secondary | ICD-10-CM | POA: Diagnosis not present

## 2019-09-20 DIAGNOSIS — M109 Gout, unspecified: Secondary | ICD-10-CM | POA: Diagnosis not present

## 2019-10-02 ENCOUNTER — Other Ambulatory Visit: Payer: Self-pay | Admitting: Family Medicine

## 2019-10-04 NOTE — Telephone Encounter (Signed)
Last filled 04/08/2019 Last OV 07/16/2019  Ok to fill?

## 2019-10-06 ENCOUNTER — Other Ambulatory Visit: Payer: Self-pay | Admitting: Family Medicine

## 2019-10-11 ENCOUNTER — Telehealth: Payer: Self-pay | Admitting: Family Medicine

## 2019-10-11 NOTE — Telephone Encounter (Signed)
Handicap Placard to be filled out- placed in dr's folder.  Mail to patient in attached self-addressed envelope.

## 2019-10-12 NOTE — Telephone Encounter (Signed)
Noted. Form has been placed in the mail. Patient is aware.

## 2019-10-27 ENCOUNTER — Other Ambulatory Visit: Payer: Self-pay | Admitting: Family Medicine

## 2019-11-08 DIAGNOSIS — E1042 Type 1 diabetes mellitus with diabetic polyneuropathy: Secondary | ICD-10-CM | POA: Diagnosis not present

## 2019-12-04 DIAGNOSIS — D8489 Other immunodeficiencies: Secondary | ICD-10-CM | POA: Diagnosis not present

## 2019-12-04 DIAGNOSIS — R930 Abnormal findings on diagnostic imaging of skull and head, not elsewhere classified: Secondary | ICD-10-CM | POA: Diagnosis not present

## 2019-12-04 DIAGNOSIS — I5030 Unspecified diastolic (congestive) heart failure: Secondary | ICD-10-CM | POA: Diagnosis not present

## 2019-12-04 DIAGNOSIS — Z4822 Encounter for aftercare following kidney transplant: Secondary | ICD-10-CM | POA: Diagnosis not present

## 2019-12-04 DIAGNOSIS — S40022A Contusion of left upper arm, initial encounter: Secondary | ICD-10-CM | POA: Diagnosis not present

## 2019-12-04 DIAGNOSIS — Z8616 Personal history of COVID-19: Secondary | ICD-10-CM | POA: Diagnosis not present

## 2019-12-04 DIAGNOSIS — Z794 Long term (current) use of insulin: Secondary | ICD-10-CM | POA: Diagnosis not present

## 2019-12-04 DIAGNOSIS — R262 Difficulty in walking, not elsewhere classified: Secondary | ICD-10-CM | POA: Diagnosis not present

## 2019-12-04 DIAGNOSIS — Z992 Dependence on renal dialysis: Secondary | ICD-10-CM | POA: Diagnosis not present

## 2019-12-04 DIAGNOSIS — D631 Anemia in chronic kidney disease: Secondary | ICD-10-CM | POA: Diagnosis not present

## 2019-12-04 DIAGNOSIS — I6200 Nontraumatic subdural hemorrhage, unspecified: Secondary | ICD-10-CM | POA: Diagnosis not present

## 2019-12-04 DIAGNOSIS — W19XXXA Unspecified fall, initial encounter: Secondary | ICD-10-CM | POA: Diagnosis not present

## 2019-12-04 DIAGNOSIS — R918 Other nonspecific abnormal finding of lung field: Secondary | ICD-10-CM | POA: Diagnosis not present

## 2019-12-04 DIAGNOSIS — K5900 Constipation, unspecified: Secondary | ICD-10-CM | POA: Diagnosis not present

## 2019-12-04 DIAGNOSIS — Y929 Unspecified place or not applicable: Secondary | ICD-10-CM | POA: Diagnosis not present

## 2019-12-04 DIAGNOSIS — Z9181 History of falling: Secondary | ICD-10-CM | POA: Diagnosis not present

## 2019-12-04 DIAGNOSIS — M545 Low back pain: Secondary | ICD-10-CM | POA: Diagnosis not present

## 2019-12-04 DIAGNOSIS — I5032 Chronic diastolic (congestive) heart failure: Secondary | ICD-10-CM | POA: Diagnosis present

## 2019-12-04 DIAGNOSIS — E875 Hyperkalemia: Secondary | ICD-10-CM | POA: Diagnosis not present

## 2019-12-04 DIAGNOSIS — I4581 Long QT syndrome: Secondary | ICD-10-CM | POA: Diagnosis not present

## 2019-12-04 DIAGNOSIS — I480 Paroxysmal atrial fibrillation: Secondary | ICD-10-CM | POA: Diagnosis not present

## 2019-12-04 DIAGNOSIS — E119 Type 2 diabetes mellitus without complications: Secondary | ICD-10-CM | POA: Diagnosis not present

## 2019-12-04 DIAGNOSIS — W1830XA Fall on same level, unspecified, initial encounter: Secondary | ICD-10-CM | POA: Diagnosis not present

## 2019-12-04 DIAGNOSIS — I444 Left anterior fascicular block: Secondary | ICD-10-CM | POA: Diagnosis not present

## 2019-12-04 DIAGNOSIS — E872 Acidosis: Secondary | ICD-10-CM | POA: Diagnosis not present

## 2019-12-04 DIAGNOSIS — R188 Other ascites: Secondary | ICD-10-CM | POA: Diagnosis not present

## 2019-12-04 DIAGNOSIS — N179 Acute kidney failure, unspecified: Secondary | ICD-10-CM | POA: Diagnosis not present

## 2019-12-04 DIAGNOSIS — E871 Hypo-osmolality and hyponatremia: Secondary | ICD-10-CM | POA: Diagnosis not present

## 2019-12-04 DIAGNOSIS — Z7189 Other specified counseling: Secondary | ICD-10-CM | POA: Diagnosis not present

## 2019-12-04 DIAGNOSIS — Z96651 Presence of right artificial knee joint: Secondary | ICD-10-CM | POA: Diagnosis not present

## 2019-12-04 DIAGNOSIS — I110 Hypertensive heart disease with heart failure: Secondary | ICD-10-CM | POA: Diagnosis not present

## 2019-12-04 DIAGNOSIS — R7989 Other specified abnormal findings of blood chemistry: Secondary | ICD-10-CM | POA: Diagnosis not present

## 2019-12-04 DIAGNOSIS — L89322 Pressure ulcer of left buttock, stage 2: Secondary | ICD-10-CM | POA: Diagnosis not present

## 2019-12-04 DIAGNOSIS — N390 Urinary tract infection, site not specified: Secondary | ICD-10-CM | POA: Diagnosis not present

## 2019-12-04 DIAGNOSIS — Z043 Encounter for examination and observation following other accident: Secondary | ICD-10-CM | POA: Diagnosis not present

## 2019-12-04 DIAGNOSIS — R778 Other specified abnormalities of plasma proteins: Secondary | ICD-10-CM | POA: Diagnosis not present

## 2019-12-04 DIAGNOSIS — T07XXXA Unspecified multiple injuries, initial encounter: Secondary | ICD-10-CM | POA: Diagnosis not present

## 2019-12-04 DIAGNOSIS — R0902 Hypoxemia: Secondary | ICD-10-CM | POA: Diagnosis not present

## 2019-12-04 DIAGNOSIS — Z66 Do not resuscitate: Secondary | ICD-10-CM | POA: Diagnosis present

## 2019-12-04 DIAGNOSIS — S301XXA Contusion of abdominal wall, initial encounter: Secondary | ICD-10-CM | POA: Diagnosis not present

## 2019-12-04 DIAGNOSIS — M6282 Rhabdomyolysis: Secondary | ICD-10-CM | POA: Diagnosis present

## 2019-12-04 DIAGNOSIS — E1122 Type 2 diabetes mellitus with diabetic chronic kidney disease: Secondary | ICD-10-CM | POA: Diagnosis not present

## 2019-12-04 DIAGNOSIS — I2510 Atherosclerotic heart disease of native coronary artery without angina pectoris: Secondary | ICD-10-CM | POA: Diagnosis not present

## 2019-12-04 DIAGNOSIS — M546 Pain in thoracic spine: Secondary | ICD-10-CM | POA: Diagnosis not present

## 2019-12-04 DIAGNOSIS — E785 Hyperlipidemia, unspecified: Secondary | ICD-10-CM | POA: Diagnosis not present

## 2019-12-04 DIAGNOSIS — I248 Other forms of acute ischemic heart disease: Secondary | ICD-10-CM | POA: Diagnosis present

## 2019-12-04 DIAGNOSIS — S40021A Contusion of right upper arm, initial encounter: Secondary | ICD-10-CM | POA: Diagnosis not present

## 2019-12-04 DIAGNOSIS — L89156 Pressure-induced deep tissue damage of sacral region: Secondary | ICD-10-CM | POA: Diagnosis not present

## 2019-12-04 DIAGNOSIS — J9601 Acute respiratory failure with hypoxia: Secondary | ICD-10-CM | POA: Diagnosis not present

## 2019-12-04 DIAGNOSIS — B9629 Other Escherichia coli [E. coli] as the cause of diseases classified elsewhere: Secondary | ICD-10-CM | POA: Diagnosis not present

## 2019-12-04 DIAGNOSIS — R6520 Severe sepsis without septic shock: Secondary | ICD-10-CM | POA: Diagnosis present

## 2019-12-04 DIAGNOSIS — E1129 Type 2 diabetes mellitus with other diabetic kidney complication: Secondary | ICD-10-CM | POA: Diagnosis not present

## 2019-12-04 DIAGNOSIS — Z743 Need for continuous supervision: Secondary | ICD-10-CM | POA: Diagnosis not present

## 2019-12-04 DIAGNOSIS — R4781 Slurred speech: Secondary | ICD-10-CM | POA: Diagnosis not present

## 2019-12-04 DIAGNOSIS — I517 Cardiomegaly: Secondary | ICD-10-CM | POA: Diagnosis not present

## 2019-12-04 DIAGNOSIS — R531 Weakness: Secondary | ICD-10-CM | POA: Diagnosis not present

## 2019-12-04 DIAGNOSIS — E118 Type 2 diabetes mellitus with unspecified complications: Secondary | ICD-10-CM | POA: Diagnosis not present

## 2019-12-04 DIAGNOSIS — I4891 Unspecified atrial fibrillation: Secondary | ICD-10-CM | POA: Diagnosis not present

## 2019-12-04 DIAGNOSIS — S20219A Contusion of unspecified front wall of thorax, initial encounter: Secondary | ICD-10-CM | POA: Diagnosis not present

## 2019-12-04 DIAGNOSIS — D638 Anemia in other chronic diseases classified elsewhere: Secondary | ICD-10-CM | POA: Diagnosis present

## 2019-12-04 DIAGNOSIS — E11649 Type 2 diabetes mellitus with hypoglycemia without coma: Secondary | ICD-10-CM | POA: Diagnosis not present

## 2019-12-04 DIAGNOSIS — R5381 Other malaise: Secondary | ICD-10-CM | POA: Diagnosis not present

## 2019-12-04 DIAGNOSIS — Y939 Activity, unspecified: Secondary | ICD-10-CM | POA: Diagnosis not present

## 2019-12-04 DIAGNOSIS — N186 End stage renal disease: Secondary | ICD-10-CM | POA: Diagnosis not present

## 2019-12-04 DIAGNOSIS — E162 Hypoglycemia, unspecified: Secondary | ICD-10-CM | POA: Diagnosis not present

## 2019-12-04 DIAGNOSIS — A4189 Other specified sepsis: Secondary | ICD-10-CM | POA: Diagnosis not present

## 2019-12-04 DIAGNOSIS — N184 Chronic kidney disease, stage 4 (severe): Secondary | ICD-10-CM | POA: Diagnosis not present

## 2019-12-04 DIAGNOSIS — D696 Thrombocytopenia, unspecified: Secondary | ICD-10-CM | POA: Diagnosis present

## 2019-12-04 DIAGNOSIS — Z79899 Other long term (current) drug therapy: Secondary | ICD-10-CM | POA: Diagnosis not present

## 2019-12-04 DIAGNOSIS — I959 Hypotension, unspecified: Secondary | ICD-10-CM | POA: Diagnosis not present

## 2019-12-04 DIAGNOSIS — Z96641 Presence of right artificial hip joint: Secondary | ICD-10-CM | POA: Diagnosis not present

## 2019-12-04 DIAGNOSIS — I083 Combined rheumatic disorders of mitral, aortic and tricuspid valves: Secondary | ICD-10-CM | POA: Diagnosis not present

## 2019-12-04 DIAGNOSIS — B9620 Unspecified Escherichia coli [E. coli] as the cause of diseases classified elsewhere: Secondary | ICD-10-CM | POA: Diagnosis not present

## 2019-12-04 DIAGNOSIS — U071 COVID-19: Secondary | ICD-10-CM | POA: Diagnosis not present

## 2019-12-04 DIAGNOSIS — L89611 Pressure ulcer of right heel, stage 1: Secondary | ICD-10-CM | POA: Diagnosis not present

## 2019-12-04 DIAGNOSIS — T8612 Kidney transplant failure: Secondary | ICD-10-CM | POA: Diagnosis not present

## 2019-12-04 DIAGNOSIS — T8619 Other complication of kidney transplant: Secondary | ICD-10-CM | POA: Diagnosis not present

## 2019-12-04 DIAGNOSIS — D849 Immunodeficiency, unspecified: Secondary | ICD-10-CM | POA: Diagnosis not present

## 2019-12-04 DIAGNOSIS — Z951 Presence of aortocoronary bypass graft: Secondary | ICD-10-CM | POA: Diagnosis not present

## 2019-12-04 DIAGNOSIS — E039 Hypothyroidism, unspecified: Secondary | ICD-10-CM | POA: Diagnosis present

## 2019-12-04 DIAGNOSIS — I272 Pulmonary hypertension, unspecified: Secondary | ICD-10-CM | POA: Diagnosis present

## 2019-12-04 DIAGNOSIS — R279 Unspecified lack of coordination: Secondary | ICD-10-CM | POA: Diagnosis not present

## 2019-12-04 DIAGNOSIS — W06XXXA Fall from bed, initial encounter: Secondary | ICD-10-CM | POA: Diagnosis not present

## 2019-12-04 DIAGNOSIS — L89150 Pressure ulcer of sacral region, unstageable: Secondary | ICD-10-CM | POA: Diagnosis not present

## 2019-12-04 DIAGNOSIS — J90 Pleural effusion, not elsewhere classified: Secondary | ICD-10-CM | POA: Diagnosis not present

## 2019-12-04 DIAGNOSIS — J1282 Pneumonia due to coronavirus disease 2019: Secondary | ICD-10-CM | POA: Diagnosis not present

## 2019-12-04 DIAGNOSIS — M109 Gout, unspecified: Secondary | ICD-10-CM | POA: Diagnosis not present

## 2019-12-04 DIAGNOSIS — K219 Gastro-esophageal reflux disease without esophagitis: Secondary | ICD-10-CM | POA: Diagnosis not present

## 2019-12-04 DIAGNOSIS — I1311 Hypertensive heart and chronic kidney disease without heart failure, with stage 5 chronic kidney disease, or end stage renal disease: Secondary | ICD-10-CM | POA: Diagnosis not present

## 2019-12-04 DIAGNOSIS — M6281 Muscle weakness (generalized): Secondary | ICD-10-CM | POA: Diagnosis not present

## 2019-12-04 DIAGNOSIS — Z7952 Long term (current) use of systemic steroids: Secondary | ICD-10-CM | POA: Diagnosis not present

## 2019-12-04 DIAGNOSIS — Z741 Need for assistance with personal care: Secondary | ICD-10-CM | POA: Diagnosis not present

## 2019-12-04 DIAGNOSIS — I493 Ventricular premature depolarization: Secondary | ICD-10-CM | POA: Diagnosis not present

## 2019-12-04 DIAGNOSIS — R296 Repeated falls: Secondary | ICD-10-CM | POA: Diagnosis not present

## 2019-12-04 DIAGNOSIS — I129 Hypertensive chronic kidney disease with stage 1 through stage 4 chronic kidney disease, or unspecified chronic kidney disease: Secondary | ICD-10-CM | POA: Diagnosis not present

## 2019-12-04 DIAGNOSIS — I454 Nonspecific intraventricular block: Secondary | ICD-10-CM | POA: Diagnosis not present

## 2019-12-04 DIAGNOSIS — Z940 Kidney transplant status: Secondary | ICD-10-CM | POA: Diagnosis not present

## 2019-12-04 DIAGNOSIS — E8770 Fluid overload, unspecified: Secondary | ICD-10-CM | POA: Diagnosis not present

## 2019-12-17 ENCOUNTER — Other Ambulatory Visit (HOSPITAL_COMMUNITY): Payer: Self-pay | Admitting: Internal Medicine

## 2019-12-17 MED ORDER — DEXTROMETHORPHAN-GUAIFENESIN 10-100 MG/5ML PO LIQD
5.00 | ORAL | Status: DC
Start: 2019-12-23 — End: 2019-12-17

## 2019-12-17 MED ORDER — SORBITOL 70 % PO SOLN
30.00 | ORAL | Status: DC
Start: ? — End: 2019-12-17

## 2019-12-17 MED ORDER — MYCOPHENOLATE SODIUM 180 MG PO TBEC
180.00 | DELAYED_RELEASE_TABLET | ORAL | Status: DC
Start: 2019-12-23 — End: 2019-12-17

## 2019-12-17 MED ORDER — ACETAMINOPHEN 325 MG PO TABS
650.00 | ORAL_TABLET | ORAL | Status: DC
Start: ? — End: 2019-12-17

## 2019-12-17 MED ORDER — INSULIN GLARGINE 100 UNIT/ML ~~LOC~~ SOLN
20.00 | SUBCUTANEOUS | Status: DC
Start: 2019-12-17 — End: 2019-12-17

## 2019-12-17 MED ORDER — TEMAZEPAM 15 MG PO CAPS
30.00 | ORAL_CAPSULE | ORAL | Status: DC
Start: 2019-12-23 — End: 2019-12-17

## 2019-12-17 MED ORDER — INSULIN LISPRO 100 UNIT/ML ~~LOC~~ SOLN
2.00 | SUBCUTANEOUS | Status: DC
Start: 2019-12-21 — End: 2019-12-17

## 2019-12-17 MED ORDER — PANTOPRAZOLE SODIUM 40 MG PO TBEC
40.00 | DELAYED_RELEASE_TABLET | ORAL | Status: DC
Start: 2019-12-24 — End: 2019-12-17

## 2019-12-17 MED ORDER — MELATONIN 3 MG PO TABS
3.00 | ORAL_TABLET | ORAL | Status: DC
Start: ? — End: 2019-12-17

## 2019-12-17 MED ORDER — ALBUTEROL SULFATE HFA 108 (90 BASE) MCG/ACT IN AERS
8.00 | INHALATION_SPRAY | RESPIRATORY_TRACT | Status: DC
Start: ? — End: 2019-12-17

## 2019-12-17 MED ORDER — GLUCOSE 40 % PO GEL
15.00 | ORAL | Status: DC
Start: ? — End: 2019-12-17

## 2019-12-17 MED ORDER — POLYETHYLENE GLYCOL 3350 17 GM/SCOOP PO POWD
17.00 | ORAL | Status: DC
Start: 2019-12-24 — End: 2019-12-17

## 2019-12-17 MED ORDER — APIXABAN 2.5 MG PO TABS
2.50 | ORAL_TABLET | ORAL | Status: DC
Start: 2019-12-23 — End: 2019-12-17

## 2019-12-17 MED ORDER — DSS 100 MG PO CAPS
100.00 | ORAL_CAPSULE | ORAL | Status: DC
Start: 2019-12-23 — End: 2019-12-17

## 2019-12-17 MED ORDER — PHENOL 1.4 % MT LIQD
1.00 | OROMUCOSAL | Status: DC
Start: ? — End: 2019-12-17

## 2019-12-17 MED ORDER — SEVELAMER CARBONATE 800 MG PO TABS
800.00 | ORAL_TABLET | ORAL | Status: DC
Start: 2019-12-23 — End: 2019-12-17

## 2019-12-17 MED ORDER — TACROLIMUS 0.5 MG PO CAPS
0.50 | ORAL_CAPSULE | ORAL | Status: DC
Start: 2019-12-24 — End: 2019-12-17

## 2019-12-17 MED ORDER — MAGNESIUM OXIDE 400 MG PO TABS
400.00 | ORAL_TABLET | ORAL | Status: DC
Start: 2019-12-23 — End: 2019-12-17

## 2019-12-17 MED ORDER — LEVOTHYROXINE SODIUM 75 MCG PO TABS
75.00 | ORAL_TABLET | ORAL | Status: DC
Start: 2019-12-24 — End: 2019-12-17

## 2019-12-17 MED ORDER — AMIODARONE HCL 200 MG PO TABS
200.00 | ORAL_TABLET | ORAL | Status: DC
Start: 2019-12-24 — End: 2019-12-17

## 2019-12-17 MED ORDER — DEXTROSE 10 % IV SOLN
125.00 | INTRAVENOUS | Status: DC
Start: ? — End: 2019-12-17

## 2019-12-17 MED ORDER — ASPIRIN 81 MG PO TBEC
81.00 | DELAYED_RELEASE_TABLET | ORAL | Status: DC
Start: 2019-12-24 — End: 2019-12-17

## 2019-12-17 MED ORDER — TACROLIMUS 1 MG PO CAPS
1.00 | ORAL_CAPSULE | ORAL | Status: DC
Start: 2019-12-23 — End: 2019-12-17

## 2019-12-20 MED ORDER — INSULIN GLARGINE 100 UNIT/ML ~~LOC~~ SOLN
5.00 | SUBCUTANEOUS | Status: DC
Start: 2019-12-21 — End: 2019-12-20

## 2019-12-20 MED ORDER — SODIUM CHLORIDE 1 G PO TABS
1000.00 | ORAL_TABLET | ORAL | Status: DC
Start: 2019-12-20 — End: 2019-12-20

## 2019-12-22 MED ORDER — FUROSEMIDE 40 MG PO TABS
40.00 | ORAL_TABLET | ORAL | Status: DC
Start: 2019-12-22 — End: 2019-12-22

## 2019-12-22 MED ORDER — POLYSACCHARIDE IRON COMPLEX 150 MG PO CAPS
150.00 | ORAL_CAPSULE | ORAL | Status: DC
Start: 2019-12-23 — End: 2019-12-22

## 2019-12-22 MED ORDER — GENERIC EXTERNAL MEDICATION
12.50 | Status: DC
Start: 2019-12-23 — End: 2019-12-22

## 2019-12-22 MED ORDER — INSULIN GLARGINE 100 UNIT/ML ~~LOC~~ SOLN
7.00 | SUBCUTANEOUS | Status: DC
Start: 2019-12-23 — End: 2019-12-22

## 2019-12-23 ENCOUNTER — Inpatient Hospital Stay
Admission: RE | Admit: 2019-12-23 | Discharge: 2020-01-20 | Disposition: E | Payer: Medicare Other | Source: Ambulatory Visit | Attending: Internal Medicine | Admitting: Internal Medicine

## 2019-12-23 DIAGNOSIS — I480 Paroxysmal atrial fibrillation: Secondary | ICD-10-CM | POA: Diagnosis not present

## 2019-12-23 DIAGNOSIS — N185 Chronic kidney disease, stage 5: Secondary | ICD-10-CM | POA: Diagnosis not present

## 2019-12-23 DIAGNOSIS — I1311 Hypertensive heart and chronic kidney disease without heart failure, with stage 5 chronic kidney disease, or end stage renal disease: Secondary | ICD-10-CM | POA: Diagnosis not present

## 2019-12-23 DIAGNOSIS — I132 Hypertensive heart and chronic kidney disease with heart failure and with stage 5 chronic kidney disease, or end stage renal disease: Secondary | ICD-10-CM | POA: Diagnosis not present

## 2019-12-23 DIAGNOSIS — I5022 Chronic systolic (congestive) heart failure: Secondary | ICD-10-CM | POA: Diagnosis not present

## 2019-12-23 DIAGNOSIS — Z741 Need for assistance with personal care: Secondary | ICD-10-CM | POA: Diagnosis not present

## 2019-12-23 DIAGNOSIS — Z940 Kidney transplant status: Secondary | ICD-10-CM | POA: Diagnosis not present

## 2019-12-23 DIAGNOSIS — E039 Hypothyroidism, unspecified: Secondary | ICD-10-CM | POA: Diagnosis not present

## 2019-12-23 DIAGNOSIS — I120 Hypertensive chronic kidney disease with stage 5 chronic kidney disease or end stage renal disease: Secondary | ICD-10-CM | POA: Diagnosis not present

## 2019-12-23 DIAGNOSIS — I4891 Unspecified atrial fibrillation: Secondary | ICD-10-CM | POA: Diagnosis not present

## 2019-12-23 DIAGNOSIS — Z743 Need for continuous supervision: Secondary | ICD-10-CM | POA: Diagnosis not present

## 2019-12-23 DIAGNOSIS — J9601 Acute respiratory failure with hypoxia: Secondary | ICD-10-CM | POA: Diagnosis not present

## 2019-12-23 DIAGNOSIS — R279 Unspecified lack of coordination: Secondary | ICD-10-CM | POA: Diagnosis not present

## 2019-12-23 DIAGNOSIS — E1169 Type 2 diabetes mellitus with other specified complication: Secondary | ICD-10-CM | POA: Diagnosis not present

## 2019-12-23 DIAGNOSIS — E875 Hyperkalemia: Secondary | ICD-10-CM | POA: Diagnosis not present

## 2019-12-23 DIAGNOSIS — F4323 Adjustment disorder with mixed anxiety and depressed mood: Secondary | ICD-10-CM | POA: Diagnosis not present

## 2019-12-23 DIAGNOSIS — Z992 Dependence on renal dialysis: Secondary | ICD-10-CM | POA: Diagnosis not present

## 2019-12-23 DIAGNOSIS — R5381 Other malaise: Secondary | ICD-10-CM | POA: Diagnosis not present

## 2019-12-23 DIAGNOSIS — D631 Anemia in chronic kidney disease: Secondary | ICD-10-CM | POA: Diagnosis not present

## 2019-12-23 DIAGNOSIS — Z96641 Presence of right artificial hip joint: Secondary | ICD-10-CM | POA: Diagnosis not present

## 2019-12-23 DIAGNOSIS — I2510 Atherosclerotic heart disease of native coronary artery without angina pectoris: Secondary | ICD-10-CM | POA: Diagnosis not present

## 2019-12-23 DIAGNOSIS — D696 Thrombocytopenia, unspecified: Secondary | ICD-10-CM | POA: Diagnosis not present

## 2019-12-23 DIAGNOSIS — N189 Chronic kidney disease, unspecified: Secondary | ICD-10-CM | POA: Diagnosis not present

## 2019-12-23 DIAGNOSIS — Z9181 History of falling: Secondary | ICD-10-CM | POA: Diagnosis not present

## 2019-12-23 DIAGNOSIS — T8612 Kidney transplant failure: Secondary | ICD-10-CM | POA: Diagnosis not present

## 2019-12-23 DIAGNOSIS — Z952 Presence of prosthetic heart valve: Secondary | ICD-10-CM | POA: Diagnosis not present

## 2019-12-23 DIAGNOSIS — I272 Pulmonary hypertension, unspecified: Secondary | ICD-10-CM | POA: Diagnosis not present

## 2019-12-23 DIAGNOSIS — Z8616 Personal history of COVID-19: Secondary | ICD-10-CM | POA: Diagnosis not present

## 2019-12-23 DIAGNOSIS — N186 End stage renal disease: Secondary | ICD-10-CM | POA: Diagnosis not present

## 2019-12-23 DIAGNOSIS — L89320 Pressure ulcer of left buttock, unstageable: Secondary | ICD-10-CM | POA: Diagnosis not present

## 2019-12-23 DIAGNOSIS — M1A30X0 Chronic gout due to renal impairment, unspecified site, without tophus (tophi): Secondary | ICD-10-CM | POA: Diagnosis not present

## 2019-12-23 DIAGNOSIS — R262 Difficulty in walking, not elsewhere classified: Secondary | ICD-10-CM | POA: Diagnosis not present

## 2019-12-23 DIAGNOSIS — E785 Hyperlipidemia, unspecified: Secondary | ICD-10-CM | POA: Diagnosis not present

## 2019-12-23 DIAGNOSIS — M6281 Muscle weakness (generalized): Secondary | ICD-10-CM | POA: Diagnosis not present

## 2019-12-23 DIAGNOSIS — N179 Acute kidney failure, unspecified: Secondary | ICD-10-CM | POA: Diagnosis not present

## 2019-12-23 DIAGNOSIS — L89890 Pressure ulcer of other site, unstageable: Secondary | ICD-10-CM | POA: Diagnosis not present

## 2019-12-23 DIAGNOSIS — E1122 Type 2 diabetes mellitus with diabetic chronic kidney disease: Secondary | ICD-10-CM | POA: Diagnosis not present

## 2019-12-23 DIAGNOSIS — D8489 Other immunodeficiencies: Secondary | ICD-10-CM | POA: Diagnosis not present

## 2019-12-23 DIAGNOSIS — K219 Gastro-esophageal reflux disease without esophagitis: Secondary | ICD-10-CM | POA: Diagnosis not present

## 2019-12-23 DIAGNOSIS — E1129 Type 2 diabetes mellitus with other diabetic kidney complication: Secondary | ICD-10-CM | POA: Diagnosis not present

## 2019-12-23 DIAGNOSIS — Z96651 Presence of right artificial knee joint: Secondary | ICD-10-CM | POA: Diagnosis not present

## 2019-12-23 DIAGNOSIS — I5032 Chronic diastolic (congestive) heart failure: Secondary | ICD-10-CM | POA: Diagnosis not present

## 2019-12-23 DIAGNOSIS — M109 Gout, unspecified: Secondary | ICD-10-CM | POA: Diagnosis not present

## 2019-12-23 DIAGNOSIS — J1282 Pneumonia due to coronavirus disease 2019: Secondary | ICD-10-CM | POA: Diagnosis not present

## 2019-12-23 DIAGNOSIS — U071 COVID-19: Secondary | ICD-10-CM | POA: Diagnosis not present

## 2019-12-23 MED ORDER — FUROSEMIDE 40 MG PO TABS
40.00 | ORAL_TABLET | ORAL | Status: DC
Start: 2019-12-24 — End: 2019-12-23

## 2019-12-24 ENCOUNTER — Other Ambulatory Visit: Payer: Self-pay | Admitting: Adult Health

## 2019-12-24 ENCOUNTER — Encounter: Payer: Self-pay | Admitting: Adult Health

## 2019-12-24 ENCOUNTER — Non-Acute Institutional Stay (SKILLED_NURSING_FACILITY): Payer: Medicare Other | Admitting: Adult Health

## 2019-12-24 DIAGNOSIS — I120 Hypertensive chronic kidney disease with stage 5 chronic kidney disease or end stage renal disease: Secondary | ICD-10-CM

## 2019-12-24 DIAGNOSIS — I25810 Atherosclerosis of coronary artery bypass graft(s) without angina pectoris: Secondary | ICD-10-CM | POA: Insufficient documentation

## 2019-12-24 DIAGNOSIS — K219 Gastro-esophageal reflux disease without esophagitis: Secondary | ICD-10-CM

## 2019-12-24 DIAGNOSIS — Z952 Presence of prosthetic heart valve: Secondary | ICD-10-CM

## 2019-12-24 DIAGNOSIS — Z940 Kidney transplant status: Secondary | ICD-10-CM | POA: Diagnosis not present

## 2019-12-24 DIAGNOSIS — J3089 Other allergic rhinitis: Secondary | ICD-10-CM

## 2019-12-24 DIAGNOSIS — M159 Polyosteoarthritis, unspecified: Secondary | ICD-10-CM

## 2019-12-24 DIAGNOSIS — L89320 Pressure ulcer of left buttock, unstageable: Secondary | ICD-10-CM

## 2019-12-24 DIAGNOSIS — I25708 Atherosclerosis of coronary artery bypass graft(s), unspecified, with other forms of angina pectoris: Secondary | ICD-10-CM

## 2019-12-24 DIAGNOSIS — E785 Hyperlipidemia, unspecified: Secondary | ICD-10-CM

## 2019-12-24 DIAGNOSIS — L89890 Pressure ulcer of other site, unstageable: Secondary | ICD-10-CM | POA: Insufficient documentation

## 2019-12-24 DIAGNOSIS — I480 Paroxysmal atrial fibrillation: Secondary | ICD-10-CM | POA: Diagnosis not present

## 2019-12-24 DIAGNOSIS — E1169 Type 2 diabetes mellitus with other specified complication: Secondary | ICD-10-CM | POA: Insufficient documentation

## 2019-12-24 DIAGNOSIS — N185 Chronic kidney disease, stage 5: Secondary | ICD-10-CM | POA: Diagnosis not present

## 2019-12-24 DIAGNOSIS — F5101 Primary insomnia: Secondary | ICD-10-CM

## 2019-12-24 DIAGNOSIS — L89300 Pressure ulcer of unspecified buttock, unstageable: Secondary | ICD-10-CM | POA: Insufficient documentation

## 2019-12-24 DIAGNOSIS — D631 Anemia in chronic kidney disease: Secondary | ICD-10-CM

## 2019-12-24 DIAGNOSIS — M1A30X0 Chronic gout due to renal impairment, unspecified site, without tophus (tophi): Secondary | ICD-10-CM | POA: Diagnosis not present

## 2019-12-24 DIAGNOSIS — E039 Hypothyroidism, unspecified: Secondary | ICD-10-CM | POA: Diagnosis not present

## 2019-12-24 DIAGNOSIS — I132 Hypertensive heart and chronic kidney disease with heart failure and with stage 5 chronic kidney disease, or end stage renal disease: Secondary | ICD-10-CM

## 2019-12-24 DIAGNOSIS — I5032 Chronic diastolic (congestive) heart failure: Secondary | ICD-10-CM

## 2019-12-24 DIAGNOSIS — M8949 Other hypertrophic osteoarthropathy, multiple sites: Secondary | ICD-10-CM

## 2019-12-24 DIAGNOSIS — E1122 Type 2 diabetes mellitus with diabetic chronic kidney disease: Secondary | ICD-10-CM | POA: Diagnosis not present

## 2019-12-24 DIAGNOSIS — D696 Thrombocytopenia, unspecified: Secondary | ICD-10-CM

## 2019-12-24 DIAGNOSIS — K5909 Other constipation: Secondary | ICD-10-CM

## 2019-12-24 DIAGNOSIS — I272 Pulmonary hypertension, unspecified: Secondary | ICD-10-CM

## 2019-12-24 MED ORDER — TEMAZEPAM 30 MG PO CAPS: ORAL_CAPSULE | ORAL | 0 refills | Status: DC

## 2019-12-24 MED ORDER — TRAMADOL HCL 50 MG PO TABS: 100.0000 mg | ORAL_TABLET | Freq: Four times a day (QID) | ORAL | 0 refills | Status: DC | PRN

## 2019-12-24 NOTE — Progress Notes (Signed)
Location:    [place] Room Number: 158/P Place of Service:  SNF (31)   CODE STATUS: Full Code  Allergies  Allergen Reactions   Penicillins Hives    Pt was 84 years old  Has patient had a PCN reaction causing immediate rash, facial/tongue/throat swelling, SOB or lightheadedness with hypotension: Unknown Has patient had a PCN reaction causing severe rash involving mucus membranes or skin necrosis: Unknown Has patient had a PCN reaction that required hospitalization: No Has patient had a PCN reaction occurring within the last 10 years: No If all of the above answers are "NO", then may proceed with Cephalosporin use.     Chief Complaint  Patient presents with   Hospitalization Follow-up    Hospitalization Follow Up Visit    HPI:  He is a 84 year old man who has been hospitalized from 11-18-19 through 01/12/2020. He was originally admitted after fall. Was treated for UTI; acute on chronic renal failure; covid 19. He is here for short term rehab with his goal to return back home. He is not on dialysis at this time; did require while in the hospital. There are no reports of uncontrolled pain; no poor appetite; no reports of anxiety or agitation. He will continue to be followed for chf; afib renal failure.    Past Medical History:  Diagnosis Date   Anemia 01/02/2012   Anemia associated with chronic renal failure    Aortic stenosis    Atrial fibrillation (HCC)    CAD (coronary artery disease)    CHF (congestive heart failure) ([place])    sees Dr. Glori Bickers    Chronic kidney disease     sees Dr. Jamal Maes    CVA (cerebral infarction) 05/2010   Diabetes mellitus    Gout    Hyperlipidemia    Hypertension    Myocardial infarction [facility])    Obesity    Shoulder pain, left    sees Dr. Nickola Major at [facility] in [place]    Splenomegaly 2012   Thrombocytopenia [facility] ) 2011   sees Dr. Lamonte Sakai   Thrombocytopenia [facility])      Past Surgical History:  Procedure Laterality Date   AORTIC VALVE REPLACEMENT     Pericardial tissue valve   APPENDECTOMY     bone spurs     from right heel   CARDIOVERSION N/A 01/13/2017   Procedure: CARDIOVERSION;  Surgeon: Larey Dresser, MD;  Location: [place];  Service: Cardiovascular;  Laterality: N/A;   CARDIOVERSION N/A 05/01/2017   Procedure: CARDIOVERSION;  Surgeon: Jolaine Artist, MD;  Location: [facility] ENDOSCOPY;  Service: Cardiovascular;  Laterality: N/A;   CORONARY ARTERY BYPASS GRAFT     June 2 011 Limited the LAD, SVG to OM, SVG to PDA   KIDNEY TRANSPLANT  01-20-12   at [place] CATH N/A 04/28/2017   Procedure: Right Heart Cath;  Surgeon: Jolaine Artist, MD;  Location: [place] CV LAB;  Service: Cardiovascular;  Laterality: N/A;   TEE WITHOUT CARDIOVERSION N/A 05/01/2017   Procedure: TRANSESOPHAGEAL ECHOCARDIOGRAM (TEE);  Surgeon: Jolaine Artist, MD;  Location: [facility] ENDOSCOPY;  Service: Cardiovascular;  Laterality: N/A;    Social History   Socioeconomic History   Marital status: Widowed    Spouse name: Not on file   Number of children: Not on file   Years of education: Not on file   Highest education level: Not on file  Occupational History   Not on file  Tobacco Use   Smoking status: Never Smoker   Smokeless tobacco: Never Used  Substance and Sexual Activity   Alcohol use: No    Alcohol/week: 0.0 standard drinks   Drug use: No   Sexual activity: Not on file  Other Topics Concern   Not on file  Social History Narrative   Not on file   Social Determinants of Health   Financial Resource Strain:    Difficulty of Paying Living Expenses: Not on file  Food Insecurity:    Worried About [place] in the Last Year: Not on file   Ran Out of Food in the Last Year: Not on file  Transportation Needs:    Lack of Transportation (Medical): Not on file   Lack of Transportation (Non-Medical):  Not on file  Physical Activity:    Days of Exercise per Week: Not on file   Minutes of Exercise per Session: Not on file  Stress:    Feeling of Stress : Not on file  Social Connections:    Frequency of Communication with Friends and Family: Not on file   Frequency of Social Gatherings with Friends and Family: Not on file   Attends Religious Services: Not on file   Active Member of Clubs or Organizations: Not on file   Attends Archivist Meetings: Not on file   Marital Status: Not on file  Intimate Partner Violence:    Fear of Current or Ex-Partner: Not on file   Emotionally Abused: Not on file   Physically Abused: Not on file   Sexually Abused: Not on file   Family History  Problem Relation Age of Onset   Liver disease Father    Cancer Unknown        colon/fhx   Hypertension Unknown        fhx   Coronary artery disease Unknown        fhx      VITAL SIGNS Pulse 68    Temp 97.8 F (36.6 C) (Oral)    Resp 20    Ht 5\' 11"  (1.803 m)    Wt 234 lb 14.4 oz (106.5 kg)    BMI 32.76 kg/m   Outpatient Encounter Medications as of 12/24/2019  Medication Sig   allopurinol (ZYLOPRIM) 300 MG tablet Take 1 tablet (300 mg total) by mouth daily.   amiodarone (PACERONE) 200 MG tablet Take 1 tablet (200 mg total) by mouth daily. Please call for an office visit [phone]   apixaban (ELIQUIS) 2.5 MG TABS tablet Take 1 tablet (2.5 mg total) by mouth 2 (two) times daily.   aspirin 81 MG tablet Take 81 mg by mouth daily.   azelastine (ASTELIN) 0.1 % nasal spray Place 1 spray into both nostrils 2 (two) times daily. Use in each nostril as directed   calcium carbonate (TUMS - DOSED IN MG ELEMENTAL CALCIUM) 500 MG chewable tablet Chew 2 tablets by mouth daily as needed. As needed for heartburn.   docusate sodium (COLACE) 100 MG capsule Take 100 mg by mouth at bedtime.   furosemide (LASIX) 40 MG tablet Take 40 mg by mouth daily.    insulin aspart (NOVOLOG FLEXPEN)  100 UNIT/ML FlexPen Insulin pen; 100 unit/mL (3 mL); amt: Per Sliding Scale;  If Blood Sugar is less than 60, call MD. If Blood Sugar is 150 to 200, give 2 Units. If Blood Sugar is 201 to 250, give 3 Units. If Blood Sugar is 251 to 300, give 5 Units. If Blood  Sugar is 301 to 350, give 6 Units. If Blood Sugar is 351 to 400, give 7 Units. If Blood Sugar is greater than 400, give 8 Units. If Blood Sugar is greater than 400, call MD. subcutaneous  Three Times A Day   insulin glargine (LANTUS SOLOSTAR) 100 UNIT/ML Solostar Pen Inject 7 Units into the skin at bedtime.   iron polysaccharides (NIFEREX) 150 MG capsule Take 150 mg by mouth every other day.   Krill Oil 1000 MG CAPS Take 1,000 mg by mouth daily at 3 pm.    levothyroxine (SYNTHROID) 75 MCG tablet Take 75 mcg by mouth daily.   magnesium oxide (MAG-OX) 400 MG tablet Take 400 mg by mouth 2 (two) times daily.   metoprolol tartrate (LOPRESSOR) 25 MG tablet Take 25 mg by mouth 2 (two) times daily. Give with a meal/food.   mycophenolate (MYFORTIC) 180 MG EC tablet Take 1 tablet (180 mg total) by mouth 2 (two) times daily.   NON FORMULARY Diet: _____ Regular,  ______ NAS,  ___x____Consistent Carbohydrate,  _______NPO  _____Other   omeprazole (PRILOSEC) 20 MG capsule Take 20 mg by mouth at bedtime.    polyethylene glycol powder (GLYCOLAX/MIRALAX) powder Take 17 g by mouth daily.   sevelamer carbonate (RENVELA) 800 MG tablet Take 800 mg by mouth 3 (three) times daily with meals. Give with meals.   simvastatin (ZOCOR) 10 MG tablet Take 1 tablet (10 mg total) by mouth at bedtime.   sulfamethoxazole-trimethoprim (BACTRIM,SEPTRA) 400-80 MG tablet Take 1 tablet by mouth every Monday, Wednesday, and Friday. Takes for kidney rejection   tacrolimus (PROGRAF) 0.5 MG capsule Take 0.5 mg by mouth daily.   tacrolimus (PROGRAF) 1 MG capsule Take 1 mg by mouth every evening.    temazepam (RESTORIL) 30 MG capsule TAKE 1 CAPSULE BY MOUTH  EVERYDAY AT BEDTIME   traMADol (ULTRAM) 50 MG tablet TAKE 2 TABLETS (100 MG TOTAL) BY MOUTH EVERY 6 (SIX) HOURS AS NEEDED.   No facility-administered encounter medications on file as of 12/24/2019.     SIGNIFICANT DIAGNOSTIC EXAMS  TODAY  12-05-19: ct of chest abdomen and pelvis  1. No acute traumatic injury of the chest, abdomen, or pelvis. 2. Cardiomegaly with interstitial edema and bilateral pleural effusions, RIGHT greater than LEFT. 3. Consolidative airspace disease of the RIGHT lower lobe which likely reflects combination of atelectasis and pneumonia. 4. Borderline to enlarged mediastinal lymph nodes, likely reactive in the setting of volume overload and possible RIGHT lower lobe pneumonia. 5. Sequelae of volume overload with anasarca, ascites, and mesenteric edema. 6. Increased size of pancreatic head cyst, measuring up to 3.8 cm. Recommend nonemergent MRI.Marland Kitchen   LABS REVIEWED TODAY;   12-08-19: urine culture: e-coli 12-20-19: wbc 6.8; hgb 8.2; hct 25.2 mcv 90.4 plt 95 01/11/2020: glucose 123 bun 67; creat 3.25; k+ 5.5; na++ 130; ca 7.9    Review of Systems  Constitutional: Negative for malaise/fatigue.  Respiratory: Negative for cough and shortness of breath.   Cardiovascular: Negative for chest pain, palpitations and leg swelling.  Gastrointestinal: Negative for abdominal pain, constipation and heartburn.  Musculoskeletal: Negative for back pain, joint pain and myalgias.  Skin:       Sores   Neurological: Negative for dizziness.  Psychiatric/Behavioral: The patient is not nervous/anxious.     Physical Exam Constitutional:      General: He is not in acute distress.    Appearance: He is well-developed. He is obese. He is not diaphoretic.  Neck:     Thyroid: No  thyromegaly.  Cardiovascular:     Rate and Rhythm: Normal rate. Rhythm irregular.     Heart sounds: Murmur present.     Comments: Unable to palpate pedal/post tib pulses  2/6 History of CABG and AVR Pulmonary:      Effort: Pulmonary effort is normal. No respiratory distress.     Breath sounds: Normal breath sounds.  Abdominal:     General: Bowel sounds are normal. There is no distension.     Palpations: Abdomen is soft.     Tenderness: There is no abdominal tenderness.  Genitourinary:    Comments: 2013: renal transplant Musculoskeletal:     Cervical back: Neck supple.     Right lower leg: No edema.     Left lower leg: No edema.     Comments: Is able to move all extremities   Lymphadenopathy:     Cervical: No cervical adenopathy.  Skin:    General: Skin is warm and dry.     Comments: Coccyx/left buttock: unstaged: 4.0 x 5.0 x 1.0 cm 95% soft adherent eschar 5% granulation   Right lateral foot: unstaged: 1.5 x 1.8 cm: 100% eschar  Left lateral heel: DTI: 2.0 x 1.5 cm  Right posterior heel: DTI: 2.5 x 1.0 cm  Has bruising present   Neurological:     Mental Status: He is alert. Mental status is at baseline.  Psychiatric:        Mood and Affect: Mood normal.      ASSESSMENT/ PLAN:  TODAY  1. Chronic diastolic congestive heart failure: is stable will continue lasix 40 mg daily   2. Paroxysmal atrial fibrillation: heart rate stable: will continue amiodarone 200 mg daily lopressor 25 mg twice daily for rate control and eliquis 2.5 mg twice daily   3. Hypertensive heart disease and kidney disease with chronic diastolic congestive heart failure stage 5 chronic kidney disease without dialysis/pulmonary HTN./s/p aortic valve replacement:   Is stable will continue lopressor 25 mg twice daily asa 81 mg daily   4.  Gout due to renal impairment without tophi unspecified site: is stable will continue allopurinol 300 mg daily   5. Type 2 diabetes mellitus with stage 5 chronic kidney disease and hypertension: is stable will continue lantus 7 units nightly novolog SSI: 150-200: 2 units; 201-250: 3 units; 251-300: 5 units; 301-350 6 units; 351-400: 7 units >400 8 units. Is on asa statin  6.  Dyslipidemia associated with type 2 diabetes mellitus is stable will continue zocor 10 mg daily   7. CKD stage 5 due to type 2 diabetes mellitus: is without change; bun 67 creat 3.25 will continue renvela 800 mg three times daily   8. Acquired hypothyroidism: is stable will continue synthroid 75 mcg daily   9. Renal transplant recipient: is without change: will continue myfortic 180 mgm twice daily and prograf 0.5 mg in the AM and 1 mg in the PM. Will continue septra DS three times weekly   10. Thrombocytopenia: plt 95 will monitor   11. Anemia due to CKD stage 5: is stable hgb 8.2 will continue niferex 150 mg every other day  12. Hypomagnesemia: is stable will continue mag ox 400 mg twice daily   13. Chronic non-seasonal allergic rhinitis: is stable will continue azelastine twice daily   14. Pressure injury of left buttock unstage pressure injury right foot: is stable will continue current treatment and will monitor  15. GERD without esophagitis: is stable will continue prilosec 20 mg daily   16.  Chronic constipation: is stable will continue miralax daily colace daily   17. Coronary artery disease of bypass graft of native heart with stable angina: is stable will continue asa 81 mg daily lopressor 25 mg twice daily   18. Primary osteoarthritis involving multiple joints: is stable will continue ultram 100 mg every 6 hours as needed through 12-31-19  19. Primary insomnia: is stable will continue restoril 30 mg nightly    Will check cbc; cmp; lipids; hgb a1c tsh mag    MD is aware of resident's narcotic use and is in agreement with current plan of care. We will attempt to wean resident as appropriate.  Ok Edwards NP [facility]  Contact [phone] Monday through Friday 8am- 5pm  After hours call [phone]

## 2019-12-27 ENCOUNTER — Encounter: Payer: Self-pay | Admitting: Adult Health

## 2019-12-27 ENCOUNTER — Encounter (HOSPITAL_COMMUNITY)
Admission: RE | Admit: 2019-12-27 | Discharge: 2019-12-27 | Disposition: A | Discharge status: Home / Self Care | Payer: Medicare Other | Source: Skilled Nursing Facility | Attending: Adult Health | Admitting: Adult Health

## 2019-12-27 ENCOUNTER — Non-Acute Institutional Stay (SKILLED_NURSING_FACILITY): Payer: Medicare Other | Admitting: Adult Health

## 2019-12-27 DIAGNOSIS — L89320 Pressure ulcer of left buttock, unstageable: Secondary | ICD-10-CM | POA: Diagnosis not present

## 2019-12-27 DIAGNOSIS — I1311 Hypertensive heart and chronic kidney disease without heart failure, with stage 5 chronic kidney disease, or end stage renal disease: Secondary | ICD-10-CM | POA: Diagnosis not present

## 2019-12-27 DIAGNOSIS — E1122 Type 2 diabetes mellitus with diabetic chronic kidney disease: Secondary | ICD-10-CM

## 2019-12-27 DIAGNOSIS — I5032 Chronic diastolic (congestive) heart failure: Secondary | ICD-10-CM | POA: Diagnosis not present

## 2019-12-27 DIAGNOSIS — N185 Chronic kidney disease, stage 5: Secondary | ICD-10-CM

## 2019-12-27 DIAGNOSIS — I120 Hypertensive chronic kidney disease with stage 5 chronic kidney disease or end stage renal disease: Secondary | ICD-10-CM | POA: Diagnosis not present

## 2019-12-27 DIAGNOSIS — E875 Hyperkalemia: Secondary | ICD-10-CM | POA: Insufficient documentation

## 2019-12-27 DIAGNOSIS — I132 Hypertensive heart and chronic kidney disease with heart failure and with stage 5 chronic kidney disease, or end stage renal disease: Secondary | ICD-10-CM

## 2019-12-27 LAB — COMPREHENSIVE METABOLIC PANEL
ALT: 24 U/L (ref 0–44)
AST: 32 U/L (ref 15–41)
Albumin: 2.8 g/dL — ABNORMAL LOW (ref 3.5–5.0)
Alkaline Phosphatase: 104 U/L (ref 38–126)
Anion gap: 9 (ref 5–15)
BUN: 86 mg/dL — ABNORMAL HIGH (ref 8–23)
CO2: 27 mmol/L (ref 22–32)
Calcium: 7.6 mg/dL — ABNORMAL LOW (ref 8.9–10.3)
Chloride: 94 mmol/L — ABNORMAL LOW (ref 98–111)
Creatinine, Ser: 4.61 mg/dL — ABNORMAL HIGH (ref 0.61–1.24)
GFR calc Af Amer: 13 mL/min — ABNORMAL LOW (ref >60)
GFR calc non Af Amer: 11 mL/min — ABNORMAL LOW (ref >60)
Glucose, Bld: 146 mg/dL — ABNORMAL HIGH (ref 70–99)
Potassium: 5.8 mmol/L — ABNORMAL HIGH (ref 3.5–5.1)
Sodium: 130 mmol/L — ABNORMAL LOW (ref 135–145)
Total Bilirubin: 0.8 mg/dL (ref 0.3–1.2)
Total Protein: 6.5 g/dL (ref 6.5–8.1)

## 2019-12-27 LAB — CBC
HCT: 29.4 % — ABNORMAL LOW (ref 39.0–52.0)
Hemoglobin: 8.6 g/dL — ABNORMAL LOW (ref 13.0–17.0)
MCH: 28.8 pg (ref 26.0–34.0)
MCHC: 29.3 g/dL — ABNORMAL LOW (ref 30.0–36.0)
MCV: 98.3 fL (ref 80.0–100.0)
Platelets: 100 10*3/uL — ABNORMAL LOW (ref 150–400)
RBC: 2.99 MIL/uL — ABNORMAL LOW (ref 4.22–5.81)
RDW: 18.5 % — ABNORMAL HIGH (ref 11.5–15.5)
WBC: 6 10*3/uL (ref 4.0–10.5)
nRBC: 0 % (ref 0.0–0.2)

## 2019-12-27 LAB — LIPID PANEL
Cholesterol: 118 mg/dL (ref 0–200)
HDL: 47 mg/dL (ref >40)
LDL Cholesterol: 52 mg/dL (ref 0–99)
Total CHOL/HDL Ratio: 2.5 RATIO
Triglycerides: 94 mg/dL (ref <150)
VLDL: 19 mg/dL (ref 0–40)

## 2019-12-27 LAB — HEMOGLOBIN A1C
Hgb A1c MFr Bld: 5.6 % (ref 4.8–5.6)
Mean Plasma Glucose: 114.02 mg/dL

## 2019-12-27 LAB — MAGNESIUM: Magnesium: 4 mg/dL — ABNORMAL HIGH (ref 1.7–2.4)

## 2019-12-27 LAB — TSH: TSH: 9.928 u[IU]/mL — ABNORMAL HIGH (ref 0.350–4.500)

## 2019-12-27 NOTE — Progress Notes (Addendum)
Location:    [place] Room Number: 158/P Place of Service:  SNF (31)   CODE STATUS: Full Code  Allergies  Allergen Reactions  . Penicillins Hives    Pt was 84 years old  Has patient had a PCN reaction causing immediate rash, facial/tongue/throat swelling, SOB or lightheadedness with hypotension: Unknown Has patient had a PCN reaction causing severe rash involving mucus membranes or skin necrosis: Unknown Has patient had a PCN reaction that required hospitalization: No Has patient had a PCN reaction occurring within the last 10 years: No If all of the above answers are "NO", then may proceed with Cephalosporin use.     Chief Complaint  Patient presents with  . Acute Visit    72 hour care plan      HPI:  He has been admitted to this facility for short term rehab. He lives alone; has one step and one level. He has all needed dme. His goal is to return back home.  He was not on 02 prior to his hospitalization. Will need to be weaned off 02 as tolerated. He does have pain and is taking tramadol for his pain. No reports of cough or shortness of breath. He continue to be followed for his chronic illnesses including: hypertensive heart disease; diabetes; left buttock ulceration.  She is having foul mucoid stools. No reports of abdominal pain or nausea or vomiting.   Past Medical History:  Diagnosis Date  . Anemia 01/02/2012  . Anemia associated with chronic renal failure   . Aortic stenosis   . Atrial fibrillation ([place])   . CAD (coronary artery disease)   . CHF (congestive heart failure) ([place])    sees Dr. Glori Bickers   . Chronic kidney disease     sees Dr. Jamal Maes   . CVA (cerebral infarction) 05/2010  . Degenerative joint disease involving multiple joints 04/13/2007   Qualifier: Diagnosis of  By: Sarajane Jews MD, Ishmael Holter   . Diabetes mellitus   . Gout   . Hyperlipidemia   . Hypertension   . Myocardial infarction ([place])   . Obesity   . Shoulder  pain, left    sees Dr. Nickola Major at [facility] in [place]   . Splenomegaly 2012  . Thrombocytopenia ([place]) 2011   sees Dr. Lamonte Sakai  . Thrombocytopenia ([place])     Past Surgical History:  Procedure Laterality Date  . AORTIC VALVE REPLACEMENT     Pericardial tissue valve  . APPENDECTOMY    . bone spurs     from right heel  . CARDIOVERSION N/A 01/13/2017   Procedure: CARDIOVERSION;  Surgeon: Larey Dresser, MD;  Location: [place];  Service: Cardiovascular;  Laterality: N/A;  . CARDIOVERSION N/A 05/01/2017   Procedure: CARDIOVERSION;  Surgeon: Jolaine Artist, MD;  Location: [facility] ENDOSCOPY;  Service: Cardiovascular;  Laterality: N/A;  . CORONARY ARTERY BYPASS GRAFT     June 2 011 Limited the LAD, SVG to OM, SVG to PDA  . KIDNEY TRANSPLANT  01-20-12   at [place] N/A 04/28/2017   Procedure: Right Heart Cath;  Surgeon: Jolaine Artist, MD;  Location: [place] CV LAB;  Service: Cardiovascular;  Laterality: N/A;  . TEE WITHOUT CARDIOVERSION N/A 05/01/2017   Procedure: TRANSESOPHAGEAL ECHOCARDIOGRAM (TEE);  Surgeon: Jolaine Artist, MD;  Location: [facility] ENDOSCOPY;  Service: Cardiovascular;  Laterality: N/A;    Social History   Socioeconomic History  . Marital status: Widowed  Spouse name: Not on file  . Number of children: Not on file  . Years of education: Not on file  . Highest education level: Not on file  Occupational History  . Not on file  Tobacco Use  . Smoking status: Never Smoker  . Smokeless tobacco: Never Used  Substance and Sexual Activity  . Alcohol use: No    Alcohol/week: 0.0 standard drinks  . Drug use: No  . Sexual activity: Not on file  Other Topics Concern  . Not on file  Social History Narrative  . Not on file   Social Determinants of Health   Financial Resource Strain:   . Difficulty of Paying Living Expenses: Not on file  Food Insecurity:   . Worried About Charity fundraiser in the Last Year: Not  on file  . Ran Out of Food in the Last Year: Not on file  Transportation Needs:   . Lack of Transportation (Medical): Not on file  . Lack of Transportation (Non-Medical): Not on file  Physical Activity:   . Days of Exercise per Week: Not on file  . Minutes of Exercise per Session: Not on file  Stress:   . Feeling of Stress : Not on file  Social Connections:   . Frequency of Communication with Friends and Family: Not on file  . Frequency of Social Gatherings with Friends and Family: Not on file  . Attends Religious Services: Not on file  . Active Member of Clubs or Organizations: Not on file  . Attends Archivist Meetings: Not on file  . Marital Status: Not on file  Intimate Partner Violence:   . Fear of Current or Ex-Partner: Not on file  . Emotionally Abused: Not on file  . Physically Abused: Not on file  . Sexually Abused: Not on file   Family History  Problem Relation Age of Onset  . Liver disease Father   . Cancer Unknown        colon/fhx  . Hypertension Unknown        fhx  . Coronary artery disease Unknown        fhx      VITAL SIGNS BP 140/72   Pulse 79   Temp 98 F (36.7 C) (Oral)   Resp 20   Ht 5\' 11"  (1.803 m)   Wt 234 lb 14.4 oz (106.5 kg)   BMI 32.76 kg/m   Outpatient Encounter Medications as of 12/27/2019  Medication Sig  . allopurinol (ZYLOPRIM) 100 MG tablet Take 100 mg by mouth daily.  . Amino Acids-Protein Hydrolys (FEEDING SUPPLEMENT, PRO-STAT SUGAR FREE 64,) LIQD Take 30 mLs by mouth 3 (three) times daily with meals.  Marland Kitchen amiodarone (PACERONE) 200 MG tablet Take 1 tablet (200 mg total) by mouth daily. Please call for an office visit [phone]  . apixaban (ELIQUIS) 2.5 MG TABS tablet Take 1 tablet (2.5 mg total) by mouth 2 (two) times daily.  Marland Kitchen aspirin 81 MG tablet Take 81 mg by mouth daily.  Marland Kitchen azelastine (ASTELIN) 0.1 % nasal spray Place 1 spray into both nostrils 2 (two) times daily. Use in each nostril as directed  . calcium  carbonate (TUMS - DOSED IN MG ELEMENTAL CALCIUM) 500 MG chewable tablet Chew 2 tablets by mouth daily as needed. As needed for heartburn.  . collagenase (SANTYL) ointment Apply 1 application topically daily. Special Instructions: Apply to coccyx/left buttock wound per tx orders.  . collagenase (SANTYL) ointment Apply 1 application topically daily. Special Instructions: Apply  to wound right lateral foot per tx orders.  . diphenhydrAMINE HCl (ZZZQUIL) 50 MG/30ML LIQD Give 30 ml by mouth at bedtime for chronic insomnia  . docusate sodium (COLACE) 100 MG capsule Take 100 mg by mouth at bedtime.  . furosemide (LASIX) 40 MG tablet Take 40 mg by mouth daily.   . insulin aspart (NOVOLOG FLEXPEN) 100 UNIT/ML FlexPen Insulin pen; 100 unit/mL (3 mL); amt: Per Sliding Scale;  If Blood Sugar is less than 60, call MD. If Blood Sugar is 150 to 200, give 2 Units. If Blood Sugar is 201 to 250, give 3 Units. If Blood Sugar is 251 to 300, give 5 Units. If Blood Sugar is 301 to 350, give 6 Units. If Blood Sugar is 351 to 400, give 7 Units. If Blood Sugar is greater than 400, give 8 Units. If Blood Sugar is greater than 400, call MD. subcutaneous  Three Times A Day  . insulin glargine (LANTUS SOLOSTAR) 100 UNIT/ML Solostar Pen Inject 7 Units into the skin at bedtime.  . iron polysaccharides (NIFEREX) 150 MG capsule Take 150 mg by mouth every other day.  Marland Kitchen KRILL OIL OMEGA-3 PO Megared superior krill oil with 90 mg omega 3 fatty acidscapsule; oral  Once An Evening 05:00 PM  . levothyroxine (SYNTHROID) 100 MCG tablet Take 100 mcg by mouth daily before breakfast.  . metoprolol tartrate (LOPRESSOR) 25 MG tablet Take 12.5 mg by mouth 2 (two) times daily. Give with a meal/food.   . mycophenolate (MYFORTIC) 180 MG EC tablet Take 1 tablet (180 mg total) by mouth 2 (two) times daily.  . NON FORMULARY Diet: _____ Regular,  ______ NAS,  ___x____Consistent Carbohydrate,  _______NPO  _____Other  . omeprazole (PRILOSEC)  20 MG capsule Take 20 mg by mouth at bedtime.   . polyethylene glycol powder (GLYCOLAX/MIRALAX) powder Take 17 g by mouth daily.  . sevelamer carbonate (RENVELA) 800 MG tablet Take 800 mg by mouth 3 (three) times daily with meals. Give with meals.  . simvastatin (ZOCOR) 10 MG tablet Take 1 tablet (10 mg total) by mouth at bedtime.  . sulfamethoxazole-trimethoprim (BACTRIM,SEPTRA) 400-80 MG tablet Take 1 tablet by mouth every Monday, Wednesday, and Friday. Takes for kidney rejection  . tacrolimus (PROGRAF) 0.5 MG capsule Take 0.5 mg by mouth daily.  . tacrolimus (PROGRAF) 1 MG capsule Take 1 mg by mouth every evening.   . temazepam (RESTORIL) 30 MG capsule TAKE 1 CAPSULE BY MOUTH EVERYDAY AT BEDTIME  . traMADol (ULTRAM) 50 MG tablet Take 2 tablets (100 mg total) by mouth every 6 (six) hours as needed for up to 7 days.  . [DISCONTINUED] allopurinol (ZYLOPRIM) 300 MG tablet Take 1 tablet (300 mg total) by mouth daily.  . [DISCONTINUED] Krill Oil 1000 MG CAPS Take 1,000 mg by mouth daily at 3 pm.   . [DISCONTINUED] levothyroxine (SYNTHROID) 75 MCG tablet Take 75 mcg by mouth daily.  . [DISCONTINUED] magnesium oxide (MAG-OX) 400 MG tablet Take 400 mg by mouth 2 (two) times daily.   No facility-administered encounter medications on file as of 12/27/2019.     SIGNIFICANT DIAGNOSTIC EXAMS  PREVIOUS  12-05-19: ct of chest abdomen and pelvis  1. No acute traumatic injury of the chest, abdomen, or pelvis. 2. Cardiomegaly with interstitial edema and bilateral pleural effusions, RIGHT greater than LEFT. 3. Consolidative airspace disease of the RIGHT lower lobe which likely reflects combination of atelectasis and pneumonia. 4. Borderline to enlarged mediastinal lymph nodes, likely reactive in the setting of volume  overload and possible RIGHT lower lobe pneumonia. 5. Sequelae of volume overload with anasarca, ascites, and mesenteric edema. 6. Increased size of pancreatic head cyst, measuring up to  3.8 cm. Recommend nonemergent MRI.Marland Kitchen   NO NEW EXAMS   LABS REVIEWED PREVIOUS   12-08-19: urine culture: e-coli 12-20-19: wbc 6.8; hgb 8.2; hct 25.2 mcv 90.4 plt 95 12/30/2019: glucose 123 bun 67; creat 3.25; k+ 5.5; na++ 130; ca 7.9  NO NEW LABS.   Review of Systems  Constitutional: Negative for malaise/fatigue.  Respiratory: Negative for cough and shortness of breath.   Cardiovascular: Negative for chest pain, palpitations and leg swelling.  Gastrointestinal: Negative for abdominal pain, constipation and heartburn.  Musculoskeletal: Negative for back pain, joint pain and myalgias.  Skin:       sores  Neurological: Negative for dizziness.  Psychiatric/Behavioral: The patient is not nervous/anxious.     Physical Exam Constitutional:      General: He is not in acute distress.    Appearance: He is well-developed. He is obese. He is not diaphoretic.  Neck:     Thyroid: No thyromegaly.  Cardiovascular:     Rate and Rhythm: Normal rate. Rhythm irregular.     Heart sounds: Normal heart sounds.     Comments: Unable to palpate pedal/post tib pulses  2/6 History of CABG and AVR Pulmonary:     Effort: Pulmonary effort is normal. No respiratory distress.     Breath sounds: Normal breath sounds.  Abdominal:     General: Bowel sounds are normal. There is no distension.     Palpations: Abdomen is soft.     Tenderness: There is no abdominal tenderness.  Genitourinary:    Comments: 2013: renal transplant Musculoskeletal:     Cervical back: Neck supple.     Right lower leg: Edema present.     Left lower leg: Edema present.     Comments: Has mild generalized edema Is able to move all extremities   Lymphadenopathy:     Cervical: No cervical adenopathy.  Skin:    General: Skin is warm and dry.     Comments: Coccyx/left buttock: unstaged: 4.0 x 5.0 x 1.0 cm 95% soft adherent eschar 5% granulation   Right lateral foot: unstaged: 1.5 x 1.8 cm: 100% eschar  Left lateral heel: DTI: 2.0 x 1.5  cm  Right posterior heel: DTI: 2.5 x 1.0 cm Has bruising present   Neurological:     Mental Status: He is alert and oriented to person, place, and time.  Psychiatric:        Mood and Affect: Mood normal.      ASSESSMENT/ PLAN:  TODAY  1. Hypertensive heart and kidney disease with chronic diastolic congestive heart failure and stage 5 chronic kidney disease not on chronic dialysis 2. Pressure ulcer left buttock unstaged 3. Type 2 diabetes mellitus with stage 5 chronic kidney disease and hypertension 4. c-diff infection   Will begin vancomycin 125 mg every 6 ours through 01/28/20.  Will continue therapy as directed Will continue current medications Will continue to monitor his status Goal is to return back home Will not need dme.   MD is aware of resident's narcotic use and is in agreement with current plan of care. We will attempt to wean resident as appropriate.  Ok Edwards NP [facility]  Contact [phone] Monday through Friday 8am- 5pm  After hours call [phone]

## 2019-12-28 ENCOUNTER — Other Ambulatory Visit: Payer: Self-pay | Admitting: *Deleted

## 2019-12-28 NOTE — Patient Outreach (Signed)
Screened for potential [facility] Care Management needs as a benefit of  [employer].  Member is currently receiving skilled therapy at [facility].  Writer attended telephonic interdisciplinary team meeting to assess for disposition needs and transition plan for resident.   Facility reports member previously had extended hospitalization. He is receiving wound care. Lived alone prior. Has supportive family. Primary contact is daughter Claiborne Billings who works at [employer].   Will continue to follow for transition plans and for potential [facility] Care Management needs while member resides in SNF.    Marthenia Rolling, MSN-Ed, RN,BSN [place] Acute Care Coordinator [phone] [facility]) [phone]  (Toll free office)

## 2019-12-29 ENCOUNTER — Encounter: Payer: Self-pay | Admitting: Adult Health

## 2019-12-29 ENCOUNTER — Encounter: Payer: Self-pay | Admitting: Internal Medicine

## 2019-12-29 ENCOUNTER — Encounter (HOSPITAL_COMMUNITY)
Admission: RE | Admit: 2019-12-29 | Discharge: 2019-12-29 | Disposition: A | Discharge status: Home / Self Care | Payer: Medicare Other | Source: Skilled Nursing Facility | Attending: *Deleted | Admitting: *Deleted

## 2019-12-29 ENCOUNTER — Non-Acute Institutional Stay (SKILLED_NURSING_FACILITY): Payer: Medicare Other | Admitting: Internal Medicine

## 2019-12-29 ENCOUNTER — Other Ambulatory Visit: Payer: Self-pay | Admitting: Adult Health

## 2019-12-29 DIAGNOSIS — Z940 Kidney transplant status: Secondary | ICD-10-CM

## 2019-12-29 DIAGNOSIS — I1311 Hypertensive heart and chronic kidney disease without heart failure, with stage 5 chronic kidney disease, or end stage renal disease: Secondary | ICD-10-CM | POA: Diagnosis not present

## 2019-12-29 DIAGNOSIS — I120 Hypertensive chronic kidney disease with stage 5 chronic kidney disease or end stage renal disease: Secondary | ICD-10-CM | POA: Diagnosis not present

## 2019-12-29 DIAGNOSIS — U071 COVID-19: Secondary | ICD-10-CM | POA: Diagnosis not present

## 2019-12-29 DIAGNOSIS — E1122 Type 2 diabetes mellitus with diabetic chronic kidney disease: Secondary | ICD-10-CM | POA: Diagnosis not present

## 2019-12-29 DIAGNOSIS — I480 Paroxysmal atrial fibrillation: Secondary | ICD-10-CM | POA: Diagnosis not present

## 2019-12-29 DIAGNOSIS — E875 Hyperkalemia: Secondary | ICD-10-CM | POA: Diagnosis not present

## 2019-12-29 DIAGNOSIS — K219 Gastro-esophageal reflux disease without esophagitis: Secondary | ICD-10-CM | POA: Diagnosis not present

## 2019-12-29 DIAGNOSIS — N189 Chronic kidney disease, unspecified: Secondary | ICD-10-CM | POA: Diagnosis not present

## 2019-12-29 DIAGNOSIS — N185 Chronic kidney disease, stage 5: Secondary | ICD-10-CM

## 2019-12-29 DIAGNOSIS — N186 End stage renal disease: Secondary | ICD-10-CM | POA: Diagnosis not present

## 2019-12-29 DIAGNOSIS — E039 Hypothyroidism, unspecified: Secondary | ICD-10-CM | POA: Diagnosis not present

## 2019-12-29 DIAGNOSIS — N179 Acute kidney failure, unspecified: Secondary | ICD-10-CM

## 2019-12-29 DIAGNOSIS — J1282 Pneumonia due to coronavirus disease 2019: Secondary | ICD-10-CM | POA: Diagnosis not present

## 2019-12-29 DIAGNOSIS — I5032 Chronic diastolic (congestive) heart failure: Secondary | ICD-10-CM | POA: Diagnosis not present

## 2019-12-29 LAB — POTASSIUM: Potassium: 5.9 mmol/L — ABNORMAL HIGH (ref 3.5–5.1)

## 2019-12-29 MED ORDER — HYDROCODONE-ACETAMINOPHEN 5-325 MG PO TABS: 1.0000 | ORAL_TABLET | Freq: Four times a day (QID) | ORAL | 0 refills | Status: DC | PRN

## 2019-12-29 NOTE — Progress Notes (Signed)
: Provider:  Hennie Duos., MD Location:  [facility] Room Number: 158-P Place of Service:  SNF ([phone])  PCP: Laurey Morale, MD Patient Care Team: Laurey Morale, MD as PCP - Avon Gully, MD (Nephrology) Minus Breeding, MD (Cardiology) Milus Banister, MD (Gastroenterology) Carolan Clines, MD (Inactive) as Consulting Physician (Urology) Nobie Putnam, MD as Consulting Physician (Hematology and Oncology)  Extended Emergency Contact Information Primary Emergency Contact: Rolm Baptise Address: [address] [place], [place] [address] [place] Litter of [place] Phone: [phone] Work Phone: [phone] Mobile Phone: [phone] Relation: Daughter Secondary Emergency Contact: Patty,John Address: [address]          [place], [place] [address] [place] of [place] Phone: [phone] Relation: Neighbor     Allergies: Penicillins  Chief Complaint  Patient presents with  . New Admit To SNF    New admission to [facility]    HPI: Patient is an 84 y.o. male diabetes mellitus type 2, atrial flutter, CAD, hyperlipidemia, hypothyroidism, AKI on end-stage renal disease status post DD KT in 2013, heart failure with preserved ejection fraction, hypertension, GERD, and sacral pressure ulcer who presented to [facility] ED after a fall.  Patient was admitted to [facility] from 1/28-3/4 where he was found to have no acute fractures or stroke or bleed probably need but did test positive for Covid on 2/13 and received treatment with remdesivir for 5 days Rocephin for 5 days and dexamethasone for 10 days.  Patient had worsening renal function with creatinine going from 4.05 on 2/13-5.48 on 2/17 which was attributed to his known failing transplanted kidney and Covid infection patient did receive some hemodialysis for a few sessions before deferring any further hemodialysis.  Nephrology saw patient in consult.  Patient is  admitted to skilled nursing facility for OT/PT for weakness.  While at skilled nursing facility patient will be followed for atrial fibrillation treated with metoprolol, amiodarone and Eliquis, hypertension treated with metoprolol Lasix, hypothyroidism treated with Synthroid.   Past Medical History:  Diagnosis Date  . Anemia 01/02/2012  . Anemia associated with chronic renal failure   . Aortic stenosis   . Atrial fibrillation ([place])   . CAD (coronary artery disease)   . CHF (congestive heart failure) ([place])    sees Dr. Glori Bickers   . Chronic kidney disease     sees Dr. Jamal Maes   . CVA (cerebral infarction) 05/2010  . Degenerative joint disease involving multiple joints 04/13/2007   Qualifier: Diagnosis of  By: Sarajane Jews MD, Ishmael Holter   . Diabetes mellitus   . Gout   . Hyperlipidemia   . Hypertension   . Myocardial infarction ([place])   . Obesity   . Shoulder pain, left    sees Dr. Nickola Major at [facility] in [place]   . Splenomegaly 2012  . Thrombocytopenia ([place]) 2011   sees Dr. Lamonte Sakai  . Thrombocytopenia ([place])     Past Surgical History:  Procedure Laterality Date  . AORTIC VALVE REPLACEMENT     Pericardial tissue valve  . APPENDECTOMY    . bone spurs     from right heel  . CARDIOVERSION N/A 01/13/2017   Procedure: CARDIOVERSION;  Surgeon: Larey Dresser, MD;  Location: [facility];  Service: Cardiovascular;  Laterality: N/A;  . CARDIOVERSION N/A 05/01/2017   Procedure: CARDIOVERSION;  Surgeon: Jolaine Artist, MD;  Location: [facility];  Service: Cardiovascular;  Laterality: N/A;  . CORONARY ARTERY BYPASS GRAFT     June 2 011 Limited the LAD, SVG to OM, SVG to PDA  . KIDNEY TRANSPLANT  01-20-12   at [facility] N/A 04/28/2017   Procedure: Right Heart Cath;  Surgeon: Jolaine Artist, MD;  Location: [facility] CV LAB;  Service: Cardiovascular;  Laterality: N/A;  . TEE WITHOUT CARDIOVERSION N/A 05/01/2017   Procedure:  TRANSESOPHAGEAL ECHOCARDIOGRAM (TEE);  Surgeon: Jolaine Artist, MD;  Location: [facility] ENDOSCOPY;  Service: Cardiovascular;  Laterality: N/A;    Allergies as of 12/29/2019      Reactions   Penicillins Hives   Pt was 84 years old  Has patient had a PCN reaction causing immediate rash, facial/tongue/throat swelling, SOB or lightheadedness with hypotension: Unknown Has patient had a PCN reaction causing severe rash involving mucus membranes or skin necrosis: Unknown Has patient had a PCN reaction that required hospitalization: No Has patient had a PCN reaction occurring within the last 10 years: No If all of the above answers are "NO", then may proceed with Cephalosporin use.      Medication List    Notice   This visit is during an admission. Changes to the med list made in this visit will be reflected in the After Visit Summary of the admission.    Current Outpatient Medications on File Prior to Visit  Medication Sig Dispense Refill  . allopurinol (ZYLOPRIM) 100 MG tablet Take 100 mg by mouth daily.    . Amino Acids-Protein Hydrolys (FEEDING SUPPLEMENT, PRO-STAT SUGAR FREE 64,) LIQD Take 30 mLs by mouth 3 (three) times daily with meals.    Marland Kitchen amiodarone (PACERONE) 200 MG tablet Take 1 tablet (200 mg total) by mouth daily. Please call for an office visit [phone] 30 tablet 3  . apixaban (ELIQUIS) 2.5 MG TABS tablet Take 1 tablet (2.5 mg total) by mouth 2 (two) times daily. 60 tablet 11  . aspirin 81 MG tablet Take 81 mg by mouth daily.    Marland Kitchen azelastine (ASTELIN) 0.1 % nasal spray Place 1 spray into both nostrils 2 (two) times daily. Use in each nostril as directed    . calcium carbonate (TUMS - DOSED IN MG ELEMENTAL CALCIUM) 500 MG chewable tablet Chew 2 tablets by mouth daily as needed. As needed for heartburn.    . collagenase (SANTYL) ointment Apply 1 application topically daily. Special Instructions: Apply to coccyx/left buttock wound per tx orders.    . collagenase (SANTYL) ointment  Apply 1 application topically daily. Special Instructions: Apply to wound right lateral foot per tx orders.    . diphenhydrAMINE HCl (ZZZQUIL) 50 MG/30ML LIQD Give 30 ml by mouth at bedtime for chronic insomnia    . docusate sodium (COLACE) 100 MG capsule Take 100 mg by mouth at bedtime.    . furosemide (LASIX) 40 MG tablet Take 40 mg by mouth daily.     Marland Kitchen HYDROcodone-acetaminophen (NORCO) 5-325 MG tablet Take 1 tablet by mouth every 6 (six) hours as needed for up to 7 days for moderate pain. 28 tablet 0  . insulin aspart (NOVOLOG FLEXPEN) 100 UNIT/ML FlexPen Insulin pen; 100 unit/mL (3 mL); amt: Per Sliding Scale;  If Blood Sugar is less than 60, call MD. If Blood Sugar is 150 to 200, give 2 Units. If Blood Sugar is 201 to 250, give 3 Units. If Blood Sugar is 251 to 300, give 5 Units. If Blood Sugar is 301 to 350, give  6 Units. If Blood Sugar is 351 to 400, give 7 Units. If Blood Sugar is greater than 400, give 8 Units. If Blood Sugar is greater than 400, call MD. subcutaneous  Three Times A Day    . insulin glargine (LANTUS SOLOSTAR) 100 UNIT/ML Solostar Pen Inject 7 Units into the skin at bedtime.    . iron polysaccharides (NIFEREX) 150 MG capsule Take 150 mg by mouth every other day.    Marland Kitchen KRILL OIL OMEGA-3 PO Megared superior krill oil with 90 mg omega 3 fatty acidscapsule; oral  Once An Evening 05:00 PM    . levothyroxine (SYNTHROID) 100 MCG tablet Take 100 mcg by mouth daily before breakfast.    . metoprolol tartrate (LOPRESSOR) 25 MG tablet Take 12.5 mg by mouth 2 (two) times daily. Give with a meal/food.     . mycophenolate (MYFORTIC) 180 MG EC tablet Take 1 tablet (180 mg total) by mouth 2 (two) times daily. 2 tablet 0  . NON FORMULARY Diet Change: Regular with dys 2 (ground) meats, continue thin liquids (NO STRAW); Con Carb    . Nutritional Supplements (JUVEN PO) Take 1 packet by mouth 2 (two) times daily between meals.    Marland Kitchen omeprazole (PRILOSEC) 20 MG capsule Take 20 mg by mouth  at bedtime.     . polyethylene glycol powder (GLYCOLAX/MIRALAX) powder Take 17 g by mouth daily.  0  . sevelamer carbonate (RENVELA) 800 MG tablet Take 800 mg by mouth 3 (three) times daily with meals. Give with meals.    . simvastatin (ZOCOR) 10 MG tablet Take 1 tablet (10 mg total) by mouth at bedtime. 1 tablet 0  . sulfamethoxazole-trimethoprim (BACTRIM,SEPTRA) 400-80 MG tablet Take 1 tablet by mouth every Monday, Wednesday, and Friday. Takes for kidney rejection  5  . tacrolimus (PROGRAF) 0.5 MG capsule Take 0.5 mg by mouth daily.    . tacrolimus (PROGRAF) 1 MG capsule Take 1 mg by mouth every evening.     . temazepam (RESTORIL) 30 MG capsule TAKE 1 CAPSULE BY MOUTH EVERYDAY AT BEDTIME 30 capsule 0   No current facility-administered medications on file prior to visit.     No orders of the defined types were placed in this encounter.   Immunization History  Administered Date(s) Administered  . Fluad Quad(high Dose 65+) 07/16/2019  . Hepatitis B 05/21/2010  . Influenza Split 07/30/2012, 07/21/2013  . Influenza Whole 08/24/2009, 07/17/2010  . Influenza, High Dose Seasonal PF 07/23/2016, 06/16/2017, 07/21/2018  . Influenza,inj,Quad PF,6+ Mos 07/20/2014  . Influenza-Unspecified 09/05/2015  . Pneumococcal Conjugate-13 09/05/2016  . Pneumococcal Polysaccharide-23 05/23/2010  . Tdap 09/23/2012    Social History   Tobacco Use  . Smoking status: Never Smoker  . Smokeless tobacco: Never Used  Substance Use Topics  . Alcohol use: No    Alcohol/week: 0.0 standard drinks    Family history is   Family History  Problem Relation Age of Onset  . Liver disease Father   . Cancer Unknown        colon/fhx  . Hypertension Unknown        fhx  . Coronary artery disease Unknown        fhx      Review of Systems  GENERAL:  no fevers, fatigue, appetite changes SKIN: No itching, or rash EYES: No eye pain, redness, discharge EARS: No earache, tinnitus, change in hearing NOSE: No  congestion, drainage or bleeding  MOUTH/THROAT: No mouth or tooth pain, No sore throat RESPIRATORY: No cough,  wheezing, SOB CARDIAC: No chest pain, palpitations, lower extremity edema  GI: No abdominal pain, No N/V/D or constipation, No heartburn or reflux  GU: No dysuria, frequency or urgency, or incontinence  MUSCULOSKELETAL: No unrelieved bone/joint pain NEUROLOGIC: No headache, dizziness or focal weakness PSYCHIATRIC: No c/o anxiety or sadness   Vitals:   12/29/19 1458  BP: (!) 119/56  Pulse: 79  Resp: 20  Temp: 98.3 F (36.8 C)    SpO2 Readings from Last 1 Encounters:  07/16/19 98%   Body mass index is 32.76 kg/m.     Physical Exam  GENERAL APPEARANCE: Alert, conversant,  No acute distress but very weak appearing.  SKIN: No diaphoresis rash HEAD: Normocephalic, atraumatic  EYES: Conjunctiva/lids clear. Pupils round, reactive. EOMs intact.  EARS: External exam WNL, canals clear. Hearing grossly normal.  NOSE: No deformity or discharge.  MOUTH/THROAT: Lips w/o lesions  RESPIRATORY: Breathing is even, unlabored. Lung sounds are clear   CARDIOVASCULAR: Heart RRR no murmurs, rubs or gallops. No peripheral edema.   GASTROINTESTINAL: Abdomen is soft, non-tender, not distended w/ normal bowel sounds. GENITOURINARY: Bladder non tender, not distended  MUSCULOSKELETAL: No abnormal joints or musculature NEUROLOGIC:  Cranial nerves 2-12 grossly intact. Moves all extremities  PSYCHIATRIC: Mood and affect appropriate to situation, no behavioral issues  Patient Active Problem List   Diagnosis Date Noted  . Hypertensive heart and kidney disease with chronic diastolic congestive heart failure and stage 5 chronic kidney disease not on chronic dialysis ([place]) 12/24/2019  . Dyslipidemia associated with type 2 diabetes mellitus ([place]) 12/24/2019  . CKD stage 5 due to type 2 diabetes mellitus ([place]) 12/24/2019  . Acquired hypothyroidism 12/24/2019  . Anemia due to stage 5 chronic kidney  disease ([place]) 12/24/2019  . Hypomagnesemia 12/24/2019  . Decubitus ulcer of buttock, unstageable ([place]) 12/24/2019  . Unstageable pressure ulcer of right foot ([place]) 12/24/2019  . GERD without esophagitis 12/24/2019  . Chronic constipation 12/24/2019  . CAD (coronary artery disease) of bypass graft 12/24/2019  . Primary insomnia 12/24/2019  . Paroxysmal atrial fibrillation ([place]) 03/01/2019  . Pulmonary HTN ([place]) 01/10/2017  . Hyperparathyroidism ([place]) 05/24/2016  . Hypogonadism in male 11/27/2015  . Renal transplant recipient 05/05/2012  . Thrombocytopenia ([place])   . S/P aortic valve replacement 12/04/2010  . Long term current use of anticoagulant 12/04/2010  . UNSPECIFIED HEPATITIS 12/26/2009  . Chronic non-seasonal allergic rhinitis 10/16/2009  . IRRITABLE BOWEL SYNDROME 10/16/2009  . AORTIC VALVE DISORDERS 05/02/2009  . GOUT 04/20/2008  . Congestive heart failure ([place]) 04/20/2008  . Type 2 diabetes mellitus with stage 5 chronic kidney disease and hypertension ([place]) 03/07/2008  . DEPRESSIVE DISORDER, RCR, MODERATE 06/17/2007  . INSUFFICIENCY, MITRAL/AORTIC VALVES 04/13/2007  . Hx of CABG 2011 04/13/2007  . Degenerative joint disease involving multiple joints 04/13/2007  . COLONIC POLYPS, BENIGN, HX OF 04/13/2007      Labs reviewed: Basic Metabolic Panel:    Component Value Date/Time   NA 130 (L) 12/27/2019 0729   NA 134 (A) 04/08/2017 0000   K 5.9 (H) 12/29/2019 1055   CL 94 (L) 12/27/2019 0729   CO2 27 12/27/2019 0729   GLUCOSE 146 (H) 12/27/2019 0729   GLUCOSE 185 (H) 10/28/2006 1046   BUN 86 (H) 12/27/2019 0729   BUN 41 (A) 06/16/2019 0000   CREATININE 4.61 (H) 12/27/2019 0729   CREATININE 2.06 (H) 01/20/2017 0849   CALCIUM 7.6 (L) 12/27/2019 0729   CALCIUM 7.9 (L) 03/23/2010 1414   PROT 6.5 12/27/2019 0729  ALBUMIN 2.8 (L) 12/27/2019 0729   AST 32 12/27/2019 0729   ALT 24 12/27/2019 0729   ALKPHOS 104 12/27/2019 0729   BILITOT 0.8 12/27/2019 0729   GFRNONAA  11 (L) 12/27/2019 0729   GFRAA 13 (L) 12/27/2019 0729    Recent Labs    06/16/19 0000 12/27/19 0729 12/29/19 1055  NA  --  130*  --   K 4.6 5.8* 5.9*  CL  --  94*  --   CO2  --  27  --   GLUCOSE  --  146*  --   BUN 41* 86*  --   CREATININE 2.7* 4.61*  --   CALCIUM  --  7.6*  --   MG  --  4.0*  --    Liver Function Tests: Recent Labs    06/16/19 0000 12/27/19 0729  AST 16 32  ALT 7* 24  ALKPHOS  --  104  BILITOT  --  0.8  PROT  --  6.5  ALBUMIN  --  2.8*   No results for input(s): LIPASE, AMYLASE in the last 8760 hours. No results for input(s): AMMONIA in the last 8760 hours. CBC: Recent Labs    06/16/19 0000 12/27/19 0729  WBC 7.4 6.0  HGB 10.4* 8.6*  HCT 31* 29.4*  MCV  --  98.3  PLT 128* 100*   Lipid Recent Labs    12/27/19 0729  CHOL 118  HDL 47  LDLCALC 52  TRIG 94    Cardiac Enzymes: No results for input(s): CKTOTAL, CKMB, CKMBINDEX, TROPONINI in the last 8760 hours. BNP: No results for input(s): BNP in the last 8760 hours. Lab Results  Component Value Date   MICROALBUR 15.1 (H) 12/07/2009   Lab Results  Component Value Date   HGBA1C 5.6 12/27/2019   Lab Results  Component Value Date   TSH 9.928 (H) 12/27/2019   Lab Results  Component Value Date   VITAMINB12 417 07/30/2011   Lab Results  Component Value Date   FOLATE  03/31/2010    >20.0 (NOTE)  Reference Ranges        Deficient:       0.4 - 3.3 ng/mL        Indeterminate:   3.4 - 5.4 ng/mL        Normal:              > 5.4 ng/mL   Lab Results  Component Value Date   IRON 41 06/16/2019   TIBC 280 05/09/2017   FERRITIN 376 (H) 05/09/2017    Imaging and Procedures obtained prior to SNF admission: No results found.   Not all labs, radiology exams or other studies done during hospitalization come through on my EPIC note; however they are reviewed by me.    Assessment and Plan  COVID-19 infection-positive on 2/13 and received remdesivir for 5 days, Rocephin for 5 days  dexamethasone for 10 days SNF-admitted for OT/PT  AKI on ESRD status post DD KT in 2013-patient with worsening renal function with a creatinine that peaked at 5.48; patient did have a few dialysis sessions but deferred any further dialysis; patient discharge creatinine is 3.1-3.2 creatinine SNF-start Renvela 100 mg tablet 3 times daily, Prograf 0.5 mg every morning and 1 mg nightly Myfortic 180 mg 2 times daily Bactrim 1 tablet 3 times weekly  Atrial fibrillation SNF-patient was started on Lopressor 12.5 mg twice daily replacing Coreg, continue amiodarone 200 mg daily and Eliquis 2.5 mg twice daily  Hypothyroidism SNF-not  stated as uncontrolled; continue Synthroid 75 mcg daily  GERD SNF-appears controlled; continue pro-Losec 20 mg daily   Full PPE was required for this visit Time spent greater than 45 minutes;> 50% of time with patient was spent reviewing records, labs, tests and studies, counseling and developing plan of care  Hennie Duos, MD

## 2019-12-29 NOTE — Progress Notes (Signed)
Location:    [facility] Room Number: 158/P Place of Service:  SNF (31)   CODE STATUS: Full Code  Allergies  Allergen Reactions  . Penicillins Hives    Pt was 84 years old  Has patient had a PCN reaction causing immediate rash, facial/tongue/throat swelling, SOB or lightheadedness with hypotension: Unknown Has patient had a PCN reaction causing severe rash involving mucus membranes or skin necrosis: Unknown Has patient had a PCN reaction that required hospitalization: No Has patient had a PCN reaction occurring within the last 10 years: No If all of the above answers are "NO", then may proceed with Cephalosporin use.     Chief Complaint  Patient presents with  . Medication Management    Medication Review     HPI:    Past Medical History:  Diagnosis Date  . Anemia 01/02/2012  . Anemia associated with chronic renal failure   . Aortic stenosis   . Atrial fibrillation ([place])   . CAD (coronary artery disease)   . CHF (congestive heart failure) ([place])    sees Dr. Glori Bickers   . Chronic kidney disease     sees Dr. Jamal Maes   . CVA (cerebral infarction) 05/2010  . Degenerative joint disease involving multiple joints 04/13/2007   Qualifier: Diagnosis of  By: Sarajane Jews MD, Ishmael Holter   . Diabetes mellitus   . Gout   . Hyperlipidemia   . Hypertension   . Myocardial infarction ([place])   . Obesity   . Shoulder pain, left    sees Dr. Nickola Major at [facility] in [place]   . Splenomegaly 2012  . Thrombocytopenia ([place]) 2011   sees Dr. Lamonte Sakai  . Thrombocytopenia ([place])     Past Surgical History:  Procedure Laterality Date  . AORTIC VALVE REPLACEMENT     Pericardial tissue valve  . APPENDECTOMY    . bone spurs     from right heel  . CARDIOVERSION N/A 01/13/2017   Procedure: CARDIOVERSION;  Surgeon: Larey Dresser, MD;  Location: [facility];  Service: Cardiovascular;  Laterality: N/A;  . CARDIOVERSION N/A 05/01/2017   Procedure:  CARDIOVERSION;  Surgeon: Jolaine Artist, MD;  Location: [facility] ENDOSCOPY;  Service: Cardiovascular;  Laterality: N/A;  . CORONARY ARTERY BYPASS GRAFT     June 2 011 Limited the LAD, SVG to OM, SVG to PDA  . KIDNEY TRANSPLANT  01-20-12   at [place] N/A 04/28/2017   Procedure: Right Heart Cath;  Surgeon: Jolaine Artist, MD;  Location: [place] CV LAB;  Service: Cardiovascular;  Laterality: N/A;  . TEE WITHOUT CARDIOVERSION N/A 05/01/2017   Procedure: TRANSESOPHAGEAL ECHOCARDIOGRAM (TEE);  Surgeon: Jolaine Artist, MD;  Location: [facility] ENDOSCOPY;  Service: Cardiovascular;  Laterality: N/A;    Social History   Socioeconomic History  . Marital status: Widowed    Spouse name: Not on file  . Number of children: Not on file  . Years of education: Not on file  . Highest education level: Not on file  Occupational History  . Not on file  Tobacco Use  . Smoking status: Never Smoker  . Smokeless tobacco: Never Used  Substance and Sexual Activity  . Alcohol use: No    Alcohol/week: 0.0 standard drinks  . Drug use: No  . Sexual activity: Not on file  Other Topics Concern  . Not on file  Social History Narrative  . Not on file   Social Determinants of Health  Financial Resource Strain:   . Difficulty of Paying Living Expenses: Not on file  Food Insecurity:   . Worried About Charity fundraiser in the Last Year: Not on file  . Ran Out of Food in the Last Year: Not on file  Transportation Needs:   . Lack of Transportation (Medical): Not on file  . Lack of Transportation (Non-Medical): Not on file  Physical Activity:   . Days of Exercise per Week: Not on file  . Minutes of Exercise per Session: Not on file  Stress:   . Feeling of Stress : Not on file  Social Connections:   . Frequency of Communication with Friends and Family: Not on file  . Frequency of Social Gatherings with Friends and Family: Not on file  . Attends Religious Services: Not on  file  . Active Member of Clubs or Organizations: Not on file  . Attends Archivist Meetings: Not on file  . Marital Status: Not on file  Intimate Partner Violence:   . Fear of Current or Ex-Partner: Not on file  . Emotionally Abused: Not on file  . Physically Abused: Not on file  . Sexually Abused: Not on file   Family History  Problem Relation Age of Onset  . Liver disease Father   . Cancer Unknown        colon/fhx  . Hypertension Unknown        fhx  . Coronary artery disease Unknown        fhx      VITAL SIGNS BP (!) 119/56   Pulse 79   Temp 98.3 F (36.8 C) (Oral)   Resp 20   Ht 5\' 11"  (1.803 m)   Wt 234 lb 14.4 oz (106.5 kg)   BMI 32.76 kg/m   Outpatient Encounter Medications as of 12/29/2019  Medication Sig  . allopurinol (ZYLOPRIM) 100 MG tablet Take 100 mg by mouth daily.  . Amino Acids-Protein Hydrolys (FEEDING SUPPLEMENT, PRO-STAT SUGAR FREE 64,) LIQD Take 30 mLs by mouth 3 (three) times daily with meals.  Marland Kitchen amiodarone (PACERONE) 200 MG tablet Take 1 tablet (200 mg total) by mouth daily. Please call for an office visit [phone]  . apixaban (ELIQUIS) 2.5 MG TABS tablet Take 1 tablet (2.5 mg total) by mouth 2 (two) times daily.  Marland Kitchen aspirin 81 MG tablet Take 81 mg by mouth daily.  Marland Kitchen azelastine (ASTELIN) 0.1 % nasal spray Place 1 spray into both nostrils 2 (two) times daily. Use in each nostril as directed  . calcium carbonate (TUMS - DOSED IN MG ELEMENTAL CALCIUM) 500 MG chewable tablet Chew 2 tablets by mouth daily as needed. As needed for heartburn.  . collagenase (SANTYL) ointment Apply 1 application topically daily. Special Instructions: Apply to coccyx/left buttock wound per tx orders.  . collagenase (SANTYL) ointment Apply 1 application topically daily. Special Instructions: Apply to wound right lateral foot per tx orders.  . diphenhydrAMINE HCl (ZZZQUIL) 50 MG/30ML LIQD Give 30 ml by mouth at bedtime for chronic insomnia  . docusate sodium  (COLACE) 100 MG capsule Take 100 mg by mouth at bedtime.  . furosemide (LASIX) 40 MG tablet Take 40 mg by mouth daily.   . insulin aspart (NOVOLOG FLEXPEN) 100 UNIT/ML FlexPen Insulin pen; 100 unit/mL (3 mL); amt: Per Sliding Scale;  If Blood Sugar is less than 60, call MD. If Blood Sugar is 150 to 200, give 2 Units. If Blood Sugar is 201 to 250, give 3 Units. If Blood  Sugar is 251 to 300, give 5 Units. If Blood Sugar is 301 to 350, give 6 Units. If Blood Sugar is 351 to 400, give 7 Units. If Blood Sugar is greater than 400, give 8 Units. If Blood Sugar is greater than 400, call MD. subcutaneous  Three Times A Day  . insulin glargine (LANTUS SOLOSTAR) 100 UNIT/ML Solostar Pen Inject 7 Units into the skin at bedtime.  . iron polysaccharides (NIFEREX) 150 MG capsule Take 150 mg by mouth every other day.  Marland Kitchen KRILL OIL OMEGA-3 PO Megared superior krill oil with 90 mg omega 3 fatty acidscapsule; oral  Once An Evening 05:00 PM  . levothyroxine (SYNTHROID) 100 MCG tablet Take 100 mcg by mouth daily before breakfast.  . metoprolol tartrate (LOPRESSOR) 25 MG tablet Take 12.5 mg by mouth 2 (two) times daily. Give with a meal/food.   . mycophenolate (MYFORTIC) 180 MG EC tablet Take 1 tablet (180 mg total) by mouth 2 (two) times daily.  . NON FORMULARY Diet: _____ Regular,  ______ NAS,  ___x____Consistent Carbohydrate,  _______NPO  _____Other  . omeprazole (PRILOSEC) 20 MG capsule Take 20 mg by mouth at bedtime.   . polyethylene glycol powder (GLYCOLAX/MIRALAX) powder Take 17 g by mouth daily.  . sevelamer carbonate (RENVELA) 800 MG tablet Take 800 mg by mouth 3 (three) times daily with meals. Give with meals.  . simvastatin (ZOCOR) 10 MG tablet Take 1 tablet (10 mg total) by mouth at bedtime.  . sulfamethoxazole-trimethoprim (BACTRIM,SEPTRA) 400-80 MG tablet Take 1 tablet by mouth every Monday, Wednesday, and Friday. Takes for kidney rejection  . tacrolimus (PROGRAF) 0.5 MG capsule Take 0.5 mg by  mouth daily.  . tacrolimus (PROGRAF) 1 MG capsule Take 1 mg by mouth every evening.   . temazepam (RESTORIL) 30 MG capsule TAKE 1 CAPSULE BY MOUTH EVERYDAY AT BEDTIME  . traMADol (ULTRAM) 50 MG tablet Take 2 tablets (100 mg total) by mouth every 6 (six) hours as needed for up to 7 days.   No facility-administered encounter medications on file as of 12/29/2019.     SIGNIFICANT DIAGNOSTIC EXAMS       ASSESSMENT/ PLAN:    MD is aware of resident's narcotic use and is in agreement with current plan of care. We will attempt to wean resident as appropriate.  Ok Edwards NP [facility] Adult Medicine  Contact [phone] Monday through Friday 8am- 5pm  After hours call [phone]   This encounter was created in error - please disregard.

## 2019-12-31 ENCOUNTER — Encounter: Payer: Self-pay | Admitting: Adult Health

## 2019-12-31 ENCOUNTER — Non-Acute Institutional Stay (SKILLED_NURSING_FACILITY): Payer: Medicare Other | Admitting: Adult Health

## 2019-12-31 DIAGNOSIS — N189 Chronic kidney disease, unspecified: Secondary | ICD-10-CM | POA: Diagnosis not present

## 2019-12-31 DIAGNOSIS — N185 Chronic kidney disease, stage 5: Secondary | ICD-10-CM

## 2019-12-31 DIAGNOSIS — D631 Anemia in chronic kidney disease: Secondary | ICD-10-CM | POA: Diagnosis not present

## 2019-12-31 DIAGNOSIS — I480 Paroxysmal atrial fibrillation: Secondary | ICD-10-CM

## 2019-12-31 DIAGNOSIS — I132 Hypertensive heart and chronic kidney disease with heart failure and with stage 5 chronic kidney disease, or end stage renal disease: Secondary | ICD-10-CM

## 2019-12-31 DIAGNOSIS — I5022 Chronic systolic (congestive) heart failure: Secondary | ICD-10-CM | POA: Diagnosis not present

## 2019-12-31 DIAGNOSIS — I272 Pulmonary hypertension, unspecified: Secondary | ICD-10-CM

## 2019-12-31 DIAGNOSIS — Z940 Kidney transplant status: Secondary | ICD-10-CM | POA: Diagnosis not present

## 2019-12-31 DIAGNOSIS — I5032 Chronic diastolic (congestive) heart failure: Secondary | ICD-10-CM

## 2019-12-31 NOTE — Progress Notes (Signed)
Location:    [facility] Room Number: 158/P Place of Service:  SNF (31)   CODE STATUS: Full Code  Allergies  Allergen Reactions  . Penicillins Hives    Pt was 84 years old  Has patient had a PCN reaction causing immediate rash, facial/tongue/throat swelling, SOB or lightheadedness with hypotension: Unknown Has patient had a PCN reaction causing severe rash involving mucus membranes or skin necrosis: Unknown Has patient had a PCN reaction that required hospitalization: No Has patient had a PCN reaction occurring within the last 10 years: No If all of the above answers are "NO", then may proceed with Cephalosporin use.     Chief Complaint  Patient presents with  . Medical Management of Chronic Issues        Chronic diastolic congestive heart failure:    Paroxysmal atrial fibrillation:  Hypertensive heart disease and kidney disease with chronic diastolic congestive heart failure stage 5 chronic kidney disease without dialysis/pulmonary HTN   Weekly follow up for the first 30 days post hospitalization.      HPI:  He is a 84 year old long term resident of this facility being seen for the management of his chronic illnesses: chf; afib; hypertensive heart disease. No reports of chest pain shortness of breath. He does have generalized edema. Present.   Past Medical History:  Diagnosis Date  . Anemia 01/02/2012  . Anemia associated with chronic renal failure   . Aortic stenosis   . Atrial fibrillation ([place])   . CAD (coronary artery disease)   . CHF (congestive heart failure) ([place])    sees Dr. Glori Bickers   . Chronic kidney disease     sees Dr. Jamal Maes   . CVA (cerebral infarction) 05/2010  . Degenerative joint disease involving multiple joints 04/13/2007   Qualifier: Diagnosis of  By: Sarajane Jews MD, Ishmael Holter   . Diabetes mellitus   . Gout   . Hyperlipidemia   . Hypertension   . Myocardial infarction ([place])   . Obesity   . Shoulder pain, left    sees  Dr. Nickola Major at [facility] in [place]   . Splenomegaly 2012  . Thrombocytopenia ([place]) 2011   sees Dr. Lamonte Sakai  . Thrombocytopenia ([place])     Past Surgical History:  Procedure Laterality Date  . AORTIC VALVE REPLACEMENT     Pericardial tissue valve  . APPENDECTOMY    . bone spurs     from right heel  . CARDIOVERSION N/A 01/13/2017   Procedure: CARDIOVERSION;  Surgeon: Larey Dresser, MD;  Location: [facility];  Service: Cardiovascular;  Laterality: N/A;  . CARDIOVERSION N/A 05/01/2017   Procedure: CARDIOVERSION;  Surgeon: Jolaine Artist, MD;  Location: [facility] ENDOSCOPY;  Service: Cardiovascular;  Laterality: N/A;  . CORONARY ARTERY BYPASS GRAFT     June 2 011 Limited the LAD, SVG to OM, SVG to PDA  . KIDNEY TRANSPLANT  01-20-12   at [place] N/A 04/28/2017   Procedure: Right Heart Cath;  Surgeon: Jolaine Artist, MD;  Location: [facility] CV LAB;  Service: Cardiovascular;  Laterality: N/A;  . TEE WITHOUT CARDIOVERSION N/A 05/01/2017   Procedure: TRANSESOPHAGEAL ECHOCARDIOGRAM (TEE);  Surgeon: Jolaine Artist, MD;  Location: [facility] ENDOSCOPY;  Service: Cardiovascular;  Laterality: N/A;    Social History   Socioeconomic History  . Marital status: Widowed    Spouse name: Not on file  . Number of children: Not on file  .  Years of education: Not on file  . Highest education level: Not on file  Occupational History  . Not on file  Tobacco Use  . Smoking status: Never Smoker  . Smokeless tobacco: Never Used  Substance and Sexual Activity  . Alcohol use: No    Alcohol/week: 0.0 standard drinks  . Drug use: No  . Sexual activity: Not on file  Other Topics Concern  . Not on file  Social History Narrative  . Not on file   Social Determinants of Health   Financial Resource Strain:   . Difficulty of Paying Living Expenses:   Food Insecurity:   . Worried About Charity fundraiser in the Last Year:   . Arboriculturist in the Last  Year:   Transportation Needs:   . Film/video editor (Medical):   Marland Kitchen Lack of Transportation (Non-Medical):   Physical Activity:   . Days of Exercise per Week:   . Minutes of Exercise per Session:   Stress:   . Feeling of Stress :   Social Connections:   . Frequency of Communication with Friends and Family:   . Frequency of Social Gatherings with Friends and Family:   . Attends Religious Services:   . Active Member of Clubs or Organizations:   . Attends Archivist Meetings:   Marland Kitchen Marital Status:   Intimate Partner Violence:   . Fear of Current or Ex-Partner:   . Emotionally Abused:   Marland Kitchen Physically Abused:   . Sexually Abused:    Family History  Problem Relation Age of Onset  . Liver disease Father   . Cancer Unknown        colon/fhx  . Hypertension Unknown        fhx  . Coronary artery disease Unknown        fhx      VITAL SIGNS BP 140/75   Pulse 80   Temp 98.2 F (36.8 C) (Oral)   Resp 20   Ht 5\' 11"  (1.803 m)   Wt 234 lb 14.4 oz (106.5 kg)   BMI 32.76 kg/m   Outpatient Encounter Medications as of 12/31/2019  Medication Sig  . allopurinol (ZYLOPRIM) 100 MG tablet Take 100 mg by mouth daily.  . Amino Acids-Protein Hydrolys (FEEDING SUPPLEMENT, PRO-STAT SUGAR FREE 64,) LIQD Take 30 mLs by mouth 3 (three) times daily with meals.  Marland Kitchen amiodarone (PACERONE) 200 MG tablet Take 1 tablet (200 mg total) by mouth daily. Please call for an office visit [phone]  . apixaban (ELIQUIS) 2.5 MG TABS tablet Take 1 tablet (2.5 mg total) by mouth 2 (two) times daily.  Marland Kitchen aspirin 81 MG tablet Take 81 mg by mouth daily.  Marland Kitchen azelastine (ASTELIN) 0.1 % nasal spray Place 1 spray into both nostrils 2 (two) times daily. Use in each nostril as directed  . calcium carbonate (TUMS - DOSED IN MG ELEMENTAL CALCIUM) 500 MG chewable tablet Chew 2 tablets by mouth daily as needed. As needed for heartburn.  . collagenase (SANTYL) ointment Apply 1 application topically daily. Special  Instructions: Apply to coccyx/left buttock wound per tx orders.  . collagenase (SANTYL) ointment Apply 1 application topically daily. Special Instructions: Apply to wound right lateral foot per tx orders.  . diphenhydrAMINE HCl (ZZZQUIL) 50 MG/30ML LIQD Give 30 ml by mouth at bedtime for chronic insomnia  . docusate sodium (COLACE) 100 MG capsule Take 100 mg by mouth at bedtime.  . furosemide (LASIX) 40 MG tablet Take 40 mg  by mouth daily.   Marland Kitchen HYDROcodone-acetaminophen (NORCO) 5-325 MG tablet Take 1 tablet by mouth every 6 (six) hours as needed for up to 7 days for moderate pain.  Marland Kitchen insulin aspart (NOVOLOG FLEXPEN) 100 UNIT/ML FlexPen Insulin pen; 100 unit/mL (3 mL); amt: Per Sliding Scale;  If Blood Sugar is less than 60, call MD. If Blood Sugar is 150 to 200, give 2 Units. If Blood Sugar is 201 to 250, give 3 Units. If Blood Sugar is 251 to 300, give 5 Units. If Blood Sugar is 301 to 350, give 6 Units. If Blood Sugar is 351 to 400, give 7 Units. If Blood Sugar is greater than 400, give 8 Units. If Blood Sugar is greater than 400, call MD. subcutaneous  Three Times A Day  . insulin glargine (LANTUS SOLOSTAR) 100 UNIT/ML Solostar Pen Inject 7 Units into the skin at bedtime.  . iron polysaccharides (NIFEREX) 150 MG capsule Take 150 mg by mouth every other day.  Marland Kitchen KRILL OIL OMEGA-3 PO Megared superior krill oil with 90 mg omega 3 fatty acidscapsule; oral  Once An Evening 05:00 PM  . levothyroxine (SYNTHROID) 100 MCG tablet Take 100 mcg by mouth daily before breakfast.  . metoprolol tartrate (LOPRESSOR) 25 MG tablet Take 12.5 mg by mouth 2 (two) times daily. Give with a meal/food.   . mycophenolate (MYFORTIC) 180 MG EC tablet Take 1 tablet (180 mg total) by mouth 2 (two) times daily.  . NON FORMULARY Diet Change: Regular with dys 2 (ground) meats, continue thin liquids (NO STRAW); Con Carb  . Nutritional Supplements (JUVEN PO) Take 1 packet by mouth 2 (two) times daily between meals.  Marland Kitchen  omeprazole (PRILOSEC) 20 MG capsule Take 20 mg by mouth at bedtime.   . polyethylene glycol powder (GLYCOLAX/MIRALAX) powder Take 17 g by mouth daily.  . sevelamer carbonate (RENVELA) 800 MG tablet Take 800 mg by mouth 3 (three) times daily with meals. Give with meals.  . simvastatin (ZOCOR) 10 MG tablet Take 1 tablet (10 mg total) by mouth at bedtime.  . sulfamethoxazole-trimethoprim (BACTRIM,SEPTRA) 400-80 MG tablet Take 1 tablet by mouth every Monday, Wednesday, and Friday. Takes for kidney rejection  . tacrolimus (PROGRAF) 0.5 MG capsule Take 0.5 mg by mouth daily.  . tacrolimus (PROGRAF) 1 MG capsule Take 1 mg by mouth every evening.   . temazepam (RESTORIL) 30 MG capsule TAKE 1 CAPSULE BY MOUTH EVERYDAY AT BEDTIME   No facility-administered encounter medications on file as of 12/31/2019.     SIGNIFICANT DIAGNOSTIC EXAMS   PREVIOUS  12-05-19: ct of chest abdomen and pelvis  1. No acute traumatic injury of the chest, abdomen, or pelvis. 2. Cardiomegaly with interstitial edema and bilateral pleural effusions, RIGHT greater than LEFT. 3. Consolidative airspace disease of the RIGHT lower lobe which likely reflects combination of atelectasis and pneumonia. 4. Borderline to enlarged mediastinal lymph nodes, likely reactive in the setting of volume overload and possible RIGHT lower lobe pneumonia. 5. Sequelae of volume overload with anasarca, ascites, and mesenteric edema. 6. Increased size of pancreatic head cyst, measuring up to 3.8 cm. Recommend nonemergent MRI.Marland Kitchen   NO NEW EXAMS.    LABS REVIEWED PREVIOUS;   12-08-19: urine culture: e-coli 12-20-19: wbc 6.8; hgb 8.2; hct 25.2 mcv 90.4 plt 95 01/11/2020: glucose 123 bun 67; creat 3.25; k+ 5.5; na++ 130; ca 7.9   TODAY  12-27-19: wbc 6.0; hgb 8.6; hct 29.4 mcv 98.3 plt 100; glucose 146; bun 86; creat 4.61; k+ 5.8; na++  130; ca 7.6 ;liver normal albumin 2.8; mg 4.0; hgb a1c 5.6; chol 118; ldl 52; trig 94 hdl 47; tsh 9.928 12-29-19: k+  5.9    Review of Systems  Constitutional: Negative for malaise/fatigue.  Respiratory: Negative for cough and shortness of breath.   Cardiovascular: Negative for chest pain, palpitations and leg swelling.  Gastrointestinal: Negative for abdominal pain, constipation and heartburn.  Musculoskeletal: Negative for back pain, joint pain and myalgias.  Skin: Negative.   Neurological: Negative for dizziness.  Psychiatric/Behavioral: The patient is not nervous/anxious.     Physical Exam Constitutional:      General: He is not in acute distress.    Appearance: He is well-developed. He is obese. He is not diaphoretic.  Neck:     Thyroid: No thyromegaly.  Cardiovascular:     Rate and Rhythm: Normal rate. Rhythm irregular.     Heart sounds: Normal heart sounds.     Comments: Unable to palpate pedal/post tib pulses  2/6 History of CABG and AVR Pulmonary:     Effort: Pulmonary effort is normal. No respiratory distress.     Breath sounds: Normal breath sounds.  Abdominal:     General: Bowel sounds are normal. There is no distension.     Palpations: Abdomen is soft.     Tenderness: There is no abdominal tenderness.  Genitourinary:    Comments: 2013: renal transplant Musculoskeletal:     Cervical back: Neck supple.     Right lower leg: Edema present.     Left lower leg: Edema present.     Comments: Has mild generalized edema Is able to move all extremities    Lymphadenopathy:     Cervical: No cervical adenopathy.  Skin:    General: Skin is warm and dry.     Comments: Coccyx/left buttock: unstaged: 4.0 x 5.0 x 1.0 cm 95% soft adherent eschar 5% granulation   Right lateral foot: unstaged: 1.5 x 1.8 cm: 100% eschar  Left lateral heel: DTI: 2.0 x 1.5 cm  Right posterior heel: DTI: 2.5 x 1.0 cm Has bruising present    Neurological:     Mental Status: He is alert and oriented to person, place, and time.  Psychiatric:        Mood and Affect: Mood normal.       ASSESSMENT/  PLAN:  TODAY  1. Chronic diastolic congestive heart failure: is stable will continue lasix 40 mg daily   2. Paroxysmal atrial fibrillation: heart rate stable: will continue amiodarone 200 mg daily lopressor 25 mg twice daily for rate control and eliquis 2.5 mg twice daily   3. Hypertensive heart disease and kidney disease with chronic diastolic congestive heart failure stage 5 chronic kidney disease without dialysis/pulmonary HTN./s/p aortic valve replacement:   Is stable will continue lopressor 25 mg twice daily asa 81 mg daily   PREVIOUS  4.  Gout due to renal impairment without tophi unspecified site: is stable will continue allopurinol 300 mg daily   5. Type 2 diabetes mellitus with stage 5 chronic kidney disease and hypertension: is stable will continue lantus 7 units nightly novolog SSI: 150-200: 2 units; 201-250: 3 units; 251-300: 5 units; 301-350 6 units; 351-400: 7 units >400 8 units. Is on asa statin  6. Dyslipidemia associated with type 2 diabetes mellitus is stable will continue zocor 10 mg daily   7. CKD stage 5 due to type 2 diabetes mellitus: is without change; bun 67 creat 3.25 will continue renvela 800 mg three times daily  8. Acquired hypothyroidism: is stable will continue synthroid 75 mcg daily   9. Renal transplant recipient: is without change: will continue myfortic 180 mgm twice daily and prograf 0.5 mg in the AM and 1 mg in the PM. Will continue septra DS three times weekly   10. Thrombocytopenia: plt 100 will monitor   11. Anemia due to CKD stage 5: is stable hgb 8.2 will continue niferex 150 mg every other day  12. Hypomagnesemia: is stable will continue mag ox 400 mg twice daily   13. Chronic non-seasonal allergic rhinitis: is stable will continue azelastine twice daily   14. Pressure injury of left buttock unstage pressure injury right foot: is stable will continue current treatment and will monitor  15. GERD without esophagitis: is stable will continue  prilosec 20 mg daily   16. Chronic constipation: is stable will continue miralax daily colace daily   17. Coronary artery disease of bypass graft of native heart with stable angina: is stable will continue asa 81 mg daily lopressor 25 mg twice daily   18. Primary osteoarthritis involving multiple joints: is stable will continue ultram 100 mg every 6 hours as needed through 12-31-19  19. Primary insomnia: is stable will continue restoril 30 mg nightly           MD is aware of resident's narcotic use and is in agreement with current plan of care. We will attempt to wean resident as appropriate.  Ok Edwards NP [facility] Adult Medicine  Contact [phone] Monday through Friday 8am- 5pm  After hours call [phone]

## 2020-01-01 ENCOUNTER — Encounter: Payer: Self-pay | Admitting: Internal Medicine

## 2020-01-01 DIAGNOSIS — N186 End stage renal disease: Secondary | ICD-10-CM | POA: Insufficient documentation

## 2020-01-01 DIAGNOSIS — U071 COVID-19: Secondary | ICD-10-CM | POA: Insufficient documentation

## 2020-01-03 ENCOUNTER — Other Ambulatory Visit: Payer: Self-pay | Admitting: Adult Health

## 2020-01-03 MED ORDER — HYDROCODONE-ACETAMINOPHEN 5-325 MG PO TABS: 1.0000 | ORAL_TABLET | Freq: Four times a day (QID) | ORAL | 0 refills | Status: DC

## 2020-01-04 ENCOUNTER — Non-Acute Institutional Stay (SKILLED_NURSING_FACILITY): Payer: Medicare Other | Admitting: Adult Health

## 2020-01-04 ENCOUNTER — Other Ambulatory Visit: Payer: Self-pay | Admitting: Adult Health

## 2020-01-04 ENCOUNTER — Other Ambulatory Visit: Payer: Self-pay | Admitting: *Deleted

## 2020-01-04 ENCOUNTER — Encounter: Payer: Self-pay | Admitting: Adult Health

## 2020-01-04 DIAGNOSIS — I132 Hypertensive heart and chronic kidney disease with heart failure and with stage 5 chronic kidney disease, or end stage renal disease: Secondary | ICD-10-CM | POA: Diagnosis not present

## 2020-01-04 DIAGNOSIS — L89320 Pressure ulcer of left buttock, unstageable: Secondary | ICD-10-CM

## 2020-01-04 DIAGNOSIS — N185 Chronic kidney disease, stage 5: Secondary | ICD-10-CM | POA: Diagnosis not present

## 2020-01-04 DIAGNOSIS — L89890 Pressure ulcer of other site, unstageable: Secondary | ICD-10-CM

## 2020-01-04 DIAGNOSIS — I5032 Chronic diastolic (congestive) heart failure: Secondary | ICD-10-CM

## 2020-01-04 DIAGNOSIS — E1122 Type 2 diabetes mellitus with diabetic chronic kidney disease: Secondary | ICD-10-CM | POA: Diagnosis not present

## 2020-01-04 MED ORDER — MORPHINE SULFATE (CONCENTRATE) 20 MG/ML PO SOLN: 5.0000 mg | ORAL | 0 refills | Status: DC | PRN

## 2020-01-04 MED ORDER — SANTYL 250 UNIT/GM EX OINT: 1.0000 "application " | TOPICAL_OINTMENT | Freq: Every day | CUTANEOUS | 0 refills | Status: AC

## 2020-01-04 MED ORDER — MORPHINE SULFATE (CONCENTRATE) 20 MG/ML PO SOLN: 5.0000 mg | ORAL | 0 refills | Status: AC | PRN

## 2020-01-04 MED ORDER — FUROSEMIDE 40 MG PO TABS: 40.0000 mg | ORAL_TABLET | Freq: Every day | ORAL | 0 refills | Status: AC

## 2020-01-04 MED ORDER — TEMAZEPAM 30 MG PO CAPS: ORAL_CAPSULE | ORAL | 0 refills | Status: AC

## 2020-01-04 NOTE — Patient Outreach (Signed)
Screened for potential [facility] Care Management needs as a benefit of  [employer].  Member is currently receiving skilled therapy at [facility].  Writer attended telephonic interdisciplinary team meeting to assess for disposition needs and transition plan for resident.   Facility reports family wants member to transition to home with [facility] upon SNF dc.   Currently receiving wound care.  Will continue to follow while residing in SNF.  Marthenia Rolling, MSN-Ed, RN,BSN [place] Acute Care Coordinator [phone] [facility]) [phone]  (Toll free office)

## 2020-01-04 NOTE — Progress Notes (Signed)
Location:    [facility] Room Number: 158/P Place of Service:  SNF (31)    CODE STATUS: DNR  Allergies  Allergen Reactions  . Penicillins Hives    Pt was 84 years old  Has patient had a PCN reaction causing immediate rash, facial/tongue/throat swelling, SOB or lightheadedness with hypotension: Unknown Has patient had a PCN reaction causing severe rash involving mucus membranes or skin necrosis: Unknown Has patient had a PCN reaction that required hospitalization: No Has patient had a PCN reaction occurring within the last 10 years: No If all of the above answers are "NO", then may proceed with Cephalosporin use.     Chief Complaint  Patient presents with  . Discharge Note    Discharge Visit    HPI:   He is being discharged to home with hospice care. He has endstage renal disease; failed transplant; with failure. His goal is to spend the end of his life at home. He will need a hospital bed; overbed table. He requires home 02 his sat on room air is 86 %. He will need his prescriptions written.    Past Medical History:  Diagnosis Date  . Anemia 01/02/2012  . Anemia associated with chronic renal failure   . Aortic stenosis   . Atrial fibrillation ([place])   . CAD (coronary artery disease)   . CHF (congestive heart failure) ([place])    sees Dr. Glori Bickers   . Chronic kidney disease     sees Dr. Jamal Maes   . CVA (cerebral infarction) 05/2010  . Degenerative joint disease involving multiple joints 04/13/2007   Qualifier: Diagnosis of  By: Sarajane Jews MD, Ishmael Holter   . Diabetes mellitus   . Gout   . Hyperlipidemia   . Hypertension   . Myocardial infarction ([place])   . Obesity   . Shoulder pain, left    sees Dr. Nickola Major at [facility] in [place]   . Splenomegaly 2012  . Thrombocytopenia ([place]) 2011   sees Dr. Lamonte Sakai  . Thrombocytopenia ([place])     Past Surgical History:  Procedure Laterality Date  . AORTIC VALVE REPLACEMENT     Pericardial tissue valve  . APPENDECTOMY    . bone spurs     from right heel  . CARDIOVERSION N/A 01/13/2017   Procedure: CARDIOVERSION;  Surgeon: Larey Dresser, MD;  Location: [facility];  Service: Cardiovascular;  Laterality: N/A;  . CARDIOVERSION N/A 05/01/2017   Procedure: CARDIOVERSION;  Surgeon: Jolaine Artist, MD;  Location: [facility] ENDOSCOPY;  Service: Cardiovascular;  Laterality: N/A;  . CORONARY ARTERY BYPASS GRAFT     June 2 011 Limited the LAD, SVG to OM, SVG to PDA  . KIDNEY TRANSPLANT  01-20-12   at [facility] N/A 04/28/2017   Procedure: Right Heart Cath;  Surgeon: Jolaine Artist, MD;  Location: [facility] CV LAB;  Service: Cardiovascular;  Laterality: N/A;  . TEE WITHOUT CARDIOVERSION N/A 05/01/2017   Procedure: TRANSESOPHAGEAL ECHOCARDIOGRAM (TEE);  Surgeon: Jolaine Artist, MD;  Location: [facility] ENDOSCOPY;  Service: Cardiovascular;  Laterality: N/A;    Social History   Socioeconomic History  . Marital status: Widowed    Spouse name: Not on file  . Number of children: Not on file  . Years of education: Not on file  . Highest education level: Not on file  Occupational History  . Not on file  Tobacco Use  . Smoking status: Never Smoker  . Smokeless  tobacco: Never Used  Substance and Sexual Activity  . Alcohol use: No    Alcohol/week: 0.0 standard drinks  . Drug use: No  . Sexual activity: Not on file  Other Topics Concern  . Not on file  Social History Narrative  . Not on file   Social Determinants of Health   Financial Resource Strain:   . Difficulty of Paying Living Expenses:   Food Insecurity:   . Worried About Charity fundraiser in the Last Year:   . Arboriculturist in the Last Year:   Transportation Needs:   . Film/video editor (Medical):   Marland Kitchen Lack of Transportation (Non-Medical):   Physical Activity:   . Days of Exercise per Week:   . Minutes of Exercise per Session:   Stress:   . Feeling of Stress :     Social Connections:   . Frequency of Communication with Friends and Family:   . Frequency of Social Gatherings with Friends and Family:   . Attends Religious Services:   . Active Member of Clubs or Organizations:   . Attends Archivist Meetings:   Marland Kitchen Marital Status:   Intimate Partner Violence:   . Fear of Current or Ex-Partner:   . Emotionally Abused:   Marland Kitchen Physically Abused:   . Sexually Abused:    Family History  Problem Relation Age of Onset  . Liver disease Father   . Cancer Unknown        colon/fhx  . Hypertension Unknown        fhx  . Coronary artery disease Unknown        fhx    VITAL SIGNS BP 118/74   Pulse 66   Temp (!) 97.1 F (36.2 C) (Oral)   Resp 18   Ht 5\' 11"  (1.803 m)   Wt 234 lb 14.4 oz (106.5 kg)   BMI 32.76 kg/m   Patient's Medications  New Prescriptions   No medications on file  Previous Medications   AMINO ACIDS-PROTEIN HYDROLYS (FEEDING SUPPLEMENT, PRO-STAT SUGAR FREE 64,) LIQD    Take 30 mLs by mouth 3 (three) times daily with meals.   COLLAGENASE (SANTYL) OINTMENT    Apply 1 application topically daily. Special Instructions: Apply to coccyx/left buttock wound per tx orders.   COLLAGENASE (SANTYL) OINTMENT    Apply 1 application topically daily. Special Instructions: Apply to wound right lateral foot per tx orders.   DIPHENHYDRAMINE HCL (ZZZQUIL) 50 MG/30ML LIQD    Give 30 ml by mouth at bedtime for chronic insomnia   DOCUSATE SODIUM (COLACE) 100 MG CAPSULE    Take 100 mg by mouth at bedtime.   FUROSEMIDE (LASIX) 40 MG TABLET    Take 40 mg by mouth daily.    MORPHINE (ROXANOL) 20 MG/ML CONCENTRATED SOLUTION    Solution; 100 mg/5 mL (20 mg/mL); amt: 5 mg; oral  Special Instructions: for pain or distress Every 4 Hours - PRN   NON FORMULARY    Diet Change: Dys 2 (Con Carb), continue thin liquids; will allow bacon and whole fruits/fruit cups   NUTRITIONAL SUPPLEMENTS (JUVEN PO)    Take 1 packet by mouth 2 (two) times daily between meals.    POLYETHYLENE GLYCOL POWDER (GLYCOLAX/MIRALAX) POWDER    Take 17 g by mouth daily.   TEMAZEPAM (RESTORIL) 30 MG CAPSULE    TAKE 1 CAPSULE BY MOUTH EVERYDAY AT BEDTIME  Modified Medications   No medications on file  Discontinued Medications  SIGNIFICANT DIAGNOSTIC EXAMS   PREVIOUS  12-05-19: ct of chest abdomen and pelvis  1. No acute traumatic injury of the chest, abdomen, or pelvis. 2. Cardiomegaly with interstitial edema and bilateral pleural effusions, RIGHT greater than LEFT. 3. Consolidative airspace disease of the RIGHT lower lobe which likely reflects combination of atelectasis and pneumonia. 4. Borderline to enlarged mediastinal lymph nodes, likely reactive in the setting of volume overload and possible RIGHT lower lobe pneumonia. 5. Sequelae of volume overload with anasarca, ascites, and mesenteric edema. 6. Increased size of pancreatic head cyst, measuring up to 3.8 cm. Recommend nonemergent MRI.Marland Kitchen   NO NEW EXAMS.    LABS REVIEWED PREVIOUS;   12-08-19: urine culture: e-coli 12-20-19: wbc 6.8; hgb 8.2; hct 25.2 mcv 90.4 plt 95 01/18/2020: glucose 123 bun 67; creat 3.25; k+ 5.5; na++ 130; ca 7.9  12-27-19: wbc 6.0; hgb 8.6; hct 29.4 mcv 98.3 plt 100; glucose 146; bun 86; creat 4.61; k+ 5.8; na++ 130; ca 7.6 ;liver normal albumin 2.8; mg 4.0; hgb a1c 5.6; chol 118; ldl 52; trig 94 hdl 47; tsh 9.928 12-29-19: k+ 5.9   NO NEW LABS.   Review of Systems  Constitutional: Negative for malaise/fatigue.  Respiratory: Negative for cough and shortness of breath.   Cardiovascular: Negative for chest pain, palpitations and leg swelling.  Gastrointestinal: Negative for abdominal pain, constipation and heartburn.  Musculoskeletal: Positive for joint pain and myalgias. Negative for back pain.  Skin: Negative.   Neurological: Negative for dizziness.  Psychiatric/Behavioral: The patient is not nervous/anxious.      Physical Exam Constitutional:      General: He is not in acute  distress.    Appearance: He is well-developed. He is not diaphoretic.  Neck:     Thyroid: No thyromegaly.  Cardiovascular:     Rate and Rhythm: Normal rate. Rhythm irregular.     Heart sounds: Normal heart sounds.     Comments:  Unable to palpate pedal/post tib pulses  2/6 History of CABG and AVR Pulmonary:     Effort: Pulmonary effort is normal. No respiratory distress.     Breath sounds: Normal breath sounds.  Abdominal:     General: Bowel sounds are normal. There is no distension.     Palpations: Abdomen is soft.     Tenderness: There is no abdominal tenderness.  Genitourinary:    Comments: 2013: renal transplant Musculoskeletal:     Cervical back: Neck supple.     Right lower leg: Edema present.     Left lower leg: Edema present.     Comments: Has mild generalized edema Is able to move all extremities     Lymphadenopathy:     Cervical: No cervical adenopathy.  Skin:    General: Skin is warm and dry.     Comments: Coccyx/left buttock: unstaged: 4.0 x 5.0 x 1.0 cm 95% soft adherent eschar 5% granulation   Right lateral foot: unstaged: 1.5 x 1.8 cm: 100% eschar  Left lateral heel: DTI: 2.0 x 1.5 cm  Right posterior heel: DTI: 2.5 x 1.0 cm Has bruising present  Neurological:     Mental Status: He is alert. Mental status is at baseline.  Psychiatric:        Mood and Affect: Mood normal.       ASSESSMENT/ PLAN:   Patient is being discharged with the following home health services:  None needed   Patient is being discharged with the following durable medical equipment:  Home 02 at 2L/[place]. 02 sat on  room air at rest 86%. Overbed table. Semi-electric bed with a therapeutic 5 zone mattress to allow him to maintain his 02 saturation which cannot bed done in a standard bed. He has a stage 4 sacral wound.   Patient has been advised to f/u with their PCP in 1-2 weeks to bring them up to date on their rehab stay.  Social services at facility was responsible for arranging this  appointment.  Pt was provided with a 30 day supply of prescriptions for medications and refills must be obtained from their PCP.  For controlled substances, a more limited supply may be provided adequate until PCP appointment only.   A 30 day supply of his prescription medications have been sent to [facility] [place] [id number].   Time spent with patient 40 minutes: dme medications.    Ok Edwards NP [facility]  Contact [phone] Monday through Friday 8am- 5pm  After hours call [phone]

## 2020-01-06 ENCOUNTER — Telehealth: Payer: Self-pay

## 2020-01-20 NOTE — Telephone Encounter (Signed)
Shanon Brow, pharmacist at [facility], called and stated that insurance would not cover the morphine unless there was an override for hospice/palliative care. I see in your discharge note that he was being discharged home with hospice care.

## 2020-01-20 NOTE — Telephone Encounter (Signed)
Called Bard Herbert and told her that pharmacy would not fill the morphine without a hospice override.  She stated the patient passed away today.  Called David at [facility] and let him know.

## 2020-01-20 DEATH — deceased

## 2020-05-18 NOTE — Telephone Encounter (Signed)
error
